# Patient Record
Sex: Female | Born: 1935 | Race: White | Hispanic: No | Smoking: Never smoker
Health system: Southern US, Community
[De-identification: ages and names within clinical notes are randomized; demographics above are authoritative.]

## PROBLEM LIST (undated history)

## (undated) DIAGNOSIS — M810 Age-related osteoporosis without current pathological fracture: Secondary | ICD-10-CM

## (undated) DIAGNOSIS — N83201 Unspecified ovarian cyst, right side: Secondary | ICD-10-CM

## (undated) DIAGNOSIS — I10 Essential (primary) hypertension: Secondary | ICD-10-CM

## (undated) DIAGNOSIS — Z8719 Personal history of other diseases of the digestive system: Secondary | ICD-10-CM

## (undated) DIAGNOSIS — E559 Vitamin D deficiency, unspecified: Secondary | ICD-10-CM

## (undated) DIAGNOSIS — M1990 Unspecified osteoarthritis, unspecified site: Secondary | ICD-10-CM

## (undated) DIAGNOSIS — K219 Gastro-esophageal reflux disease without esophagitis: Secondary | ICD-10-CM

## (undated) DIAGNOSIS — N200 Calculus of kidney: Secondary | ICD-10-CM

## (undated) DIAGNOSIS — R011 Cardiac murmur, unspecified: Secondary | ICD-10-CM

## (undated) DIAGNOSIS — D649 Anemia, unspecified: Secondary | ICD-10-CM

## (undated) DIAGNOSIS — I340 Nonrheumatic mitral (valve) insufficiency: Secondary | ICD-10-CM

## (undated) DIAGNOSIS — K5790 Diverticulosis of intestine, part unspecified, without perforation or abscess without bleeding: Secondary | ICD-10-CM

## (undated) DIAGNOSIS — E785 Hyperlipidemia, unspecified: Secondary | ICD-10-CM

## (undated) DIAGNOSIS — K5792 Diverticulitis of intestine, part unspecified, without perforation or abscess without bleeding: Secondary | ICD-10-CM

## (undated) DIAGNOSIS — R0602 Shortness of breath: Secondary | ICD-10-CM

## (undated) DIAGNOSIS — D132 Benign neoplasm of duodenum: Secondary | ICD-10-CM

## (undated) DIAGNOSIS — Z9289 Personal history of other medical treatment: Secondary | ICD-10-CM

## (undated) DIAGNOSIS — Z8711 Personal history of peptic ulcer disease: Secondary | ICD-10-CM

## (undated) HISTORY — DX: Vitamin D deficiency, unspecified: E55.9

## (undated) HISTORY — DX: Hyperlipidemia, unspecified: E78.5

## (undated) HISTORY — DX: Diverticulitis of intestine, part unspecified, without perforation or abscess without bleeding: K57.92

## (undated) HISTORY — DX: Essential (primary) hypertension: I10

## (undated) HISTORY — DX: Cardiac murmur, unspecified: R01.1

## (undated) HISTORY — DX: Personal history of other diseases of the digestive system: Z87.19

## (undated) HISTORY — DX: Gastro-esophageal reflux disease without esophagitis: K21.9

## (undated) HISTORY — DX: Personal history of peptic ulcer disease: Z87.11

## (undated) HISTORY — DX: Diverticulosis of intestine, part unspecified, without perforation or abscess without bleeding: K57.90

---

## 1979-12-22 HISTORY — PX: LEFT OOPHORECTOMY: SHX1961

## 1979-12-22 HISTORY — PX: ABDOMINAL HYSTERECTOMY: SHX81

## 2000-08-30 ENCOUNTER — Other Ambulatory Visit: Admission: RE | Admit: 2000-08-30 | Discharge: 2000-08-30 | Payer: Self-pay | Admitting: Obstetrics and Gynecology

## 2000-09-06 ENCOUNTER — Ambulatory Visit (HOSPITAL_COMMUNITY): Admission: RE | Admit: 2000-09-06 | Discharge: 2000-09-06 | Payer: Self-pay | Admitting: Obstetrics and Gynecology

## 2000-09-06 ENCOUNTER — Encounter: Payer: Self-pay | Admitting: Obstetrics and Gynecology

## 2005-10-06 ENCOUNTER — Ambulatory Visit: Payer: Self-pay | Admitting: Gastroenterology

## 2005-11-03 ENCOUNTER — Ambulatory Visit: Payer: Self-pay | Admitting: Gastroenterology

## 2005-11-09 ENCOUNTER — Ambulatory Visit: Payer: Self-pay | Admitting: Gastroenterology

## 2005-12-29 ENCOUNTER — Ambulatory Visit: Payer: Self-pay | Admitting: Gastroenterology

## 2006-01-11 ENCOUNTER — Ambulatory Visit: Payer: Self-pay | Admitting: Gastroenterology

## 2006-01-13 ENCOUNTER — Ambulatory Visit: Payer: Self-pay | Admitting: Cardiology

## 2009-03-19 ENCOUNTER — Telehealth: Payer: Self-pay | Admitting: Gastroenterology

## 2009-03-26 ENCOUNTER — Ambulatory Visit: Payer: Self-pay | Admitting: Gastroenterology

## 2009-03-26 DIAGNOSIS — R197 Diarrhea, unspecified: Secondary | ICD-10-CM

## 2009-03-26 DIAGNOSIS — Z87442 Personal history of urinary calculi: Secondary | ICD-10-CM | POA: Insufficient documentation

## 2009-03-26 DIAGNOSIS — K219 Gastro-esophageal reflux disease without esophagitis: Secondary | ICD-10-CM

## 2009-03-26 DIAGNOSIS — K5730 Diverticulosis of large intestine without perforation or abscess without bleeding: Secondary | ICD-10-CM | POA: Insufficient documentation

## 2009-03-26 LAB — CONVERTED CEMR LAB
ALT: 18 units/L (ref 0–35)
AST: 20 units/L (ref 0–37)
Albumin: 4.1 g/dL (ref 3.5–5.2)
Alkaline Phosphatase: 83 units/L (ref 39–117)
BUN: 8 mg/dL (ref 6–23)
Basophils Absolute: 0.1 10*3/uL (ref 0.0–0.1)
Basophils Relative: 0.9 % (ref 0.0–3.0)
Bilirubin, Direct: 0.1 mg/dL (ref 0.0–0.3)
CO2: 30 meq/L (ref 19–32)
Calcium: 8.9 mg/dL (ref 8.4–10.5)
Chloride: 109 meq/L (ref 96–112)
Creatinine, Ser: 0.5 mg/dL (ref 0.4–1.2)
Eosinophils Absolute: 0.2 10*3/uL (ref 0.0–0.7)
Eosinophils Relative: 2.8 % (ref 0.0–5.0)
Ferritin: 51.9 ng/mL (ref 10.0–291.0)
Folate: 20 ng/mL
GFR calc non Af Amer: 129.53 mL/min (ref >60)
Glucose, Bld: 99 mg/dL (ref 70–99)
HCT: 37.4 % (ref 36.0–46.0)
Hemoglobin: 12.9 g/dL (ref 12.0–15.0)
Iron: 85 ug/dL (ref 42–145)
Lymphocytes Relative: 24.5 % (ref 12.0–46.0)
Lymphs Abs: 1.4 10*3/uL (ref 0.7–4.0)
MCHC: 34.4 g/dL (ref 30.0–36.0)
MCV: 92.7 fL (ref 78.0–100.0)
Monocytes Absolute: 0.3 10*3/uL (ref 0.1–1.0)
Monocytes Relative: 5.3 % (ref 3.0–12.0)
Neutro Abs: 3.6 10*3/uL (ref 1.4–7.7)
Neutrophils Relative %: 66.5 % (ref 43.0–77.0)
Platelets: 303 10*3/uL (ref 150.0–400.0)
Potassium: 3.6 meq/L (ref 3.5–5.1)
RBC: 4.04 M/uL (ref 3.87–5.11)
RDW: 11.7 % (ref 11.5–14.6)
Saturation Ratios: 27.7 % (ref 20.0–50.0)
Sodium: 143 meq/L (ref 135–145)
TSH: 1.45 microintl units/mL (ref 0.35–5.50)
Total Bilirubin: 0.7 mg/dL (ref 0.3–1.2)
Total Protein: 7.1 g/dL (ref 6.0–8.3)
Transferrin: 219.4 mg/dL (ref 212.0–360.0)
Vitamin B-12: 402 pg/mL (ref 211–911)
WBC: 5.6 10*3/uL (ref 4.5–10.5)

## 2009-04-08 DIAGNOSIS — E559 Vitamin D deficiency, unspecified: Secondary | ICD-10-CM | POA: Insufficient documentation

## 2009-04-08 LAB — CONVERTED CEMR LAB
Tissue Transglutaminase Ab, IgA: 0.2 units (ref <7)
Vit D, 25-Hydroxy: 17 ng/mL — ABNORMAL LOW (ref 30–89)

## 2009-04-09 ENCOUNTER — Ambulatory Visit: Payer: Self-pay | Admitting: Gastroenterology

## 2009-04-10 ENCOUNTER — Encounter: Payer: Self-pay | Admitting: Gastroenterology

## 2009-06-25 ENCOUNTER — Telehealth: Payer: Self-pay | Admitting: Gastroenterology

## 2011-02-13 ENCOUNTER — Telehealth (INDEPENDENT_AMBULATORY_CARE_PROVIDER_SITE_OTHER): Payer: Self-pay | Admitting: *Deleted

## 2011-02-17 NOTE — Progress Notes (Signed)
  Phone Note Other Incoming   Request: Send information Summary of Call: Request for records received from [facility]. Records faxed to [phone]

## 2011-12-22 DIAGNOSIS — N83201 Unspecified ovarian cyst, right side: Secondary | ICD-10-CM

## 2011-12-22 HISTORY — DX: Unspecified ovarian cyst, right side: N83.201

## 2012-01-06 DIAGNOSIS — E785 Hyperlipidemia, unspecified: Secondary | ICD-10-CM | POA: Diagnosis not present

## 2012-01-06 DIAGNOSIS — I10 Essential (primary) hypertension: Secondary | ICD-10-CM | POA: Diagnosis not present

## 2012-04-01 DIAGNOSIS — Z0000 Encounter for general adult medical examination without abnormal findings: Secondary | ICD-10-CM | POA: Diagnosis not present

## 2012-04-01 DIAGNOSIS — I10 Essential (primary) hypertension: Secondary | ICD-10-CM | POA: Diagnosis not present

## 2012-04-01 DIAGNOSIS — E785 Hyperlipidemia, unspecified: Secondary | ICD-10-CM | POA: Diagnosis not present

## 2012-04-01 DIAGNOSIS — R350 Frequency of micturition: Secondary | ICD-10-CM | POA: Diagnosis not present

## 2012-05-26 DIAGNOSIS — D509 Iron deficiency anemia, unspecified: Secondary | ICD-10-CM | POA: Diagnosis not present

## 2012-05-26 DIAGNOSIS — Z01419 Encounter for gynecological examination (general) (routine) without abnormal findings: Secondary | ICD-10-CM | POA: Diagnosis not present

## 2012-05-26 DIAGNOSIS — Z0000 Encounter for general adult medical examination without abnormal findings: Secondary | ICD-10-CM | POA: Diagnosis not present

## 2012-06-10 DIAGNOSIS — Z1231 Encounter for screening mammogram for malignant neoplasm of breast: Secondary | ICD-10-CM | POA: Diagnosis not present

## 2012-06-14 DIAGNOSIS — R928 Other abnormal and inconclusive findings on diagnostic imaging of breast: Secondary | ICD-10-CM | POA: Diagnosis not present

## 2012-06-15 ENCOUNTER — Encounter: Payer: Self-pay | Admitting: *Deleted

## 2012-06-28 ENCOUNTER — Encounter: Payer: Self-pay | Admitting: Gastroenterology

## 2012-06-28 ENCOUNTER — Ambulatory Visit: Payer: Self-pay | Admitting: Gastroenterology

## 2012-07-21 ENCOUNTER — Encounter: Payer: Self-pay | Admitting: *Deleted

## 2012-07-22 DIAGNOSIS — I10 Essential (primary) hypertension: Secondary | ICD-10-CM | POA: Diagnosis not present

## 2012-07-22 DIAGNOSIS — E785 Hyperlipidemia, unspecified: Secondary | ICD-10-CM | POA: Diagnosis not present

## 2012-07-25 ENCOUNTER — Encounter: Payer: Self-pay | Admitting: Gastroenterology

## 2012-07-25 ENCOUNTER — Ambulatory Visit (INDEPENDENT_AMBULATORY_CARE_PROVIDER_SITE_OTHER): Payer: Medicare Other | Admitting: Gastroenterology

## 2012-07-25 VITALS — BP 140/74 | HR 72 | Ht 62.0 in | Wt 142.0 lb

## 2012-07-25 DIAGNOSIS — R195 Other fecal abnormalities: Secondary | ICD-10-CM

## 2012-07-25 DIAGNOSIS — K219 Gastro-esophageal reflux disease without esophagitis: Secondary | ICD-10-CM

## 2012-07-25 MED ORDER — INTEGRA F 125-1 MG PO CAPS: 1.0000 | ORAL_CAPSULE | Freq: Every day | ORAL | Status: DC

## 2012-07-25 MED ORDER — MOVIPREP 100 G PO SOLR: ORAL | Status: DC

## 2012-07-25 NOTE — Patient Instructions (Addendum)
You have been scheduled for an endoscopy and colonoscopy with propofol. Please follow the written instructions given to you at your visit today. Please pick up your prep at the pharmacy within the next 1-3 days. If you use inhalers (even only as needed), please bring them with you on the day of your procedure. We have sent the following medications to your pharmacy for you to pick up at your convenience: Integra (Iron) 1 capsule daily We will request labwork from Dr Ambrose Mantle CC: Dr Ambrose Mantle

## 2012-07-25 NOTE — Progress Notes (Signed)
History of Present Illness:  This is a very nice asymptomatic 76 year old Caucasian female referred through the courtesy of Dr. Tracey Harries for evaluation of guaiac positive stool on routine yearly exam. Apparently her hemoglobin was 9 consistent with iron deficiency. The patient denies melena, hematochezia, but has been on Prilosec for many years because of acid reflux. Currently she denies dyspepsia, burning substernal chest pain or dysphagia. Previous endoscopy and colonoscopy was negative in 2006 except for mild diverticulosis and a 5 cm hiatal hernia.. The patient is not on salicylates or NSAIDs. Family history is noncontributory.  I have reviewed this patient's present history, medical and surgical past history, allergies and medications.     ROS: The remainder of the 10 point ROS is negative     Physical Exam: Blood pressure 140/74, pulse 72 and regular, and weight 142 pounds with a BMI of 25.97. General well developed well nourished patient in no acute distress, appearing their stated age Eyes PERRLA, no icterus, fundoscopic exam per opthamologist Skin no lesions noted Neck supple, no adenopathy, no thyroid enlargement, no tenderness Chest clear to percussion and auscultation Heart no significant murmurs, gallops or rubs noted Abdomen no hepatosplenomegaly masses or tenderness, BS normal.  Extremities no acute joint lesions, edema, phlebitis or evidence of cellulitis. Neurologic patient oriented x 3, cranial nerves intact, no focal neurologic deficits noted. Psychological mental status normal and normal affect.  Assessment and plan: Current deficiency anemia with guaiac positive stool, rule out colonic polyposis and/or colon cancer versus occult upper GI lesion. I placed her on oral iron supplementation and we'll schedule outpatient endoscopy and colonoscopy with propofol sedation and nurse anesthesia.  No diagnosis found.

## 2012-07-29 ENCOUNTER — Encounter: Payer: Self-pay | Admitting: Gastroenterology

## 2012-07-29 ENCOUNTER — Ambulatory Visit (AMBULATORY_SURGERY_CENTER): Payer: Medicare Other | Admitting: Gastroenterology

## 2012-07-29 VITALS — BP 136/65 | HR 75 | Temp 96.6°F | Resp 18 | Ht 61.0 in | Wt 139.0 lb

## 2012-07-29 DIAGNOSIS — Z1211 Encounter for screening for malignant neoplasm of colon: Secondary | ICD-10-CM | POA: Diagnosis not present

## 2012-07-29 DIAGNOSIS — I10 Essential (primary) hypertension: Secondary | ICD-10-CM | POA: Diagnosis not present

## 2012-07-29 DIAGNOSIS — K219 Gastro-esophageal reflux disease without esophagitis: Secondary | ICD-10-CM

## 2012-07-29 DIAGNOSIS — R195 Other fecal abnormalities: Secondary | ICD-10-CM

## 2012-07-29 DIAGNOSIS — K5730 Diverticulosis of large intestine without perforation or abscess without bleeding: Secondary | ICD-10-CM | POA: Diagnosis not present

## 2012-07-29 DIAGNOSIS — D509 Iron deficiency anemia, unspecified: Secondary | ICD-10-CM

## 2012-07-29 DIAGNOSIS — K5732 Diverticulitis of large intestine without perforation or abscess without bleeding: Secondary | ICD-10-CM | POA: Diagnosis not present

## 2012-07-29 MED ORDER — SODIUM CHLORIDE 0.9 % IV SOLN: 500.0000 mL | INTRAVENOUS | Status: DC

## 2012-07-29 NOTE — Progress Notes (Signed)
Patient did not experience any of the following events: a burn prior to discharge; a fall within the facility; wrong site/side/patient/procedure/implant event; or a hospital transfer or hospital admission upon discharge from the facility. (G8907) Patient did not have preoperative order for IV antibiotic SSI prophylaxis. (G8918)  

## 2012-07-29 NOTE — Op Note (Signed)
[facility] [address] [place], [place]  [address]  COLONOSCOPY PROCEDURE REPORT  PATIENT:  Christine, Morse  MR#:  [id number] BIRTHDATE:  19-May-1936, 75 yrs. old  GENDER:  female ENDOSCOPIST:  Vania Rea. Jarold Motto, MD, [facility] REF. BY: PROCEDURE DATE:  07/29/2012 PROCEDURE:  Average-risk screening colonoscopy G0121 ASA CLASS:  Class II INDICATIONS:  Iron deficiency anemia MEDICATIONS:   propofol (Diprivan) 120 mg IV  DESCRIPTION OF PROCEDURE:   After the risks and benefits and of the procedure were explained, informed consent was obtained. Digital rectal exam was performed and revealed no abnormalities and external hemorrhoids.   The LB CF-[id number] endoscope was introduced through the anus and advanced to the cecum, which was identified by both the appendix and ileocecal valve.  The quality of the prep was excellent, using MoviPrep.  The instrument was then slowly withdrawn as the colon was fully examined.<<PROCEDUREIMAGES>>  FINDINGS:  There were mild diverticular changes in left colon. diverticulosis was found.  External Hemorrhoids were found.  No polyps or cancers were seen.  This was otherwise a normal examination of the colon.   Retroflexed views in the rectum revealed no abnormalities.    The scope was then withdrawn from the patient and the procedure completed.  COMPLICATIONS:  None ENDOSCOPIC IMPRESSION: 1) Diverticulosis,mild,left sided diverticulosis 2) External hemorrhoids 3) No polyps or cancers 4) Otherwise normal examination RECOMMENDATIONS: 1) Capsule endoscopy PATIENT COULD NOT TOLERATE ENDOSCOPY PER SEVERE UPPER AIRWAY SPASM.  REPEAT EXAM:  No  ______________________________ Vania Rea. Jarold Motto, MD, [facility]  CC:  n. eSIGNED:   Vania Rea. Royalty Domagala at 07/29/2012 03:20 PM  [place], [id number]

## 2012-07-29 NOTE — Patient Instructions (Addendum)
YOU HAD AN ENDOSCOPIC PROCEDURE TODAY AT THE [facility]: Refer to the procedure report that was given to you for any specific questions about what was found during the examination.  If the procedure report does not answer your questions, please call your gastroenterologist to clarify.  If you requested that your care partner not be given the details of your procedure findings, then the procedure report has been included in a sealed envelope for you to review at your convenience later.  YOU SHOULD EXPECT: Some feelings of bloating in the abdomen. Passage of more gas than usual.  Walking can help get rid of the air that was put into your GI tract during the procedure and reduce the bloating. If you had a lower endoscopy (such as a colonoscopy or flexible sigmoidoscopy) you may notice spotting of blood in your stool or on the toilet paper. If you underwent a bowel prep for your procedure, then you may not have a normal bowel movement for a few days.  DIET: Your first meal following the procedure should be a light meal and then it is ok to progress to your normal diet.  A half-sandwich or bowl of soup is an example of a good first meal.  Heavy or fried foods are harder to digest and may make you feel nauseous or bloated.  Likewise meals heavy in dairy and vegetables can cause extra gas to form and this can also increase the bloating.  Drink plenty of fluids but you should avoid alcoholic beverages for 24 hours.  ACTIVITY: Your care partner should take you home directly after the procedure.  You should plan to take it easy, moving slowly for the rest of the day.  You can resume normal activity the day after the procedure however you should NOT DRIVE or use heavy machinery for 24 hours (because of the sedation medicines used during the test).    SYMPTOMS TO REPORT IMMEDIATELY: A gastroenterologist can be reached at any hour.  During normal business hours, 8:30 AM to 5:00 PM Monday through Friday,  call [phone].  After hours and on weekends, please call the GI answering service at [phone] who will take a message and have the physician on call contact you.   Following lower endoscopy (colonoscopy or flexible sigmoidoscopy):  Excessive amounts of blood in the stool  Significant tenderness or worsening of abdominal pains  Swelling of the abdomen that is new, acute  Fever of 100F or higher   FOLLOW UP: Our staff will call the home number listed on your records the next business day following your procedure to check on you and address any questions or concerns that you may have at that time regarding the information given to you following your procedure. This is a courtesy call and so if there is no answer at the home number and we have not heard from you through the emergency physician on call, we will assume that you have returned to your regular daily activities without incident.  SIGNATURES/CONFIDENTIALITY: You and/or your care partner have signed paperwork which will be entered into your electronic medical record.  These signatures attest to the fact that that the information above on your After Visit Summary has been reviewed and is understood.  Full responsibility of the confidentiality of this discharge information lies with you and/or your care-partner.   Dr. Norval Gable nurse will call you next week to set up the endoscopy

## 2012-08-01 ENCOUNTER — Telehealth: Payer: Self-pay | Admitting: *Deleted

## 2012-08-01 NOTE — Telephone Encounter (Signed)
  Follow up Call-  Call back number 07/29/2012  Post procedure Call Back phone  # [phone]  Permission to leave phone message Yes     Patient questions:  Do you have a fever, pain , or abdominal swelling? no Pain Score  0 *  Have you tolerated food without any problems? yes  Have you been able to return to your normal activities? yes  Do you have any questions about your discharge instructions: Diet   no Medications  no Follow up visit  no  Do you have questions or concerns about your Care? no  Actions: * If pain score is 4 or above: No action needed, pain <4.

## 2012-08-02 ENCOUNTER — Telehealth: Payer: Self-pay | Admitting: *Deleted

## 2012-08-02 ENCOUNTER — Other Ambulatory Visit: Payer: Self-pay | Admitting: *Deleted

## 2012-08-02 DIAGNOSIS — D509 Iron deficiency anemia, unspecified: Secondary | ICD-10-CM

## 2012-08-02 NOTE — Telephone Encounter (Signed)
Spoke with pt to schedule her for Capsule Endo teaching. Explained it might be next week before we get more test supplies; pt stated understanding and will come in 08/05/12.

## 2012-08-05 ENCOUNTER — Telehealth: Payer: Self-pay

## 2012-08-05 NOTE — Telephone Encounter (Signed)
Pt is scheduled for capsule endo 08/10/12. Pt takes iron supplements, do you want her to stop this prior to the capsule endo? Please advise.

## 2012-08-05 NOTE — Telephone Encounter (Signed)
YES

## 2012-08-05 NOTE — Telephone Encounter (Signed)
Spoke with pt and she knows to stop her Iron for the capsule endoscopy.

## 2012-08-10 ENCOUNTER — Ambulatory Visit (INDEPENDENT_AMBULATORY_CARE_PROVIDER_SITE_OTHER): Payer: Medicare Other | Admitting: Internal Medicine

## 2012-08-10 DIAGNOSIS — K921 Melena: Secondary | ICD-10-CM

## 2012-08-10 DIAGNOSIS — D508 Other iron deficiency anemias: Secondary | ICD-10-CM | POA: Diagnosis not present

## 2012-08-10 NOTE — Progress Notes (Signed)
Pt in at 0800am this morning and she stated she had completed the prep, had BMs and has been NPO since midnight. She swallowed the pill w/o difficulty, lay on her side for 30 minutes with no distress per pt. She was given instructions for diet with times throughout today, restrictions our number for questions or problems, etc and she stated understanding. Pt back at 1610pm and capsule belt and module removed from pt. Pt reported no problems and was told we hoped to have results in a couple of weeks and she stated understanding. Capsule LOT  [id number]      25

## 2012-08-18 ENCOUNTER — Encounter: Payer: Self-pay | Admitting: Internal Medicine

## 2012-10-26 DIAGNOSIS — E039 Hypothyroidism, unspecified: Secondary | ICD-10-CM | POA: Diagnosis not present

## 2012-10-26 DIAGNOSIS — E559 Vitamin D deficiency, unspecified: Secondary | ICD-10-CM | POA: Diagnosis not present

## 2012-10-26 DIAGNOSIS — Z6826 Body mass index (BMI) 26.0-26.9, adult: Secondary | ICD-10-CM | POA: Diagnosis not present

## 2012-10-26 DIAGNOSIS — E785 Hyperlipidemia, unspecified: Secondary | ICD-10-CM | POA: Diagnosis not present

## 2012-10-26 DIAGNOSIS — H40009 Preglaucoma, unspecified, unspecified eye: Secondary | ICD-10-CM | POA: Diagnosis not present

## 2012-10-26 DIAGNOSIS — R5383 Other fatigue: Secondary | ICD-10-CM | POA: Diagnosis not present

## 2013-01-27 DIAGNOSIS — E785 Hyperlipidemia, unspecified: Secondary | ICD-10-CM | POA: Diagnosis not present

## 2013-01-27 DIAGNOSIS — I10 Essential (primary) hypertension: Secondary | ICD-10-CM | POA: Diagnosis not present

## 2013-01-27 DIAGNOSIS — M810 Age-related osteoporosis without current pathological fracture: Secondary | ICD-10-CM | POA: Diagnosis not present

## 2013-01-27 DIAGNOSIS — R0602 Shortness of breath: Secondary | ICD-10-CM | POA: Diagnosis not present

## 2013-02-22 DIAGNOSIS — I10 Essential (primary) hypertension: Secondary | ICD-10-CM | POA: Diagnosis not present

## 2013-02-22 DIAGNOSIS — E785 Hyperlipidemia, unspecified: Secondary | ICD-10-CM | POA: Diagnosis not present

## 2013-02-22 DIAGNOSIS — M810 Age-related osteoporosis without current pathological fracture: Secondary | ICD-10-CM | POA: Diagnosis not present

## 2013-05-01 DIAGNOSIS — R42 Dizziness and giddiness: Secondary | ICD-10-CM | POA: Diagnosis not present

## 2013-05-01 DIAGNOSIS — Z0000 Encounter for general adult medical examination without abnormal findings: Secondary | ICD-10-CM | POA: Diagnosis not present

## 2013-05-01 DIAGNOSIS — Z9181 History of falling: Secondary | ICD-10-CM | POA: Diagnosis not present

## 2013-06-16 DIAGNOSIS — Z1231 Encounter for screening mammogram for malignant neoplasm of breast: Secondary | ICD-10-CM | POA: Diagnosis not present

## 2013-08-02 DIAGNOSIS — Z6825 Body mass index (BMI) 25.0-25.9, adult: Secondary | ICD-10-CM | POA: Diagnosis not present

## 2013-08-02 DIAGNOSIS — E785 Hyperlipidemia, unspecified: Secondary | ICD-10-CM | POA: Diagnosis not present

## 2013-08-02 DIAGNOSIS — I10 Essential (primary) hypertension: Secondary | ICD-10-CM | POA: Diagnosis not present

## 2013-08-02 DIAGNOSIS — M810 Age-related osteoporosis without current pathological fracture: Secondary | ICD-10-CM | POA: Diagnosis not present

## 2013-08-10 DIAGNOSIS — N390 Urinary tract infection, site not specified: Secondary | ICD-10-CM | POA: Diagnosis not present

## 2013-08-10 DIAGNOSIS — M539 Dorsopathy, unspecified: Secondary | ICD-10-CM | POA: Diagnosis not present

## 2013-08-10 DIAGNOSIS — M25519 Pain in unspecified shoulder: Secondary | ICD-10-CM | POA: Diagnosis not present

## 2013-08-11 DIAGNOSIS — M25519 Pain in unspecified shoulder: Secondary | ICD-10-CM | POA: Diagnosis not present

## 2013-08-11 DIAGNOSIS — M542 Cervicalgia: Secondary | ICD-10-CM | POA: Diagnosis not present

## 2013-08-11 DIAGNOSIS — M4802 Spinal stenosis, cervical region: Secondary | ICD-10-CM | POA: Diagnosis not present

## 2013-08-11 DIAGNOSIS — M47812 Spondylosis without myelopathy or radiculopathy, cervical region: Secondary | ICD-10-CM | POA: Diagnosis not present

## 2013-08-17 DIAGNOSIS — R351 Nocturia: Secondary | ICD-10-CM | POA: Diagnosis not present

## 2013-08-17 DIAGNOSIS — M542 Cervicalgia: Secondary | ICD-10-CM | POA: Diagnosis not present

## 2013-08-17 DIAGNOSIS — N3090 Cystitis, unspecified without hematuria: Secondary | ICD-10-CM | POA: Diagnosis not present

## 2013-08-17 DIAGNOSIS — R350 Frequency of micturition: Secondary | ICD-10-CM | POA: Diagnosis not present

## 2013-08-17 DIAGNOSIS — N390 Urinary tract infection, site not specified: Secondary | ICD-10-CM | POA: Diagnosis not present

## 2013-08-17 DIAGNOSIS — M4120 Other idiopathic scoliosis, site unspecified: Secondary | ICD-10-CM | POA: Diagnosis not present

## 2013-08-23 ENCOUNTER — Ambulatory Visit (INDEPENDENT_AMBULATORY_CARE_PROVIDER_SITE_OTHER): Payer: Medicare Other | Admitting: Cardiology

## 2013-08-23 ENCOUNTER — Encounter: Payer: Self-pay | Admitting: Cardiology

## 2013-08-23 VITALS — BP 136/60 | HR 92 | Ht 61.5 in | Wt 134.6 lb

## 2013-08-23 DIAGNOSIS — R0789 Other chest pain: Secondary | ICD-10-CM | POA: Diagnosis not present

## 2013-08-23 DIAGNOSIS — R0602 Shortness of breath: Secondary | ICD-10-CM

## 2013-08-23 MED ORDER — ASPIRIN EC 81 MG PO TBEC: 81.0000 mg | DELAYED_RELEASE_TABLET | Freq: Every day | ORAL | Status: DC

## 2013-08-23 NOTE — Patient Instructions (Addendum)
The current medical regimen is effective;  continue present plan and medications.  Your physician has requested that you have an echocardiogram. Echocardiography is a painless test that uses sound waves to create images of your heart. It provides your doctor with information about the size and shape of your heart and how well your heart's chambers and valves are working. This procedure takes approximately one hour. There are no restrictions for this procedure.  Please have blood work today. (BNP)  Follow up in 6 weeks with Dr Antoine Poche.

## 2013-08-23 NOTE — Progress Notes (Signed)
HPI Christine Morse is a 77 year old new patient who presents for evaluation of dyspnea on exertion.  She has come to me today on self-referral as I take care of her husband as well.  She reports she has been having significant dyspnea on exertion having to stop after 15 steps, less than 25 yards of walking on a slight incline, and with many daily activities.  This is been going on for the past 6-8 months but seems significantly worsened over the past 2.  She has never had anything like this before.  She does not have any associated chest pain but does report a significant chest pressure over her upper chest bilaterally radiating to her bilateral anterior shoulders.  She will occasionally have diaphoresis with these episodes but denies any nausea or vomiting; denys any dizziness or orthostasis.  She reports the pain will occasionally radiate into her bilateral jaws and neck.  She has no pain radiating down her arms.  Rest is the only thing that seems to alleviate her symptoms.  She has not tried any medications.   She's never had any prior cardiac history. She has hypertension and  hyperlipidemia but no history of diabetes.  She is not on a statin but does take a daily aspirin.   No Known Allergies  Current Outpatient Prescriptions  Medication Sig Dispense Refill  . alendronate (FOSAMAX) 70 MG tablet Take 70 mg by mouth every 7 (seven) days. Take with a full glass of water on an empty stomach.      . Calcium Carbonate-Vitamin D (CALCIUM + D PO) Take 1 capsule by mouth daily.      Marland Kitchen etodolac (LODINE) 400 MG tablet Take 400 mg by mouth 2 (two) times daily.      . fenofibrate 54 MG tablet Take 54 mg by mouth daily.      Marland Kitchen lisinopril (PRINIVIL,ZESTRIL) 5 MG tablet Take 5 mg by mouth daily. Take 3 tablets daily      . meloxicam (MOBIC) 15 MG tablet Take 15 mg by mouth daily.      . Omeprazole (PRILOSEC PO) Take 1 tablet by mouth daily.      Marland Kitchen oxybutynin (DITROPAN-XL) 5 MG 24 hr tablet Take 5 mg by mouth  daily.       No current facility-administered medications for this visit.    Past Medical History  Diagnosis Date  . Diverticulosis   . History of nephrolithiasis   . Diverticulitis   . Hypertension   . Hyperlipidemia   . GERD (gastroesophageal reflux disease)     Past Surgical History  Procedure Laterality Date  . Abdominal hysterectomy    . Left oophorectomy      ROS: otherwise per history of present illness PHYSICAL EXAM GENERAL:  Elderly Caucasian female,nicely dressed and able to participate in exam fully HEENT:  Pupils equal round and reactive, fundi not visualized, oral mucosa unremarkable NECK:  No jugular venous distention, waveform within normal limits, carotid upstroke brisk and symmetric, no bruits but murmur that radiates as below, no thyromegaly LYMPHATICS:  No cervical, inguinal adenopathy LUNGS:  Clear to auscultation bilaterally CHEST:  Unremarkable HEART:  PMI not displaced or sustained, S1 and S2 within normal limits, no S3, no S4, no clicks, no rubs, 2-3/6 systolic ejection murmur heard best at the right upper sternal border.  It is not affected by Valsalva, or leaning forward.  ABD:  Flat, positive bowel sounds normal in frequency in pitch, no bruits, no rebound, no guarding, no midline pulsatile  mass, no hepatomegaly, no splenomegaly EXT:  2 plus pulses throughout, no edema, no cyanosis no clubbing, diffuse muscle wasting  BP 136/60  Pulse 92  Ht 5' 1.5" (1.562 m)  Wt 134 lb 9.6 oz (61.054 kg)  BMI 25.02 kg/m2  EKG: NSR @ 92, incomplete RBBB, no ischemia  ASSESSMENT AND PLAN  Dyspnea on Exertion with Murmur, likely Aortic Stenosis:  Risk stratification: TSH, proBNP, Lipid profile, A1c Needs ECHO.  If mild or moderate aortic stenosis will need further ischemic evaluation; if severe or critical symptoms likely due to AS.  Will make further treatment decision following results of echo Continue daily ASA 81mg   HTN -  Well controlled today.  Continue  lisinopril  Hyperlipidemia - Check lipid profile.  Will probably need to be on statin given likely elevated ASCVD risk.    History and all data above reviewed.  Patient examined.  I agree with the findings as above.  As above she presents with progressive dyspnea.  Some chest tightness with activity.  The patient exam reveals COR:RRR with 3/6 systolic murmur at the right upper sternal border, no diastolic murmurs.  ,  Lungs: Clear  ,  Abd: RRR, Ext Positive bowel sounds, no rebound no guarding  .  All available labs, radiology testing, previous records reviewed. Agree with documented assessment and plan. Start with a BNP and echo. Probable AS.  Pending this we will consider further ischemia work up.    Rollene Rotunda  7:23 AM  08/24/2013

## 2013-09-07 ENCOUNTER — Ambulatory Visit (HOSPITAL_COMMUNITY): Payer: Medicare Other | Attending: Cardiology | Admitting: Radiology

## 2013-09-07 DIAGNOSIS — R0602 Shortness of breath: Secondary | ICD-10-CM

## 2013-09-07 DIAGNOSIS — R0789 Other chest pain: Secondary | ICD-10-CM

## 2013-09-07 DIAGNOSIS — R0609 Other forms of dyspnea: Secondary | ICD-10-CM | POA: Insufficient documentation

## 2013-09-07 DIAGNOSIS — E785 Hyperlipidemia, unspecified: Secondary | ICD-10-CM | POA: Insufficient documentation

## 2013-09-07 DIAGNOSIS — I4510 Unspecified right bundle-branch block: Secondary | ICD-10-CM | POA: Diagnosis not present

## 2013-09-07 DIAGNOSIS — R0989 Other specified symptoms and signs involving the circulatory and respiratory systems: Secondary | ICD-10-CM | POA: Insufficient documentation

## 2013-09-07 DIAGNOSIS — I10 Essential (primary) hypertension: Secondary | ICD-10-CM | POA: Diagnosis not present

## 2013-09-07 NOTE — Progress Notes (Signed)
Echocardiogram performed.  

## 2013-09-26 ENCOUNTER — Other Ambulatory Visit: Payer: Self-pay | Admitting: *Deleted

## 2013-10-03 ENCOUNTER — Emergency Department (HOSPITAL_COMMUNITY): Payer: Medicare Other

## 2013-10-03 ENCOUNTER — Telehealth: Payer: Self-pay | Admitting: *Deleted

## 2013-10-03 ENCOUNTER — Ambulatory Visit (HOSPITAL_BASED_OUTPATIENT_CLINIC_OR_DEPARTMENT_OTHER): Payer: Medicare Other | Admitting: Radiology

## 2013-10-03 ENCOUNTER — Inpatient Hospital Stay (HOSPITAL_COMMUNITY)
Admission: EM | Admit: 2013-10-03 | Discharge: 2013-10-05 | DRG: 379 | Disposition: A | Discharge status: Home / Self Care | Payer: Medicare Other | Attending: Internal Medicine | Admitting: Internal Medicine

## 2013-10-03 ENCOUNTER — Ambulatory Visit (INDEPENDENT_AMBULATORY_CARE_PROVIDER_SITE_OTHER): Payer: Medicare Other | Admitting: Cardiovascular Disease

## 2013-10-03 ENCOUNTER — Encounter: Payer: Self-pay | Admitting: *Deleted

## 2013-10-03 ENCOUNTER — Encounter: Payer: Self-pay | Admitting: Cardiovascular Disease

## 2013-10-03 ENCOUNTER — Other Ambulatory Visit: Payer: Self-pay

## 2013-10-03 ENCOUNTER — Encounter (HOSPITAL_COMMUNITY): Payer: Self-pay | Admitting: Emergency Medicine

## 2013-10-03 VITALS — BP 132/59 | HR 74 | Ht 61.5 in | Wt 134.0 lb

## 2013-10-03 DIAGNOSIS — R9439 Abnormal result of other cardiovascular function study: Secondary | ICD-10-CM

## 2013-10-03 DIAGNOSIS — R197 Diarrhea, unspecified: Secondary | ICD-10-CM

## 2013-10-03 DIAGNOSIS — Z7982 Long term (current) use of aspirin: Secondary | ICD-10-CM | POA: Diagnosis not present

## 2013-10-03 DIAGNOSIS — E785 Hyperlipidemia, unspecified: Secondary | ICD-10-CM | POA: Diagnosis present

## 2013-10-03 DIAGNOSIS — R079 Chest pain, unspecified: Secondary | ICD-10-CM

## 2013-10-03 DIAGNOSIS — Z87891 Personal history of nicotine dependence: Secondary | ICD-10-CM | POA: Diagnosis not present

## 2013-10-03 DIAGNOSIS — K219 Gastro-esophageal reflux disease without esophagitis: Secondary | ICD-10-CM | POA: Diagnosis present

## 2013-10-03 DIAGNOSIS — R0789 Other chest pain: Secondary | ICD-10-CM | POA: Insufficient documentation

## 2013-10-03 DIAGNOSIS — K2960 Other gastritis without bleeding: Secondary | ICD-10-CM | POA: Diagnosis present

## 2013-10-03 DIAGNOSIS — Z79899 Other long term (current) drug therapy: Secondary | ICD-10-CM | POA: Diagnosis not present

## 2013-10-03 DIAGNOSIS — D649 Anemia, unspecified: Secondary | ICD-10-CM | POA: Diagnosis not present

## 2013-10-03 DIAGNOSIS — Z01810 Encounter for preprocedural cardiovascular examination: Secondary | ICD-10-CM | POA: Diagnosis not present

## 2013-10-03 DIAGNOSIS — K922 Gastrointestinal hemorrhage, unspecified: Secondary | ICD-10-CM | POA: Diagnosis present

## 2013-10-03 DIAGNOSIS — K259 Gastric ulcer, unspecified as acute or chronic, without hemorrhage or perforation: Secondary | ICD-10-CM | POA: Diagnosis not present

## 2013-10-03 DIAGNOSIS — R0609 Other forms of dyspnea: Secondary | ICD-10-CM | POA: Insufficient documentation

## 2013-10-03 DIAGNOSIS — D500 Iron deficiency anemia secondary to blood loss (chronic): Secondary | ICD-10-CM | POA: Diagnosis not present

## 2013-10-03 DIAGNOSIS — I10 Essential (primary) hypertension: Secondary | ICD-10-CM | POA: Diagnosis present

## 2013-10-03 DIAGNOSIS — K5730 Diverticulosis of large intestine without perforation or abscess without bleeding: Secondary | ICD-10-CM

## 2013-10-03 DIAGNOSIS — M810 Age-related osteoporosis without current pathological fracture: Secondary | ICD-10-CM | POA: Diagnosis present

## 2013-10-03 DIAGNOSIS — I2510 Atherosclerotic heart disease of native coronary artery without angina pectoris: Secondary | ICD-10-CM | POA: Diagnosis not present

## 2013-10-03 DIAGNOSIS — R5381 Other malaise: Secondary | ICD-10-CM | POA: Insufficient documentation

## 2013-10-03 DIAGNOSIS — R0989 Other specified symptoms and signs involving the circulatory and respiratory systems: Secondary | ICD-10-CM | POA: Insufficient documentation

## 2013-10-03 DIAGNOSIS — Z87442 Personal history of urinary calculi: Secondary | ICD-10-CM

## 2013-10-03 DIAGNOSIS — R0602 Shortness of breath: Secondary | ICD-10-CM

## 2013-10-03 DIAGNOSIS — E559 Vitamin D deficiency, unspecified: Secondary | ICD-10-CM

## 2013-10-03 DIAGNOSIS — R002 Palpitations: Secondary | ICD-10-CM | POA: Insufficient documentation

## 2013-10-03 HISTORY — DX: Anemia, unspecified: D64.9

## 2013-10-03 HISTORY — DX: Shortness of breath: R06.02

## 2013-10-03 HISTORY — DX: Age-related osteoporosis without current pathological fracture: M81.0

## 2013-10-03 HISTORY — DX: Personal history of other medical treatment: Z92.89

## 2013-10-03 HISTORY — DX: Calculus of kidney: N20.0

## 2013-10-03 HISTORY — DX: Unspecified ovarian cyst, right side: N83.201

## 2013-10-03 LAB — COMPREHENSIVE METABOLIC PANEL
ALT: 17 U/L (ref 0–35)
AST: 26 U/L (ref 0–37)
Alkaline Phosphatase: 53 U/L (ref 39–117)
CO2: 22 mEq/L (ref 19–32)
Calcium: 9.2 mg/dL (ref 8.4–10.5)
Chloride: 106 mEq/L (ref 96–112)
GFR calc Af Amer: >90 mL/min (ref >90)
GFR calc non Af Amer: 86 mL/min — ABNORMAL LOW (ref >90)
Glucose, Bld: 99 mg/dL (ref 70–99)
Potassium: 4.6 mEq/L (ref 3.5–5.1)
Sodium: 140 mEq/L (ref 135–145)
Total Protein: 7 g/dL (ref 6.0–8.3)

## 2013-10-03 LAB — CBC WITH DIFFERENTIAL/PLATELET
Basophils Absolute: 0.1 10*3/uL (ref 0.0–0.1)
Basophils Relative: 1 % (ref 0–1)
Eosinophils Absolute: 0.1 10*3/uL (ref 0.0–0.7)
Eosinophils Relative: 2 % (ref 0–5)
HCT: 18.8 % — ABNORMAL LOW (ref 36.0–46.0)
Hemoglobin: 5.1 g/dL — CL (ref 12.0–15.0)
Lymphocytes Relative: 17 % (ref 12–46)
Lymphs Abs: 0.9 10*3/uL (ref 0.7–4.0)
MCH: 17.6 pg — ABNORMAL LOW (ref 26.0–34.0)
Monocytes Relative: 8 % (ref 3–12)
Neutro Abs: 3.9 10*3/uL (ref 1.7–7.7)
Neutrophils Relative %: 72 % (ref 43–77)
RBC: 2.89 MIL/uL — ABNORMAL LOW (ref 3.87–5.11)
RDW: 18.9 % — ABNORMAL HIGH (ref 11.5–15.5)
WBC: 5.4 10*3/uL (ref 4.0–10.5)

## 2013-10-03 LAB — OCCULT BLOOD, POC DEVICE: Fecal Occult Bld: POSITIVE — AB

## 2013-10-03 LAB — APTT: aPTT: 21 seconds — ABNORMAL LOW (ref 24–37)

## 2013-10-03 LAB — BASIC METABOLIC PANEL
BUN: 10 mg/dL (ref 6–23)
CO2: 23 mEq/L (ref 19–32)
Chloride: 105 mEq/L (ref 96–112)
Creatinine, Ser: 0.5 mg/dL (ref 0.4–1.2)
Glucose, Bld: 104 mg/dL — ABNORMAL HIGH (ref 70–99)
Potassium: 3.8 mEq/L (ref 3.5–5.1)

## 2013-10-03 LAB — ABO/RH: ABO/RH(D): O POS

## 2013-10-03 LAB — CBC
HCT: 16.9 % — CL (ref 36.0–46.0)
Hemoglobin: 5 g/dL — CL (ref 12.0–15.0)
MCHC: 29.5 g/dL — ABNORMAL LOW (ref 30.0–36.0)
MCV: 60.3 fl — ABNORMAL LOW (ref 78.0–100.0)
RDW: 19.3 % — ABNORMAL HIGH (ref 11.5–14.6)

## 2013-10-03 MED ORDER — TECHNETIUM TC 99M SESTAMIBI GENERIC - CARDIOLITE
32.8000 | Freq: Once | INTRAVENOUS | Status: AC | PRN
  Administered 2013-10-03: 32.8 via INTRAVENOUS

## 2013-10-03 MED ORDER — METOPROLOL TARTRATE 25 MG PO TABS: 25.0000 mg | ORAL_TABLET | Freq: Two times a day (BID) | ORAL | Status: DC

## 2013-10-03 MED ORDER — TECHNETIUM TC 99M SESTAMIBI GENERIC - CARDIOLITE
11.0000 | Freq: Once | INTRAVENOUS | Status: AC | PRN
  Administered 2013-10-03: 11 via INTRAVENOUS

## 2013-10-03 MED ORDER — NITROGLYCERIN 0.4 MG SL SUBL: 0.4000 mg | SUBLINGUAL_TABLET | SUBLINGUAL | Status: DC | PRN

## 2013-10-03 NOTE — ED Notes (Signed)
Attempted to call report. Floor RN unable to accept report.  

## 2013-10-03 NOTE — Progress Notes (Signed)
[facility] SITE 3 NUCLEAR MED [address] [place], [place] [address] [phone]    Cardiology Nuclear Med Study  Christine Morse is a 77 y.o. female     MRN : [id number]     DOB: [birth year]  Procedure Date: 10/03/2013  Nuclear Med Background Indication for Stress Test:  Evaluation for Ischemia History:  2014 Echo: EF=65-70%, mild AS Cardiac Risk Factors: History of Smoking, Hypertension and Lipids  Symptoms:  Chest Pressure with and without Exertion (last episode of chest discomfort was this morning walking in, none now), Diaphoresis, DOE, Fatigue, Palpitations and Rapid HR   Nuclear Pre-Procedure Caffeine/Decaff Intake:  None NPO After: 10:00pm   Lungs:  Clear. O2 Sat: 99% on room air. IV 0.9% NS with Angio Cath:  22g  IV Site: L Antecubital  IV Started by:  Bonnita Levan, RN  Chest Size (in):  38 Cup Size: C  Height: 5' 1.5" (1.562 m)  Weight:  134 lb (60.782 kg)  BMI:  Body mass index is 24.91 kg/(m^2). Tech Comments:  EKG's and images taken to Dr. Elease Hashimoto (DOD), he saw patient and scheduled her for cath tomorrow, 10/04/13.    Nuclear Med Study 1 or 2 day study: 1 day  Stress Test Type:  Stress  Reading MD: Charlton Haws, MD  Order Authorizing Provider:  Rollene Rotunda, MD  Resting Radionuclide: Technetium 19m Sestamibi  Resting Radionuclide Dose: 11.0 mCi   Stress Radionuclide:  Technetium 74m Sestamibi  Stress Radionuclide Dose: 32.8 mCi           Stress Protocol Rest HR: 74 Stress HR: 136  Rest BP: 132/59 Stress BP: 161/47  Exercise Time (min): 3:00 METS: 4.6   Predicted Max HR: 146 bpm % Max HR: 93.15 bpm Rate Pressure Product: 46962   Dose of Adenosine (mg):  n/a Dose of Lexiscan: n/a mg  Dose of Atropine (mg): n/a Dose of Dobutamine: n/a mcg/kg/min (at max HR)  Stress Test Technologist: Smiley Houseman, CMA-N  Nuclear Technologist:  Doyne Keel, CNMT     Rest Procedure:  Myocardial perfusion imaging was performed at rest 45 minutes following  the intravenous administration of Technetium 23m Sestamibi.  Rest ECG: NSR - Normal EKG  Stress Procedure:  The patient exercised on the treadmill utilizing the Bruce Protocol for three minutes. The patient stopped due to fatigue and shortness of breath.  She denied any chest pain.  Technetium 56m Sestamibi was injected at peak exercise and myocardial perfusion imaging was performed after a brief delay.  Stress ECG: Significant ST abnormalities consistent with ischemia.  QPS Raw Data Images:  Normal; no motion artifact; normal heart/lung ratio. Stress Images:  There is decreased uptake in the anterior wall. Rest Images:  Normal homogeneous uptake in all areas of the myocardium. Subtraction (SDS):  These findings are consistent with ischemia. Transient Ischemic Dilatation (Normal <1.22):  n/a Lung/Heart Ratio (Normal <0.45):  0.39  Quantitative Gated Spect Images QGS EDV:  82 ml QGS ESV:  16 ml  Impression Exercise Capacity:  Poor exercise capacity. BP Response:  Normal blood pressure response. Clinical Symptoms:  No chest pain. ECG Impression:  Significant ST abnormalities consistent with ischemia. Comparison with Prior Nuclear Study: No previous nuclear study performed  Overall Impression:  Intermediate risk stress nuclear study Anteroseptal and anteroapical ischemia with positive ECG response to exerscis e.  LV Ejection Fraction: 80%.  LV Wall Motion:  NL LV Function; NL Wall Motion   Charlton Haws

## 2013-10-03 NOTE — ED Notes (Signed)
Pt states she was seen at her Cardiologist and had lab work and a stress test prior to a schedule cath tomorrow. Pt states that he was told by her MD office to come in the E.D. Because her hemoglobin was too low and they cancelled her cath tomorrow. Pt denies excessive bleeding. Pt states hemrrhoids but no more bleeding than normal. No falls, no injuries.

## 2013-10-03 NOTE — Telephone Encounter (Signed)
Dr Hochrein aware 

## 2013-10-03 NOTE — Patient Instructions (Addendum)
Your physician has requested that you have a cardiac catheterization. Cardiac catheterization is used to diagnose and/or treat various heart conditions. Doctors may recommend this procedure for a number of different reasons. The most common reason is to evaluate chest pain. Chest pain can be a symptom of coronary artery disease (CAD), and cardiac catheterization can show whether plaque is narrowing or blocking your heart's arteries. This procedure is also used to evaluate the valves, as well as measure the blood flow and oxygen levels in different parts of your heart. Please follow instruction sheet, as given.   Your physician has recommended you make the following change in your medication:  Start metoprolol 25 mg twice daily 12 hours apart nitroglycerine 0.4 mg under the tongue as needed for chest apin

## 2013-10-03 NOTE — ED Notes (Signed)
Dr. Gardner at bedside 

## 2013-10-03 NOTE — ED Notes (Signed)
MD at bedside. 

## 2013-10-03 NOTE — Telephone Encounter (Signed)
Pt informed heart cath will be cancelled. Per Dr Elease Hashimoto, SX may all be due to low hgb, Pt will go to [facility] ED for evaluation for anemia.  Pt was told not to take asa, and mobic, pt took today's dose.  Cath lab was called and LHC cancelled.

## 2013-10-03 NOTE — ED Provider Notes (Signed)
CSN: [id number]     Arrival date & time 10/03/13  1915 History   First MD Initiated Contact with Patient 10/03/13 2054     Chief Complaint  Patient presents with  . Abnormal Lab   (Consider location/radiation/quality/duration/timing/severity/associated sxs/prior Treatment) HPI Comments: 77 y/o female with history of diverticulosis presenting with anemia. She reports one month of dyspnea on exertion and generalized fatigue. She was seen by her Cardiologist who performed a stress test. She reports she was only able to walk for 3 minutes before having to stop due to fatigue. She denies chest pain. Her Hgb at that time was found to be 5. She denies recent bleeding including no melanotic stools.   The history is provided by the patient. No language interpreter was used.    Past Medical History  Diagnosis Date  . Diverticulosis   . History of nephrolithiasis   . Diverticulitis   . Hypertension   . Hyperlipidemia   . GERD (gastroesophageal reflux disease)    Past Surgical History  Procedure Laterality Date  . Abdominal hysterectomy    . Left oophorectomy     Family History  Problem Relation Age of Onset  . Colon cancer Neg Hx   . Ovarian cancer Sister    History  Substance Use Topics  . Smoking status: Never Smoker   . Smokeless tobacco: Never Used  . Alcohol Use: No   OB History   Grav Para Term Preterm Abortions TAB SAB Ect Mult Living                 Review of Systems  Constitutional: Positive for activity change and fatigue. Negative for fever.  Respiratory: Positive for chest tightness and shortness of breath. Negative for cough.   Cardiovascular: Negative for chest pain and palpitations.  Gastrointestinal: Negative for nausea, vomiting, abdominal pain, diarrhea, blood in stool, abdominal distention and anal bleeding.  Genitourinary: Negative for hematuria.  Skin: Positive for pallor.  All other systems reviewed and are negative.    Allergies  Review of patient's  allergies indicates no known allergies.  Home Medications   Current Outpatient Rx  Name  Route  Sig  Dispense  Refill  . alendronate (FOSAMAX) 70 MG tablet   Oral   Take 70 mg by mouth every 7 (seven) days. Take with a full glass of water on an empty stomach.         Marland Kitchen aspirin EC 81 MG tablet   Oral   Take 1 tablet (81 mg total) by mouth daily.         . Calcium Carbonate-Vitamin D (CALCIUM + D PO)   Oral   Take 1 capsule by mouth daily.         Marland Kitchen etodolac (LODINE) 400 MG tablet   Oral   Take 400 mg by mouth 2 (two) times daily.         . fenofibrate 54 MG tablet   Oral   Take 54 mg by mouth daily.         Marland Kitchen lisinopril (PRINIVIL,ZESTRIL) 5 MG tablet   Oral   Take 5 mg by mouth daily. Take 3 tablets daily         . meloxicam (MOBIC) 15 MG tablet   Oral   Take 15 mg by mouth daily.         . Omeprazole (PRILOSEC PO)   Oral   Take 1 tablet by mouth daily.         Marland Kitchen oxybutynin (  DITROPAN-XL) 5 MG 24 hr tablet   Oral   Take 5 mg by mouth daily.          BP 149/62  Pulse 91  Temp(Src) 98.2 F (36.8 C) (Oral)  Resp 20  Wt 133 lb 6.4 oz (60.51 kg)  BMI 24.8 kg/m2  SpO2 100% Physical Exam  Vitals reviewed. Constitutional: She is oriented to person, place, and time. She appears well-developed and well-nourished. No distress.  HENT:  Head: Normocephalic and atraumatic.  Mouth/Throat: Oropharynx is clear and moist.  Eyes: Conjunctivae are normal.  Cardiovascular: Normal rate, regular rhythm, normal heart sounds and intact distal pulses.   Pulmonary/Chest: Effort normal and breath sounds normal.  Abdominal: Soft. Bowel sounds are normal. She exhibits no distension. There is no tenderness.  Genitourinary: Rectal exam shows external hemorrhoid. Rectal exam shows no tenderness. Guaiac positive stool.  Stool negative for gross blood.  Neurological: She is alert and oriented to person, place, and time.  Skin: Skin is warm. There is pallor.    ED  Course  Procedures (including critical care time) Labs Review Labs Reviewed  CBC WITH DIFFERENTIAL - Abnormal; Notable for the following:    RBC 2.89 (*)    Hemoglobin 5.1 (*)    HCT 18.8 (*)    MCV 65.1 (*)    MCH 17.6 (*)    MCHC 27.1 (*)    RDW 18.9 (*)    Platelets 448 (*)    All other components within normal limits  COMPREHENSIVE METABOLIC PANEL - Abnormal; Notable for the following:    GFR calc non Af Amer 86 (*)    All other components within normal limits  APTT - Abnormal; Notable for the following:    aPTT 21 (*)    All other components within normal limits  OCCULT BLOOD, POC DEVICE - Abnormal; Notable for the following:    Fecal Occult Bld POSITIVE (*)    All other components within normal limits  PROTIME-INR  TYPE AND SCREEN  PREPARE RBC (CROSSMATCH)   Imaging Review Dg Chest 2 View  10/03/2013   CLINICAL DATA:  Shortness of breath  EXAM: CHEST  2 VIEW  COMPARISON:  None.  FINDINGS: Mild cardiomegaly. Upper mediastinal contours unremarkable. Density overlapping the right atrium has no correlate in the lateral projection to suggest pulmonary nodule. Aortic atherosclerosis. No edema, infiltrate, effusion, or pneumothorax. Partly imaged scoliotic curvature of the lumbar spine, with advanced lumbar degenerative disc change.  IMPRESSION: Cardiomegaly.  No pulmonary edema.   Electronically Signed   By: Tiburcio Pea M.D.   On: 10/03/2013 22:27    EKG Interpretation     Ventricular Rate:    PR Interval:    QRS Duration:   QT Interval:    QTC Calculation:   R Axis:     Text Interpretation:             Date: 10/04/2013  Rate: 91  Rhythm: normal sinus rhythm  QRS Axis: normal  Intervals: normal  ST/T Wave abnormalities: normal  Conduction Disutrbances:none  Narrative Interpretation: sinus rhythm, normal axis/intervals, no ischemic changes  Old EKG Reviewed: unchanged    MDM   1. Anemia due to chronic blood loss   2. Esophageal reflux     77 y/o  female with symptomatic anemia. Hgb 5 at PCP. Reports one month of dyspnea on exertion and chest tightness (charts document 6 mo of symptoms). No recent bleeding or melena. No anticoagulant use. AFVSS. Exam benign other than palor. Stool  heme positive but grossly negative.  Consent for blood transfusion obtained and 2 u pRBC given. Will admit to Medicine.   Labs and imaging reviewed in my medical decision making if ordered. Patient discussed with my attending, Dr. Micheline Maze.     Abagail Kitchens, MD 10/04/13 2001

## 2013-10-03 NOTE — Telephone Encounter (Signed)
Received call from lab. Hgb 5.0, Hct 16.9. Will make Dr. Melburn Popper and Michael Litter aware

## 2013-10-03 NOTE — Assessment & Plan Note (Addendum)
Ms. Dorval presents with an abnormal stress test. She had reproducible chest discomfort as well as severe shortness of breath with exertion. She has a mild to moderate anterior defect as well as an lateral defect that extends from the base to the mid lateral wall.  The defect appears to be reversible but I cannot rule a shifting breast artifact.  She informed that she cannot stay to be admitted to the hospital right away. She takes care of her 75-year-old granddaughter.  She would like to be back home to [place] and make arrangements to return tomorrow.   Her symptoms are only exertional.  She was sent home with instructions to on day for cardiac catheterization.  Several hours after she left the lab data returned. She was found to have marked anemia with a hemoglobin of 5.0. This almost certainly would be the major cause of dyspnea with exertion and may also contribute to her ST segment depression during the treadmill.  At this time we'll cancel the cardiac catheterization. She needs a complete and full workup for her anemia. She's quite symptomatic with her anemia-severe shortness of breath with exertion with chest tightness. She probably need transfusion of several units of packed red blood cells. She'll need to be admitted to the internal medicine team and may need a GI evaluation. I recommended that we discontinue her aspirin. At this point, we do not have any firm evidence that she has unstable angina or coronary artery disease. She will need to have further evaluations including possibly a colonoscopy and endoscopy. She is clear for these procedures and should be at low risk.    We'll start her on metoprolol 25 mg twice a day. Will also call in a prescription for nitroglycerin. We'll schedule her for cardiac cath tomorrow.  We have discussed the risks, benefits, and options of cardiac cath.  She understands and agrees to proceed.

## 2013-10-03 NOTE — Progress Notes (Signed)
Christine Morse Date of Birth  [birth year]       [facility]    [place] [address]  [address] [place], [place]  [address]   [place], [place]  [address] [phone]     [phone]   Fax  [phone]    Fax [phone]  Problem List: 1. Chest pain - abnormal stress test 2. HTN 3, Hyperlipidemia   History of Present Illness:  Christine Morse is a 77 year old new patient who presents for evaluation of dyspnea on exertion.  She was seen by Dr. Antoine Poche recently for exertional angina.   She reports she has been having significant dyspnea on exertion having to stop after 15 steps, less than 25 yards of walking on a slight incline, and with many daily activities.  This is been going on for the past 6-8 months but seems significantly worsened over the past 2.  She has never had anything like this before.  She does not have any associated chest pain but does report a significant chest pressure over her upper chest bilaterally radiating to her bilateral anterior shoulders.  She will occasionally have diaphoresis with these episodes but denies any nausea or vomiting; denys any dizziness or orthostasis.  She reports the pain will occasionally radiate into her bilateral jaws and neck.  She has no pain radiating down her arms.  Rest is the only thing that seems to alleviate her symptoms.  She has not tried any medications.   She's never had any prior cardiac history. She has hypertension and  hyperlipidemia but no history of diabetes.  She is not on a statin but does take a daily aspirin.  Oct. 14, 2014:  [name] is seen today as a work in visit after having an abnormal stress test.  She has been fairly healthy and has been active up until 6 months ago when her symptoms started.   She has exertional chest pressure that lasts for several minutes and then resolves with rest.   Today she walked for total 3 minutes on a standard Bruce protocol total test.  She had 1 mm of ST segment depression and developed her typical symptoms of severe shortness breath with chest tightness. The symptoms resolved during the recovery phase.  The Myoview images revealed a mild - moderate defect in the mid anterior wall as well as the lateral basal segments.  She was placed on the schedule for further evaluation.  I could not exclude breast artifact but the defect appeared to be reversible.  In talking with her, she takes care of her 30-year-old granddaughter. She absolutely needs to go back home to [place], [place].   We set  her up for a cardiac catheterization for tomorrow.    Current Outpatient Prescriptions on File Prior to Visit  Medication Sig Dispense Refill  . alendronate (FOSAMAX) 70 MG tablet Take 70 mg by mouth every 7 (seven) days. Take with a full glass of water on an empty stomach.      Marland Kitchen aspirin EC 81 MG tablet Take 1 tablet (81 mg total) by mouth daily.      . Calcium Carbonate-Vitamin D (CALCIUM + D PO) Take 1 capsule by mouth daily.      Marland Kitchen etodolac (LODINE) 400 MG tablet Take 400 mg by mouth 2 (two) times daily.      . fenofibrate 54 MG tablet Take 54 mg by mouth daily.      Marland Kitchen lisinopril (  PRINIVIL,ZESTRIL) 5 MG tablet Take 5 mg by mouth daily. Take 3 tablets daily      . meloxicam (MOBIC) 15 MG tablet Take 15 mg by mouth daily.      . Omeprazole (PRILOSEC PO) Take 1 tablet by mouth daily.      Marland Kitchen oxybutynin (DITROPAN-XL) 5 MG 24 hr tablet Take 5 mg by mouth daily.       No current facility-administered medications on file prior to visit.    No Known Allergies  Past Medical History  Diagnosis Date  . Diverticulosis   . History of nephrolithiasis   . Diverticulitis   . Hypertension   . Hyperlipidemia   . GERD (gastroesophageal reflux disease)     Past Surgical History  Procedure Laterality Date  . Abdominal hysterectomy    . Left oophorectomy      History  Smoking status  . Never Smoker   Smokeless tobacco  . Never Used     History  Alcohol Use No    Family History  Problem Relation Age of Onset  . Colon cancer Neg Hx   . Ovarian cancer Sister     Reviw of Systems:  Reviewed in the HPI.  All other systems are negative.  Physical Exam: Blood pressure 132/59, pulse 74, height 5' 1.5" (1.562 m), weight 134 lb (60.782 kg). General: Well developed, well nourished, in no acute distress.  Head: Normocephalic, atraumatic, sclera non-icteric, mucus membranes are moist,   Neck: Supple. Carotids are 2 + without bruits. No JVD   Lungs: Clear   Heart: RR, normal S1, S2  Abdomen: Soft, non-tender, non-distended with normal bowel sounds.  Msk:  Strength and tone are normal   Extremities: No clubbing or cyanosis. No edema.  Distal pedal pulses are 2+ and equal    Neuro: CN II - XII intact.  Alert and oriented X 3.   Psych:  Normal   ECG: NSR, no ST or T wave abnormalities.   Assessment / Plan:

## 2013-10-04 ENCOUNTER — Ambulatory Visit (HOSPITAL_COMMUNITY): Admission: RE | Admit: 2013-10-04 | Payer: Medicare Other | Source: Ambulatory Visit | Admitting: Cardiovascular Disease

## 2013-10-04 ENCOUNTER — Encounter (HOSPITAL_COMMUNITY)
Admission: EM | Disposition: A | Discharge status: Home / Self Care | Payer: Self-pay | Source: Home / Self Care | Attending: Internal Medicine

## 2013-10-04 ENCOUNTER — Encounter (HOSPITAL_COMMUNITY): Payer: Self-pay | Admitting: General Practice

## 2013-10-04 ENCOUNTER — Encounter (HOSPITAL_COMMUNITY): Admission: RE | Payer: Self-pay | Source: Ambulatory Visit

## 2013-10-04 DIAGNOSIS — D500 Iron deficiency anemia secondary to blood loss (chronic): Secondary | ICD-10-CM | POA: Diagnosis not present

## 2013-10-04 DIAGNOSIS — K219 Gastro-esophageal reflux disease without esophagitis: Secondary | ICD-10-CM | POA: Diagnosis not present

## 2013-10-04 DIAGNOSIS — K2960 Other gastritis without bleeding: Secondary | ICD-10-CM | POA: Diagnosis present

## 2013-10-04 HISTORY — PX: MALONEY DILATION: SHX5535

## 2013-10-04 HISTORY — PX: ESOPHAGOGASTRODUODENOSCOPY: SHX5428

## 2013-10-04 LAB — CBC
MCV: 70.7 fL — ABNORMAL LOW (ref 78.0–100.0)
Platelets: 358 10*3/uL (ref 150–400)
RBC: 3.65 MIL/uL — ABNORMAL LOW (ref 3.87–5.11)
WBC: 4.8 10*3/uL (ref 4.0–10.5)

## 2013-10-04 LAB — BASIC METABOLIC PANEL
CO2: 24 mEq/L (ref 19–32)
Chloride: 105 mEq/L (ref 96–112)
Creatinine, Ser: 0.57 mg/dL (ref 0.50–1.10)
GFR calc Af Amer: >90 mL/min (ref >90)
Potassium: 3.9 mEq/L (ref 3.5–5.1)
Sodium: 137 mEq/L (ref 135–145)

## 2013-10-04 SURGERY — LEFT HEART CATHETERIZATION WITH CORONARY ANGIOGRAM
Anesthesia: LOCAL

## 2013-10-04 SURGERY — EGD (ESOPHAGOGASTRODUODENOSCOPY)
Anesthesia: Moderate Sedation

## 2013-10-04 MED ORDER — MIDAZOLAM HCL 10 MG/2ML IJ SOLN
INTRAMUSCULAR | Status: DC | PRN
  Administered 2013-10-04: 2 mg via INTRAVENOUS
  Administered 2013-10-04: 1 mg via INTRAVENOUS

## 2013-10-04 MED ORDER — SODIUM CHLORIDE 0.9 % IV SOLN
1000.0000 mg | Freq: Once | INTRAVENOUS | Status: AC
  Administered 2013-10-04: 1000 mg via INTRAVENOUS
  Filled 2013-10-04 (×2): qty 20

## 2013-10-04 MED ORDER — LISINOPRIL 5 MG PO TABS
15.0000 mg | ORAL_TABLET | Freq: Every day | ORAL | Status: DC
  Administered 2013-10-04: 15 mg via ORAL
  Filled 2013-10-04: qty 3

## 2013-10-04 MED ORDER — SODIUM CHLORIDE 0.9 % IV SOLN
INTRAVENOUS | Status: DC
  Administered 2013-10-04: 500 mL via INTRAVENOUS

## 2013-10-04 MED ORDER — DIPHENHYDRAMINE HCL 50 MG/ML IJ SOLN
INTRAMUSCULAR | Status: AC
  Filled 2013-10-04: qty 1

## 2013-10-04 MED ORDER — CLOPIDOGREL BISULFATE 75 MG PO TABS
75.0000 mg | ORAL_TABLET | Freq: Every day | ORAL | Status: DC
  Administered 2013-10-05: 75 mg via ORAL
  Filled 2013-10-04: qty 1

## 2013-10-04 MED ORDER — OXYBUTYNIN CHLORIDE ER 5 MG PO TB24
5.0000 mg | ORAL_TABLET | Freq: Every day | ORAL | Status: DC
  Filled 2013-10-04: qty 1

## 2013-10-04 MED ORDER — FENTANYL CITRATE 0.05 MG/ML IJ SOLN
INTRAMUSCULAR | Status: AC
  Filled 2013-10-04: qty 2

## 2013-10-04 MED ORDER — BUTAMBEN-TETRACAINE-BENZOCAINE 2-2-14 % EX AERO
INHALATION_SPRAY | CUTANEOUS | Status: DC | PRN
  Administered 2013-10-04: 2 via TOPICAL

## 2013-10-04 MED ORDER — FENTANYL CITRATE 0.05 MG/ML IJ SOLN
INTRAMUSCULAR | Status: DC | PRN
  Administered 2013-10-04: 25 ug via INTRAVENOUS

## 2013-10-04 MED ORDER — IRON DEXTRAN 50 MG/ML IJ SOLN
25.0000 mg | Freq: Once | INTRAMUSCULAR | Status: AC
  Administered 2013-10-04: 25 mg via INTRAVENOUS
  Filled 2013-10-04: qty 0.5

## 2013-10-04 MED ORDER — MIDAZOLAM HCL 5 MG/ML IJ SOLN
INTRAMUSCULAR | Status: AC
  Filled 2013-10-04: qty 2

## 2013-10-04 MED ORDER — SODIUM CHLORIDE 0.9 % IJ SOLN
3.0000 mL | Freq: Two times a day (BID) | INTRAMUSCULAR | Status: DC
  Administered 2013-10-04: 3 mL via INTRAVENOUS
  Administered 2013-10-04: 6 mL via INTRAVENOUS

## 2013-10-04 MED ORDER — LISINOPRIL 5 MG PO TABS: 15.0000 mg | ORAL_TABLET | Freq: Every day | ORAL | Status: DC

## 2013-10-04 MED ORDER — PANTOPRAZOLE SODIUM 40 MG PO TBEC
40.0000 mg | DELAYED_RELEASE_TABLET | Freq: Every day | ORAL | Status: DC
  Administered 2013-10-04 – 2013-10-05 (×2): 40 mg via ORAL
  Filled 2013-10-04: qty 1

## 2013-10-04 MED ORDER — LISINOPRIL 5 MG PO TABS
15.0000 mg | ORAL_TABLET | Freq: Every day | ORAL | Status: DC
  Filled 2013-10-04: qty 1

## 2013-10-04 MED ORDER — FENOFIBRATE 54 MG PO TABS
54.0000 mg | ORAL_TABLET | Freq: Every day | ORAL | Status: DC
  Administered 2013-10-04: 54 mg via ORAL
  Filled 2013-10-04 (×2): qty 1

## 2013-10-04 MED ORDER — PANTOPRAZOLE SODIUM 40 MG PO TBEC
80.0000 mg | DELAYED_RELEASE_TABLET | Freq: Every day | ORAL | Status: DC
  Filled 2013-10-04: qty 2

## 2013-10-04 NOTE — H&P (Signed)
[facility] History and Physical  Christine Morse ZOX:[id number] DOB: 11/14/1936 DOA: 10/03/2013  Referring physician: ED PCP: No PCP Per Patient  Chief Complaint: Fatigue, SOB  HPI: Christine Morse is a 77 y.o. female who presents to the ED after a failed cardiac stress test earlier today for ongoing complaints of chronic fatigue and SOB.  However after she failed the stress test, lab work revealed that her HGB was 5.1.  She was sent to the ED for further evaluation of her low HGB.  Patient has been having SOB and DOE for the past 1 month.  No melanotic stools or BRBPR.  In the ED she was found to be guiac positive.  Review of Systems: 12 systems reviewed and otherwise negative.  Past Medical History  Diagnosis Date  . Diverticulosis   . Diverticulitis   . Hypertension   . GERD (gastroesophageal reflux disease)   . Nephrolithiasis     "passed them on my own; went away after I quit drinking tea" (10/04/2013)  . Hyperlipidemia   . Cyst of ovary, right 2013    "/US" (10/04/2013)  . Shortness of breath     "related to low blood level" (10/04/2013)  . Anemia   . History of blood transfusion     "just today; my HgB is 5.1" (10/04/2013)   Past Surgical History  Procedure Laterality Date  . Left oophorectomy Left 1981  . Abdominal hysterectomy  1981   Social History:  reports that she has quit smoking. Her smoking use included Cigarettes. She smoked 0.00 packs per day for .5 years. She has never used smokeless tobacco. She reports that she does not drink alcohol or use illicit drugs.   No Known Allergies  Family History  Problem Relation Age of Onset  . Colon cancer Neg Hx   . Ovarian cancer Sister      Prior to Admission medications   Medication Sig Start Date End Date Taking? Authorizing Provider  alendronate (FOSAMAX) 70 MG tablet Take 70 mg by mouth every 7 (seven) days. Take with a full glass of water on an empty stomach.   Yes Historical Provider, MD  aspirin EC  81 MG tablet Take 1 tablet (81 mg total) by mouth daily. 08/23/13  Yes Andrena Mews, DO  Calcium Carbonate-Vitamin D (CALCIUM + D PO) Take 1 capsule by mouth daily.   Yes Historical Provider, MD  etodolac (LODINE) 400 MG tablet Take 400 mg by mouth 2 (two) times daily.   Yes Historical Provider, MD  fenofibrate 54 MG tablet Take 54 mg by mouth daily.   Yes Historical Provider, MD  lisinopril (PRINIVIL,ZESTRIL) 5 MG tablet Take 5 mg by mouth daily. Take 3 tablets daily   Yes Historical Provider, MD  meloxicam (MOBIC) 15 MG tablet Take 15 mg by mouth daily.   Yes Historical Provider, MD  Omeprazole (PRILOSEC PO) Take 1 tablet by mouth daily.   Yes Historical Provider, MD  oxybutynin (DITROPAN-XL) 5 MG 24 hr tablet Take 5 mg by mouth daily.   Yes Historical Provider, MD   Physical Exam: Filed Vitals:   10/04/13 0105  BP: 111/77  Pulse: 97  Temp: 98.2 F (36.8 C)  Resp: 18    General:  NAD, resting comfortably in bed Eyes: PEERLA EOMI ENT: mucous membranes moist Neck: supple w/o JVD Cardiovascular: RRR w/o MRG Respiratory: CTA B Abdomen: soft, nt, nd, bs+ Skin: no rash nor lesion Musculoskeletal: MAE, full ROM all 4 extremities Psychiatric: normal tone and  affect Neurologic: AAOx3, grossly non-focal  Labs on Admission:  Basic Metabolic Panel:  Recent Labs Lab 10/03/13 1356 10/03/13 1951  NA 137 140  K 3.8 4.6  CL 105 106  CO2 23 22  GLUCOSE 104* 99  BUN 10 14  CREATININE 0.5 0.64  CALCIUM 9.2 9.2   Liver Function Tests:  Recent Labs Lab 10/03/13 1951  AST 26  ALT 17  ALKPHOS 53  BILITOT 0.3  PROT 7.0  ALBUMIN 3.9   No results found for this basename: LIPASE, AMYLASE,  in the last 168 hours No results found for this basename: AMMONIA,  in the last 168 hours CBC:  Recent Labs Lab 10/03/13 1356 10/03/13 1951  WBC 4.1* 5.4  NEUTROABS  --  3.9  HGB 5.0 cL* 5.1*  HCT 16.9 Repeated and verified X2.* 18.8*  MCV 60.3 Repeated and verified X2.* 65.1*  PLT  461.0* 448*   Cardiac Enzymes: No results found for this basename: CKTOTAL, CKMB, CKMBINDEX, TROPONINI,  in the last 168 hours  BNP (last 3 results)  Recent Labs  08/23/13 1603  PROBNP 83.0   CBG: No results found for this basename: GLUCAP,  in the last 168 hours  Radiological Exams on Admission: Dg Chest 2 View  10/03/2013   CLINICAL DATA:  Shortness of breath  EXAM: CHEST  2 VIEW  COMPARISON:  None.  FINDINGS: Mild cardiomegaly. Upper mediastinal contours unremarkable. Density overlapping the right atrium has no correlate in the lateral projection to suggest pulmonary nodule. Aortic atherosclerosis. No edema, infiltrate, effusion, or pneumothorax. Partly imaged scoliotic curvature of the lumbar spine, with advanced lumbar degenerative disc change.  IMPRESSION: Cardiomegaly.  No pulmonary edema.   Electronically Signed   By: Tiburcio Pea M.D.   On: 10/03/2013 22:27    EKG: Independently reviewed.  Assessment/Plan Principal Problem:   Anemia due to chronic blood loss   1. Chronic GI blood loss anemia - needs GI consultation and likely endoscopy for chronic blood loss from GI source.  Transfusing PRBC, repeat hemoglobin in AM.  Cardiology has canceled her previously planned cardiac cath due to the low hemoglobin and feels that this is more likely the cause of her symptoms than CAD at this time.    Code Status: Full (must indicate code status--if unknown or must be presumed, indicate so) Family Communication: No family in room (indicate person spoken with, if applicable, with phone number if by telephone) Disposition Plan: Admit to inpatient (indicate anticipated LOS)  Time spent: 70 min  GARDNER, JARED M. [facility] Pager [phone]  If 7PM-7AM, please contact night-coverage [email or web address] Password [email or web address] 10/04/2013, 1:35 AM

## 2013-10-04 NOTE — Op Note (Signed)
[facility] [address] [place], [address]   ENDOSCOPY PROCEDURE REPORT  PATIENT: Christine, Morse  MR#: [id number] BIRTHDATE: Apr 29, 1936 , 76  yrs. old GENDER: Female ENDOSCOPIST: Hart Carwin, MD REFERRED BY:  Daryel Gerald, N.P. , Dr Lamont Snowball PROCEDURE DATE:  10/04/2013 PROCEDURE:  EGD w/ biopsy ASA CLASS:     Class III INDICATIONS:  Iron deficiency anemia.   Hgb 5.0, hemepositive stool, EGD 1914,7829- 5 cm hiatal hernia. MEDICATIONS: These medications were titrated to patient response per physician's verbal order, Fentanyl 25 mcg IV, and Versed 3 mg IV TOPICAL ANESTHETIC: Cetacaine Spray  DESCRIPTION OF PROCEDURE: After the risks benefits and alternatives of the procedure were thoroughly explained, informed consent was obtained.  The Pentax Gastroscope [id number] endoscope was introduced through the mouth and advanced to the second portion of the duodenum. Without limitations.  The instrument was slowly withdrawn as the mucosa was fully examined.      Esophagus: proximal, mid and distal esophageal mucosa appeared normal. There was a  normal squamocolumnar junction and there was a mild nonobstructing esophageal stricture at the level of 35 cm from the incisors it allowed the endoscope to traverse through. The lumen was eccentric ,there were no active erosions or bleeding Stomach: There was a large nonreducible hiatal hernia extending from 35-39 cm from the incisors. Size of the hernia was 4 cm. There were superficial Cameron erosions, without sign of bleeding, gastric antrum and pyloric outlet was normal. Retroflexion of the endoscope revealed normal fundus and cardia Duodenum: Duodenal bulb and descending duodenum was unremarkable. Multiple biopsies were taken from second portion duodenum to rule out villous atr[          The scope was then withdrawn from the patient and the procedure completed.  COMPLICATIONS: There were no  complications. ENDOSCOPIC IMPRESSION: large 5 cm nonreducible hiatal hernia superficial Cameron erosions most likely causing chronic low-grade GI blood loss No active bleeding noted on today's exam Mild nonobstructing esophageal stricture not dilated Status post small bowel biopsies to rule out villous atrophy RECOMMENDATIONS: 1.  Await pathology results 2.  Anti-reflux regimen to be follow 3.  Continue PPI 4.resume Plavix Careful monitoring in the future f or GI blood loss which will include serial H&H. stool Hemoccults and possible iron infusions ( since patient is intolerant to oral iron),she will receive iron infusion today before discharge, H/H may be monitored by her PCP in [place]-  REPEAT EXAM: no  eSigned:  Hart Carwin, MD 10/04/2013 4:04 PM   CC:  PATIENT NAME:  Christine, Morse MR#: [id number]

## 2013-10-04 NOTE — Progress Notes (Signed)
Patient admitted after midnight.  Chart reviewed.  Patient examined.  EGD showed cameron lesions.  Getting IV iron currently.  Home in am if stable.  Appreciate GI  Crista Curb, MD

## 2013-10-04 NOTE — Consult Note (Signed)
[facility] Consult: 8:30 AM 10/04/2013   Referring Provider: Lewie Loron Primary Care Physician:  MD is a woman in [place], pt does not recall MD name.  Primary Gastroenterologist:  Dr. Sheryn Bison   Reason for Consultation:  Microcytic anemia.   HPI: Christine Morse is a 77 y.o. female. Hx hypertension, HLD.  Resident of [place], [place].  Had Colonoscopy and capsule endoscopy in 07/2012 as work up for IDA, FOB +.  Findings of diverticulosis, non-bleeding hemorrhoids, non-specific erythema/edema in duodenal folds.  EGD aborted due to severe bronchospasm.  Dr Jarold Motto queried Sheria Lang erosions as source of anemia siting the large HH seen on 2006 EGD. Pt RXd po Iron but stopped it > one year ago as it caused diarrhea and "griping" of GI tract.   On 08/24/13, seen by cardiology for 8 months DOE, exertional chest pressure. ECHO 09/07/2013: 65 - 70% LVEF, grade 1 diastolic dysfx, no valve pathology.  Nuclear stress test 10/03/13: Poor exercise tolerance, no angina, + ischemic ST changes. Dr Eden Emms planned Cardiac cath.  Pt deferred hospital admission until 10/15 due to grandchild care obligations. In interim CBC retuned with Hgb of 5.0.  Pt contacted and now admitted. Stool tests + for FOB.   S/p 2 units PRBCs.  Per Dr Fabio Bering note: "At this point, we do not have any firm evidence that she has unstable angina or coronary artery disease. She will need to have further evaluations including possibly a colonoscopy and endoscopy. She is clear for these procedures and should be at low risk." He cancelled the cardiac cath.   Pt not taking any NSAIDs, just 81 mg ASA. Takes her Omeprazole fathfully and also takes weekly Fosamax.  No black or bloody stools.  No dysphagia but does have dry cough that is worse with eating.  No sores in mouth.  1 month or so of decreased appetite.  10 # weight loss over 8 to 12 months.      Past Medical History  Diagnosis  Date  . Diverticulosis   . Diverticulitis   . Hypertension   . GERD (gastroesophageal reflux disease)   . Nephrolithiasis     "passed them on my own; went away after I quit drinking tea" (10/04/2013)  . Hyperlipidemia   . Cyst of ovary, right 2013    "/US" (10/04/2013)  . Shortness of breath     "related to low blood level" (10/04/2013)  . Anemia   . History of blood transfusion     "just today; my HgB is 5.1" (10/04/2013)    Past Surgical History  Procedure Laterality Date  . Left oophorectomy Left 1981  . Abdominal hysterectomy  1981    Prior to Admission medications   Medication Sig Start Date End Date Taking? Authorizing Provider  alendronate (FOSAMAX) 70 MG tablet Take 70 mg by mouth every 7 (seven) days. Take with a full glass of water on an empty stomach.   Yes Historical Provider, MD  aspirin EC 81 MG tablet Take 1 tablet (81 mg total) by mouth daily. 08/23/13  Yes Andrena Mews, DO  Calcium Carbonate-Vitamin D (CALCIUM + D PO) Take 1 capsule by mouth daily.   Yes Historical Provider, MD         fenofibrate 54 MG tablet Take 54 mg by mouth daily.   Yes Historical Provider, MD  lisinopril (PRINIVIL,ZESTRIL) 5 MG tablet Take 5 mg by mouth daily. Take 3 tablets daily   Yes Historical Provider, MD  Omeprazole (PRILOSEC PO) Take 1 tablet by mouth daily.   Yes Historical Provider, MD    Scheduled Meds: . fenofibrate  54 mg Oral Daily  . lisinopril  5 mg Oral Daily  . oxybutynin  5 mg Oral Daily  . pantoprazole  80 mg Oral Daily  . sodium chloride  3 mL Intravenous Q12H   Infusions:   PRN Meds:    Allergies as of 10/03/2013  . (No Known Allergies)    Family History  Problem Relation Age of Onset  . Colon cancer Neg Hx   . Ovarian cancer Sister     History   Social History  . Marital Status: Married    Spouse Name: N/A    Number of Children: 2  . Years of Education: N/A   Occupational History  . Retired    Social History Main Topics  .  Smoking status: Former Smoker -- .5 years    Types: Cigarettes  . Smokeless tobacco: Never Used  . Alcohol Use: No  . Drug Use: No  . Sexual Activity: No    Social History Narrative  . Raising her 67 y/o great grandaughter whose mother has a drug problem.  Also raised her now independent grandson    REVIEW OF SYSTEMS: Constitutional:  fatigue ENT:  No nose bleeds, no congestion Eyes:  No acute change in vision, no cataracts.  Thinks she needs Rx adjustment to her bifocals as vision is a bit blurry Pulm:  + DOE.  + dry cough especially with eating CV:  Per HPI.  Last 10 days: swellling in feet/ankles, this resolved overnight.  GU:  No dysuria, no hematuria GYN:  Mamogram done in 04/2013 GI:  No dysphagia.  No n/V Heme:  Not taking iron.    Transfusions:  None before yesterday Neuro:  No headache, no presyncope, no gait disorder Derm:  No rash, no sores, no itching Endocrine:  No heat or cold intolerance.  No hx DM Immunization:  No flu shot yet this year Travel:  None outside [place]   PHYSICAL EXAM: Vital signs in last 24 hours: Temp:  [98 F (36.7 C)-99.1 F (37.3 C)] 98.3 F (36.8 C) (10/15 0419) Pulse Rate:  [74-135] 77 (10/15 0419) Resp:  [16-22] 20 (10/15 0419) BP: (111-172)/(55-90) 143/69 mmHg (10/15 0419) SpO2:  [94 %-100 %] 97 % (10/15 0419) Weight:  [60.51 kg (133 lb 6.4 oz)-62.415 kg (137 lb 9.6 oz)] 62.415 kg (137 lb 9.6 oz) (10/15 0030)  General: pleasant WF who appears her stated age.  Is pale but appears well Head:  No asymmetry or swelling  Eyes:  No icterus, no pallor Ears:  Not HOH  Nose:  No discharge, no sneezing Mouth:  No oral lesions.  Fair dentition.  Moist, clear MM Neck:  No JVD, No mass Lungs:  Clear Bil Heart: RRR Abdomen:  Soft, NT, ND.  No mass or HSM,  No scars, no hernias.  + active BS.   Rectal: FOB + brown stool   Musc/Skeltl: no joint swelling or contracture. Extremities:  No CCE.  3+ pedal pulses bil  Neurologic:  Oriented x 3.  No  tremor, no gross deficits Skin:  No telangectasias, no sores, no hematomas or purpura Tattoos:  none Nodes:  No cervical or inguinal adenopathy   Psych:  Pleasant, cooperative, relaxed.   Intake/Output from previous day: 10/14 0701 - 10/15 0700 In: 637.5 [Blood:637.5] Out: -  Intake/Output this shift:    LAB RESULTS:  Recent Labs  10/03/13 1356 10/03/13 1951 10/04/13 0446  WBC 4.1* 5.4 4.8  HGB 5.0 cL* 5.1* 7.9*  HCT 16.9 Repeated and verified X2.* 18.8* 25.8*  PLT 461.0* 448* 358  MCV    70 BMET Lab Results  Component Value Date   NA 137 10/04/2013   NA 140 10/03/2013   NA 137 10/03/2013   K 3.9 10/04/2013   K 4.6 10/03/2013   K 3.8 10/03/2013   CL 105 10/04/2013   CL 106 10/03/2013   CL 105 10/03/2013   CO2 24 10/04/2013   CO2 22 10/03/2013   CO2 23 10/03/2013   GLUCOSE 85 10/04/2013   GLUCOSE 99 10/03/2013   GLUCOSE 104* 10/03/2013   BUN 13 10/04/2013   BUN 14 10/03/2013   BUN 10 10/03/2013   CREATININE 0.57 10/04/2013   CREATININE 0.64 10/03/2013   CREATININE 0.5 10/03/2013   CALCIUM 8.4 10/04/2013   CALCIUM 9.2 10/03/2013   CALCIUM 9.2 10/03/2013   LFT  Recent Labs  10/03/13 1951  PROT 7.0  ALBUMIN 3.9  AST 26  ALT 17  ALKPHOS 53  BILITOT 0.3   PT/INR Lab Results  Component Value Date   INR 0.97 10/03/2013   INR 1.0 10/03/2013    RADIOLOGY STUDIES: Dg Chest 2 View 10/03/2013   .  FINDINGS: Mild cardiomegaly. Upper mediastinal contours unremarkable. Density overlapping the right atrium has no correlate in the lateral projection to suggest pulmonary nodule. Aortic atherosclerosis. No edema, infiltrate, effusion, or pneumothorax. Partly imaged scoliotic curvature of the lumbar spine, with advanced lumbar degenerative disc change.  IMPRESSION: Cardiomegaly.  No pulmonary edema.   Electronically Signed   By: Tiburcio Pea M.D.   On: 10/03/2013 22:27    ENDOSCOPIC STUDIES: 08/17/2012  Capsule endoscopy Mild erythema and inflamation in  edematous folds of proximal duodenum.  Area of GE jx and known large HH (seen on EGD in 2006) ware not well seen but ? If Cameron erosions cause for FOB+ anemia.  08/10/2012  Colonoscopy for iron deficiency anemia, anemia FOB + stools Mild left sided diverticulosis, ext hemorrhoids.   08/17/2012 EGD (with propofol sedation) Aborted due to severe upper airway spasm.   EGD 2006 for dysphagia Prolapsing, 5 cm long HH.  No stricture but did empirically dilate esophagus.    IMPRESSION: *  Acute anemia, microcytic.  FOB +. This is a chronic issue.  Full work up ( excluding EGD) in 07/2012.  Not compliant with po Iron.  This is the first time she's needed transfusion. Rule out chronic blood loss from [facility] erosions, sequelae to known large HH. Hx of same in 07/2012:  Unremarkable colonoscopy and CE, unable to do EGD due to upper airway spasm.  *  + nuclear cardiac stress test. Unremarkable echo. SOB and chest pressure secondary to profound anemia. No cardiac enzymes ordered.  Her sxs have resolved post transfusion.   Not sure if Cardiology will be pursuing Cath or not, for now it is cancelled.  *  Osteoporosis.  Uses weekly Fosamax, this may not be safe in pt with large HH.     PLAN: *  check TSH to r/o thyroid disease *  EGD today.  Likely will not need colonoscopy, it was done in 07/2012. *  Stop Ditropan, she does not use this at home. *  Parenteral iron infusion. I ordered.    LOS: 1 day   Jennye Moccasin  10/04/2013, 8:30 AM Pager: [phone] Attending MD note:   I have reviewed the above note,  examined the patient and agree with plan of treatment.  [facility] Pager # [phone]  Please see EGD note- large hiatal hernia  With Sheria Lang erosions, likely source of blood loss

## 2013-10-04 NOTE — Progress Notes (Signed)
Nutrition Brief Note  Patient identified on the Malnutrition Screening Tool (MST) Report for recent 10-15 lb weight loss. Per review of usual weights below, patient has not lost a significant amount of weight over the past year.  Wt Readings from Last 15 Encounters:  10/04/13 137 lb 9.6 oz (62.415 kg)  10/03/13 134 lb (60.782 kg)  10/03/13 134 lb (60.782 kg)  08/23/13 134 lb 9.6 oz (61.054 kg)  07/29/12 139 lb (63.05 kg)  07/25/12 142 lb (64.411 kg)  04/09/09 139 lb 8 oz (63.277 kg)  03/26/09 140 lb (63.504 kg)    Body mass index is 26.01 kg/(m^2). Patient meets criteria for overweight based on current BMI.   Current diet order is NPO for a procedure. Labs and medications reviewed.   No nutrition interventions warranted at this time. If nutrition issues arise, please consult RD.   Joaquin Courts, RD, LDN, CNSC Pager [phone] After Hours Pager [phone]

## 2013-10-05 ENCOUNTER — Encounter (HOSPITAL_COMMUNITY): Payer: Self-pay | Admitting: Internal Medicine

## 2013-10-05 ENCOUNTER — Telehealth: Payer: Self-pay | Admitting: Cardiology

## 2013-10-05 DIAGNOSIS — R197 Diarrhea, unspecified: Secondary | ICD-10-CM

## 2013-10-05 DIAGNOSIS — D649 Anemia, unspecified: Secondary | ICD-10-CM | POA: Diagnosis not present

## 2013-10-05 DIAGNOSIS — K2960 Other gastritis without bleeding: Secondary | ICD-10-CM

## 2013-10-05 DIAGNOSIS — K259 Gastric ulcer, unspecified as acute or chronic, without hemorrhage or perforation: Secondary | ICD-10-CM | POA: Diagnosis not present

## 2013-10-05 DIAGNOSIS — K219 Gastro-esophageal reflux disease without esophagitis: Secondary | ICD-10-CM | POA: Diagnosis not present

## 2013-10-05 DIAGNOSIS — D500 Iron deficiency anemia secondary to blood loss (chronic): Secondary | ICD-10-CM | POA: Diagnosis not present

## 2013-10-05 LAB — TYPE AND SCREEN
ABO/RH(D): O POS
Unit division: 0

## 2013-10-05 LAB — CBC
HCT: 26.8 % — ABNORMAL LOW (ref 36.0–46.0)
Hemoglobin: 8.2 g/dL — ABNORMAL LOW (ref 12.0–15.0)
MCH: 21.8 pg — ABNORMAL LOW (ref 26.0–34.0)
MCHC: 30.6 g/dL (ref 30.0–36.0)
MCV: 71.1 fL — ABNORMAL LOW (ref 78.0–100.0)

## 2013-10-05 MED ORDER — FERROUS SULFATE 325 (65 FE) MG PO TABS: 325.0000 mg | ORAL_TABLET | Freq: Every day | ORAL | Status: DC

## 2013-10-05 MED ORDER — FERROUS FUMARATE 325 (106 FE) MG PO TABS
1.0000 | ORAL_TABLET | Freq: Every day | ORAL | Status: DC
Start: 2013-10-05 — End: 2013-10-05
  Filled 2013-10-05: qty 1

## 2013-10-05 NOTE — ED Provider Notes (Addendum)
Medical screening examination/treatment/procedure(s) were conducted as a shared visit with resident-physician practitioner(s) and myself.  I personally evaluated the patient during the encounter.  Pt is a 77 y.o. female with pmhx as above presenting with anemia from outpt stress.  Pt found to have about 5 which correlated with outside labs. She is pale, but in NAD on PE.  Denies current CP, SOB.   Heme + stool, not grossly blood.  Will transfuse, admit to medical service. I agree w/ resident's EKG interpretation.   Shanna Cisco, MD 10/05/13 4782  Shanna Cisco, MD 10/11/13 1043

## 2013-10-05 NOTE — Telephone Encounter (Signed)
I would like to see her on Friday.

## 2013-10-05 NOTE — Telephone Encounter (Signed)
Patient states she "failed the stress test on Tuesday so they did lab work". Hgb was 5.0 and she was instructed to go to [facility] ED where she received blood and iron infusions.  She states that she has questions now regarding the treatment plan from this past week prior to getting the blood. She states that she feels better now and thinks her concerns over her heart could have been related to the low Hgb.  She would like the following questions answered by Dr. Antoine Poche: 1) Do you still need to see her on Friday afternoon, October 18th, or should she cancel the appointment? 2) Does she need to re-do the Stress Test? 3) Dr. Elease Hashimoto had given her prescriptions for Metoprolol and SL NTG. Should she still get/take them?  And if so, is the metoprolol, in addition to her normal medication Lodine, or in place of it? 4) Are there any "next steps"?  Advised patient that she should keep her appointment unless she hears back from Dr. Antoine Poche or his nurse advising otherwise. Routed to Dr. Antoine Poche and Avie Arenas, RN.

## 2013-10-05 NOTE — Discharge Summary (Signed)
Physician Discharge Summary  Christine Morse:[id number] DOB: [birth year] DOA: 10/03/2013  PCP: No PCP Per Patient  Admit date: 10/03/2013 Discharge date: 10/05/2013  Time spent: greater than 30 minutes  Recommendations for Outpatient Follow-up:  1. Monitor hemoglobin 2. 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 female who presents to the ED after a failed cardiac stress test earlier today for ongoing complaints of chronic fatigue and SOB. However after she failed the stress test, lab work revealed that her HGB was 5.1. She was sent to the ED for further evaluation of her low HGB.  Patient has been having SOB and DOE for the past 1 month. No melanotic stools or BRBPR. In the ED she was found to be guiac positive.  Hospital Course:  Admitted to hospitalists. Transfused 2 units PRBC and given IV iron.  GI consulted and performed EGD which showed cameron erosions.  PPI and iron therapy recommended.  No chest pain while inpatient.  Interested in new PCP.  Procedures: EGD showed large 5 cm nonreducible hiatal hernia superficial Cameron erosions  most likely causing chronic low-grade GI blood loss  No active bleeding noted on today's exam  Mild nonobstructing esophageal stricture not dilated  Status post small bowel biopsies to rule out villous atrophy  Consultations:  [facility]  Discharge Exam: Filed Vitals:   10/05/13 0435  BP: 141/64  Pulse: 69  Temp: 98.4 F (36.9 C)  Resp: 18    General: comfortable.  Alert, oriented Cardiovascular: RRR without MGR Respiratory: CTA without WRR Ext: no CCE  Discharge Instructions  Discharge Orders    Future Appointments Provider Department Dept Phone   10/06/2013 4:00 PM Rollene Rotunda, MD [facility] [place] Office [phone]   Future Orders Complete By Expires   Diet - low sodium heart healthy  As directed    Increase activity slowly  As directed        Medication List         alendronate 70 MG tablet  Commonly known as:  FOSAMAX  Take 70 mg by mouth every 7 (seven) days. Take with a full glass of water on an empty stomach.     aspirin EC 81 MG tablet  Take 1 tablet (81 mg total) by mouth daily.     CALCIUM + D PO  Take 1 capsule by mouth daily.     fenofibrate 54 MG tablet  Take 54 mg by mouth daily.     ferrous sulfate 325 (65 FE) MG tablet  Take 1 tablet (325 mg total) by mouth daily with breakfast.     lisinopril 30 MG tablet  Commonly known as:  PRINIVIL,ZESTRIL  Take 15 mg by mouth daily.     PRILOSEC PO  Take 1 tablet by mouth daily.       No Known Allergies     Follow-up Information   Follow up with [facility], MD In 1 week. (or primary care to check hemoglobin)    Specialty:  Emergency Medicine   Contact information:   [address] [place] [address] [phone]  Call Elyn Aquas., MD. (to reschedule procedure if needed)    Specialty:  Cardiology   Contact information:   [address] [place] [address] [phone]        The results of significant diagnostics from this hospitalization (including imaging, microbiology, ancillary and laboratory) are listed below for reference.    Significant Diagnostic Studies: Dg Chest 2 View  10/03/2013   CLINICAL DATA:  Shortness of breath  EXAM: CHEST  2 VIEW  COMPARISON:  None.  FINDINGS: Mild cardiomegaly. Upper mediastinal contours unremarkable. Density overlapping the right atrium has no correlate in the lateral projection to suggest pulmonary nodule. Aortic atherosclerosis. No edema, infiltrate, effusion, or pneumothorax. Partly imaged  scoliotic curvature of the lumbar spine, with advanced lumbar degenerative disc change.  IMPRESSION: Cardiomegaly.  No pulmonary edema.   Electronically Signed   By: Tiburcio Pea M.D.   On: 10/03/2013 22:27    Microbiology: No results found for this or any previous visit (from the past 240 hour(s)).   Labs: Basic Metabolic Panel:  Recent Labs Lab 10/03/13 1356 10/03/13 1951 10/04/13 0446  NA 137 140 137  K 3.8 4.6 3.9  CL 105 106 105  CO2 23 22 24   GLUCOSE 104* 99 85  BUN 10 14 13   CREATININE 0.5 0.64 0.57  CALCIUM 9.2 9.2 8.4   Liver Function Tests:  Recent Labs Lab 10/03/13 1951  AST 26  ALT 17  ALKPHOS 53  BILITOT 0.3  PROT 7.0  ALBUMIN 3.9   No results found for this basename: LIPASE, AMYLASE,  in the last 168 hours No results found for this basename: AMMONIA,  in the last 168 hours CBC:  Recent Labs Lab 10/03/13 1356 10/03/13 1951 10/04/13 0446 10/05/13 0420  WBC 4.1* 5.4 4.8 5.7  NEUTROABS  --  3.9  --   --   HGB 5.0 cL* 5.1* 7.9* 8.2*  HCT 16.9 Repeated and verified X2.* 18.8* 25.8* 26.8*  MCV 60.3 Repeated and verified X2.* 65.1* 70.7* 71.1*  PLT 461.0* 448* 358 361   Cardiac Enzymes: No results found for this basename: CKTOTAL, CKMB, CKMBINDEX, TROPONINI,  in the last 168 hours BNP: BNP (last 3 results)  Recent Labs  08/23/13 1603  PROBNP 83.0   CBG: No results found for this basename: GLUCAP,  in the last 168 hours  EKG Normal sinus rhythm right atrial enlargement right ventricular hypertrophy no changes from prior  Signed:  Ashling Roane L  [facility] 10/05/2013, 8:46 AM

## 2013-10-05 NOTE — Telephone Encounter (Signed)
New problem:  Pt states she failed her stress test on Tuesday and had to go to the hospital. Pt states her hemoglobin was low, 5, and she had to get blood. Pt would like to speak with a nurse about her experience and what to do next. Please advise

## 2013-10-05 NOTE — Progress Notes (Signed)
     [facility] Daily Rounding Note 10/05/2013, 8:37 AM  SUBJECTIVE:       Feels better.  Dressed and ready for discharge.   OBJECTIVE:         Vital signs in last 24 hours:    Temp:  [97.9 F (36.6 C)-98.4 F (36.9 C)] 98.4 F (36.9 C) (10/16 0435) Pulse Rate:  [67-69] 69 (10/16 0435) Resp:  [16-74] 18 (10/16 0435) BP: (121-151)/(45-75) 141/64 mmHg (10/16 0435) SpO2:  [96 %-100 %] 98 % (10/16 0435) Last BM Date: 10/05/13 General: looks better.   Heart: RRR Chest: clear bil.  No resp distress Abdomen: soft, NT, ND.  Active BS  Extremities: no CCE Neuro/Psych:  Pleasant, no confusion, relaxed.   Intake/Output from previous day: 10/15 0701 - 10/16 0700 In: 860 [P.O.:360; I.V.:500] Out: -   Intake/Output this shift:    Lab Results:  Recent Labs  10/03/13 1951 10/04/13 0446 10/05/13 0420  WBC 5.4 4.8 5.7  HGB 5.1* 7.9* 8.2*  HCT 18.8* 25.8* 26.8*  PLT 448* 358 361   BMET  Recent Labs  10/03/13 1356 10/03/13 1951 10/04/13 0446  NA 137 140 137  K 3.8 4.6 3.9  CL 105 106 105  CO2 23 22 24   GLUCOSE 104* 99 85  BUN 10 14 13   CREATININE 0.5 0.64 0.57  CALCIUM 9.2 9.2 8.4   LFT  Recent Labs  10/03/13 1951  PROT 7.0  ALBUMIN 3.9  AST 26  ALT 17  ALKPHOS 53  BILITOT 0.3   PT/INR  Recent Labs  10/03/13 1356 10/03/13 1951  LABPROT 10.6 12.7  INR 1.0 0.97     ASSESMENT: * Acute anemia, microcytic. FOB +. This is a chronic issue. Full work up ( excluding EGD) in 07/2012.  Not compliant with po Iron.  S/p 2 units PRBCs. S/p parenteral iron infusion. Hgb improved EGD 10/15 confirmed known large 5 cm HH, superficial Cameron erosions (likely cause of chronic blood loss and associated IDA) * + nuclear cardiac stress test. Unremarkable echo. SOB and chest pressure secondary to profound anemia. No cardiac enzymes ordered. Her sxs have resolved post transfusion. Not sure if Cardiology will be pursuing Cath or not, for now it is cancelled.  *  Osteoporosis. Uses weekly Fosamax, this may not be safe in pt with large HH.     PLAN: *  Serial H & H.  Can be arranged with Maryelizabeth Rowan MD here in [place], who will be pt's new PMD *  Retry po Iron (take at night to minimize experience of GI s/e). *  Pt was not taking Plavix PTA and it was not Rx'd at admission, inadvertently ordered post EGD:  Will stop this.  *  GI follow up PRN   LOS: 2 days   Jennye Moccasin  10/05/2013, 8:37 AM Attending MD note:   I have reviewed the above note,  Start Iron po, recheck H/H  By Dr dewey.  [facility] Pager # [phone]

## 2013-10-06 ENCOUNTER — Ambulatory Visit (INDEPENDENT_AMBULATORY_CARE_PROVIDER_SITE_OTHER): Payer: Medicare Other | Admitting: Cardiology

## 2013-10-06 ENCOUNTER — Ambulatory Visit: Payer: Medicare Other | Admitting: Cardiology

## 2013-10-06 ENCOUNTER — Encounter: Payer: Self-pay | Admitting: Cardiology

## 2013-10-06 VITALS — BP 140/82 | HR 83 | Ht 61.0 in | Wt 131.0 lb

## 2013-10-06 DIAGNOSIS — R0789 Other chest pain: Secondary | ICD-10-CM | POA: Diagnosis not present

## 2013-10-06 NOTE — Patient Instructions (Signed)
The current medical regimen is effective;  continue present plan and medications.  Follow up in 4 months with Dr Hochrein.  You will receive a letter in the mail 2 months before you are due.  Please call us when you receive this letter to schedule your follow up appointment.  

## 2013-10-06 NOTE — Progress Notes (Signed)
HPI  the patient presents for followup of an abnormal stress test. She presented for evaluation of chest discomfort and dyspnea as described previously. She did have a new diagnosis of a murmur and was found to have some mild aortic stenosis on echo. Followup stress testing suggested anteroseptal and apical mild ischemia with a well preserved ejection fraction.   However, when she was set up to have a heart cath  She was found to be profoundly anemic. 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 She has been treated with transfusion iron and PPIs. Since going home she's walked down her yard which would have caused chest pain and shortness of breath and she did not have this sensation. She has had no new chest pressure, neck or arm discomfort. She's had no resting shortness of breath, PND or edema.   No Known Allergies  Current Outpatient Prescriptions  Medication Sig Dispense Refill  . alendronate (FOSAMAX) 70 MG tablet Take 70 mg by mouth every 7 (seven) days. Take with a full glass of water on an empty stomach.      Marland Kitchen aspirin EC 81 MG tablet Take 1 tablet (81 mg total) by mouth daily.      . Calcium Carbonate-Vitamin D (CALCIUM + D PO) Take 1 capsule by mouth daily.      . fenofibrate 54 MG tablet Take 54 mg by mouth daily.      . ferrous sulfate 325 (65 FE) MG tablet Take 1 tablet (325 mg total) by mouth daily with breakfast.  30 tablet  3  . lisinopril (PRINIVIL,ZESTRIL) 30 MG tablet Take 15 mg by mouth daily.      Marland Kitchen NITROSTAT 0.4 MG SL tablet       . Omeprazole (PRILOSEC PO) Take 1 tablet by mouth daily.       No current facility-administered medications for this visit.    Past Medical History  Diagnosis Date  . Diverticulosis   . Diverticulitis   . Hypertension   . GERD (gastroesophageal reflux disease)   . Nephrolithiasis     "passed them on my own; went away after I quit drinking tea" (10/04/2013)  . Hyperlipidemia   . Cyst of ovary, right 2013    "/US" (10/04/2013)    . Shortness of breath     "related to low blood level" (10/04/2013)  . Anemia   . History of blood transfusion     "just today; my HgB is 5.1" (10/04/2013)  . Osteoporosis     Past Surgical History  Procedure Laterality Date  . Left oophorectomy Left 1981  . Abdominal hysterectomy  1981  . Esophagogastroduodenoscopy N/A 10/04/2013    Procedure: ESOPHAGOGASTRODUODENOSCOPY (EGD);  Surgeon: Hart Carwin, MD;  Location: [facility] ENDOSCOPY;  Service: Endoscopy;  Laterality: N/A;  Elease Hashimoto dilation N/A 10/04/2013    Procedure: Elease Hashimoto DILATION;  Surgeon: Hart Carwin, MD;  Location: [facility] ENDOSCOPY;  Service: Endoscopy;  Laterality: N/A;    ROS:  As stated in the HPI and negative for all other systems.  PHYSICAL EXAM BP 140/82  Pulse 83  Ht 5\' 1"  (1.549 m)  Wt 131 lb (59.421 kg)  BMI 24.76 kg/m2 GENERAL:  Well appearing NECK:  No jugular venous distention, waveform within normal limits, carotid upstroke brisk and symmetric, no bruits, no thyromegaly LUNGS:  Clear to auscultation bilaterally BACK:  No CVA tenderness CHEST:  Unremarkable HEART:  PMI not displaced or sustained,S1 and S2 within normal limits, no S3, no S4, no  clicks, no rubs, 2/6 apical  Systolic murmur radiating out the aortic outflow tract, no diastolic murmurs ABD:  Flat, positive bowel sounds normal in frequency in pitch, no bruits, no rebound, no guarding, no midline pulsatile mass, no hepatomegaly, no splenomegaly EXT:  2 plus pulses throughout, no edema, no cyanosis no clubbing   ASSESSMENT AND PLAN  ABNORMAL STRESS TEST:   This is a low risk scan. Given the alternative explanation for her complaints cardiac catheterization is not indicated. However, she will let me know if she has continued symptoms in the future as her blood count normalizes.  AS:   This is mild. We will follow this clinically.

## 2013-10-06 NOTE — Telephone Encounter (Signed)
Pt has an appt at 4 pm today

## 2013-10-06 NOTE — Telephone Encounter (Signed)
Notified patient that Dr. Antoine Poche reviewed yesterday's message and he confirmed that he does want to see her today at 4:00pm. Patient stated that she will keep the appointment today.

## 2013-10-12 DIAGNOSIS — I10 Essential (primary) hypertension: Secondary | ICD-10-CM | POA: Diagnosis not present

## 2013-10-12 DIAGNOSIS — E785 Hyperlipidemia, unspecified: Secondary | ICD-10-CM | POA: Diagnosis not present

## 2013-10-12 DIAGNOSIS — D500 Iron deficiency anemia secondary to blood loss (chronic): Secondary | ICD-10-CM | POA: Diagnosis not present

## 2013-10-12 DIAGNOSIS — I080 Rheumatic disorders of both mitral and aortic valves: Secondary | ICD-10-CM | POA: Diagnosis not present

## 2013-10-13 DIAGNOSIS — R404 Transient alteration of awareness: Secondary | ICD-10-CM | POA: Diagnosis not present

## 2013-10-13 DIAGNOSIS — I080 Rheumatic disorders of both mitral and aortic valves: Secondary | ICD-10-CM | POA: Diagnosis not present

## 2013-10-13 DIAGNOSIS — I10 Essential (primary) hypertension: Secondary | ICD-10-CM | POA: Diagnosis not present

## 2013-10-19 DIAGNOSIS — I10 Essential (primary) hypertension: Secondary | ICD-10-CM | POA: Diagnosis not present

## 2013-10-19 DIAGNOSIS — D500 Iron deficiency anemia secondary to blood loss (chronic): Secondary | ICD-10-CM | POA: Diagnosis not present

## 2013-10-26 DIAGNOSIS — K256 Chronic or unspecified gastric ulcer with both hemorrhage and perforation: Secondary | ICD-10-CM | POA: Diagnosis not present

## 2013-10-26 DIAGNOSIS — I10 Essential (primary) hypertension: Secondary | ICD-10-CM | POA: Diagnosis not present

## 2013-10-26 DIAGNOSIS — I080 Rheumatic disorders of both mitral and aortic valves: Secondary | ICD-10-CM | POA: Diagnosis not present

## 2013-10-26 DIAGNOSIS — D500 Iron deficiency anemia secondary to blood loss (chronic): Secondary | ICD-10-CM | POA: Diagnosis not present

## 2013-11-22 DIAGNOSIS — I10 Essential (primary) hypertension: Secondary | ICD-10-CM | POA: Diagnosis not present

## 2013-11-22 DIAGNOSIS — D500 Iron deficiency anemia secondary to blood loss (chronic): Secondary | ICD-10-CM | POA: Diagnosis not present

## 2013-11-22 DIAGNOSIS — N390 Urinary tract infection, site not specified: Secondary | ICD-10-CM | POA: Diagnosis not present

## 2013-11-22 DIAGNOSIS — E782 Mixed hyperlipidemia: Secondary | ICD-10-CM | POA: Diagnosis not present

## 2013-12-27 DIAGNOSIS — H2510 Age-related nuclear cataract, unspecified eye: Secondary | ICD-10-CM | POA: Diagnosis not present

## 2014-02-20 ENCOUNTER — Encounter: Payer: Self-pay | Admitting: *Deleted

## 2014-02-21 DIAGNOSIS — K256 Chronic or unspecified gastric ulcer with both hemorrhage and perforation: Secondary | ICD-10-CM | POA: Diagnosis not present

## 2014-02-21 DIAGNOSIS — E782 Mixed hyperlipidemia: Secondary | ICD-10-CM | POA: Diagnosis not present

## 2014-02-21 DIAGNOSIS — I10 Essential (primary) hypertension: Secondary | ICD-10-CM | POA: Diagnosis not present

## 2014-02-21 DIAGNOSIS — D500 Iron deficiency anemia secondary to blood loss (chronic): Secondary | ICD-10-CM | POA: Diagnosis not present

## 2014-02-23 ENCOUNTER — Encounter: Payer: Self-pay | Admitting: Cardiology

## 2014-02-23 ENCOUNTER — Ambulatory Visit (INDEPENDENT_AMBULATORY_CARE_PROVIDER_SITE_OTHER): Payer: Medicare Other | Admitting: Cardiology

## 2014-02-23 VITALS — BP 145/68 | HR 65 | Ht 61.0 in | Wt 135.0 lb

## 2014-02-23 DIAGNOSIS — R0789 Other chest pain: Secondary | ICD-10-CM | POA: Diagnosis not present

## 2014-02-23 NOTE — Patient Instructions (Signed)
The current medical regimen is effective;  continue present plan and medications.  Follow up as needed 

## 2014-02-23 NOTE — Progress Notes (Signed)
HPI  the patient presents for followup of an abnormal stress test. She presented for evaluation of chest discomfort and dyspnea as described previously. She did have a new diagnosis of a murmur and was found to have some mild aortic stenosis on echo. Followup stress testing suggested anteroseptal and apical mild ischemia with a well preserved ejection fraction.   However, when she was set up to have a heart cath  She was found to be profoundly anemic. 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 She has been treated with transfusion iron and PPIs. Since that time she has had no further symptoms. She's able to be active. She denies any chest pressure, neck or arm discomfort. She has no palpitations, presyncope or syncope.  No Known Allergies  Current Outpatient Prescriptions  Medication Sig Dispense Refill  . aspirin EC 81 MG tablet Take 1 tablet (81 mg total) by mouth daily.      . Calcium Carbonate-Vitamin D (CALCIUM + D PO) Take 1 capsule by mouth daily.      . fenofibrate 54 MG tablet Take 54 mg by mouth daily.      . ferrous sulfate 325 (65 FE) MG tablet Take 1 tablet (325 mg total) by mouth daily with breakfast.  30 tablet  3  . lisinopril (PRINIVIL,ZESTRIL) 30 MG tablet Take 5 mg by mouth daily.       Marland Kitchen NITROSTAT 0.4 MG SL tablet        No current facility-administered medications for this visit.    Past Medical History  Diagnosis Date  . Diverticulosis   . Diverticulitis   . Hypertension   . GERD (gastroesophageal reflux disease)   . Nephrolithiasis     "passed them on my own; went away after I quit drinking tea" (10/04/2013)  . Hyperlipidemia   . Cyst of ovary, right 2013    "/US" (10/04/2013)  . Shortness of breath     "related to low blood level" (10/04/2013)  . Anemia   . History of blood transfusion     "just today; my HgB is 5.1" (10/04/2013)  . Osteoporosis     Past Surgical History  Procedure Laterality Date  . Left oophorectomy Left 1981  . Abdominal  hysterectomy  1981  . Esophagogastroduodenoscopy N/A 10/04/2013    Procedure: ESOPHAGOGASTRODUODENOSCOPY (EGD);  Surgeon: Hart Carwin, MD;  Location: [facility] ENDOSCOPY;  Service: Endoscopy;  Laterality: N/A;  Elease Hashimoto dilation N/A 10/04/2013    Procedure: Elease Hashimoto DILATION;  Surgeon: Hart Carwin, MD;  Location: [facility] ENDOSCOPY;  Service: Endoscopy;  Laterality: N/A;    ROS:  As stated in the HPI and negative for all other systems.  PHYSICAL EXAM BP 145/68  Pulse 65  Ht 5\' 1"  (1.549 m)  Wt 135 lb (61.236 kg)  BMI 25.52 kg/m2 GENERAL:  Well appearing NECK:  No jugular venous distention, waveform within normal limits, carotid upstroke brisk and symmetric, no bruits, no thyromegaly LUNGS:  Clear to auscultation bilaterally BACK:   CHEST:  Unremarkable HEART:  PMI not displaced or sustained,S1 and S2 within normal limits, no S3, no S4, no clicks, no rubs, 2/6 apical  Systolic murmur radiating out the aortic outflow tract, no diastolic murmurs ABD:  Flat, positive bowel sounds normal in frequency in pitch, no bruits, no rebound, no guarding, no midline pulsatile mass, no hepatomegaly, no splenomegaly EXT:  2 plus pulses throughout, no edema, no cyanosis no clubbing  EKG:  And normal sinus rhythm, rate of 65, incomplete  right bundle branch block, no acute ST-T wave changes.  02/23/2014  ASSESSMENT AND PLAN  ABNORMAL STRESS TEST:   This is a low risk scan. She has no symptoms.  No change in therapy is indicated.   AS:   This is mild. We will follow this clinically.

## 2014-04-20 DIAGNOSIS — H25049 Posterior subcapsular polar age-related cataract, unspecified eye: Secondary | ICD-10-CM | POA: Diagnosis not present

## 2014-04-20 DIAGNOSIS — H25019 Cortical age-related cataract, unspecified eye: Secondary | ICD-10-CM | POA: Diagnosis not present

## 2014-04-20 DIAGNOSIS — H02839 Dermatochalasis of unspecified eye, unspecified eyelid: Secondary | ICD-10-CM | POA: Diagnosis not present

## 2014-04-20 DIAGNOSIS — H2510 Age-related nuclear cataract, unspecified eye: Secondary | ICD-10-CM | POA: Diagnosis not present

## 2014-04-20 DIAGNOSIS — H18419 Arcus senilis, unspecified eye: Secondary | ICD-10-CM | POA: Diagnosis not present

## 2014-05-28 DIAGNOSIS — K256 Chronic or unspecified gastric ulcer with both hemorrhage and perforation: Secondary | ICD-10-CM | POA: Diagnosis not present

## 2014-05-28 DIAGNOSIS — E559 Vitamin D deficiency, unspecified: Secondary | ICD-10-CM | POA: Diagnosis not present

## 2014-05-28 DIAGNOSIS — D500 Iron deficiency anemia secondary to blood loss (chronic): Secondary | ICD-10-CM | POA: Diagnosis not present

## 2014-05-28 DIAGNOSIS — I10 Essential (primary) hypertension: Secondary | ICD-10-CM | POA: Diagnosis not present

## 2014-06-11 DIAGNOSIS — H269 Unspecified cataract: Secondary | ICD-10-CM | POA: Diagnosis not present

## 2014-06-11 DIAGNOSIS — H2510 Age-related nuclear cataract, unspecified eye: Secondary | ICD-10-CM | POA: Diagnosis not present

## 2014-06-12 DIAGNOSIS — H2510 Age-related nuclear cataract, unspecified eye: Secondary | ICD-10-CM | POA: Diagnosis not present

## 2014-06-19 DIAGNOSIS — Z1231 Encounter for screening mammogram for malignant neoplasm of breast: Secondary | ICD-10-CM | POA: Diagnosis not present

## 2014-06-28 DIAGNOSIS — R922 Inconclusive mammogram: Secondary | ICD-10-CM | POA: Diagnosis not present

## 2014-06-28 DIAGNOSIS — R928 Other abnormal and inconclusive findings on diagnostic imaging of breast: Secondary | ICD-10-CM | POA: Diagnosis not present

## 2014-06-29 DIAGNOSIS — H269 Unspecified cataract: Secondary | ICD-10-CM | POA: Diagnosis not present

## 2014-06-29 DIAGNOSIS — H2510 Age-related nuclear cataract, unspecified eye: Secondary | ICD-10-CM | POA: Diagnosis not present

## 2014-06-29 DIAGNOSIS — H2589 Other age-related cataract: Secondary | ICD-10-CM | POA: Diagnosis not present

## 2014-07-11 DIAGNOSIS — N816 Rectocele: Secondary | ICD-10-CM | POA: Diagnosis not present

## 2014-07-11 DIAGNOSIS — Z0000 Encounter for general adult medical examination without abnormal findings: Secondary | ICD-10-CM | POA: Diagnosis not present

## 2014-07-11 DIAGNOSIS — Z01419 Encounter for gynecological examination (general) (routine) without abnormal findings: Secondary | ICD-10-CM | POA: Diagnosis not present

## 2014-08-28 DIAGNOSIS — I10 Essential (primary) hypertension: Secondary | ICD-10-CM | POA: Diagnosis not present

## 2014-08-28 DIAGNOSIS — E782 Mixed hyperlipidemia: Secondary | ICD-10-CM | POA: Diagnosis not present

## 2014-08-28 DIAGNOSIS — D500 Iron deficiency anemia secondary to blood loss (chronic): Secondary | ICD-10-CM | POA: Diagnosis not present

## 2014-08-28 DIAGNOSIS — K256 Chronic or unspecified gastric ulcer with both hemorrhage and perforation: Secondary | ICD-10-CM | POA: Diagnosis not present

## 2014-11-28 DIAGNOSIS — R011 Cardiac murmur, unspecified: Secondary | ICD-10-CM | POA: Diagnosis not present

## 2014-11-28 DIAGNOSIS — I10 Essential (primary) hypertension: Secondary | ICD-10-CM | POA: Diagnosis not present

## 2014-11-28 DIAGNOSIS — D500 Iron deficiency anemia secondary to blood loss (chronic): Secondary | ICD-10-CM | POA: Diagnosis not present

## 2014-11-28 DIAGNOSIS — E782 Mixed hyperlipidemia: Secondary | ICD-10-CM | POA: Diagnosis not present

## 2014-11-28 DIAGNOSIS — D649 Anemia, unspecified: Secondary | ICD-10-CM | POA: Diagnosis not present

## 2014-11-28 DIAGNOSIS — E559 Vitamin D deficiency, unspecified: Secondary | ICD-10-CM | POA: Diagnosis not present

## 2015-05-08 DIAGNOSIS — E559 Vitamin D deficiency, unspecified: Secondary | ICD-10-CM | POA: Diagnosis not present

## 2015-05-08 DIAGNOSIS — Z23 Encounter for immunization: Secondary | ICD-10-CM | POA: Diagnosis not present

## 2015-05-08 DIAGNOSIS — I10 Essential (primary) hypertension: Secondary | ICD-10-CM | POA: Diagnosis not present

## 2015-05-08 DIAGNOSIS — D649 Anemia, unspecified: Secondary | ICD-10-CM | POA: Diagnosis not present

## 2015-05-08 DIAGNOSIS — Z79899 Other long term (current) drug therapy: Secondary | ICD-10-CM | POA: Diagnosis not present

## 2015-05-08 DIAGNOSIS — D500 Iron deficiency anemia secondary to blood loss (chronic): Secondary | ICD-10-CM | POA: Diagnosis not present

## 2015-05-08 DIAGNOSIS — E782 Mixed hyperlipidemia: Secondary | ICD-10-CM | POA: Diagnosis not present

## 2015-07-05 DIAGNOSIS — Z1231 Encounter for screening mammogram for malignant neoplasm of breast: Secondary | ICD-10-CM | POA: Diagnosis not present

## 2015-11-05 DIAGNOSIS — E782 Mixed hyperlipidemia: Secondary | ICD-10-CM | POA: Diagnosis not present

## 2015-11-05 DIAGNOSIS — E559 Vitamin D deficiency, unspecified: Secondary | ICD-10-CM | POA: Diagnosis not present

## 2015-11-06 DIAGNOSIS — I10 Essential (primary) hypertension: Secondary | ICD-10-CM | POA: Diagnosis not present

## 2015-11-06 DIAGNOSIS — D500 Iron deficiency anemia secondary to blood loss (chronic): Secondary | ICD-10-CM | POA: Diagnosis not present

## 2015-11-06 DIAGNOSIS — Z0000 Encounter for general adult medical examination without abnormal findings: Secondary | ICD-10-CM | POA: Diagnosis not present

## 2015-11-06 DIAGNOSIS — E559 Vitamin D deficiency, unspecified: Secondary | ICD-10-CM | POA: Diagnosis not present

## 2015-11-06 DIAGNOSIS — E782 Mixed hyperlipidemia: Secondary | ICD-10-CM | POA: Diagnosis not present

## 2016-06-03 DIAGNOSIS — H2513 Age-related nuclear cataract, bilateral: Secondary | ICD-10-CM | POA: Diagnosis not present

## 2016-06-08 DIAGNOSIS — R5383 Other fatigue: Secondary | ICD-10-CM | POA: Diagnosis not present

## 2016-06-08 DIAGNOSIS — D500 Iron deficiency anemia secondary to blood loss (chronic): Secondary | ICD-10-CM | POA: Diagnosis not present

## 2016-06-08 DIAGNOSIS — E782 Mixed hyperlipidemia: Secondary | ICD-10-CM | POA: Diagnosis not present

## 2016-06-08 DIAGNOSIS — E559 Vitamin D deficiency, unspecified: Secondary | ICD-10-CM | POA: Diagnosis not present

## 2016-06-10 DIAGNOSIS — E782 Mixed hyperlipidemia: Secondary | ICD-10-CM | POA: Diagnosis not present

## 2016-06-10 DIAGNOSIS — I10 Essential (primary) hypertension: Secondary | ICD-10-CM | POA: Diagnosis not present

## 2016-06-10 DIAGNOSIS — D500 Iron deficiency anemia secondary to blood loss (chronic): Secondary | ICD-10-CM | POA: Diagnosis not present

## 2016-06-10 DIAGNOSIS — Z23 Encounter for immunization: Secondary | ICD-10-CM | POA: Diagnosis not present

## 2016-06-10 DIAGNOSIS — M19049 Primary osteoarthritis, unspecified hand: Secondary | ICD-10-CM | POA: Diagnosis not present

## 2016-06-29 DIAGNOSIS — Z0000 Encounter for general adult medical examination without abnormal findings: Secondary | ICD-10-CM | POA: Diagnosis not present

## 2016-07-10 DIAGNOSIS — Z1231 Encounter for screening mammogram for malignant neoplasm of breast: Secondary | ICD-10-CM | POA: Diagnosis not present

## 2016-07-31 DIAGNOSIS — H1859 Other hereditary corneal dystrophies: Secondary | ICD-10-CM | POA: Diagnosis not present

## 2016-07-31 DIAGNOSIS — H2513 Age-related nuclear cataract, bilateral: Secondary | ICD-10-CM | POA: Diagnosis not present

## 2016-07-31 DIAGNOSIS — H26492 Other secondary cataract, left eye: Secondary | ICD-10-CM | POA: Diagnosis not present

## 2016-07-31 DIAGNOSIS — H18411 Arcus senilis, right eye: Secondary | ICD-10-CM | POA: Diagnosis not present

## 2016-07-31 DIAGNOSIS — I10 Essential (primary) hypertension: Secondary | ICD-10-CM | POA: Diagnosis not present

## 2016-07-31 DIAGNOSIS — H18412 Arcus senilis, left eye: Secondary | ICD-10-CM | POA: Diagnosis not present

## 2016-08-25 ENCOUNTER — Other Ambulatory Visit: Payer: Self-pay

## 2016-08-28 DIAGNOSIS — H26491 Other secondary cataract, right eye: Secondary | ICD-10-CM | POA: Diagnosis not present

## 2016-08-28 DIAGNOSIS — H2513 Age-related nuclear cataract, bilateral: Secondary | ICD-10-CM | POA: Diagnosis not present

## 2017-03-08 DIAGNOSIS — E782 Mixed hyperlipidemia: Secondary | ICD-10-CM | POA: Diagnosis not present

## 2017-03-08 DIAGNOSIS — M810 Age-related osteoporosis without current pathological fracture: Secondary | ICD-10-CM | POA: Diagnosis not present

## 2017-03-08 DIAGNOSIS — D500 Iron deficiency anemia secondary to blood loss (chronic): Secondary | ICD-10-CM | POA: Diagnosis not present

## 2017-03-08 DIAGNOSIS — E559 Vitamin D deficiency, unspecified: Secondary | ICD-10-CM | POA: Diagnosis not present

## 2017-03-08 DIAGNOSIS — Z79899 Other long term (current) drug therapy: Secondary | ICD-10-CM | POA: Diagnosis not present

## 2017-03-09 DIAGNOSIS — D500 Iron deficiency anemia secondary to blood loss (chronic): Secondary | ICD-10-CM | POA: Diagnosis not present

## 2017-03-09 DIAGNOSIS — I10 Essential (primary) hypertension: Secondary | ICD-10-CM | POA: Diagnosis not present

## 2017-03-09 DIAGNOSIS — E782 Mixed hyperlipidemia: Secondary | ICD-10-CM | POA: Diagnosis not present

## 2017-03-09 DIAGNOSIS — Z6827 Body mass index (BMI) 27.0-27.9, adult: Secondary | ICD-10-CM | POA: Diagnosis not present

## 2017-06-30 DIAGNOSIS — Z0000 Encounter for general adult medical examination without abnormal findings: Secondary | ICD-10-CM | POA: Diagnosis not present

## 2017-06-30 DIAGNOSIS — I10 Essential (primary) hypertension: Secondary | ICD-10-CM | POA: Diagnosis not present

## 2017-06-30 DIAGNOSIS — Z6827 Body mass index (BMI) 27.0-27.9, adult: Secondary | ICD-10-CM | POA: Diagnosis not present

## 2017-07-16 DIAGNOSIS — Z1231 Encounter for screening mammogram for malignant neoplasm of breast: Secondary | ICD-10-CM | POA: Diagnosis not present

## 2017-09-10 DIAGNOSIS — N3000 Acute cystitis without hematuria: Secondary | ICD-10-CM | POA: Diagnosis not present

## 2017-09-10 DIAGNOSIS — N390 Urinary tract infection, site not specified: Secondary | ICD-10-CM | POA: Diagnosis not present

## 2017-09-10 DIAGNOSIS — Z6827 Body mass index (BMI) 27.0-27.9, adult: Secondary | ICD-10-CM | POA: Diagnosis not present

## 2017-09-10 DIAGNOSIS — Z23 Encounter for immunization: Secondary | ICD-10-CM | POA: Diagnosis not present

## 2017-09-10 DIAGNOSIS — K529 Noninfective gastroenteritis and colitis, unspecified: Secondary | ICD-10-CM | POA: Diagnosis not present

## 2017-09-18 ENCOUNTER — Inpatient Hospital Stay (HOSPITAL_COMMUNITY)
Admission: EM | Admit: 2017-09-18 | Discharge: 2017-09-20 | DRG: 690 | Disposition: A | Discharge status: Home / Self Care | Payer: Medicare Other | Attending: Internal Medicine | Admitting: Internal Medicine

## 2017-09-18 ENCOUNTER — Encounter (HOSPITAL_COMMUNITY): Payer: Self-pay | Admitting: Emergency Medicine

## 2017-09-18 DIAGNOSIS — K5730 Diverticulosis of large intestine without perforation or abscess without bleeding: Secondary | ICD-10-CM | POA: Diagnosis not present

## 2017-09-18 DIAGNOSIS — N12 Tubulo-interstitial nephritis, not specified as acute or chronic: Secondary | ICD-10-CM | POA: Diagnosis not present

## 2017-09-18 DIAGNOSIS — R0989 Other specified symptoms and signs involving the circulatory and respiratory systems: Secondary | ICD-10-CM | POA: Diagnosis present

## 2017-09-18 DIAGNOSIS — I4510 Unspecified right bundle-branch block: Secondary | ICD-10-CM | POA: Diagnosis present

## 2017-09-18 DIAGNOSIS — J811 Chronic pulmonary edema: Secondary | ICD-10-CM | POA: Diagnosis not present

## 2017-09-18 DIAGNOSIS — I10 Essential (primary) hypertension: Secondary | ICD-10-CM | POA: Diagnosis not present

## 2017-09-18 DIAGNOSIS — N3001 Acute cystitis with hematuria: Secondary | ICD-10-CM | POA: Diagnosis not present

## 2017-09-18 DIAGNOSIS — J90 Pleural effusion, not elsewhere classified: Secondary | ICD-10-CM | POA: Diagnosis not present

## 2017-09-18 DIAGNOSIS — Z7982 Long term (current) use of aspirin: Secondary | ICD-10-CM

## 2017-09-18 DIAGNOSIS — Z87891 Personal history of nicotine dependence: Secondary | ICD-10-CM

## 2017-09-18 DIAGNOSIS — K449 Diaphragmatic hernia without obstruction or gangrene: Secondary | ICD-10-CM | POA: Diagnosis not present

## 2017-09-18 DIAGNOSIS — J810 Acute pulmonary edema: Secondary | ICD-10-CM

## 2017-09-18 DIAGNOSIS — E785 Hyperlipidemia, unspecified: Secondary | ICD-10-CM | POA: Diagnosis present

## 2017-09-18 DIAGNOSIS — M810 Age-related osteoporosis without current pathological fracture: Secondary | ICD-10-CM | POA: Diagnosis present

## 2017-09-18 DIAGNOSIS — K219 Gastro-esophageal reflux disease without esophagitis: Secondary | ICD-10-CM | POA: Diagnosis present

## 2017-09-18 DIAGNOSIS — Z87442 Personal history of urinary calculi: Secondary | ICD-10-CM

## 2017-09-18 NOTE — ED Notes (Signed)
Pt called for triage with no response. 

## 2017-09-18 NOTE — ED Triage Notes (Signed)
Patient is complaining of lower right and left pain. Patient states she went to [facility] and was diagnosed with a urinary infection. Patient states that she is on an antibiotic but is having severe pain still.

## 2017-09-19 ENCOUNTER — Emergency Department (HOSPITAL_COMMUNITY): Payer: Medicare Other

## 2017-09-19 ENCOUNTER — Encounter (HOSPITAL_COMMUNITY): Payer: Self-pay | Admitting: Internal Medicine

## 2017-09-19 ENCOUNTER — Inpatient Hospital Stay (HOSPITAL_COMMUNITY): Payer: Medicare Other

## 2017-09-19 DIAGNOSIS — K219 Gastro-esophageal reflux disease without esophagitis: Secondary | ICD-10-CM | POA: Diagnosis present

## 2017-09-19 DIAGNOSIS — M810 Age-related osteoporosis without current pathological fracture: Secondary | ICD-10-CM | POA: Diagnosis present

## 2017-09-19 DIAGNOSIS — N12 Tubulo-interstitial nephritis, not specified as acute or chronic: Secondary | ICD-10-CM | POA: Diagnosis not present

## 2017-09-19 DIAGNOSIS — Z87442 Personal history of urinary calculi: Secondary | ICD-10-CM | POA: Diagnosis not present

## 2017-09-19 DIAGNOSIS — K449 Diaphragmatic hernia without obstruction or gangrene: Secondary | ICD-10-CM | POA: Diagnosis present

## 2017-09-19 DIAGNOSIS — E785 Hyperlipidemia, unspecified: Secondary | ICD-10-CM | POA: Diagnosis present

## 2017-09-19 DIAGNOSIS — R0609 Other forms of dyspnea: Secondary | ICD-10-CM | POA: Diagnosis not present

## 2017-09-19 DIAGNOSIS — I4510 Unspecified right bundle-branch block: Secondary | ICD-10-CM | POA: Diagnosis present

## 2017-09-19 DIAGNOSIS — Z7982 Long term (current) use of aspirin: Secondary | ICD-10-CM | POA: Diagnosis not present

## 2017-09-19 DIAGNOSIS — K5730 Diverticulosis of large intestine without perforation or abscess without bleeding: Secondary | ICD-10-CM | POA: Diagnosis present

## 2017-09-19 DIAGNOSIS — R0989 Other specified symptoms and signs involving the circulatory and respiratory systems: Secondary | ICD-10-CM | POA: Diagnosis present

## 2017-09-19 DIAGNOSIS — I10 Essential (primary) hypertension: Secondary | ICD-10-CM | POA: Diagnosis present

## 2017-09-19 DIAGNOSIS — J90 Pleural effusion, not elsewhere classified: Secondary | ICD-10-CM | POA: Diagnosis not present

## 2017-09-19 DIAGNOSIS — Z87891 Personal history of nicotine dependence: Secondary | ICD-10-CM | POA: Diagnosis not present

## 2017-09-19 DIAGNOSIS — J811 Chronic pulmonary edema: Secondary | ICD-10-CM | POA: Diagnosis present

## 2017-09-19 LAB — URINALYSIS, ROUTINE W REFLEX MICROSCOPIC
Bilirubin Urine: NEGATIVE
Glucose, UA: NEGATIVE mg/dL
Ketones, ur: NEGATIVE mg/dL
Nitrite: NEGATIVE
Protein, ur: NEGATIVE mg/dL
Specific Gravity, Urine: 1.016 (ref 1.005–1.030)
pH: 6 (ref 5.0–8.0)

## 2017-09-19 LAB — COMPREHENSIVE METABOLIC PANEL
ALT: 12 U/L — AB (ref 14–54)
AST: 21 U/L (ref 15–41)
Albumin: 3.8 g/dL (ref 3.5–5.0)
Alkaline Phosphatase: 65 U/L (ref 38–126)
Anion gap: 8 (ref 5–15)
BUN: 10 mg/dL (ref 6–20)
CALCIUM: 8.9 mg/dL (ref 8.9–10.3)
CO2: 24 mmol/L (ref 22–32)
CREATININE: 0.56 mg/dL (ref 0.44–1.00)
Chloride: 106 mmol/L (ref 101–111)
GFR calc non Af Amer: >60 mL/min (ref >60)
GLUCOSE: 100 mg/dL — AB (ref 65–99)
Potassium: 3.7 mmol/L (ref 3.5–5.1)
SODIUM: 138 mmol/L (ref 135–145)
Total Bilirubin: 0.8 mg/dL (ref 0.3–1.2)
Total Protein: 7.2 g/dL (ref 6.5–8.1)

## 2017-09-19 LAB — CBC
HCT: 37.3 % (ref 36.0–46.0)
Hemoglobin: 12.2 g/dL (ref 12.0–15.0)
MCH: 30.6 pg (ref 26.0–34.0)
MCHC: 32.7 g/dL (ref 30.0–36.0)
MCV: 93.5 fL (ref 78.0–100.0)
PLATELETS: 390 10*3/uL (ref 150–400)
RBC: 3.99 MIL/uL (ref 3.87–5.11)
RDW: 12.4 % (ref 11.5–15.5)
WBC: 9.5 10*3/uL (ref 4.0–10.5)

## 2017-09-19 LAB — BRAIN NATRIURETIC PEPTIDE: B NATRIURETIC PEPTIDE 5: 48.1 pg/mL (ref 0.0–100.0)

## 2017-09-19 LAB — ECHOCARDIOGRAM COMPLETE
Height: 62 in
WEIGHTICAEL: 2240 [oz_av]

## 2017-09-19 LAB — LIPASE, BLOOD: Lipase: 22 U/L (ref 11–51)

## 2017-09-19 MED ORDER — DEXTROSE 5 % IV SOLN
1.0000 g | Freq: Once | INTRAVENOUS | Status: AC
  Administered 2017-09-19: 1 g via INTRAVENOUS
  Filled 2017-09-19: qty 10

## 2017-09-19 MED ORDER — ASPIRIN EC 81 MG PO TBEC
81.0000 mg | DELAYED_RELEASE_TABLET | Freq: Every day | ORAL | Status: DC
  Administered 2017-09-19 – 2017-09-20 (×2): 81 mg via ORAL
  Filled 2017-09-19 (×2): qty 1

## 2017-09-19 MED ORDER — FUROSEMIDE 10 MG/ML IJ SOLN
40.0000 mg | Freq: Once | INTRAMUSCULAR | Status: AC
  Administered 2017-09-19: 40 mg via INTRAVENOUS
  Filled 2017-09-19: qty 4

## 2017-09-19 MED ORDER — POTASSIUM CHLORIDE CRYS ER 20 MEQ PO TBCR
30.0000 meq | EXTENDED_RELEASE_TABLET | Freq: Once | ORAL | Status: AC
  Administered 2017-09-19: 06:00:00 30 meq via ORAL
  Filled 2017-09-19: qty 1

## 2017-09-19 MED ORDER — KETOROLAC TROMETHAMINE 30 MG/ML IJ SOLN
15.0000 mg | Freq: Once | INTRAMUSCULAR | Status: AC
  Administered 2017-09-19: 15 mg via INTRAVENOUS
  Filled 2017-09-19: qty 1

## 2017-09-19 MED ORDER — ONDANSETRON HCL 4 MG/2ML IJ SOLN: 4.0000 mg | Freq: Four times a day (QID) | INTRAMUSCULAR | Status: DC | PRN

## 2017-09-19 MED ORDER — ONDANSETRON HCL 4 MG PO TABS: 4.0000 mg | ORAL_TABLET | Freq: Four times a day (QID) | ORAL | Status: DC | PRN

## 2017-09-19 MED ORDER — PANTOPRAZOLE SODIUM 40 MG PO TBEC
40.0000 mg | DELAYED_RELEASE_TABLET | Freq: Every day | ORAL | Status: DC
  Administered 2017-09-19 – 2017-09-20 (×2): 40 mg via ORAL
  Filled 2017-09-19 (×2): qty 1

## 2017-09-19 MED ORDER — OXYCODONE HCL 5 MG PO TABS: 5.0000 mg | ORAL_TABLET | Freq: Four times a day (QID) | ORAL | Status: DC | PRN

## 2017-09-19 MED ORDER — FENOFIBRATE 54 MG PO TABS
54.0000 mg | ORAL_TABLET | Freq: Every day | ORAL | Status: DC
  Administered 2017-09-19 – 2017-09-20 (×2): 54 mg via ORAL
  Filled 2017-09-19 (×2): qty 1

## 2017-09-19 MED ORDER — FERROUS SULFATE 325 (65 FE) MG PO TABS
325.0000 mg | ORAL_TABLET | Freq: Every day | ORAL | Status: DC
  Administered 2017-09-19 – 2017-09-20 (×2): 325 mg via ORAL
  Filled 2017-09-19 (×2): qty 1

## 2017-09-19 MED ORDER — DEXTROSE 5 % IV SOLN
1.0000 g | INTRAVENOUS | Status: DC
  Administered 2017-09-20: 1 g via INTRAVENOUS
  Filled 2017-09-19: qty 10

## 2017-09-19 MED ORDER — ACETAMINOPHEN 325 MG PO TABS: 650.0000 mg | ORAL_TABLET | Freq: Four times a day (QID) | ORAL | Status: DC | PRN

## 2017-09-19 MED ORDER — LOSARTAN POTASSIUM 25 MG PO TABS
25.0000 mg | ORAL_TABLET | Freq: Every day | ORAL | Status: DC
  Administered 2017-09-19 – 2017-09-20 (×2): 25 mg via ORAL
  Filled 2017-09-19 (×2): qty 1

## 2017-09-19 MED ORDER — ENOXAPARIN SODIUM 40 MG/0.4ML ~~LOC~~ SOLN
40.0000 mg | SUBCUTANEOUS | Status: DC
  Administered 2017-09-19: 40 mg via SUBCUTANEOUS
  Filled 2017-09-19 (×2): qty 0.4

## 2017-09-19 NOTE — ED Provider Notes (Signed)
[facility]-EMERGENCY DEPT Provider Note   CSN: [id number] Arrival date & time: 09/18/17  2006     History   Chief Complaint Chief Complaint  Patient presents with  . Abdominal Pain    HPI Christine Morse is a 81 y.o. female.  The history is provided by the patient.  Abdominal Pain   This is a new problem. The current episode started yesterday. The problem occurs constantly. The problem has not changed since onset.The pain is associated with an unknown (Had UTI a week ago and completed a course of medication) factor. The pain is located in the suprapubic region. The quality of the pain is burning and cramping. Pertinent negatives include fever, flatus and vomiting. Nothing aggravates the symptoms. Nothing relieves the symptoms.    Past Medical History:  Diagnosis Date  . Anemia   . Cyst of ovary, right 2013   "/US" (10/04/2013)  . Diverticulitis   . Diverticulosis   . GERD (gastroesophageal reflux disease)   . History of blood transfusion    "just today; my HgB is 5.1" (10/04/2013)  . Hyperlipidemia   . Hypertension   . Nephrolithiasis    "passed them on my own; went away after I quit drinking tea" (10/04/2013)  . Osteoporosis   . Shortness of breath    "related to low blood level" (10/04/2013)    Patient Active Problem List   Diagnosis Date Noted  . Cameron ulcer 10/05/2013  . Anemia due to chronic blood loss 10/04/2013  . Other specified gastritis without mention of hemorrhage 10/04/2013  . Chest tightness 10/03/2013  . VITAMIN D DEFICIENCY 04/08/2009  . GERD 03/26/2009  . DIVERTICULOSIS, COLON 03/26/2009  . DIARRHEA 03/26/2009  . NEPHROLITHIASIS, HX OF 03/26/2009    Past Surgical History:  Procedure Laterality Date  . ABDOMINAL HYSTERECTOMY  1981  . ESOPHAGOGASTRODUODENOSCOPY N/A 10/04/2013   Procedure: ESOPHAGOGASTRODUODENOSCOPY (EGD);  Surgeon: Hart Carwin, MD;  Location: [facility] ENDOSCOPY;  Service: Endoscopy;  Laterality: N/A;  . LEFT OOPHORECTOMY Left 1981  .  MALONEY DILATION N/A 10/04/2013   Procedure: Elease Hashimoto DILATION;  Surgeon: Hart Carwin, MD;  Location: [facility] ENDOSCOPY;  Service: Endoscopy;  Laterality: N/A;    OB History    No data available       Home Medications    Prior to Admission medications   Medication Sig Start Date End Date Taking? Authorizing Provider  aspirin EC 81 MG tablet Take 1 tablet (81 mg total) by mouth daily. 08/23/13   Andrena Mews, DO  Calcium Carbonate-Vitamin D (CALCIUM + D PO) Take 1 capsule by mouth daily.    [provider]  fenofibrate 54 MG tablet Take 54 mg by mouth daily.    [provider]  ferrous sulfate 325 (65 FE) MG tablet Take 1 tablet (325 mg total) by mouth daily with breakfast. 10/05/13   Christiane Ha, MD  lisinopril (PRINIVIL,ZESTRIL) 30 MG tablet Take 5 mg by mouth daily.     [provider]  NITROSTAT 0.4 MG SL tablet  10/03/13   [provider]    Family History Family History  Problem Relation Age of Onset  . Colon cancer Neg Hx   . Ovarian cancer Sister     Social History Social History  Substance Use Topics  . Smoking status: Former Smoker    Years: 0.50    Types: Cigarettes  . Smokeless tobacco: Never Used  . Alcohol use No     Allergies   Patient has no  known allergies.   Review of Systems Review of Systems  Constitutional: Negative for fever.  Cardiovascular: Negative for chest pain.  Gastrointestinal: Positive for abdominal pain. Negative for flatus and vomiting.  Genitourinary: Negative for flank pain.  All other systems reviewed and are negative.    Physical Exam Updated Vital Signs BP 137/69 (BP Location: Right Arm)   Pulse 88   Temp 98.9 F (37.2 C) (Oral)   Resp 18   Ht  (1.575 m)   Wt 63.5 kg (140 lb)   SpO2 100%   BMI 25.61 kg/m   Physical Exam  Constitutional: She appears well-developed and well-nourished.  HENT:  Head: Normocephalic and atraumatic.  Mouth/Throat: No oropharyngeal  exudate.  Eyes: Pupils are equal, round, and reactive to light. Conjunctivae are normal.  Neck: Normal range of motion. Neck supple. No JVD present.  Cardiovascular: Normal rate, regular rhythm, normal heart sounds and intact distal pulses.   Pulmonary/Chest: She has no rales.  Abdominal: Soft. Bowel sounds are normal. She exhibits no mass. There is no tenderness. There is no rebound and no guarding.  Musculoskeletal: Normal range of motion. She exhibits no edema.  Neurological: She is alert.  Skin: Skin is warm and dry. Capillary refill takes less than 2 seconds. She is not diaphoretic.  Psychiatric: She has a normal mood and affect.     ED Treatments / Results  Labs (all labs ordered are listed, but only abnormal results are displayed) Labs Reviewed  COMPREHENSIVE METABOLIC PANEL - Abnormal; Notable for the following:       Result Value   Glucose, Bld 100 (*)    ALT 12 (*)    All other components within normal limits  URINALYSIS, ROUTINE W REFLEX MICROSCOPIC - Abnormal; Notable for the following:    APPearance HAZY (*)    Hgb urine dipstick MODERATE (*)    Leukocytes, UA LARGE (*)    Bacteria, UA RARE (*)    Squamous Epithelial / LPF 6-30 (*)    Crystals PRESENT (*)    All other components within normal limits  LIPASE, BLOOD  CBC  BRAIN NATRIURETIC PEPTIDE  BRAIN NATRIURETIC PEPTIDE    EKG  EKG Interpretation None       Radiology Dg Chest 2 View  Result Date: 09/19/2017 CLINICAL DATA:  Abdominal pain. EXAM: CHEST  2 VIEW COMPARISON:  Radiograph 10/03/2013. Lung bases from abdominal CT earlier this day. FINDINGS: Unchanged heart size and mediastinal contours with aortic tortuosity and atherosclerosis. Mild to moderate pulmonary edema. Small pleural effusions, better demonstrated on abdominal CT. No confluent airspace disease or pneumothorax. Degenerative change in the spine. Stable sclerotic foci in the proximal humeri, enchondroma versus bone infarcts. IMPRESSION:  Moderate pulmonary edema.  Small pleural effusions. Electronically Signed   By: Rubye Oaks M.D.   On: 09/19/2017 01:57   Ct Renal Stone Study  Result Date: 09/19/2017 CLINICAL DATA:  81 y/o F; lower abdominal pain and urinary tract infection. EXAM: CT ABDOMEN AND PELVIS WITHOUT CONTRAST TECHNIQUE: Multidetector CT imaging of the abdomen and pelvis was performed following the standard protocol without IV contrast. COMPARISON:  01/13/2006 CT abdomen and pelvis. FINDINGS: Lower chest: Smooth interlobular septal thickening compatible with mild interstitial edema. Moderate hiatal hernia. Hepatobiliary: No focal liver abnormality is seen. No gallstones, gallbladder wall thickening, or biliary dilatation. Pancreas: Unremarkable. No pancreatic ductal dilatation or surrounding inflammatory changes. Spleen: Normal in size without focal abnormality. Adrenals/Urinary Tract: Adrenal glands are unremarkable. Kidneys are normal, without renal calculi, focal  lesion, or hydronephrosis. Bladder is unremarkable. Stomach/Bowel: Stomach is within normal limits. Extensive sigmoid diverticulosis with small volume of edema in the left hemipelvis. No bowel obstruction. Vascular/Lymphatic: Aortic atherosclerosis. No enlarged abdominal or pelvic lymph nodes. Reproductive: Status post hysterectomy. No adnexal masses. Other: No hernia. Musculoskeletal: Mild S-shaped curvature of the lumbar spine. Advanced discogenic degenerative changes with multilevel mild to moderate bony foraminal and canal stenosis. IMPRESSION: 1. Extensive sigmoid diverticulosis and adjacent small volume of edema in the left hemipelvis which may represent mild diverticulitis. 2. Interstitial pulmonary edema. 3. Moderate hiatal hernia. 4. Aortic atherosclerosis. 5. Moderate S-shaped curvature of lumbar spine and advanced discogenic degenerative changes. Electronically Signed   By: Mitzi Hansen M.D.   On: 09/19/2017 00:59    Procedures Procedures  (including critical care time)  Medications Ordered in ED Medications  cefTRIAXone (ROCEPHIN) 1 g in dextrose 5 % 50 mL IVPB (1 g Intravenous New Bag/Given 09/19/17 0250)  furosemide (LASIX) injection 40 mg (40 mg Intravenous Given 09/19/17 0249)  ketorolac (TORADOL) 30 MG/ML injection 15 mg (15 mg Intravenous Given 09/19/17 0244)      Final Clinical Impressions(s) / ED Diagnoses   Final diagnoses:  Acute cystitis with hematuria  and pulmonary edema: admit to medicine   New Prescriptions New Prescriptions   No medications on file     Tanner Yeley, MD 09/20/17 0127

## 2017-09-19 NOTE — Progress Notes (Signed)
  Echocardiogram 2D Echocardiogram has been performed.  Christine Morse M 09/19/2017, 11:12 AM

## 2017-09-19 NOTE — H&P (Signed)
History and Physical    Christine Morse WUJ:[id number] DOB: 1936-03-29 DOA: 09/18/2017  PCP: [name], MD   Patient coming from: Home.  I have personally briefly reviewed patient's old medical records in [facility]  Chief Complaint: Lower abdominal pain.  HPI: Christine Morse is a 81 y.o. female with medical history significant of history of anemia, right ovarian cyst, diverticulosis, history of diverticulitis, GERD, hyperlipidemia, hypertension, nephrolithiasis, osteoporosis, dyspnea who is coming to the emergency department with complaints of suprapubic and lower abdomen pain. She mentions that she went to [place] and was diagnosed there with an UTI and has been taking oral antibiotics for the past week or so. However, she continues having pain, mild nausea, dysuria and frequency. She has also had fever episodes since yesterday. She denies headache, sore throat, dyspnea, chest pain, palpitations, dizziness, diaphoresis, PND, orthopnea or recent pitting edema of the lower extremities. She denies emesis, constipation, melena or hematochezia. She gets frequent loose stools due to history of diverticulosis.  ED Course: Initial vital signs minutes he department temperature 98.59F, pulse 93, respirations 16, blood pressure 157 73 mmHg and O2 sat 99% on room air. She received a gram of Rocephin IV piggyback, Toradol 50 mg IVP 1 and furosemide 40 mg IVP 1.  Her workup showed an urinalysis with moderate hemoglobinuria, significant pyuria and rare bacteria. Her WBC is 9.5, hemoglobin 12.2 g/dL and platelets 956. Her CMP showed a glucose of 100 mg/dL and an AST of 12, all other values were within normal range. Her BNP was 48.1 pg/mL. EKG showed sinus rhythm with an incomplete right bundle branch block, which is similar to previous tracing.  Imaging: Her chest radiograph showed moderate pulmonary edema. CT renal stone protocol showed extensive diverticulosis with small volume of edema,  which raises the questions for diverticulitis. Moderate hiatal hernia. Aortic atherosclerosis. Please see images in full radiology report for further detail.  Review of Systems: As per HPI otherwise 10 point review of systems negative.    Past Medical History:  Diagnosis Date  . Anemia   . Cyst of ovary, right 2013   "/US" (10/04/2013)  . Diverticulitis   . Diverticulosis   . GERD (gastroesophageal reflux disease)   . History of blood transfusion    "just today; my HgB is 5.1" (10/04/2013)  . Hyperlipidemia   . Hypertension   . Nephrolithiasis    "passed them on my own; went away after I quit drinking tea" (10/04/2013)  . Osteoporosis   . Shortness of breath    "related to low blood level" (10/04/2013)    Past Surgical History:  Procedure Laterality Date  . ABDOMINAL HYSTERECTOMY  1981  . ESOPHAGOGASTRODUODENOSCOPY N/A 10/04/2013   Procedure: ESOPHAGOGASTRODUODENOSCOPY (EGD);  Surgeon: Hart Carwin, MD;  Location: [facility] ENDOSCOPY;  Service: Endoscopy;  Laterality: N/A;  . LEFT OOPHORECTOMY Left 1981  . MALONEY DILATION N/A 10/04/2013   Procedure: Elease Hashimoto DILATION;  Surgeon: Hart Carwin, MD;  Location: [facility] ENDOSCOPY;  Service: Endoscopy;  Laterality: N/A;     reports that she has quit smoking. Her smoking use included Cigarettes. She quit after 0.50 years of use. She has never used smokeless tobacco. She reports that she does not drink alcohol or use drugs.  No Known Allergies  Family History  Problem Relation Age of Onset  . Colon cancer Neg Hx   . Ovarian cancer Sister     Prior to Admission medications   Medication Sig Start Date End Date Taking? Authorizing  Provider  acetaminophen (TYLENOL) 500 MG tablet Take 1,000 mg by mouth every 6 (six) hours as needed for mild pain, moderate pain or headache.   Yes [provider]  aspirin EC 81 MG tablet Take 1 tablet (81 mg total) by mouth daily. 08/23/13  Yes Andrena Mews, DO  Calcium Carbonate-Vitamin D (CALCIUM +  D PO) Take 2 tablets by mouth daily.    Yes [provider]  fenofibrate 54 MG tablet Take 54 mg by mouth daily.   Yes [provider]  ferrous sulfate 325 (65 FE) MG tablet Take 1 tablet (325 mg total) by mouth daily with breakfast. 10/05/13  Yes Christiane Ha, MD  losartan (COZAAR) 25 MG tablet Take 25 mg by mouth daily. 08/11/17  Yes [provider]  omeprazole (PRILOSEC) 40 MG capsule Take 40 mg by mouth daily after breakfast. 08/11/17  Yes [provider]    Physical Exam: Vitals:   09/18/17 2027 09/18/17 2306 09/19/17 0154  BP: (!) 157/73 126/80 137/69  Pulse: 93 78 88  Resp: Temp: 98.9 F (37.2 C) 98.9 F (37.2 C)   TempSrc: Oral Oral   SpO2: 99% 98% 100%  Weight: 63.5 kg (140 lb)    Height:  (1.575 m)      Constitutional: NAD, calm, comfortable Eyes: PERRL, lids and conjunctivae normal ENMT: Mucous membranes are moist. Posterior pharynx clear of any exudate or lesions. Neck: normal, supple, no masses, no thyromegaly Respiratory: clear to auscultation bilaterally, no wheezing, no crackles. Normal respiratory effort. No accessory muscle use.  Cardiovascular: Regular rate and rhythm, no murmurs / rubs / gallops. No extremity edema. 2+ pedal pulses. No carotid bruits.  Abdomen: Soft, positive suprapubic, LLQ and left flank tenderness, no guarding/masses palpated. No hepatosplenomegaly. Bowel sounds positive.  Musculoskeletal: no clubbing / cyanosis.  Good ROM, no contractures. Normal muscle tone.  Skin: no rashes, lesions, ulcers on limited skin exam. Neurologic: CN 2-12 grossly intact. Sensation intact, DTR normal. Strength 5/5 in all 4.  Psychiatric: Normal judgment and insight. Alert and oriented x 4. Normal mood.     Labs on Admission: I have personally reviewed following labs and imaging studies  CBC:  Recent Labs Lab 09/18/17 2326  WBC 9.5  HGB 12.2  HCT 37.3  MCV 93.5  PLT 390   Basic Metabolic  Panel:  Recent Labs Lab 09/18/17 2326  NA 138  K 3.7  CL 106  CO2 24  GLUCOSE 100*  BUN 10  CREATININE 0.56  CALCIUM 8.9   GFR: Estimated Creatinine Clearance: 50.8 mL/min (by C-G formula based on SCr of 0.56 mg/dL). Liver Function Tests:  Recent Labs Lab 09/18/17 2326  AST 21  ALT 12*  ALKPHOS 65  BILITOT 0.8  PROT 7.2  ALBUMIN 3.8    Recent Labs Lab 09/18/17 2326  LIPASE 22   No results for input(s): AMMONIA in the last 168 hours. Coagulation Profile: No results for input(s): INR, PROTIME in the last 168 hours. Cardiac Enzymes: No results for input(s): CKTOTAL, CKMB, CKMBINDEX, TROPONINI in the last 168 hours. BNP (last 3 results) No results for input(s): PROBNP in the last 8760 hours. HbA1C: No results for input(s): HGBA1C in the last 72 hours. CBG: No results for input(s): GLUCAP in the last 168 hours. Lipid Profile: No results for input(s): CHOL, HDL, LDLCALC, TRIG, CHOLHDL, LDLDIRECT in the last 72 hours. Thyroid Function Tests: No results for input(s): TSH, T4TOTAL, FREET4, T3FREE, THYROIDAB in the last  72 hours. Anemia Panel: No results for input(s): VITAMINB12, FOLATE, FERRITIN, TIBC, IRON, RETICCTPCT in the last 72 hours. Urine analysis:    Component Value Date/Time   COLORURINE YELLOW 09/18/2017 2306   APPEARANCEUR HAZY (A) 09/18/2017 2306   LABSPEC 1.016 09/18/2017 2306   PHURINE 6.0 09/18/2017 2306   GLUCOSEU NEGATIVE 09/18/2017 2306   HGBUR MODERATE (A) 09/18/2017 2306   BILIRUBINUR NEGATIVE 09/18/2017 2306   KETONESUR NEGATIVE 09/18/2017 2306   PROTEINUR NEGATIVE 09/18/2017 2306   NITRITE NEGATIVE 09/18/2017 2306   LEUKOCYTESUR LARGE (A) 09/18/2017 2306    Radiological Exams on Admission: Dg Chest 2 View  Result Date: 09/19/2017 CLINICAL DATA:  Abdominal pain. EXAM: CHEST  2 VIEW COMPARISON:  Radiograph 10/03/2013. Lung bases from abdominal CT earlier this day. FINDINGS: Unchanged heart size and mediastinal contours with aortic  tortuosity and atherosclerosis. Mild to moderate pulmonary edema. Small pleural effusions, better demonstrated on abdominal CT. No confluent airspace disease or pneumothorax. Degenerative change in the spine. Stable sclerotic foci in the proximal humeri, enchondroma versus bone infarcts. IMPRESSION: Moderate pulmonary edema.  Small pleural effusions. Electronically Signed   By: Rubye Oaks M.D.   On: 09/19/2017 01:57   Ct Renal Stone Study  Result Date: 09/19/2017 CLINICAL DATA:  81 y/o F; lower abdominal pain and urinary tract infection. EXAM: CT ABDOMEN AND PELVIS WITHOUT CONTRAST TECHNIQUE: Multidetector CT imaging of the abdomen and pelvis was performed following the standard protocol without IV contrast. COMPARISON:  01/13/2006 CT abdomen and pelvis. FINDINGS: Lower chest: Smooth interlobular septal thickening compatible with mild interstitial edema. Moderate hiatal hernia. Hepatobiliary: No focal liver abnormality is seen. No gallstones, gallbladder wall thickening, or biliary dilatation. Pancreas: Unremarkable. No pancreatic ductal dilatation or surrounding inflammatory changes. Spleen: Normal in size without focal abnormality. Adrenals/Urinary Tract: Adrenal glands are unremarkable. Kidneys are normal, without renal calculi, focal lesion, or hydronephrosis. Bladder is unremarkable. Stomach/Bowel: Stomach is within normal limits. Extensive sigmoid diverticulosis with small volume of edema in the left hemipelvis. No bowel obstruction. Vascular/Lymphatic: Aortic atherosclerosis. No enlarged abdominal or pelvic lymph nodes. Reproductive: Status post hysterectomy. No adnexal masses. Other: No hernia. Musculoskeletal: Mild S-shaped curvature of the lumbar spine. Advanced discogenic degenerative changes with multilevel mild to moderate bony foraminal and canal stenosis. IMPRESSION: 1. Extensive sigmoid diverticulosis and adjacent small volume of edema in the left hemipelvis which may represent mild  diverticulitis. 2. Interstitial pulmonary edema. 3. Moderate hiatal hernia. 4. Aortic atherosclerosis. 5. Moderate S-shaped curvature of lumbar spine and advanced discogenic degenerative changes. Electronically Signed   By: Mitzi Hansen M.D.   On: 09/19/2017 00:59    EKG: Independently reviewed. Vent. rate 81 BPM PR interval * ms QRS duration 116 ms QT/QTc 386/448 ms P-R-T axes 53 38 34 Sinus rhythm Incomplete right bundle branch block No significant changes when compared to previous tracing from March 2015  Assessment/Plan Principal Problem:   Pyelonephritis Admit to telemetry/inpatient Continue ceftriaxone 1 g IVPB every 24 hours. Consider starting IV fluids if echocardiogram results allow. Continue analgesics as needed. Follow-up urine culture and sensitivity.  Active Problems:     NEPHROLITHIASIS, HX OF No uroliths seen on imaging.    GERD Protonix 40 mg by mouth daily.    Essential hypertension Continue losartan 25 mg by mouth daily. Monitor blood pressure, renal function and electrolytes.    Hyperlipidemia Continue fenofibrate 54 mg by mouth daily    Pulmonary vascular congestion She does not look volume overloaded. BNP is normal. Will check echocardiogram later today.  DVT prophylaxis: Lovenox SQ. Code Status: Full code. Family Communication: Her daughter Meriam Sprague was present in the ED. Disposition Plan: Admit for IV antibiotic therapy and further workup. Consults called:  Admission status: Inpatient/telemetry.   Bobette Mo MD [facility] Pager [phone].  If 7PM-7AM, please contact night-coverage [email or web address] Password [email or web address]  09/19/2017, 3:28 AM

## 2017-09-19 NOTE — Progress Notes (Signed)
  PROGRESS NOTE  Patient admitted earlier this morning. See H&P. Christine Morse is a 81 y.o. female with medical history significant of history of anemia, right ovarian cyst, diverticulosis, history of diverticulitis, GERD, hyperlipidemia, hypertension, nephrolithiasis, osteoporosis, dyspnea who is coming to the emergency department with complaints of suprapubic and right lower abdomen pain. She mentions that she went to [place] and was diagnosed there with an UTI and has been taking oral antibiotics for the past week. However, she continues having pain, mild nausea, dysuria and frequency. She has also had fever episodes since yesterday. She gets frequent loose stools due to history of diverticulosis but denies worsening diarrhea. CT renal stone protocol showed extensive diverticulosis with small volume of edema in the left hemipelvis which raises the questions for diverticulitis. Moderate hiatal hernia.   Her abdominal pain is in RLQ, inconsistent with CT finding. Doubt diverticulitis Continue rocephin, UA pending  Echo completed with good EF. Does not appear volume overloaded on exam.    Noralee Stain, DO [facility] [email or web address] Password [email or web address] 09/19/2017, 2:23 PM

## 2017-09-19 NOTE — ED Notes (Signed)
Call report to Dessa Ledee at [phone] at [phone]

## 2017-09-20 LAB — BASIC METABOLIC PANEL
ANION GAP: 5 (ref 5–15)
BUN: 12 mg/dL (ref 6–20)
CHLORIDE: 108 mmol/L (ref 101–111)
CO2: 25 mmol/L (ref 22–32)
Calcium: 8.7 mg/dL — ABNORMAL LOW (ref 8.9–10.3)
Creatinine, Ser: 0.54 mg/dL (ref 0.44–1.00)
GFR calc Af Amer: >60 mL/min (ref >60)
GFR calc non Af Amer: >60 mL/min (ref >60)
Glucose, Bld: 98 mg/dL (ref 65–99)
POTASSIUM: 4.1 mmol/L (ref 3.5–5.1)
SODIUM: 138 mmol/L (ref 135–145)

## 2017-09-20 LAB — CBC WITH DIFFERENTIAL/PLATELET
Basophils Absolute: 0 10*3/uL (ref 0.0–0.1)
Basophils Relative: 1 %
EOS ABS: 1 10*3/uL — AB (ref 0.0–0.7)
Eosinophils Relative: 19 %
HCT: 32.6 % — ABNORMAL LOW (ref 36.0–46.0)
HEMOGLOBIN: 10.7 g/dL — AB (ref 12.0–15.0)
LYMPHS ABS: 1.5 10*3/uL (ref 0.7–4.0)
LYMPHS PCT: 27 %
MCH: 30.5 pg (ref 26.0–34.0)
MCHC: 32.8 g/dL (ref 30.0–36.0)
MCV: 92.9 fL (ref 78.0–100.0)
Monocytes Absolute: 0.7 10*3/uL (ref 0.1–1.0)
Monocytes Relative: 13 %
NEUTROS PCT: 40 %
Neutro Abs: 2.2 10*3/uL (ref 1.7–7.7)
Platelets: 343 10*3/uL (ref 150–400)
RBC: 3.51 MIL/uL — AB (ref 3.87–5.11)
RDW: 12.5 % (ref 11.5–15.5)
WBC: 5.4 10*3/uL (ref 4.0–10.5)

## 2017-09-20 MED ORDER — CEFPODOXIME PROXETIL 100 MG PO TABS: 100.0000 mg | ORAL_TABLET | Freq: Two times a day (BID) | ORAL | 0 refills | Status: AC

## 2017-09-20 NOTE — Progress Notes (Signed)
D/C instructions reviewed w/ pt. Pt verbalizes understanding and all questions answered. Pt in possession of d/c packet and all personal belongings. Awaiting daughter who is en route.

## 2017-09-20 NOTE — Progress Notes (Signed)
PT Cancellation Note  Patient Details Name: KEZIA BENEVIDES MRN: [id number] DOB: 1936-10-19   Cancelled Treatment:    Reason Eval/Treat Not Completed: PT screened, no needs identified, will sign off   Rebeca Alert, MPT Pager: [phone]

## 2017-09-20 NOTE — Progress Notes (Signed)
Pt states she has no needs at present time.

## 2017-09-20 NOTE — Discharge Summary (Signed)
Physician Discharge Summary  Christine Morse EAV:[id number] DOB: 02/08/1936 DOA: 09/18/2017  PCP: Christine Moccasin, MD  Admit date: 09/18/2017 Discharge date: 09/20/2017  Admitted From: Home Disposition:  Home  Recommendations for Outpatient Follow-up:  1. Follow up with PCP in 1 week 2. Please follow up on the following pending results: Final blood culture and urine culture result  Discharge Condition: Stable CODE STATUS: Full  Diet recommendation: Heart healthy  Brief/Interim Summary: Christine Morse a 81 y.o.femalewith medical history significant of history of anemia, right ovarian cyst, diverticulosis, history of diverticulitis, GERD, hyperlipidemia, hypertension, nephrolithiasis, osteoporosis, dyspnea who is coming to the emergency department with complaints ofsuprapubic and right lower abdomen pain. She mentions that she went to [place] and was diagnosed there with an UTI and has been taking oral antibiotics for the past week. However, she continues having pain, mild nausea, dysuria and frequency. She has also had fever episodes since yesterday. She gets frequent loose stools due to history of diverticulosis but denies worsening diarrhea. CT renal stone protocol showed extensive diverticulosis with small volume of edema in the left hemipelvis which raises the questions for diverticulitis. Moderate hiatal hernia.  Patient was admitted for suspected pyelonephritis. Her abdominal pain was in the right lower quadrant which was inconsistent with CT finding of left hemipelvis edema. Echocardiogram was also completed as chest x-ray revealed pulmonary edema. Echocardiogram revealed EF 65%. She remained euvolemic on examination without any complaints of shortness of breath. Patient continued to improve symptomatically. On day of discharge, she was afebrile and feeling well, without leukocytosis. Urine culture was not completed yet, but microbiology lab stated no growth was found yet. Since  patient has been improving on Rocephin, her antibiotic was the escalated to [facility]. She will need follow-up with her primary care physician for final blood culture and urine culture results.  Discharge Diagnoses:  Principal Problem:   Pyelonephritis Active Problems:   GERD   NEPHROLITHIASIS, HX OF   Essential hypertension   Hyperlipidemia   Pulmonary vascular congestion   Discharge Instructions   Allergies as of 09/20/2017   No Known Allergies     Medication List    TAKE these medications   acetaminophen 500 MG tablet Commonly known as:  TYLENOL Take 1,000 mg by mouth every 6 (six) hours as needed for mild pain, moderate pain or headache.   aspirin EC 81 MG tablet Take 1 tablet (81 mg total) by mouth daily.   CALCIUM + D PO Take 2 tablets by mouth daily.   cefpodoxime 100 MG tablet Commonly known as:  VANTIN Take 1 tablet (100 mg total) by mouth 2 (two) times daily.   fenofibrate 54 MG tablet Take 54 mg by mouth daily.   ferrous sulfate 325 (65 FE) MG tablet Take 1 tablet (325 mg total) by mouth daily with breakfast.   losartan 25 MG tablet Commonly known as:  COZAAR Take 25 mg by mouth daily.   omeprazole 40 MG capsule Commonly known as:  PRILOSEC Take 40 mg by mouth daily after breakfast.       No Known Allergies  Consultations:  None   Procedures/Studies: Dg Chest 2 View  Result Date: 09/19/2017 CLINICAL DATA:  Abdominal pain. EXAM: CHEST  2 VIEW COMPARISON:  Radiograph 10/03/2013. Lung bases from abdominal CT earlier this day. FINDINGS: Unchanged heart size and mediastinal contours with aortic tortuosity and atherosclerosis. Mild to moderate pulmonary edema. Small pleural effusions, better demonstrated on abdominal CT. No confluent airspace disease or pneumothorax. Degenerative change  in the spine. Stable sclerotic foci in the proximal humeri, enchondroma versus bone infarcts. IMPRESSION: Moderate pulmonary edema.  Small pleural effusions.  Electronically Signed   By: Rubye Oaks M.D.   On: 09/19/2017 01:57   Ct Renal Stone Study  Result Date: 09/19/2017 CLINICAL DATA:  81 y/o F; lower abdominal pain and urinary tract infection. EXAM: CT ABDOMEN AND PELVIS WITHOUT CONTRAST TECHNIQUE: Multidetector CT imaging of the abdomen and pelvis was performed following the standard protocol without IV contrast. COMPARISON:  01/13/2006 CT abdomen and pelvis. FINDINGS: Lower chest: Smooth interlobular septal thickening compatible with mild interstitial edema. Moderate hiatal hernia. Hepatobiliary: No focal liver abnormality is seen. No gallstones, gallbladder wall thickening, or biliary dilatation. Pancreas: Unremarkable. No pancreatic ductal dilatation or surrounding inflammatory changes. Spleen: Normal in size without focal abnormality. Adrenals/Urinary Tract: Adrenal glands are unremarkable. Kidneys are normal, without renal calculi, focal lesion, or hydronephrosis. Bladder is unremarkable. Stomach/Bowel: Stomach is within normal limits. Extensive sigmoid diverticulosis with small volume of edema in the left hemipelvis. No bowel obstruction. Vascular/Lymphatic: Aortic atherosclerosis. No enlarged abdominal or pelvic lymph nodes. Reproductive: Status post hysterectomy. No adnexal masses. Other: No hernia. Musculoskeletal: Mild S-shaped curvature of the lumbar spine. Advanced discogenic degenerative changes with multilevel mild to moderate bony foraminal and canal stenosis. IMPRESSION: 1. Extensive sigmoid diverticulosis and adjacent small volume of edema in the left hemipelvis which may represent mild diverticulitis. 2. Interstitial pulmonary edema. 3. Moderate hiatal hernia. 4. Aortic atherosclerosis. 5. Moderate S-shaped curvature of lumbar spine and advanced discogenic degenerative changes. Electronically Signed   By: Mitzi Hansen M.D.   On: 09/19/2017 00:59     Echo Study Conclusions  - Left ventricle: The cavity size was normal.  Systolic function was   vigorous. The estimated ejection fraction was in the range of 65%   to 70%. Wall motion was normal; there were no regional wall   motion abnormalities. - Aortic valve: Mildly calcified annulus. Trileaflet; normal   thickness leaflets. - Mitral valve: There was mild to moderate regurgitation. - Pulmonary arteries: Systolic pressure was mildly increased. PA  peak pressure: 37 mm Hg (S).    Discharge Exam: Vitals:   09/20/17 0449 09/20/17 0957  BP: (!) 124/57 (!) 153/64  Pulse: 76 79  Resp: 18 18  Temp: 98.2 F (36.8 C) 97.9 F (36.6 C)  SpO2: 96% 98%   Vitals:   09/19/17 1541 09/19/17 2144 09/20/17 0449 09/20/17 0957  BP: (!) 135/55 (!) 126/55 (!) 124/57 (!) 153/64  Pulse: 81 73 76 79  Resp: Temp: 98.1 F (36.7 C) 98.7 F (37.1 C) 98.2 F (36.8 C) 97.9 F (36.6 C)  TempSrc: Oral Oral Oral Oral  SpO2: 98% 99% 96% 98%  Weight: 61.5 kg (135 lb 9.6 oz)     Height:  (1.575 m)       General: Pt is alert, awake, not in acute distress Cardiovascular: RRR, S1/S2 +, no rubs, no gallops Respiratory: CTA bilaterally, no wheezing, no rhonchi Abdominal: Soft, NT, ND, bowel sounds + Extremities: no edema, no cyanosis    The results of significant diagnostics from this hospitalization (including imaging, microbiology, ancillary and laboratory) are listed below for reference.     Microbiology: No results found for this or any previous visit (from the past 240 hour(s)).   Labs: BNP (last 3 results)  Recent Labs  09/18/17 2326  BNP 48.1   Basic Metabolic Panel:  Recent Labs Lab 09/18/17 2326 09/20/17 0433  NA 138 138  K 3.7 4.1  CL 106 108  CO2 24 25  GLUCOSE 100* 98  BUN 10 12  CREATININE 0.56 0.54  CALCIUM 8.9 8.7*   Liver Function Tests:  Recent Labs Lab 09/18/17 2326  AST 21  ALT 12*  ALKPHOS 65  BILITOT 0.8  PROT 7.2  ALBUMIN 3.8    Recent Labs Lab 09/18/17 2326  LIPASE 22   No results for input(s):  AMMONIA in the last 168 hours. CBC:  Recent Labs Lab 09/18/17 2326 09/20/17 0433  WBC 9.5 5.4  NEUTROABS  --  2.2  HGB 12.2 10.7*  HCT 37.3 32.6*  MCV 93.5 92.9  PLT 390 343   Cardiac Enzymes: No results for input(s): CKTOTAL, CKMB, CKMBINDEX, TROPONINI in the last 168 hours. BNP: Invalid input(s): POCBNP CBG: No results for input(s): GLUCAP in the last 168 hours. D-Dimer No results for input(s): DDIMER in the last 72 hours. Hgb A1c No results for input(s): HGBA1C in the last 72 hours. Lipid Profile No results for input(s): CHOL, HDL, LDLCALC, TRIG, CHOLHDL, LDLDIRECT in the last 72 hours. Thyroid function studies No results for input(s): TSH, T4TOTAL, T3FREE, THYROIDAB in the last 72 hours.  Invalid input(s): FREET3 Anemia work up No results for input(s): VITAMINB12, FOLATE, FERRITIN, TIBC, IRON, RETICCTPCT in the last 72 hours. Urinalysis    Component Value Date/Time   COLORURINE YELLOW 09/18/2017 2306   APPEARANCEUR HAZY (A) 09/18/2017 2306   LABSPEC 1.016 09/18/2017 2306   PHURINE 6.0 09/18/2017 2306   GLUCOSEU NEGATIVE 09/18/2017 2306   HGBUR MODERATE (A) 09/18/2017 2306   BILIRUBINUR NEGATIVE 09/18/2017 2306   KETONESUR NEGATIVE 09/18/2017 2306   PROTEINUR NEGATIVE 09/18/2017 2306   NITRITE NEGATIVE 09/18/2017 2306   LEUKOCYTESUR LARGE (A) 09/18/2017 2306   Sepsis Labs Invalid input(s): PROCALCITONIN,  WBC,  LACTICIDVEN Microbiology No results found for this or any previous visit (from the past 240 hour(s)).   Time coordinating discharge: 40 minutes  SIGNED:  Noralee Stain, DO [facility] Pager [phone]  If 7PM-7AM, please contact night-coverage [email or web address] Password [email or web address] 09/20/2017, 11:45 AM

## 2017-09-21 LAB — URINE CULTURE

## 2017-09-24 DIAGNOSIS — N12 Tubulo-interstitial nephritis, not specified as acute or chronic: Secondary | ICD-10-CM | POA: Diagnosis not present

## 2017-09-24 DIAGNOSIS — Z6827 Body mass index (BMI) 27.0-27.9, adult: Secondary | ICD-10-CM | POA: Diagnosis not present

## 2017-09-24 DIAGNOSIS — N390 Urinary tract infection, site not specified: Secondary | ICD-10-CM | POA: Diagnosis not present

## 2017-09-24 LAB — CULTURE, BLOOD (ROUTINE X 2)
Culture: NO GROWTH
Culture: NO GROWTH
SPECIAL REQUESTS: ADEQUATE
SPECIAL REQUESTS: ADEQUATE

## 2017-09-27 DIAGNOSIS — N12 Tubulo-interstitial nephritis, not specified as acute or chronic: Secondary | ICD-10-CM | POA: Diagnosis not present

## 2017-09-27 DIAGNOSIS — Z6827 Body mass index (BMI) 27.0-27.9, adult: Secondary | ICD-10-CM | POA: Diagnosis not present

## 2017-10-13 ENCOUNTER — Ambulatory Visit: Payer: Medicare Other | Admitting: Gastroenterology

## 2017-10-14 ENCOUNTER — Ambulatory Visit: Payer: Medicare Other | Admitting: Gastroenterology

## 2017-11-18 DIAGNOSIS — H2513 Age-related nuclear cataract, bilateral: Secondary | ICD-10-CM | POA: Diagnosis not present

## 2017-12-27 DIAGNOSIS — E559 Vitamin D deficiency, unspecified: Secondary | ICD-10-CM | POA: Diagnosis not present

## 2017-12-27 DIAGNOSIS — E782 Mixed hyperlipidemia: Secondary | ICD-10-CM | POA: Diagnosis not present

## 2017-12-27 DIAGNOSIS — Z79899 Other long term (current) drug therapy: Secondary | ICD-10-CM | POA: Diagnosis not present

## 2017-12-27 DIAGNOSIS — D500 Iron deficiency anemia secondary to blood loss (chronic): Secondary | ICD-10-CM | POA: Diagnosis not present

## 2017-12-27 DIAGNOSIS — I10 Essential (primary) hypertension: Secondary | ICD-10-CM | POA: Diagnosis not present

## 2017-12-29 DIAGNOSIS — Z6827 Body mass index (BMI) 27.0-27.9, adult: Secondary | ICD-10-CM | POA: Diagnosis not present

## 2017-12-29 DIAGNOSIS — E559 Vitamin D deficiency, unspecified: Secondary | ICD-10-CM | POA: Diagnosis not present

## 2017-12-29 DIAGNOSIS — D500 Iron deficiency anemia secondary to blood loss (chronic): Secondary | ICD-10-CM | POA: Diagnosis not present

## 2017-12-29 DIAGNOSIS — E782 Mixed hyperlipidemia: Secondary | ICD-10-CM | POA: Diagnosis not present

## 2017-12-29 DIAGNOSIS — I10 Essential (primary) hypertension: Secondary | ICD-10-CM | POA: Diagnosis not present

## 2018-03-28 DIAGNOSIS — D500 Iron deficiency anemia secondary to blood loss (chronic): Secondary | ICD-10-CM | POA: Diagnosis not present

## 2018-03-30 DIAGNOSIS — I10 Essential (primary) hypertension: Secondary | ICD-10-CM | POA: Diagnosis not present

## 2018-03-30 DIAGNOSIS — Z6826 Body mass index (BMI) 26.0-26.9, adult: Secondary | ICD-10-CM | POA: Diagnosis not present

## 2018-03-30 DIAGNOSIS — K219 Gastro-esophageal reflux disease without esophagitis: Secondary | ICD-10-CM | POA: Diagnosis not present

## 2018-03-30 DIAGNOSIS — D500 Iron deficiency anemia secondary to blood loss (chronic): Secondary | ICD-10-CM | POA: Diagnosis not present

## 2018-07-04 ENCOUNTER — Other Ambulatory Visit: Payer: Self-pay | Admitting: Family Medicine

## 2018-07-04 DIAGNOSIS — K219 Gastro-esophageal reflux disease without esophagitis: Secondary | ICD-10-CM | POA: Diagnosis not present

## 2018-07-04 DIAGNOSIS — I10 Essential (primary) hypertension: Secondary | ICD-10-CM | POA: Diagnosis not present

## 2018-07-04 DIAGNOSIS — M810 Age-related osteoporosis without current pathological fracture: Secondary | ICD-10-CM

## 2018-07-04 DIAGNOSIS — Z0000 Encounter for general adult medical examination without abnormal findings: Secondary | ICD-10-CM | POA: Diagnosis not present

## 2018-07-04 DIAGNOSIS — D649 Anemia, unspecified: Secondary | ICD-10-CM | POA: Diagnosis not present

## 2018-07-04 DIAGNOSIS — E559 Vitamin D deficiency, unspecified: Secondary | ICD-10-CM | POA: Diagnosis not present

## 2018-07-04 DIAGNOSIS — D500 Iron deficiency anemia secondary to blood loss (chronic): Secondary | ICD-10-CM | POA: Diagnosis not present

## 2018-07-04 DIAGNOSIS — Z6825 Body mass index (BMI) 25.0-25.9, adult: Secondary | ICD-10-CM | POA: Diagnosis not present

## 2018-07-04 DIAGNOSIS — M8580 Other specified disorders of bone density and structure, unspecified site: Secondary | ICD-10-CM

## 2018-08-15 ENCOUNTER — Ambulatory Visit
Admission: RE | Admit: 2018-08-15 | Discharge: 2018-08-15 | Disposition: A | Discharge status: Home / Self Care | Payer: Medicare Other | Source: Ambulatory Visit | Attending: Family Medicine | Admitting: Family Medicine

## 2018-08-15 DIAGNOSIS — M810 Age-related osteoporosis without current pathological fracture: Secondary | ICD-10-CM

## 2018-08-15 DIAGNOSIS — M8589 Other specified disorders of bone density and structure, multiple sites: Secondary | ICD-10-CM | POA: Diagnosis not present

## 2018-08-15 DIAGNOSIS — Z780 Asymptomatic menopausal state: Secondary | ICD-10-CM | POA: Diagnosis not present

## 2018-08-15 DIAGNOSIS — M8580 Other specified disorders of bone density and structure, unspecified site: Secondary | ICD-10-CM

## 2018-10-05 DIAGNOSIS — D500 Iron deficiency anemia secondary to blood loss (chronic): Secondary | ICD-10-CM | POA: Diagnosis not present

## 2018-10-11 DIAGNOSIS — D500 Iron deficiency anemia secondary to blood loss (chronic): Secondary | ICD-10-CM | POA: Diagnosis not present

## 2018-10-11 DIAGNOSIS — J309 Allergic rhinitis, unspecified: Secondary | ICD-10-CM | POA: Diagnosis not present

## 2018-10-11 DIAGNOSIS — I10 Essential (primary) hypertension: Secondary | ICD-10-CM | POA: Diagnosis not present

## 2018-10-11 DIAGNOSIS — H6122 Impacted cerumen, left ear: Secondary | ICD-10-CM | POA: Diagnosis not present

## 2018-10-20 DIAGNOSIS — J309 Allergic rhinitis, unspecified: Secondary | ICD-10-CM | POA: Diagnosis not present

## 2018-10-20 DIAGNOSIS — H6122 Impacted cerumen, left ear: Secondary | ICD-10-CM | POA: Diagnosis not present

## 2018-10-20 DIAGNOSIS — Z23 Encounter for immunization: Secondary | ICD-10-CM | POA: Diagnosis not present

## 2018-10-20 DIAGNOSIS — Z6826 Body mass index (BMI) 26.0-26.9, adult: Secondary | ICD-10-CM | POA: Diagnosis not present

## 2018-12-05 DIAGNOSIS — Z23 Encounter for immunization: Secondary | ICD-10-CM | POA: Diagnosis not present

## 2018-12-05 DIAGNOSIS — Z6827 Body mass index (BMI) 27.0-27.9, adult: Secondary | ICD-10-CM | POA: Diagnosis not present

## 2018-12-05 DIAGNOSIS — L03012 Cellulitis of left finger: Secondary | ICD-10-CM | POA: Diagnosis not present

## 2018-12-09 DIAGNOSIS — L03012 Cellulitis of left finger: Secondary | ICD-10-CM | POA: Diagnosis not present

## 2018-12-09 DIAGNOSIS — Z6827 Body mass index (BMI) 27.0-27.9, adult: Secondary | ICD-10-CM | POA: Diagnosis not present

## 2018-12-28 DIAGNOSIS — L03012 Cellulitis of left finger: Secondary | ICD-10-CM | POA: Diagnosis not present

## 2018-12-28 DIAGNOSIS — D500 Iron deficiency anemia secondary to blood loss (chronic): Secondary | ICD-10-CM | POA: Diagnosis not present

## 2018-12-28 DIAGNOSIS — Z79899 Other long term (current) drug therapy: Secondary | ICD-10-CM | POA: Diagnosis not present

## 2018-12-28 DIAGNOSIS — E559 Vitamin D deficiency, unspecified: Secondary | ICD-10-CM | POA: Diagnosis not present

## 2018-12-28 DIAGNOSIS — Z6827 Body mass index (BMI) 27.0-27.9, adult: Secondary | ICD-10-CM | POA: Diagnosis not present

## 2018-12-28 DIAGNOSIS — E782 Mixed hyperlipidemia: Secondary | ICD-10-CM | POA: Diagnosis not present

## 2018-12-28 DIAGNOSIS — I10 Essential (primary) hypertension: Secondary | ICD-10-CM | POA: Diagnosis not present

## 2018-12-30 ENCOUNTER — Other Ambulatory Visit: Payer: Self-pay | Admitting: Family Medicine

## 2018-12-30 ENCOUNTER — Ambulatory Visit
Admission: RE | Admit: 2018-12-30 | Discharge: 2018-12-30 | Disposition: A | Discharge status: Home / Self Care | Payer: Medicare Other | Source: Ambulatory Visit | Attending: Family Medicine | Admitting: Family Medicine

## 2018-12-30 DIAGNOSIS — R52 Pain, unspecified: Secondary | ICD-10-CM

## 2018-12-30 DIAGNOSIS — L03012 Cellulitis of left finger: Secondary | ICD-10-CM | POA: Diagnosis not present

## 2018-12-30 DIAGNOSIS — M7989 Other specified soft tissue disorders: Secondary | ICD-10-CM | POA: Diagnosis not present

## 2018-12-30 DIAGNOSIS — Z6827 Body mass index (BMI) 27.0-27.9, adult: Secondary | ICD-10-CM | POA: Diagnosis not present

## 2019-01-02 DIAGNOSIS — E782 Mixed hyperlipidemia: Secondary | ICD-10-CM | POA: Diagnosis not present

## 2019-01-02 DIAGNOSIS — I10 Essential (primary) hypertension: Secondary | ICD-10-CM | POA: Diagnosis not present

## 2019-01-02 DIAGNOSIS — Z6827 Body mass index (BMI) 27.0-27.9, adult: Secondary | ICD-10-CM | POA: Diagnosis not present

## 2019-01-02 DIAGNOSIS — L03012 Cellulitis of left finger: Secondary | ICD-10-CM | POA: Diagnosis not present

## 2019-01-02 DIAGNOSIS — E559 Vitamin D deficiency, unspecified: Secondary | ICD-10-CM | POA: Diagnosis not present

## 2019-01-04 DIAGNOSIS — L03012 Cellulitis of left finger: Secondary | ICD-10-CM | POA: Diagnosis not present

## 2019-01-09 DIAGNOSIS — Z6827 Body mass index (BMI) 27.0-27.9, adult: Secondary | ICD-10-CM | POA: Diagnosis not present

## 2019-01-09 DIAGNOSIS — L03012 Cellulitis of left finger: Secondary | ICD-10-CM | POA: Diagnosis not present

## 2019-01-13 DIAGNOSIS — Z6827 Body mass index (BMI) 27.0-27.9, adult: Secondary | ICD-10-CM | POA: Diagnosis not present

## 2019-01-13 DIAGNOSIS — L03012 Cellulitis of left finger: Secondary | ICD-10-CM | POA: Diagnosis not present

## 2019-01-18 DIAGNOSIS — M19049 Primary osteoarthritis, unspecified hand: Secondary | ICD-10-CM | POA: Diagnosis not present

## 2019-01-18 DIAGNOSIS — Z6827 Body mass index (BMI) 27.0-27.9, adult: Secondary | ICD-10-CM | POA: Diagnosis not present

## 2019-01-18 DIAGNOSIS — L03012 Cellulitis of left finger: Secondary | ICD-10-CM | POA: Diagnosis not present

## 2019-06-23 IMAGING — CR DG FINGER MIDDLE 2+V*L*
3 series · 3 of 3 positions shown · non-contrast
Comparison: None.

CLINICAL DATA: Lanced a boil recently in the distal third digit
with soft tissue swelling, initial encounter

EXAM:
LEFT MIDDLE FINGER 2+V

[x finger pa left]
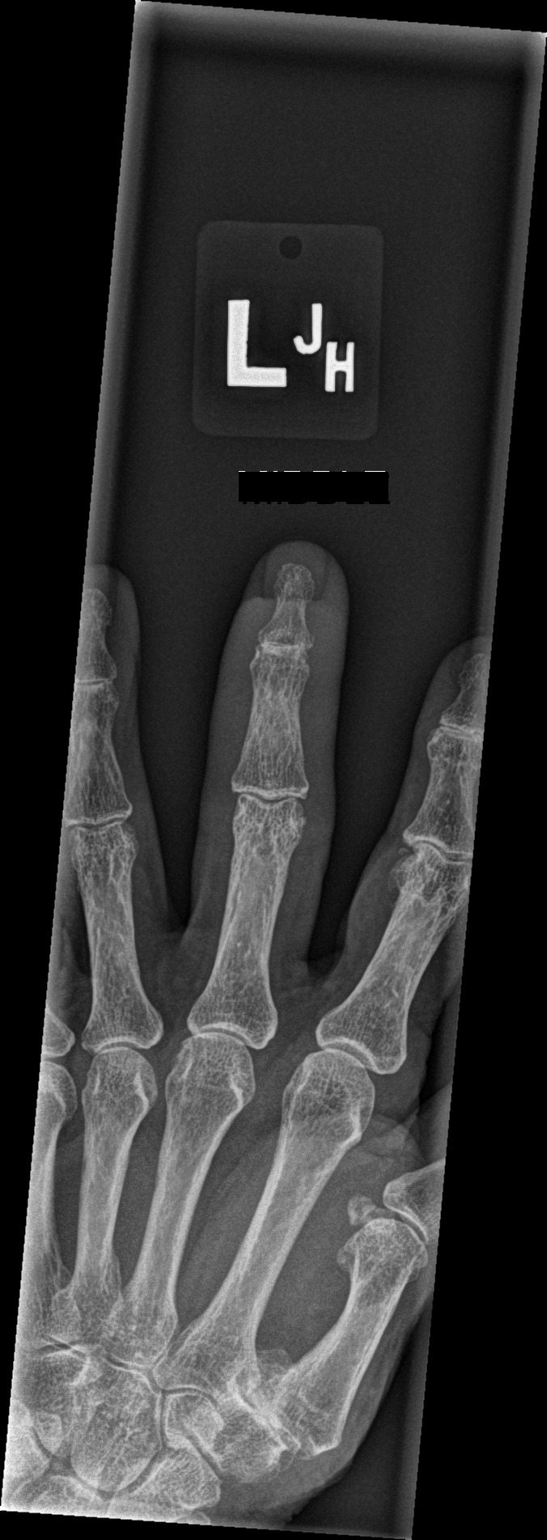

[x finger obl left]
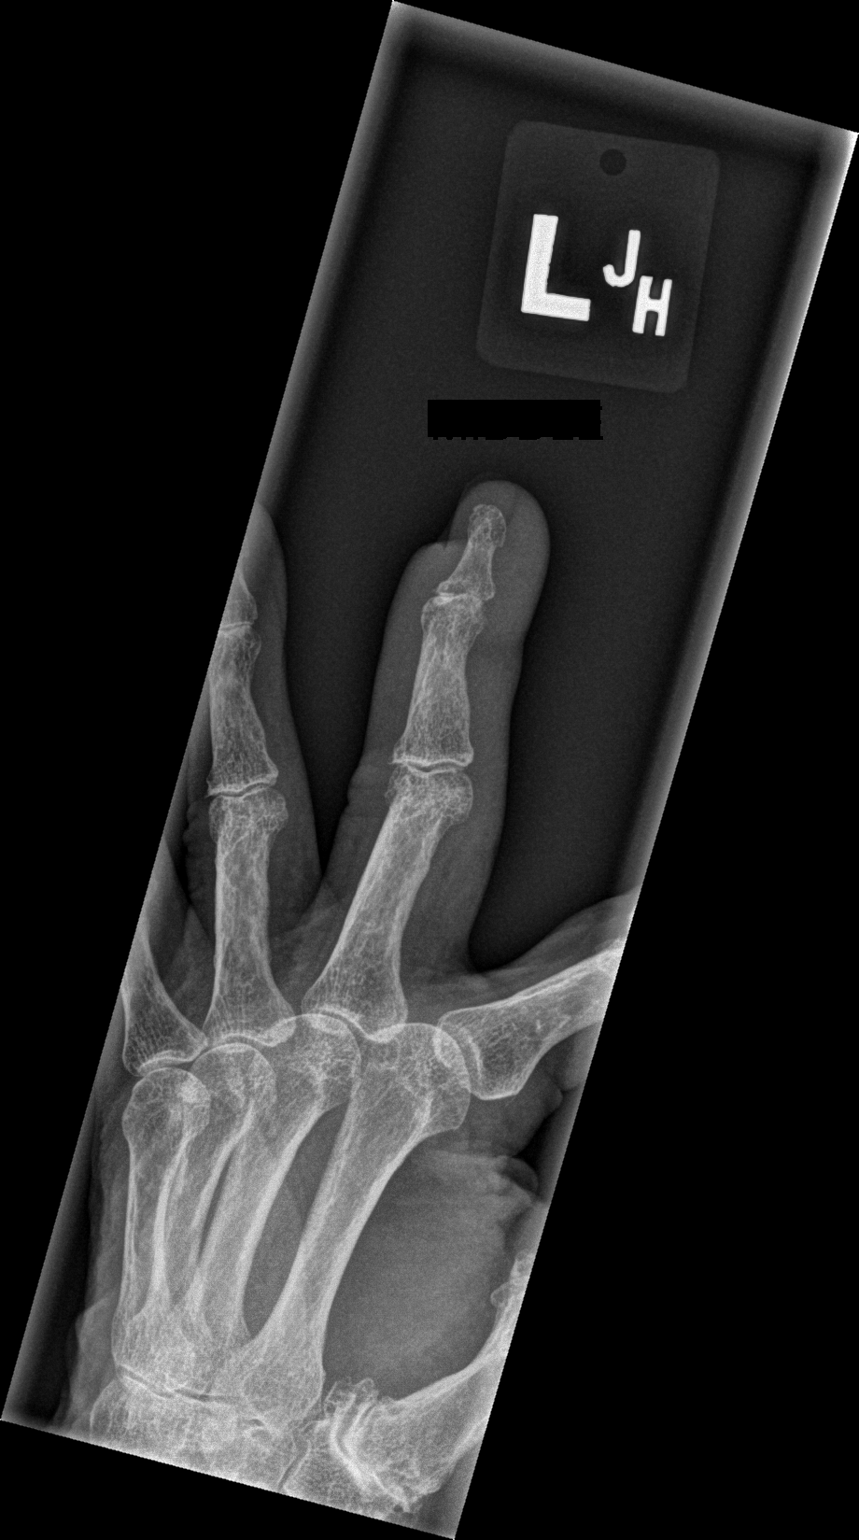

[x finger lat left]
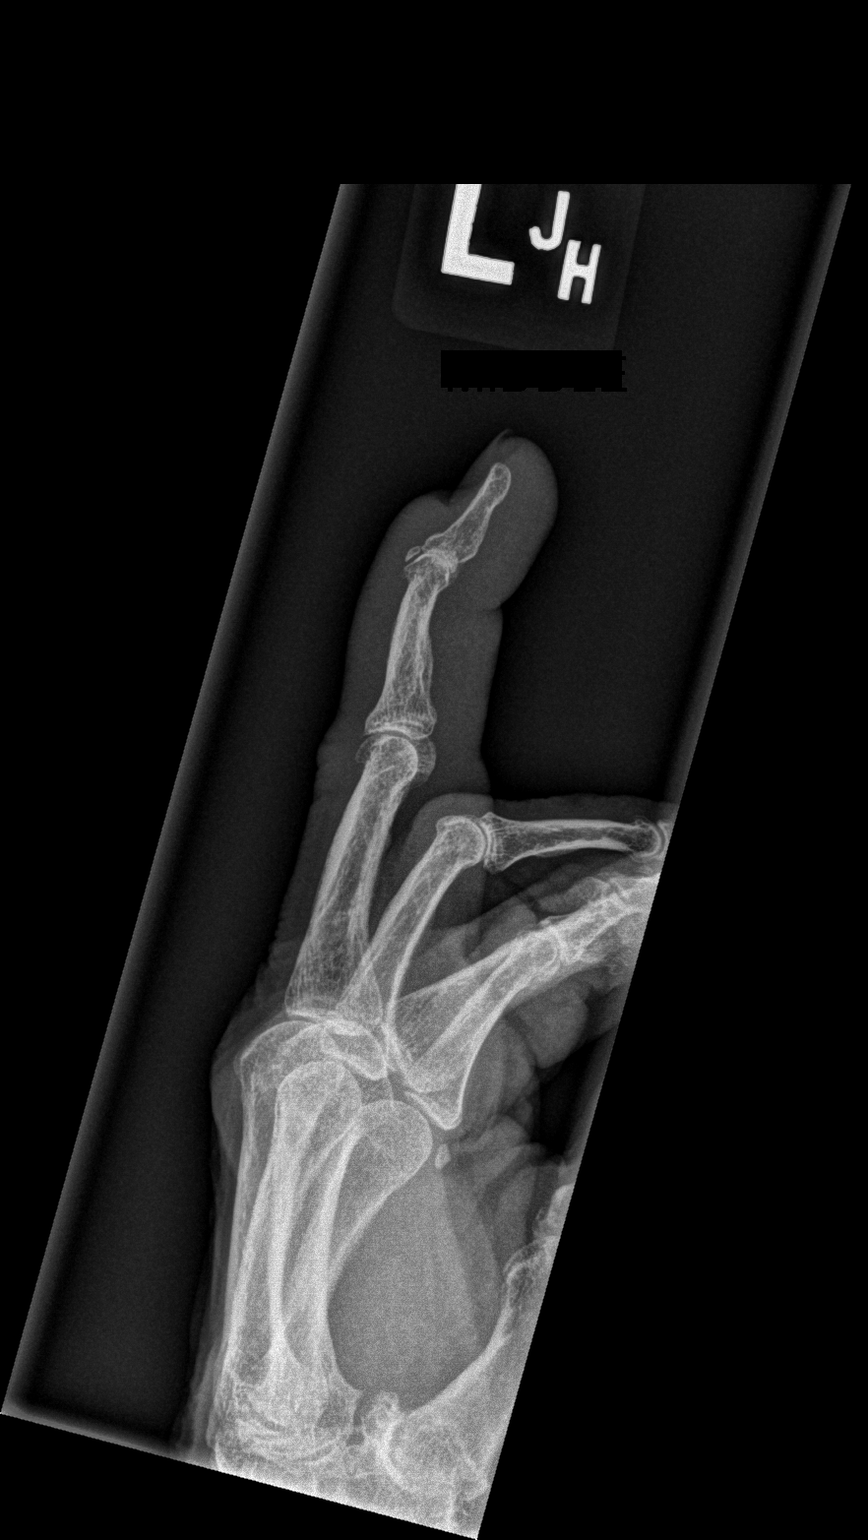

[3 of 3 positions shown; findings below may reference images not displayed]

FINDINGS: Soft tissue swelling in the third digit is noted distally along the
dorsal aspect. Degenerative changes of the interphalangeal joints
are seen. No bony erosion to suggest osteomyelitis is seen. No
fracture is noted.
IMPRESSION: Soft tissue swelling without acute bony abnormality.

## 2019-07-24 ENCOUNTER — Other Ambulatory Visit: Payer: Self-pay

## 2019-09-19 DIAGNOSIS — Z23 Encounter for immunization: Secondary | ICD-10-CM | POA: Diagnosis not present

## 2019-11-10 ENCOUNTER — Other Ambulatory Visit: Payer: Self-pay

## 2020-02-19 DIAGNOSIS — Z0000 Encounter for general adult medical examination without abnormal findings: Secondary | ICD-10-CM | POA: Diagnosis not present

## 2020-02-19 DIAGNOSIS — D500 Iron deficiency anemia secondary to blood loss (chronic): Secondary | ICD-10-CM | POA: Diagnosis not present

## 2020-02-19 DIAGNOSIS — I10 Essential (primary) hypertension: Secondary | ICD-10-CM | POA: Diagnosis not present

## 2020-02-19 DIAGNOSIS — E559 Vitamin D deficiency, unspecified: Secondary | ICD-10-CM | POA: Diagnosis not present

## 2020-02-19 DIAGNOSIS — Z6827 Body mass index (BMI) 27.0-27.9, adult: Secondary | ICD-10-CM | POA: Diagnosis not present

## 2020-02-19 DIAGNOSIS — E782 Mixed hyperlipidemia: Secondary | ICD-10-CM | POA: Diagnosis not present

## 2020-05-02 DIAGNOSIS — M79669 Pain in unspecified lower leg: Secondary | ICD-10-CM | POA: Diagnosis not present

## 2020-05-02 DIAGNOSIS — R079 Chest pain, unspecified: Secondary | ICD-10-CM | POA: Diagnosis not present

## 2020-05-02 DIAGNOSIS — R2243 Localized swelling, mass and lump, lower limb, bilateral: Secondary | ICD-10-CM | POA: Diagnosis not present

## 2020-05-03 DIAGNOSIS — R600 Localized edema: Secondary | ICD-10-CM | POA: Diagnosis not present

## 2020-05-03 DIAGNOSIS — I4510 Unspecified right bundle-branch block: Secondary | ICD-10-CM | POA: Diagnosis not present

## 2020-05-03 DIAGNOSIS — I10 Essential (primary) hypertension: Secondary | ICD-10-CM | POA: Diagnosis not present

## 2020-05-22 DIAGNOSIS — R1084 Generalized abdominal pain: Secondary | ICD-10-CM | POA: Diagnosis not present

## 2020-05-22 DIAGNOSIS — N390 Urinary tract infection, site not specified: Secondary | ICD-10-CM | POA: Diagnosis not present

## 2020-05-22 DIAGNOSIS — R509 Fever, unspecified: Secondary | ICD-10-CM | POA: Diagnosis not present

## 2020-05-22 DIAGNOSIS — R197 Diarrhea, unspecified: Secondary | ICD-10-CM | POA: Diagnosis not present

## 2020-05-22 DIAGNOSIS — R519 Headache, unspecified: Secondary | ICD-10-CM | POA: Diagnosis not present

## 2020-06-05 DIAGNOSIS — N12 Tubulo-interstitial nephritis, not specified as acute or chronic: Secondary | ICD-10-CM | POA: Diagnosis not present

## 2020-06-05 DIAGNOSIS — E782 Mixed hyperlipidemia: Secondary | ICD-10-CM | POA: Diagnosis not present

## 2020-06-05 DIAGNOSIS — I10 Essential (primary) hypertension: Secondary | ICD-10-CM | POA: Diagnosis not present

## 2020-06-05 DIAGNOSIS — N3000 Acute cystitis without hematuria: Secondary | ICD-10-CM | POA: Diagnosis not present

## 2020-06-05 DIAGNOSIS — N10 Acute tubulo-interstitial nephritis: Secondary | ICD-10-CM | POA: Diagnosis not present

## 2020-07-09 DIAGNOSIS — R5383 Other fatigue: Secondary | ICD-10-CM | POA: Diagnosis not present

## 2020-07-09 DIAGNOSIS — I10 Essential (primary) hypertension: Secondary | ICD-10-CM | POA: Diagnosis not present

## 2020-07-09 DIAGNOSIS — E782 Mixed hyperlipidemia: Secondary | ICD-10-CM | POA: Diagnosis not present

## 2020-07-09 DIAGNOSIS — E559 Vitamin D deficiency, unspecified: Secondary | ICD-10-CM | POA: Diagnosis not present

## 2020-07-10 DIAGNOSIS — I10 Essential (primary) hypertension: Secondary | ICD-10-CM | POA: Diagnosis not present

## 2020-07-10 DIAGNOSIS — E782 Mixed hyperlipidemia: Secondary | ICD-10-CM | POA: Diagnosis not present

## 2020-07-10 DIAGNOSIS — E1169 Type 2 diabetes mellitus with other specified complication: Secondary | ICD-10-CM | POA: Diagnosis not present

## 2020-07-10 DIAGNOSIS — E559 Vitamin D deficiency, unspecified: Secondary | ICD-10-CM | POA: Diagnosis not present

## 2020-09-09 DIAGNOSIS — E782 Mixed hyperlipidemia: Secondary | ICD-10-CM | POA: Diagnosis not present

## 2020-09-09 DIAGNOSIS — I10 Essential (primary) hypertension: Secondary | ICD-10-CM | POA: Diagnosis not present

## 2020-09-09 DIAGNOSIS — E559 Vitamin D deficiency, unspecified: Secondary | ICD-10-CM | POA: Diagnosis not present

## 2020-09-09 DIAGNOSIS — Z7189 Other specified counseling: Secondary | ICD-10-CM | POA: Diagnosis not present

## 2020-09-09 DIAGNOSIS — Z3161 Procreative counseling and advice using natural family planning: Secondary | ICD-10-CM | POA: Diagnosis not present

## 2020-09-24 DIAGNOSIS — Z23 Encounter for immunization: Secondary | ICD-10-CM | POA: Diagnosis not present

## 2020-12-03 DIAGNOSIS — N12 Tubulo-interstitial nephritis, not specified as acute or chronic: Secondary | ICD-10-CM | POA: Diagnosis not present

## 2020-12-03 DIAGNOSIS — N3000 Acute cystitis without hematuria: Secondary | ICD-10-CM | POA: Diagnosis not present

## 2020-12-03 DIAGNOSIS — N390 Urinary tract infection, site not specified: Secondary | ICD-10-CM | POA: Diagnosis not present

## 2020-12-05 DIAGNOSIS — N12 Tubulo-interstitial nephritis, not specified as acute or chronic: Secondary | ICD-10-CM | POA: Diagnosis not present

## 2020-12-05 DIAGNOSIS — N3000 Acute cystitis without hematuria: Secondary | ICD-10-CM | POA: Diagnosis not present

## 2020-12-10 DIAGNOSIS — N12 Tubulo-interstitial nephritis, not specified as acute or chronic: Secondary | ICD-10-CM | POA: Diagnosis not present

## 2021-01-09 DIAGNOSIS — E782 Mixed hyperlipidemia: Secondary | ICD-10-CM | POA: Diagnosis not present

## 2021-01-09 DIAGNOSIS — E559 Vitamin D deficiency, unspecified: Secondary | ICD-10-CM | POA: Diagnosis not present

## 2021-01-13 DIAGNOSIS — E782 Mixed hyperlipidemia: Secondary | ICD-10-CM | POA: Diagnosis not present

## 2021-01-13 DIAGNOSIS — M810 Age-related osteoporosis without current pathological fracture: Secondary | ICD-10-CM | POA: Diagnosis not present

## 2021-01-13 DIAGNOSIS — I10 Essential (primary) hypertension: Secondary | ICD-10-CM | POA: Diagnosis not present

## 2021-01-13 DIAGNOSIS — E559 Vitamin D deficiency, unspecified: Secondary | ICD-10-CM | POA: Diagnosis not present

## 2021-01-14 ENCOUNTER — Other Ambulatory Visit: Payer: Self-pay | Admitting: Family Medicine

## 2021-01-14 DIAGNOSIS — M8580 Other specified disorders of bone density and structure, unspecified site: Secondary | ICD-10-CM

## 2021-02-24 DIAGNOSIS — Z1331 Encounter for screening for depression: Secondary | ICD-10-CM | POA: Diagnosis not present

## 2021-02-24 DIAGNOSIS — Z0000 Encounter for general adult medical examination without abnormal findings: Secondary | ICD-10-CM | POA: Diagnosis not present

## 2021-02-24 DIAGNOSIS — Z1339 Encounter for screening examination for other mental health and behavioral disorders: Secondary | ICD-10-CM | POA: Diagnosis not present

## 2021-04-07 DIAGNOSIS — I10 Essential (primary) hypertension: Secondary | ICD-10-CM | POA: Diagnosis not present

## 2021-04-07 DIAGNOSIS — D500 Iron deficiency anemia secondary to blood loss (chronic): Secondary | ICD-10-CM | POA: Diagnosis not present

## 2021-04-07 DIAGNOSIS — E782 Mixed hyperlipidemia: Secondary | ICD-10-CM | POA: Diagnosis not present

## 2021-04-07 DIAGNOSIS — E559 Vitamin D deficiency, unspecified: Secondary | ICD-10-CM | POA: Diagnosis not present

## 2021-04-07 DIAGNOSIS — M810 Age-related osteoporosis without current pathological fracture: Secondary | ICD-10-CM | POA: Diagnosis not present

## 2021-04-09 DIAGNOSIS — E782 Mixed hyperlipidemia: Secondary | ICD-10-CM | POA: Diagnosis not present

## 2021-04-09 DIAGNOSIS — I10 Essential (primary) hypertension: Secondary | ICD-10-CM | POA: Diagnosis not present

## 2021-04-09 DIAGNOSIS — E559 Vitamin D deficiency, unspecified: Secondary | ICD-10-CM | POA: Diagnosis not present

## 2021-04-09 DIAGNOSIS — M810 Age-related osteoporosis without current pathological fracture: Secondary | ICD-10-CM | POA: Diagnosis not present

## 2021-05-20 ENCOUNTER — Ambulatory Visit
Admission: RE | Admit: 2021-05-20 | Discharge: 2021-05-20 | Disposition: A | Discharge status: Home / Self Care | Payer: Medicare Other | Source: Ambulatory Visit | Attending: Family Medicine | Admitting: Family Medicine

## 2021-05-20 ENCOUNTER — Other Ambulatory Visit: Payer: Self-pay

## 2021-05-20 DIAGNOSIS — Z780 Asymptomatic menopausal state: Secondary | ICD-10-CM | POA: Diagnosis not present

## 2021-05-20 DIAGNOSIS — M8580 Other specified disorders of bone density and structure, unspecified site: Secondary | ICD-10-CM

## 2021-05-20 DIAGNOSIS — M8589 Other specified disorders of bone density and structure, multiple sites: Secondary | ICD-10-CM | POA: Diagnosis not present

## 2021-05-23 DIAGNOSIS — M8580 Other specified disorders of bone density and structure, unspecified site: Secondary | ICD-10-CM | POA: Diagnosis not present

## 2021-08-04 DIAGNOSIS — U071 COVID-19: Secondary | ICD-10-CM | POA: Diagnosis not present

## 2021-08-07 DIAGNOSIS — U071 COVID-19: Secondary | ICD-10-CM | POA: Diagnosis not present

## 2021-10-07 DIAGNOSIS — E559 Vitamin D deficiency, unspecified: Secondary | ICD-10-CM | POA: Diagnosis not present

## 2021-10-07 DIAGNOSIS — Z7185 Encounter for immunization safety counseling: Secondary | ICD-10-CM | POA: Diagnosis not present

## 2021-10-07 DIAGNOSIS — R011 Cardiac murmur, unspecified: Secondary | ICD-10-CM | POA: Diagnosis not present

## 2021-10-07 DIAGNOSIS — I10 Essential (primary) hypertension: Secondary | ICD-10-CM | POA: Diagnosis not present

## 2021-10-07 DIAGNOSIS — Z23 Encounter for immunization: Secondary | ICD-10-CM | POA: Diagnosis not present

## 2022-04-07 DIAGNOSIS — E782 Mixed hyperlipidemia: Secondary | ICD-10-CM | POA: Diagnosis not present

## 2022-04-07 DIAGNOSIS — I10 Essential (primary) hypertension: Secondary | ICD-10-CM | POA: Diagnosis not present

## 2022-04-07 DIAGNOSIS — D649 Anemia, unspecified: Secondary | ICD-10-CM | POA: Diagnosis not present

## 2022-04-14 DIAGNOSIS — R011 Cardiac murmur, unspecified: Secondary | ICD-10-CM | POA: Diagnosis not present

## 2022-04-14 DIAGNOSIS — I10 Essential (primary) hypertension: Secondary | ICD-10-CM | POA: Diagnosis not present

## 2022-04-14 DIAGNOSIS — D649 Anemia, unspecified: Secondary | ICD-10-CM | POA: Diagnosis not present

## 2022-04-14 DIAGNOSIS — E782 Mixed hyperlipidemia: Secondary | ICD-10-CM | POA: Diagnosis not present

## 2022-06-18 ENCOUNTER — Ambulatory Visit: Payer: Medicare Other | Admitting: Cardiology

## 2022-07-30 DIAGNOSIS — I350 Nonrheumatic aortic (valve) stenosis: Secondary | ICD-10-CM | POA: Insufficient documentation

## 2022-07-30 DIAGNOSIS — R9439 Abnormal result of other cardiovascular function study: Secondary | ICD-10-CM | POA: Insufficient documentation

## 2022-07-30 NOTE — Progress Notes (Signed)
Cardiology Office Note   Date:  07/31/2022   ID:  Christine Morse, DOB 1936-01-19, MRN [id number]  PCP:  [name], MD  Cardiologist:   Rollene Rotunda, MD Referring:  [name], MD  Chief Complaint  Patient presents with   Heart Murmur      History of Present Illness: Christine Morse is a 86 y.o. female who presents for evaluation of a murmur.   I saw her in 2015.  She had mild AS on echo.  Stress testing suggested anteroseptal and apical mild ischemia with a well preserved ejection fraction.  This was low risk and no other testing was indicated.    I take care of her husband who she just put into memory unit.  He has severe dementia.  She raises her granddaughter who is a Holiday representative in high school.  She does all of her activities.  This includes pushing a lawnmower.  She denies any cardiovascular symptoms.  The patient denies any new symptoms such as chest discomfort, neck or arm discomfort. There has been no new shortness of breath, PND or orthopnea. There have been no reported palpitations, presyncope or syncope.     Past Medical History:  Diagnosis Date   Anemia    Aortic Stenosis    Cyst of ovary, right 2013   "/US" (10/04/2013)   Diverticulitis    Diverticulosis    GERD (gastroesophageal reflux disease)    History of blood transfusion    "just today; my HgB is 5.1" (10/04/2013)   History of gastric ulcer    Hyperlipidemia    Hypertension    Nephrolithiasis    "passed them on my own; went away after I quit drinking tea" (10/04/2013)   Osteoporosis    Vitamin D deficiency     Past Surgical History:  Procedure Laterality Date   ABDOMINAL HYSTERECTOMY  1981   ESOPHAGOGASTRODUODENOSCOPY N/A 10/04/2013   Procedure: ESOPHAGOGASTRODUODENOSCOPY (EGD);  Surgeon: Hart Carwin, MD;  Location: [facility] ENDOSCOPY;  Service: Endoscopy;  Laterality: N/A;   LEFT OOPHORECTOMY Left 1981   MALONEY DILATION N/A 10/04/2013   Procedure: Elease Hashimoto DILATION;  Surgeon: Hart Carwin, MD;  Location: [facility] ENDOSCOPY;  Service: Endoscopy;  Laterality: N/A;     Current Outpatient Medications  Medication Sig Dispense Refill   acetaminophen (TYLENOL) 500 MG tablet Take 1,000 mg by mouth every 6 (six) hours as needed for mild pain, moderate pain or headache.     aspirin EC 81 MG tablet Take 1 tablet (81 mg total) by mouth daily.     Calcium Carbonate-Vitamin D (CALCIUM + D PO) Take 2 tablets by mouth daily.      ferrous sulfate 325 (65 FE) MG tablet Take 1 tablet (325 mg total) by mouth daily with breakfast. 30 tablet 3   hydrochlorothiazide (HYDRODIURIL) 12.5 MG tablet Take 12.5 mg by mouth every morning.     losartan (COZAAR) 100 MG tablet Take 100 mg by mouth daily.     rosuvastatin (CRESTOR) 5 MG tablet Take 5 mg by mouth daily.     No current facility-administered medications for this visit.    Allergies:   Patient has no known allergies.    Social History:  The patient  reports that she has quit smoking. Her smoking use included cigarettes. She has never used smokeless tobacco. She reports that she does not drink alcohol and does not use drugs.   Family History:  The patient's family history includes Dementia in her  father; Heart failure (age of onset: 33) in her mother; Ovarian cancer in her sister.    ROS:  Please see the history of present illness.   Otherwise, review of systems are positive for none.   All other systems are reviewed and negative.    PHYSICAL EXAM: VS:  BP 110/64   Ht 5\' 3"  (1.6 m)   Wt 122 lb 12.8 oz (55.7 kg)   SpO2 96%   BMI 21.75 kg/m  , BMI Body mass index is 21.75 kg/m. GENERAL:  Well appearing HEENT:  Pupils equal round and reactive, fundi not visualized, oral mucosa unremarkable NECK:  No jugular venous distention, waveform within normal limits, carotid upstroke brisk and symmetric, no bruits, no thyromegaly LYMPHATICS:  No cervical, inguinal adenopathy LUNGS:  Clear to auscultation bilaterally BACK:  No CVA  tenderness CHEST:  Unremarkable HEART:  PMI not displaced or sustained,S1 and S2 within normal limits, no S3, no S4, no clicks, no rubs, 2 out of 6 apical systolic murmur early peaking and radiating at the aortic outflow tract, no diastolic murmurs ABD:  Flat, positive bowel sounds normal in frequency in pitch, no bruits, no rebound, no guarding, no midline pulsatile mass, no hepatomegaly, no splenomegaly EXT:  2 plus pulses throughout, no edema, no cyanosis no clubbing SKIN:  No rashes no nodules NEURO:  Cranial nerves II through XII grossly intact, motor grossly intact throughout PSYCH:  Cognitively intact, oriented to person place and time    EKG:  EKG is ordered today. The ekg ordered today demonstrates sinus rhythm, rate 68, axis within normal limits, intervals within normal limits, no acute ST-T wave changes.   Recent Labs: No results found for requested labs within last 365 days.    Lipid Panel No results found for: "CHOL", "TRIG", "HDL", "CHOLHDL", "VLDL", "LDLCALC", "LDLDIRECT"    Wt Readings from Last 3 Encounters:  07/31/22 122 lb 12.8 oz (55.7 kg)  09/19/17 135 lb 9.6 oz (61.5 kg)  02/23/14 135 lb (61.2 kg)      Other studies Reviewed: Additional studies/ records that were reviewed today include: Primary Care records. Review of the above records demonstrates:  Please see elsewhere in the note.     ASSESSMENT AND PLAN:  AS: I suspect the aortic stenosis is still mild by exam but will check an echocardiogram.  She has no symptoms.  I do not think he will be need for any other therapy.  HTN: Blood pressure is controlled.  No change in therapy.  Mom   Current medicines are reviewed at length with the patient today.  The patient does not have concerns regarding medicines.  The following changes have been made:  no change  Labs/ tests ordered today include:   Orders Placed This Encounter  Procedures   EKG 12-Lead   ECHOCARDIOGRAM COMPLETE     Disposition:    FU with me based on the results of the echo.     Signed, 04/25/14, MD  07/31/2022 11:04 AM    [facility]

## 2022-07-31 ENCOUNTER — Ambulatory Visit (INDEPENDENT_AMBULATORY_CARE_PROVIDER_SITE_OTHER): Payer: Medicare Other | Admitting: Cardiology

## 2022-07-31 ENCOUNTER — Encounter: Payer: Self-pay | Admitting: Cardiology

## 2022-07-31 VITALS — BP 110/64 | Ht 63.0 in | Wt 122.8 lb

## 2022-07-31 DIAGNOSIS — I350 Nonrheumatic aortic (valve) stenosis: Secondary | ICD-10-CM | POA: Diagnosis not present

## 2022-07-31 DIAGNOSIS — R9439 Abnormal result of other cardiovascular function study: Secondary | ICD-10-CM | POA: Diagnosis not present

## 2022-07-31 NOTE — Patient Instructions (Signed)
Medication Instructions:  Your Physician recommend you continue on your current medication as directed.    *If you need a refill on your cardiac medications before your next appointment, please call your pharmacy*   Lab Work: None ordered today   Testing/Procedures: Your physician has requested that you have an echocardiogram. Echocardiography is a painless test that uses sound waves to create images of your heart. It provides your doctor with information about the size and shape of your heart and how well your heart's chambers and valves are working. This procedure takes approximately one hour. There are no restrictions for this procedure. [address]    Follow-Up: At [facility], you and your health needs are our priority.  As part of our continuing mission to provide you with exceptional heart care, we have created designated Provider Care Teams.  These Care Teams include your primary Cardiologist (physician) and Advanced Practice Providers (APPs -  Physician Assistants and Nurse Practitioners) who all work together to provide you with the care you need, when you need it.  We recommend signing up for the patient portal called "MyChart".  Sign up information is provided on this After Visit Summary.  MyChart is used to connect with patients for Virtual Visits (Telemedicine).  Patients are able to view lab/test results, encounter notes, upcoming appointments, etc.  Non-urgent messages can be sent to your provider as well.   To learn more about what you can do with MyChart, go to [email or web address].    Your next appointment:   As needed  The format for your next appointment:   In Person  Provider:   Rollene Rotunda, MD {

## 2022-08-07 ENCOUNTER — Other Ambulatory Visit: Payer: Self-pay

## 2022-08-11 ENCOUNTER — Other Ambulatory Visit: Payer: Medicare Other

## 2022-08-12 ENCOUNTER — Other Ambulatory Visit: Payer: Medicare Other

## 2022-08-13 ENCOUNTER — Ambulatory Visit (INDEPENDENT_AMBULATORY_CARE_PROVIDER_SITE_OTHER): Payer: Medicare Other

## 2022-08-13 DIAGNOSIS — I350 Nonrheumatic aortic (valve) stenosis: Secondary | ICD-10-CM | POA: Diagnosis not present

## 2022-08-13 LAB — ECHOCARDIOGRAM COMPLETE
AV Mean grad: 21 mmHg
AV Peak grad: 38.8 mmHg
Ao pk vel: 3.12 m/s
Area-P 1/2: 2.59 cm2
MV M vel: 7.96 m/s
MV Peak grad: 253.4 mmHg
Radius: 0.7 cm
S' Lateral: 2.5 cm

## 2022-09-08 ENCOUNTER — Encounter: Payer: Self-pay | Admitting: Gastroenterology

## 2022-09-22 DIAGNOSIS — E559 Vitamin D deficiency, unspecified: Secondary | ICD-10-CM | POA: Diagnosis not present

## 2022-09-22 DIAGNOSIS — D649 Anemia, unspecified: Secondary | ICD-10-CM | POA: Diagnosis not present

## 2022-09-22 DIAGNOSIS — E782 Mixed hyperlipidemia: Secondary | ICD-10-CM | POA: Diagnosis not present

## 2022-09-22 DIAGNOSIS — I10 Essential (primary) hypertension: Secondary | ICD-10-CM | POA: Diagnosis not present

## 2022-09-29 DIAGNOSIS — D649 Anemia, unspecified: Secondary | ICD-10-CM | POA: Diagnosis not present

## 2022-09-29 DIAGNOSIS — E559 Vitamin D deficiency, unspecified: Secondary | ICD-10-CM | POA: Diagnosis not present

## 2022-09-29 DIAGNOSIS — Z7185 Encounter for immunization safety counseling: Secondary | ICD-10-CM | POA: Diagnosis not present

## 2022-09-29 DIAGNOSIS — J309 Allergic rhinitis, unspecified: Secondary | ICD-10-CM | POA: Diagnosis not present

## 2022-09-29 DIAGNOSIS — E782 Mixed hyperlipidemia: Secondary | ICD-10-CM | POA: Diagnosis not present

## 2022-09-29 DIAGNOSIS — I10 Essential (primary) hypertension: Secondary | ICD-10-CM | POA: Diagnosis not present

## 2022-09-30 ENCOUNTER — Other Ambulatory Visit: Payer: Self-pay | Admitting: Family Medicine

## 2022-09-30 DIAGNOSIS — M810 Age-related osteoporosis without current pathological fracture: Secondary | ICD-10-CM

## 2022-10-01 DIAGNOSIS — Z23 Encounter for immunization: Secondary | ICD-10-CM | POA: Diagnosis not present

## 2022-10-08 DIAGNOSIS — Z1339 Encounter for screening examination for other mental health and behavioral disorders: Secondary | ICD-10-CM | POA: Diagnosis not present

## 2022-10-08 DIAGNOSIS — Z0000 Encounter for general adult medical examination without abnormal findings: Secondary | ICD-10-CM | POA: Diagnosis not present

## 2022-10-08 DIAGNOSIS — Z1331 Encounter for screening for depression: Secondary | ICD-10-CM | POA: Diagnosis not present

## 2022-11-17 DIAGNOSIS — H2513 Age-related nuclear cataract, bilateral: Secondary | ICD-10-CM | POA: Diagnosis not present

## 2023-03-23 DIAGNOSIS — I10 Essential (primary) hypertension: Secondary | ICD-10-CM | POA: Diagnosis not present

## 2023-03-23 DIAGNOSIS — E559 Vitamin D deficiency, unspecified: Secondary | ICD-10-CM | POA: Diagnosis not present

## 2023-03-23 DIAGNOSIS — E039 Hypothyroidism, unspecified: Secondary | ICD-10-CM | POA: Diagnosis not present

## 2023-03-30 DIAGNOSIS — I350 Nonrheumatic aortic (valve) stenosis: Secondary | ICD-10-CM | POA: Diagnosis not present

## 2023-03-30 DIAGNOSIS — J309 Allergic rhinitis, unspecified: Secondary | ICD-10-CM | POA: Diagnosis not present

## 2023-03-30 DIAGNOSIS — Z23 Encounter for immunization: Secondary | ICD-10-CM | POA: Diagnosis not present

## 2023-03-30 DIAGNOSIS — E559 Vitamin D deficiency, unspecified: Secondary | ICD-10-CM | POA: Diagnosis not present

## 2023-03-30 DIAGNOSIS — R011 Cardiac murmur, unspecified: Secondary | ICD-10-CM | POA: Diagnosis not present

## 2023-03-30 DIAGNOSIS — I10 Essential (primary) hypertension: Secondary | ICD-10-CM | POA: Diagnosis not present

## 2023-05-25 ENCOUNTER — Ambulatory Visit
Admission: RE | Admit: 2023-05-25 | Discharge: 2023-05-25 | Disposition: A | Discharge status: Home / Self Care | Payer: Medicare Other | Source: Ambulatory Visit | Attending: Family Medicine | Admitting: Family Medicine

## 2023-05-25 DIAGNOSIS — M810 Age-related osteoporosis without current pathological fracture: Secondary | ICD-10-CM

## 2023-05-25 DIAGNOSIS — R29890 Loss of height: Secondary | ICD-10-CM | POA: Diagnosis not present

## 2023-05-25 DIAGNOSIS — N951 Menopausal and female climacteric states: Secondary | ICD-10-CM | POA: Diagnosis not present

## 2023-05-25 DIAGNOSIS — Z90721 Acquired absence of ovaries, unilateral: Secondary | ICD-10-CM | POA: Diagnosis not present

## 2023-05-25 DIAGNOSIS — Z90710 Acquired absence of both cervix and uterus: Secondary | ICD-10-CM | POA: Diagnosis not present

## 2023-05-25 DIAGNOSIS — E559 Vitamin D deficiency, unspecified: Secondary | ICD-10-CM | POA: Diagnosis not present

## 2023-05-25 DIAGNOSIS — E349 Endocrine disorder, unspecified: Secondary | ICD-10-CM | POA: Diagnosis not present

## 2023-06-08 DIAGNOSIS — E559 Vitamin D deficiency, unspecified: Secondary | ICD-10-CM | POA: Diagnosis not present

## 2023-06-08 DIAGNOSIS — M8580 Other specified disorders of bone density and structure, unspecified site: Secondary | ICD-10-CM | POA: Diagnosis not present

## 2023-06-08 DIAGNOSIS — I10 Essential (primary) hypertension: Secondary | ICD-10-CM | POA: Diagnosis not present

## 2023-09-23 DIAGNOSIS — R5383 Other fatigue: Secondary | ICD-10-CM | POA: Diagnosis not present

## 2023-09-23 DIAGNOSIS — D649 Anemia, unspecified: Secondary | ICD-10-CM | POA: Diagnosis not present

## 2023-09-23 DIAGNOSIS — E559 Vitamin D deficiency, unspecified: Secondary | ICD-10-CM | POA: Diagnosis not present

## 2023-09-23 DIAGNOSIS — I10 Essential (primary) hypertension: Secondary | ICD-10-CM | POA: Diagnosis not present

## 2023-09-23 DIAGNOSIS — E785 Hyperlipidemia, unspecified: Secondary | ICD-10-CM | POA: Diagnosis not present

## 2023-09-30 DIAGNOSIS — I350 Nonrheumatic aortic (valve) stenosis: Secondary | ICD-10-CM | POA: Diagnosis not present

## 2023-09-30 DIAGNOSIS — Z23 Encounter for immunization: Secondary | ICD-10-CM | POA: Diagnosis not present

## 2023-09-30 DIAGNOSIS — E559 Vitamin D deficiency, unspecified: Secondary | ICD-10-CM | POA: Diagnosis not present

## 2023-09-30 DIAGNOSIS — E782 Mixed hyperlipidemia: Secondary | ICD-10-CM | POA: Diagnosis not present

## 2023-09-30 DIAGNOSIS — I10 Essential (primary) hypertension: Secondary | ICD-10-CM | POA: Diagnosis not present

## 2023-09-30 DIAGNOSIS — R011 Cardiac murmur, unspecified: Secondary | ICD-10-CM | POA: Diagnosis not present

## 2023-09-30 DIAGNOSIS — Z789 Other specified health status: Secondary | ICD-10-CM | POA: Diagnosis not present

## 2023-09-30 DIAGNOSIS — Z7185 Encounter for immunization safety counseling: Secondary | ICD-10-CM | POA: Diagnosis not present

## 2023-10-23 ENCOUNTER — Emergency Department (HOSPITAL_COMMUNITY): Payer: Medicare Other

## 2023-10-23 ENCOUNTER — Other Ambulatory Visit: Payer: Self-pay

## 2023-10-23 ENCOUNTER — Encounter (HOSPITAL_COMMUNITY): Payer: Self-pay

## 2023-10-23 ENCOUNTER — Inpatient Hospital Stay (HOSPITAL_COMMUNITY)
Admission: EM | Admit: 2023-10-23 | Discharge: 2023-10-27 | DRG: 374 | Disposition: A | Discharge status: Home / Self Care | Payer: Medicare Other | Attending: Family Medicine | Admitting: Family Medicine

## 2023-10-23 DIAGNOSIS — Z01810 Encounter for preprocedural cardiovascular examination: Secondary | ICD-10-CM | POA: Diagnosis not present

## 2023-10-23 DIAGNOSIS — Z90721 Acquired absence of ovaries, unilateral: Secondary | ICD-10-CM

## 2023-10-23 DIAGNOSIS — K3189 Other diseases of stomach and duodenum: Secondary | ICD-10-CM | POA: Diagnosis not present

## 2023-10-23 DIAGNOSIS — C170 Malignant neoplasm of duodenum: Principal | ICD-10-CM | POA: Diagnosis present

## 2023-10-23 DIAGNOSIS — K4090 Unilateral inguinal hernia, without obstruction or gangrene, not specified as recurrent: Secondary | ICD-10-CM | POA: Diagnosis present

## 2023-10-23 DIAGNOSIS — I959 Hypotension, unspecified: Secondary | ICD-10-CM | POA: Diagnosis present

## 2023-10-23 DIAGNOSIS — E785 Hyperlipidemia, unspecified: Secondary | ICD-10-CM | POA: Diagnosis present

## 2023-10-23 DIAGNOSIS — C772 Secondary and unspecified malignant neoplasm of intra-abdominal lymph nodes: Secondary | ICD-10-CM | POA: Diagnosis not present

## 2023-10-23 DIAGNOSIS — D6489 Other specified anemias: Secondary | ICD-10-CM | POA: Diagnosis present

## 2023-10-23 DIAGNOSIS — K449 Diaphragmatic hernia without obstruction or gangrene: Secondary | ICD-10-CM | POA: Diagnosis not present

## 2023-10-23 DIAGNOSIS — Z90710 Acquired absence of both cervix and uterus: Secondary | ICD-10-CM

## 2023-10-23 DIAGNOSIS — R000 Tachycardia, unspecified: Secondary | ICD-10-CM | POA: Diagnosis not present

## 2023-10-23 DIAGNOSIS — E871 Hypo-osmolality and hyponatremia: Secondary | ICD-10-CM | POA: Diagnosis present

## 2023-10-23 DIAGNOSIS — D62 Acute posthemorrhagic anemia: Secondary | ICD-10-CM | POA: Diagnosis not present

## 2023-10-23 DIAGNOSIS — I110 Hypertensive heart disease with heart failure: Secondary | ICD-10-CM | POA: Diagnosis present

## 2023-10-23 DIAGNOSIS — R590 Localized enlarged lymph nodes: Secondary | ICD-10-CM | POA: Diagnosis not present

## 2023-10-23 DIAGNOSIS — E559 Vitamin D deficiency, unspecified: Secondary | ICD-10-CM | POA: Diagnosis present

## 2023-10-23 DIAGNOSIS — I352 Nonrheumatic aortic (valve) stenosis with insufficiency: Secondary | ICD-10-CM | POA: Diagnosis present

## 2023-10-23 DIAGNOSIS — I5032 Chronic diastolic (congestive) heart failure: Secondary | ICD-10-CM | POA: Diagnosis present

## 2023-10-23 DIAGNOSIS — I21A1 Myocardial infarction type 2: Secondary | ICD-10-CM | POA: Diagnosis present

## 2023-10-23 DIAGNOSIS — M7989 Other specified soft tissue disorders: Secondary | ICD-10-CM | POA: Diagnosis present

## 2023-10-23 DIAGNOSIS — I421 Obstructive hypertrophic cardiomyopathy: Secondary | ICD-10-CM | POA: Diagnosis present

## 2023-10-23 DIAGNOSIS — D500 Iron deficiency anemia secondary to blood loss (chronic): Secondary | ICD-10-CM | POA: Diagnosis present

## 2023-10-23 DIAGNOSIS — Z7982 Long term (current) use of aspirin: Secondary | ICD-10-CM

## 2023-10-23 DIAGNOSIS — Z87891 Personal history of nicotine dependence: Secondary | ICD-10-CM

## 2023-10-23 DIAGNOSIS — K921 Melena: Secondary | ICD-10-CM | POA: Diagnosis present

## 2023-10-23 DIAGNOSIS — Z8041 Family history of malignant neoplasm of ovary: Secondary | ICD-10-CM

## 2023-10-23 DIAGNOSIS — D132 Benign neoplasm of duodenum: Secondary | ICD-10-CM | POA: Diagnosis not present

## 2023-10-23 DIAGNOSIS — K269 Duodenal ulcer, unspecified as acute or chronic, without hemorrhage or perforation: Secondary | ICD-10-CM | POA: Diagnosis not present

## 2023-10-23 DIAGNOSIS — M810 Age-related osteoporosis without current pathological fracture: Secondary | ICD-10-CM | POA: Diagnosis present

## 2023-10-23 DIAGNOSIS — Z8711 Personal history of peptic ulcer disease: Secondary | ICD-10-CM

## 2023-10-23 DIAGNOSIS — K922 Gastrointestinal hemorrhage, unspecified: Secondary | ICD-10-CM | POA: Diagnosis not present

## 2023-10-23 DIAGNOSIS — D649 Anemia, unspecified: Secondary | ICD-10-CM | POA: Diagnosis not present

## 2023-10-23 DIAGNOSIS — I700 Atherosclerosis of aorta: Secondary | ICD-10-CM | POA: Diagnosis not present

## 2023-10-23 DIAGNOSIS — I2510 Atherosclerotic heart disease of native coronary artery without angina pectoris: Secondary | ICD-10-CM | POA: Diagnosis present

## 2023-10-23 DIAGNOSIS — I359 Nonrheumatic aortic valve disorder, unspecified: Secondary | ICD-10-CM | POA: Diagnosis not present

## 2023-10-23 DIAGNOSIS — E876 Hypokalemia: Secondary | ICD-10-CM | POA: Diagnosis present

## 2023-10-23 DIAGNOSIS — E041 Nontoxic single thyroid nodule: Secondary | ICD-10-CM | POA: Diagnosis not present

## 2023-10-23 DIAGNOSIS — R918 Other nonspecific abnormal finding of lung field: Secondary | ICD-10-CM | POA: Diagnosis not present

## 2023-10-23 DIAGNOSIS — R195 Other fecal abnormalities: Secondary | ICD-10-CM

## 2023-10-23 DIAGNOSIS — D509 Iron deficiency anemia, unspecified: Secondary | ICD-10-CM | POA: Diagnosis not present

## 2023-10-23 DIAGNOSIS — Z8249 Family history of ischemic heart disease and other diseases of the circulatory system: Secondary | ICD-10-CM | POA: Diagnosis not present

## 2023-10-23 DIAGNOSIS — K219 Gastro-esophageal reflux disease without esophagitis: Secondary | ICD-10-CM | POA: Diagnosis present

## 2023-10-23 DIAGNOSIS — R188 Other ascites: Secondary | ICD-10-CM | POA: Diagnosis not present

## 2023-10-23 DIAGNOSIS — Z885 Allergy status to narcotic agent status: Secondary | ICD-10-CM

## 2023-10-23 DIAGNOSIS — I2489 Other forms of acute ischemic heart disease: Secondary | ICD-10-CM | POA: Diagnosis present

## 2023-10-23 DIAGNOSIS — R0602 Shortness of breath: Secondary | ICD-10-CM | POA: Diagnosis not present

## 2023-10-23 DIAGNOSIS — K5730 Diverticulosis of large intestine without perforation or abscess without bleeding: Secondary | ICD-10-CM | POA: Diagnosis not present

## 2023-10-23 DIAGNOSIS — K319 Disease of stomach and duodenum, unspecified: Secondary | ICD-10-CM | POA: Diagnosis not present

## 2023-10-23 DIAGNOSIS — I10 Essential (primary) hypertension: Secondary | ICD-10-CM | POA: Diagnosis not present

## 2023-10-23 DIAGNOSIS — Z79899 Other long term (current) drug therapy: Secondary | ICD-10-CM

## 2023-10-23 LAB — CBC WITH DIFFERENTIAL/PLATELET
Abs Immature Granulocytes: 0.06 10*3/uL (ref 0.00–0.07)
Basophils Absolute: 0.1 10*3/uL (ref 0.0–0.1)
Basophils Relative: 1 %
Eosinophils Absolute: 0.1 10*3/uL (ref 0.0–0.5)
Eosinophils Relative: 1 %
HCT: 28.2 % — ABNORMAL LOW (ref 36.0–46.0)
Hemoglobin: 9.3 g/dL — ABNORMAL LOW (ref 12.0–15.0)
Immature Granulocytes: 1 %
Lymphocytes Relative: 9 %
Lymphs Abs: 0.9 10*3/uL (ref 0.7–4.0)
MCH: 32.5 pg (ref 26.0–34.0)
MCHC: 33 g/dL (ref 30.0–36.0)
MCV: 98.6 fL (ref 80.0–100.0)
Monocytes Absolute: 0.5 10*3/uL (ref 0.1–1.0)
Monocytes Relative: 4 %
Neutro Abs: 9.1 10*3/uL — ABNORMAL HIGH (ref 1.7–7.7)
Neutrophils Relative %: 84 %
Platelets: 409 10*3/uL — ABNORMAL HIGH (ref 150–400)
RBC: 2.86 MIL/uL — ABNORMAL LOW (ref 3.87–5.11)
RDW: 13.7 % (ref 11.5–15.5)
WBC: 10.7 10*3/uL — ABNORMAL HIGH (ref 4.0–10.5)
nRBC: 0 % (ref 0.0–0.2)

## 2023-10-23 LAB — COMPREHENSIVE METABOLIC PANEL
ALT: 13 U/L (ref 0–44)
AST: 20 U/L (ref 15–41)
Albumin: 3.8 g/dL (ref 3.5–5.0)
Alkaline Phosphatase: 57 U/L (ref 38–126)
Anion gap: 8 (ref 5–15)
BUN: 23 mg/dL (ref 8–23)
CO2: 25 mmol/L (ref 22–32)
Calcium: 9 mg/dL (ref 8.9–10.3)
Chloride: 99 mmol/L (ref 98–111)
Creatinine, Ser: 0.46 mg/dL (ref 0.44–1.00)
GFR, Estimated: >60 mL/min (ref >60)
Glucose, Bld: 108 mg/dL — ABNORMAL HIGH (ref 70–99)
Potassium: 3.5 mmol/L (ref 3.5–5.1)
Sodium: 132 mmol/L — ABNORMAL LOW (ref 135–145)
Total Bilirubin: 0.8 mg/dL (ref 0.3–1.2)
Total Protein: 6.3 g/dL — ABNORMAL LOW (ref 6.5–8.1)

## 2023-10-23 LAB — BRAIN NATRIURETIC PEPTIDE: B Natriuretic Peptide: 177.4 pg/mL — ABNORMAL HIGH (ref 0.0–100.0)

## 2023-10-23 LAB — TROPONIN I (HIGH SENSITIVITY): Troponin I (High Sensitivity): 17 ng/L (ref <18)

## 2023-10-23 MED ORDER — ALBUTEROL SULFATE HFA 108 (90 BASE) MCG/ACT IN AERS: 2.0000 | INHALATION_SPRAY | RESPIRATORY_TRACT | Status: DC | PRN

## 2023-10-23 MED ORDER — SODIUM CHLORIDE 0.9 % IV BOLUS
1000.0000 mL | Freq: Once | INTRAVENOUS | Status: AC
  Administered 2023-10-23: 1000 mL via INTRAVENOUS

## 2023-10-23 NOTE — ED Provider Notes (Signed)
[place] EMERGENCY DEPARTMENT AT [facility] Provider Note   CSN: [id number] Arrival date & time: 10/23/23  2003     History {Add pertinent medical, surgical, social history, OB history to HPI:1} Chief Complaint  Patient presents with   Shortness of Breath    Christine Morse is a 87 y.o. female.  Patient with past medical history significant for anemia due to chronic blood loss, diverticulosis, GERD, chest tightness, hypertension, pulmonary vascular congestion presents to the emergency department complaining of 2 days of shortness of breath with exertion with associated chest tightness and feelings of indigestion.  She states that if she walks 50 feet she becomes short of breath and the chest tightness begins.  It resolves with rest.  The indigestion feeling is brought on by any food intake.  She states that chewing gum resolves her indigestion feelings.  She denies abdominal pain, nausea, vomiting, chest pain or pressure at rest, shortness of breath at rest.   Shortness of Breath      Home Medications Prior to Admission medications   Medication Sig Start Date End Date Taking? Authorizing Provider  acetaminophen (TYLENOL) 500 MG tablet Take 1,000 mg by mouth every 6 (six) hours as needed for mild pain, moderate pain or headache.    [provider]  aspirin EC 81 MG tablet Take 1 tablet (81 mg total) by mouth daily. 08/23/13   Andrena Mews, DO  Calcium Carbonate-Vitamin D (CALCIUM + D PO) Take 2 tablets by mouth daily.     [provider]  ferrous sulfate 325 (65 FE) MG tablet Take 1 tablet (325 mg total) by mouth daily with breakfast. 10/05/13   Christiane Ha, MD  hydrochlorothiazide (HYDRODIURIL) 12.5 MG tablet Take 12.5 mg by mouth every morning. 05/10/22   [provider]  losartan (COZAAR) 100 MG tablet Take 100 mg by mouth daily. 06/22/22   [provider]  rosuvastatin (CRESTOR) 5 MG tablet Take 5 mg by mouth daily. 07/10/22    [provider]      Allergies    Codeine    Review of Systems   Review of Systems  Respiratory:  Positive for shortness of breath.     Physical Exam Updated Vital Signs BP (!) 96/55   Pulse 85   Temp 98.7 F (37.1 C) (Oral)   Resp (!) 22   SpO2 96%  Physical Exam Vitals and nursing note reviewed.  Constitutional:      General: She is not in acute distress.    Appearance: She is well-developed.  HENT:     Head: Normocephalic and atraumatic.  Eyes:     Conjunctiva/sclera: Conjunctivae normal.  Cardiovascular:     Rate and Rhythm: Normal rate and regular rhythm.  Pulmonary:     Effort: Pulmonary effort is normal. No respiratory distress.     Breath sounds: Normal breath sounds. No decreased breath sounds, wheezing, rhonchi or rales.  Chest:     Chest wall: No tenderness.  Abdominal:     Palpations: Abdomen is soft.     Tenderness: There is no abdominal tenderness.  Musculoskeletal:        General: No swelling.     Cervical back: Neck supple.     Right lower leg: No edema.     Left lower leg: No edema.  Skin:    General: Skin is warm and dry.     Capillary Refill: Capillary refill takes less than 2 seconds.  Neurological:  Mental Status: She is alert.  Psychiatric:        Mood and Affect: Mood normal.     ED Results / Procedures / Treatments   Labs (all labs ordered are listed, but only abnormal results are displayed) Labs Reviewed  CBC WITH DIFFERENTIAL/PLATELET - Abnormal; Notable for the following components:      Result Value   WBC 10.7 (*)    RBC 2.86 (*)    Hemoglobin 9.3 (*)    HCT 28.2 (*)    Platelets 409 (*)    Neutro Abs 9.1 (*)    All other components within normal limits  COMPREHENSIVE METABOLIC PANEL - Abnormal; Notable for the following components:   Sodium 132 (*)    Glucose, Bld 108 (*)    Total Protein 6.3 (*)    All other components within normal limits  BRAIN NATRIURETIC PEPTIDE  TROPONIN I (HIGH SENSITIVITY)     EKG EKG Interpretation Date/Time:  Saturday October 23 2023 20:31:45 EDT Ventricular Rate:  97 PR Interval:  159 QRS Duration:  105 QT Interval:  386 QTC Calculation: 491 R Axis:   19  Text Interpretation: Sinus rhythm Probable left atrial enlargement RSR' in V1 or V2, right VCD or RVH Borderline prolonged QT interval Confirmed by Bethann Berkshire [phone]) on 10/23/2023 9:07:50 PM  Radiology No results found.  Procedures Procedures  {Document cardiac monitor, telemetry assessment procedure when appropriate:1}  Medications Ordered in ED Medications  albuterol (VENTOLIN HFA) 108 (90 Base) MCG/ACT inhaler 2 puff (has no administration in time range)  sodium chloride 0.9 % bolus 1,000 mL (has no administration in time range)    ED Course/ Medical Decision Making/ A&P   {   Click here for ABCD2, HEART and other calculatorsREFRESH Note before signing :1}                              Medical Decision Making Risk Prescription drug management.   This patient presents to the ED for concern of shortness of breath with chest pressure, this involves an extensive number of treatment options, and is a complaint that carries with it a high risk of complications and morbidity.  The differential diagnosis includes ACS, PE, pneumonia, GERD, heart failure, dysrhythmia, others   Co morbidities that complicate the patient evaluation  History of chest tightness, GERD, hypertension   Additional history obtained:  Additional history obtained from family at bedside  Lab Tests:  I Ordered, and personally interpreted labs.  The pertinent results include: Initial troponin 17, hemoglobin 9.3, BNP 177.4   Imaging Studies ordered:  I ordered imaging studies including chest x-ray I independently visualized and interpreted imaging which showed *** I agree with the radiologist interpretation   Cardiac Monitoring: / EKG:  The patient was maintained on a cardiac monitor.  I personally  viewed and interpreted the cardiac monitored which showed an underlying rhythm of: Sinus rhythm   Consultations Obtained:  I requested consultation with the ***,  and discussed lab and imaging findings as well as pertinent plan - they recommend: ***   Problem List / ED Course / Critical interventions / Medication management   I ordered medication including fluid bolus for hypotension Reevaluation of the patient after these medicines showed that the patient {resolved/improved/worsened:23923::"improved"} I have reviewed the patients home medicines and have made adjustments as needed   Social Determinants of Health:  Social Determinants of Health with Concerns   Tobacco Use: Medium  Risk (10/23/2023)   Patient History    Smoking Tobacco Use: Former    Smokeless Tobacco Use: Never    Passive Exposure: Not on Actuary Strain: Not on file  Food Insecurity: Not on file  Transportation Needs: Not on file  Physical Activity: Not on file  Stress: Not on file  Social Connections: Not on file  Intimate Partner Violence: Not on file  Depression (PHQ2-9): Not on file  Alcohol Screen: Not on file  Housing: Not on file  Utilities: Not on file  Health Literacy: Not on file      Test / Admission - Considered:  ***   {Document critical care time when appropriate:1} {Document review of labs and clinical decision tools ie heart score, Chads2Vasc2 etc:1}  {Document your independent review of radiology images, and any outside records:1} {Document your discussion with family members, caretakers, and with consultants:1} {Document social determinants of health affecting pt's care:1} {Document your decision making why or why not admission, treatments were needed:1} Final Clinical Impression(s) / ED Diagnoses Final diagnoses:  None    Rx / DC Orders ED Discharge Orders     None

## 2023-10-23 NOTE — ED Triage Notes (Signed)
Pt to ED by POV from home with c/o indigestion and SOB which began 3 days ago. Arrives A+O, VSS, NADN, no known covid exposure.

## 2023-10-24 ENCOUNTER — Inpatient Hospital Stay (HOSPITAL_COMMUNITY): Payer: Medicare Other

## 2023-10-24 ENCOUNTER — Emergency Department (HOSPITAL_COMMUNITY): Payer: Medicare Other

## 2023-10-24 DIAGNOSIS — D509 Iron deficiency anemia, unspecified: Secondary | ICD-10-CM | POA: Diagnosis not present

## 2023-10-24 DIAGNOSIS — K4090 Unilateral inguinal hernia, without obstruction or gangrene, not specified as recurrent: Secondary | ICD-10-CM | POA: Diagnosis present

## 2023-10-24 DIAGNOSIS — D62 Acute posthemorrhagic anemia: Secondary | ICD-10-CM | POA: Diagnosis present

## 2023-10-24 DIAGNOSIS — Z01810 Encounter for preprocedural cardiovascular examination: Secondary | ICD-10-CM | POA: Diagnosis not present

## 2023-10-24 DIAGNOSIS — I10 Essential (primary) hypertension: Secondary | ICD-10-CM | POA: Diagnosis not present

## 2023-10-24 DIAGNOSIS — K921 Melena: Secondary | ICD-10-CM | POA: Diagnosis present

## 2023-10-24 DIAGNOSIS — E876 Hypokalemia: Secondary | ICD-10-CM | POA: Diagnosis present

## 2023-10-24 DIAGNOSIS — D649 Anemia, unspecified: Secondary | ICD-10-CM | POA: Diagnosis present

## 2023-10-24 DIAGNOSIS — R188 Other ascites: Secondary | ICD-10-CM | POA: Diagnosis not present

## 2023-10-24 DIAGNOSIS — K319 Disease of stomach and duodenum, unspecified: Secondary | ICD-10-CM | POA: Diagnosis not present

## 2023-10-24 DIAGNOSIS — C772 Secondary and unspecified malignant neoplasm of intra-abdominal lymph nodes: Secondary | ICD-10-CM | POA: Diagnosis not present

## 2023-10-24 DIAGNOSIS — I359 Nonrheumatic aortic valve disorder, unspecified: Secondary | ICD-10-CM | POA: Diagnosis not present

## 2023-10-24 DIAGNOSIS — Z87891 Personal history of nicotine dependence: Secondary | ICD-10-CM | POA: Diagnosis not present

## 2023-10-24 DIAGNOSIS — K219 Gastro-esophageal reflux disease without esophagitis: Secondary | ICD-10-CM | POA: Diagnosis present

## 2023-10-24 DIAGNOSIS — I21A1 Myocardial infarction type 2: Secondary | ICD-10-CM | POA: Diagnosis present

## 2023-10-24 DIAGNOSIS — C170 Malignant neoplasm of duodenum: Secondary | ICD-10-CM | POA: Diagnosis present

## 2023-10-24 DIAGNOSIS — E871 Hypo-osmolality and hyponatremia: Secondary | ICD-10-CM | POA: Diagnosis present

## 2023-10-24 DIAGNOSIS — K922 Gastrointestinal hemorrhage, unspecified: Secondary | ICD-10-CM | POA: Diagnosis not present

## 2023-10-24 DIAGNOSIS — I5032 Chronic diastolic (congestive) heart failure: Secondary | ICD-10-CM | POA: Diagnosis present

## 2023-10-24 DIAGNOSIS — R0602 Shortness of breath: Secondary | ICD-10-CM | POA: Diagnosis present

## 2023-10-24 DIAGNOSIS — I700 Atherosclerosis of aorta: Secondary | ICD-10-CM | POA: Diagnosis present

## 2023-10-24 DIAGNOSIS — K449 Diaphragmatic hernia without obstruction or gangrene: Secondary | ICD-10-CM | POA: Diagnosis present

## 2023-10-24 DIAGNOSIS — E041 Nontoxic single thyroid nodule: Secondary | ICD-10-CM | POA: Diagnosis not present

## 2023-10-24 DIAGNOSIS — K269 Duodenal ulcer, unspecified as acute or chronic, without hemorrhage or perforation: Secondary | ICD-10-CM | POA: Diagnosis not present

## 2023-10-24 DIAGNOSIS — R590 Localized enlarged lymph nodes: Secondary | ICD-10-CM | POA: Diagnosis present

## 2023-10-24 DIAGNOSIS — I352 Nonrheumatic aortic (valve) stenosis with insufficiency: Secondary | ICD-10-CM | POA: Diagnosis present

## 2023-10-24 DIAGNOSIS — E559 Vitamin D deficiency, unspecified: Secondary | ICD-10-CM | POA: Diagnosis present

## 2023-10-24 DIAGNOSIS — D132 Benign neoplasm of duodenum: Secondary | ICD-10-CM | POA: Diagnosis not present

## 2023-10-24 DIAGNOSIS — D6489 Other specified anemias: Secondary | ICD-10-CM | POA: Diagnosis present

## 2023-10-24 DIAGNOSIS — I110 Hypertensive heart disease with heart failure: Secondary | ICD-10-CM | POA: Diagnosis present

## 2023-10-24 DIAGNOSIS — E785 Hyperlipidemia, unspecified: Secondary | ICD-10-CM | POA: Diagnosis present

## 2023-10-24 DIAGNOSIS — I421 Obstructive hypertrophic cardiomyopathy: Secondary | ICD-10-CM | POA: Diagnosis present

## 2023-10-24 DIAGNOSIS — M810 Age-related osteoporosis without current pathological fracture: Secondary | ICD-10-CM | POA: Diagnosis present

## 2023-10-24 DIAGNOSIS — I2510 Atherosclerotic heart disease of native coronary artery without angina pectoris: Secondary | ICD-10-CM | POA: Diagnosis present

## 2023-10-24 DIAGNOSIS — Z8249 Family history of ischemic heart disease and other diseases of the circulatory system: Secondary | ICD-10-CM | POA: Diagnosis not present

## 2023-10-24 DIAGNOSIS — K5730 Diverticulosis of large intestine without perforation or abscess without bleeding: Secondary | ICD-10-CM | POA: Diagnosis not present

## 2023-10-24 DIAGNOSIS — R195 Other fecal abnormalities: Secondary | ICD-10-CM | POA: Diagnosis not present

## 2023-10-24 DIAGNOSIS — K3189 Other diseases of stomach and duodenum: Secondary | ICD-10-CM | POA: Diagnosis not present

## 2023-10-24 DIAGNOSIS — I959 Hypotension, unspecified: Secondary | ICD-10-CM | POA: Diagnosis present

## 2023-10-24 DIAGNOSIS — Z885 Allergy status to narcotic agent status: Secondary | ICD-10-CM | POA: Diagnosis not present

## 2023-10-24 DIAGNOSIS — M7989 Other specified soft tissue disorders: Secondary | ICD-10-CM | POA: Diagnosis present

## 2023-10-24 HISTORY — DX: Obstructive hypertrophic cardiomyopathy: I42.1

## 2023-10-24 LAB — ECHOCARDIOGRAM COMPLETE
AV Mean grad: 20 mm[Hg]
AV Peak grad: 33.8 mm[Hg]
Ao pk vel: 2.91 m/s
Area-P 1/2: 3.45 cm2
Height: 57.008 in
MV M vel: 6.56 m/s
MV Peak grad: 172.1 mm[Hg]
Radius: 0.8 cm
S' Lateral: 2.7 cm
Weight: 1908.3 [oz_av]

## 2023-10-24 LAB — CBC WITH DIFFERENTIAL/PLATELET
Abs Immature Granulocytes: 0.03 10*3/uL (ref 0.00–0.07)
Basophils Absolute: 0.1 10*3/uL (ref 0.0–0.1)
Basophils Relative: 1 %
Eosinophils Absolute: 0.3 10*3/uL (ref 0.0–0.5)
Eosinophils Relative: 4 %
HCT: 25.9 % — ABNORMAL LOW (ref 36.0–46.0)
Hemoglobin: 8.7 g/dL — ABNORMAL LOW (ref 12.0–15.0)
Immature Granulocytes: 0 %
Lymphocytes Relative: 15 %
Lymphs Abs: 1.1 10*3/uL (ref 0.7–4.0)
MCH: 32.5 pg (ref 26.0–34.0)
MCHC: 33.6 g/dL (ref 30.0–36.0)
MCV: 96.6 fL (ref 80.0–100.0)
Monocytes Absolute: 0.5 10*3/uL (ref 0.1–1.0)
Monocytes Relative: 6 %
Neutro Abs: 5.6 10*3/uL (ref 1.7–7.7)
Neutrophils Relative %: 74 %
Platelets: 254 10*3/uL (ref 150–400)
RBC: 2.68 MIL/uL — ABNORMAL LOW (ref 3.87–5.11)
RDW: 14.7 % (ref 11.5–15.5)
WBC: 7.7 10*3/uL (ref 4.0–10.5)
nRBC: 0 % (ref 0.0–0.2)

## 2023-10-24 LAB — BASIC METABOLIC PANEL
Anion gap: 3 — ABNORMAL LOW (ref 5–15)
BUN: 19 mg/dL (ref 8–23)
CO2: 25 mmol/L (ref 22–32)
Calcium: 7.6 mg/dL — ABNORMAL LOW (ref 8.9–10.3)
Chloride: 108 mmol/L (ref 98–111)
Creatinine, Ser: 0.41 mg/dL — ABNORMAL LOW (ref 0.44–1.00)
GFR, Estimated: >60 mL/min (ref >60)
Glucose, Bld: 98 mg/dL (ref 70–99)
Potassium: 3.3 mmol/L — ABNORMAL LOW (ref 3.5–5.1)
Sodium: 136 mmol/L (ref 135–145)

## 2023-10-24 LAB — CBC
HCT: 22.7 % — ABNORMAL LOW (ref 36.0–46.0)
Hemoglobin: 7.5 g/dL — ABNORMAL LOW (ref 12.0–15.0)
MCH: 32.5 pg (ref 26.0–34.0)
MCHC: 33 g/dL (ref 30.0–36.0)
MCV: 98.3 fL (ref 80.0–100.0)
Platelets: 266 10*3/uL (ref 150–400)
RBC: 2.31 MIL/uL — ABNORMAL LOW (ref 3.87–5.11)
RDW: 13.8 % (ref 11.5–15.5)
WBC: 5.8 10*3/uL (ref 4.0–10.5)
nRBC: 0 % (ref 0.0–0.2)

## 2023-10-24 LAB — HEMOGLOBIN AND HEMATOCRIT, BLOOD
HCT: 26.1 % — ABNORMAL LOW (ref 36.0–46.0)
Hemoglobin: 8.4 g/dL — ABNORMAL LOW (ref 12.0–15.0)

## 2023-10-24 LAB — PHOSPHORUS: Phosphorus: 3.8 mg/dL (ref 2.5–4.6)

## 2023-10-24 LAB — TROPONIN I (HIGH SENSITIVITY)
Troponin I (High Sensitivity): 21 ng/L — ABNORMAL HIGH (ref <18)
Troponin I (High Sensitivity): 36 ng/L — ABNORMAL HIGH (ref <18)
Troponin I (High Sensitivity): 41 ng/L — ABNORMAL HIGH (ref <18)

## 2023-10-24 LAB — MAGNESIUM: Magnesium: 1.9 mg/dL (ref 1.7–2.4)

## 2023-10-24 LAB — MRSA NEXT GEN BY PCR, NASAL: MRSA by PCR Next Gen: NOT DETECTED

## 2023-10-24 LAB — IRON AND TIBC
Iron: 38 ug/dL (ref 28–170)
Saturation Ratios: 16 % (ref 10.4–31.8)
TIBC: 241 ug/dL — ABNORMAL LOW (ref 250–450)
UIBC: 203 ug/dL

## 2023-10-24 LAB — FERRITIN: Ferritin: 15 ng/mL (ref 11–307)

## 2023-10-24 LAB — POC OCCULT BLOOD, ED: Fecal Occult Bld: POSITIVE — AB

## 2023-10-24 LAB — PREPARE RBC (CROSSMATCH)

## 2023-10-24 MED ORDER — PANTOPRAZOLE SODIUM 40 MG IV SOLR
40.0000 mg | Freq: Two times a day (BID) | INTRAVENOUS | Status: DC
  Administered 2023-10-24 – 2023-10-27 (×7): 40 mg via INTRAVENOUS
  Filled 2023-10-24 (×7): qty 10

## 2023-10-24 MED ORDER — ACETAMINOPHEN 325 MG PO TABS: 650.0000 mg | ORAL_TABLET | Freq: Four times a day (QID) | ORAL | Status: DC | PRN

## 2023-10-24 MED ORDER — MELATONIN 5 MG PO TABS: 5.0000 mg | ORAL_TABLET | Freq: Every evening | ORAL | Status: DC | PRN

## 2023-10-24 MED ORDER — ONDANSETRON HCL 4 MG/2ML IJ SOLN
4.0000 mg | Freq: Once | INTRAMUSCULAR | Status: AC
  Administered 2023-10-24: 4 mg via INTRAVENOUS
  Filled 2023-10-24: qty 2

## 2023-10-24 MED ORDER — IOHEXOL 350 MG/ML SOLN
80.0000 mL | Freq: Once | INTRAVENOUS | Status: AC | PRN
  Administered 2023-10-24: 80 mL via INTRAVENOUS

## 2023-10-24 MED ORDER — SODIUM CHLORIDE 0.9 % IV SOLN: INTRAVENOUS | Status: AC | PRN

## 2023-10-24 MED ORDER — SODIUM CHLORIDE 0.9% IV SOLUTION: Freq: Once | INTRAVENOUS | Status: AC

## 2023-10-24 MED ORDER — POTASSIUM CHLORIDE 10 MEQ/100ML IV SOLN
10.0000 meq | INTRAVENOUS | Status: AC
  Administered 2023-10-24 (×4): 10 meq via INTRAVENOUS
  Filled 2023-10-24 (×4): qty 100

## 2023-10-24 MED ORDER — CHLORHEXIDINE GLUCONATE CLOTH 2 % EX PADS
6.0000 | MEDICATED_PAD | Freq: Every day | CUTANEOUS | Status: DC
  Administered 2023-10-24 – 2023-10-27 (×4): 6 via TOPICAL

## 2023-10-24 MED ORDER — SODIUM CHLORIDE 0.9 % IV BOLUS
1000.0000 mL | INTRAVENOUS | Status: AC
  Administered 2023-10-24: 1000 mL via INTRAVENOUS

## 2023-10-24 MED ORDER — MIDODRINE HCL 5 MG PO TABS: 10.0000 mg | ORAL_TABLET | Freq: Three times a day (TID) | ORAL | Status: DC | PRN

## 2023-10-24 MED ORDER — ORAL CARE MOUTH RINSE: 15.0000 mL | OROMUCOSAL | Status: DC | PRN

## 2023-10-24 MED ORDER — POLYETHYLENE GLYCOL 3350 17 G PO PACK: 17.0000 g | PACK | Freq: Every day | ORAL | Status: DC | PRN

## 2023-10-24 MED ORDER — PROCHLORPERAZINE EDISYLATE 10 MG/2ML IJ SOLN
5.0000 mg | Freq: Four times a day (QID) | INTRAMUSCULAR | Status: DC | PRN
  Administered 2023-10-25: 5 mg via INTRAVENOUS
  Filled 2023-10-24: qty 2

## 2023-10-24 NOTE — Consult Note (Signed)
Referring Provider: [facility] Primary Care Physician:  [name], MD Primary Gastroenterologist: [facility]/I am covering Dr. Loreta Ave and [place] for this weekend.  Reason for Consultation: Iron deficiency anemia, heme positive stool  HPI: Christine Morse is a 87 y.o. female with past medical history of aortic stenosis, CHF, hypertension, hyperlipidemia, history of hiatal hernia with Cameron's erosion in the past presented to the hospital with shortness of breath.  Upon initial evaluation, she was found to have anemia with hemoglobin of 9.3, which is dropped to 7.5 this morning.  Normal MCV.  Normal LFTs.  Occult blood positive.  GI is consulted for further evaluation.  Patient seen and examined at bedside.  She is complaining of intermittent black stool for more than 2 months now.  She denies seeing bright red blood per rectum.  Complaining of reflux symptoms.  Complaining of intermittent vomiting.  Denies trouble swallowing or pain while swallowing.  Denies abdominal pain.  Denies NSAID use.   Patient with history of anemia in the past with GI workup in 2013 including EGD, colonoscopy and capsule endoscopy showing 5 cm large hiatal hernia with Cameron's erosions which thought to be the cause for patient's anemia.  Past Medical History:  Diagnosis Date   Anemia    Aortic Stenosis    Cyst of ovary, right 2013   "/US" (10/04/2013)   Diverticulitis    Diverticulosis    GERD (gastroesophageal reflux disease)    History of blood transfusion    "just today; my HgB is 5.1" (10/04/2013)   History of gastric ulcer    Hyperlipidemia    Hypertension    Nephrolithiasis    "passed them on my own; went away after I quit drinking tea" (10/04/2013)   Osteoporosis    Vitamin D deficiency     Past Surgical History:  Procedure Laterality Date   ABDOMINAL HYSTERECTOMY  1981   ESOPHAGOGASTRODUODENOSCOPY N/A 10/04/2013   Procedure: ESOPHAGOGASTRODUODENOSCOPY (EGD);  Surgeon: Hart Carwin, MD;  Location:  [facility] ENDOSCOPY;  Service: Endoscopy;  Laterality: N/A;   LEFT OOPHORECTOMY Left 1981   MALONEY DILATION N/A 10/04/2013   Procedure: Elease Hashimoto DILATION;  Surgeon: Hart Carwin, MD;  Location: [facility] ENDOSCOPY;  Service: Endoscopy;  Laterality: N/A;    Prior to Admission medications   Medication Sig Start Date End Date Taking? Authorizing Provider  acetaminophen (TYLENOL) 500 MG tablet Take 1,000 mg by mouth every 6 (six) hours as needed for mild pain, moderate pain or headache.   Yes [provider]  hydrochlorothiazide (HYDRODIURIL) 12.5 MG tablet Take 12.5 mg by mouth every morning. 05/10/22  Yes [provider]  aspirin EC 81 MG tablet Take 1 tablet (81 mg total) by mouth daily. 08/23/13   Andrena Mews, DO  Calcium Carbonate-Vitamin D (CALCIUM + D PO) Take 2 tablets by mouth daily.     [provider]  ferrous sulfate 325 (65 FE) MG tablet Take 1 tablet (325 mg total) by mouth daily with breakfast. 10/05/13   Christiane Ha, MD  losartan (COZAAR) 100 MG tablet Take 100 mg by mouth daily. 06/22/22   [provider]  rosuvastatin (CRESTOR) 5 MG tablet Take 5 mg by mouth daily. 07/10/22   [provider]    Scheduled Meds:  Chlorhexidine Gluconate Cloth  6 each Topical Daily   pantoprazole (PROTONIX) IV  40 mg Intravenous BID   Continuous Infusions:  sodium chloride 20 mL/hr at 10/24/23 0924   potassium chloride 10 mEq (10/24/23 0932)  PRN Meds:.sodium chloride, acetaminophen, albuterol, melatonin, midodrine, mouth rinse, polyethylene glycol, prochlorperazine  Allergies as of 10/23/2023 - Review Complete 10/23/2023  Allergen Reaction Noted   Codeine  10/23/2023    Family History  Problem Relation Age of Onset   Heart failure Mother 67   Dementia Father    Ovarian cancer Sister    Colon cancer Neg Hx     Social History   Socioeconomic History   Marital status: Married    Spouse name: Not on file   Number of children: 2   Years of  education: Not on file   Highest education level: Not on file  Occupational History   Occupation: Retired  Tobacco Use   Smoking status: Former    Types: Cigarettes   Smokeless tobacco: Never  Substance and Sexual Activity   Alcohol use: No   Drug use: No   Sexual activity: Never  Other Topics Concern   Not on file  Social History Narrative   Lives at home with granddaughter she raised.     Social Determinants of Health   Financial Resource Strain: Not on file  Food Insecurity: No Food Insecurity (10/24/2023)   Hunger Vital Sign    Worried About Running Out of Food in the Last Year: Never true    Ran Out of Food in the Last Year: Never true  Transportation Needs: No Transportation Needs (10/24/2023)   PRAPARE - Administrator, Civil Service (Medical): No    Lack of Transportation (Non-Medical): No  Physical Activity: Not on file  Stress: Not on file  Social Connections: Not on file  Intimate Partner Violence: Not At Risk (10/24/2023)   Humiliation, Afraid, Rape, and Kick questionnaire    Fear of Current or Ex-Partner: No    Emotionally Abused: No    Physically Abused: No    Sexually Abused: No    Review of Systems: All negative except as stated above in HPI.  Physical Exam: Vital signs: Vitals:   10/24/23 0922 10/24/23 0924  BP: (!) 115/52 (!) 115/52  Pulse: 76 77  Resp: 20 18  Temp: 97.6 F (36.4 C)   SpO2: 99% 100%     General:   Elderly patient, not in acute distress HEENT -NS, atraumatic, extraocular movement intact Lungs: No visible respiratory distress Heart:  Regular rate and rhythm; no murmurs, clicks, rubs,  or gallops. Abdomen: Soft, nontender, nondistended, bowel sound present, no peritoneal signs Mood and affect normal Alert and oriented x 3 Rectal:  Deferred  GI:  Lab Results: Recent Labs    10/23/23 2138 10/24/23 0325  WBC 10.7* 5.8  HGB 9.3* 7.5*  HCT 28.2* 22.7*  PLT 409* 266   BMET Recent Labs    10/23/23 2138  10/24/23 0325  NA 132* 136  K 3.5 3.3*  CL 99 108  CO2 25 25  GLUCOSE 108* 98  BUN 23 19  CREATININE 0.46 0.41*  CALCIUM 9.0 7.6*   LFT Recent Labs    10/23/23 2138  PROT 6.3*  ALBUMIN 3.8  AST 20  ALT 13  ALKPHOS 57  BILITOT 0.8   PT/INR No results for input(s): "LABPROT", "INR" in the last 72 hours.   Studies/Results: CT Angio Chest Pulmonary Embolism (PE) W or WO Contrast  Result Date: 10/24/2023 CLINICAL DATA:  Pulmonary embolism (PE) suspected, high prob EXAM: CT ANGIOGRAPHY CHEST WITH CONTRAST TECHNIQUE: Multidetector CT imaging of the chest was performed using the standard protocol during bolus administration of intravenous contrast. Multiplanar  CT image reconstructions and MIPs were obtained to evaluate the vascular anatomy. RADIATION DOSE REDUCTION: This exam was performed according to the departmental dose-optimization program which includes automated exposure control, adjustment of the mA and/or kV according to patient size and/or use of iterative reconstruction technique. CONTRAST:  80mL OMNIPAQUE IOHEXOL 350 MG/ML SOLN COMPARISON:  None Available. FINDINGS: Cardiovascular: No filling defects in the pulmonary arteries to suggest pulmonary emboli. Heart is normal size. Aorta is normal caliber. Coronary artery and aortic atherosclerosis. Mediastinum/Nodes: Left axillary enlarged lymph node measuring 12 mm in short axis diameter on image 47. No mediastinal or hilar adenopathy. Trachea and esophagus are unremarkable. Moderate-sized hiatal hernia. Left thyroid nodule measures 12 mm. Not clinically significant; no follow-up imaging recommended (ref: J Am Coll Radiol. 2015 Feb;12(2): 143-50). Lungs/Pleura: Lungs are clear. No focal airspace opacities or suspicious nodules. No effusions. Upper Abdomen: No acute findings Musculoskeletal: Chest wall soft tissues are unremarkable. No acute bony abnormality. Review of the MIP images confirms the above findings. IMPRESSION: No evidence of  pulmonary embolus. No acute cardiopulmonary disease. Moderate sized hiatal hernia. Coronary artery disease. Aortic Atherosclerosis (ICD10-I70.0). Electronically Signed   By: Charlett Nose M.D.   On: 10/24/2023 00:26   DG Chest 2 View  Result Date: 10/23/2023 CLINICAL DATA:  Shortness of breath EXAM: CHEST - 2 VIEW COMPARISON:  09/19/2017 FINDINGS: Mild central left upper lobe opacity, raising the possibility of early pneumonia. Right lung is clear. No pleural effusion or pneumothorax. The heart is normal in size.  Thoracic aortic atherosclerosis. Visualized osseous structures are within normal limits. IMPRESSION: Mild central left upper lobe opacity, raising the possibility of early pneumonia. Electronically Signed   By: Charline Bills M.D.   On: 10/23/2023 23:27    Impression/Plan: -Melena with acute on chronic anemia.  Heme positive stool.  History of hiatal hernia with Cameron's erosions in the past. -Shortness of breath.  Likely from anemia but could be multifactorial.  Currently getting echocardiogram.  Recommendations ------------------------- -Plan for EGD tomorrow with [facility]  -Continue IV twice daily PPI for now. -Okay to have soft diet today.  Keep n.p.o. past midnight.  Repeat labs in the morning. -Please call [facility] on-call personal for any after-hours needs.  Risks (bleeding, infection, bowel perforation that could require surgery, sedation-related changes in cardiopulmonary systems), benefits (identification and possible treatment of source of symptoms, exclusion of certain causes of symptoms), and alternatives (watchful waiting, radiographic imaging studies, empiric medical treatment)  were explained to patient/family in detail and patient wishes to proceed.     LOS: 0 days   Kathi Der  MD, FACP 10/24/2023, 9:37 AM  Contact #  [phone]

## 2023-10-24 NOTE — Plan of Care (Signed)
  Problem: Clinical Measurements: Goal: Ability to maintain clinical measurements within normal limits will improve Outcome: Progressing Goal: Respiratory complications will improve Outcome: Progressing   Problem: Activity: Goal: Risk for activity intolerance will decrease Outcome: Progressing   Problem: Nutrition: Goal: Adequate nutrition will be maintained Outcome: Progressing   Problem: Coping: Goal: Level of anxiety will decrease Outcome: Progressing   Problem: Elimination: Goal: Will not experience complications related to bowel motility Outcome: Progressing Goal: Will not experience complications related to urinary retention Outcome: Progressing

## 2023-10-24 NOTE — ED Notes (Signed)
Blood consent was signed by patient.

## 2023-10-24 NOTE — Progress Notes (Addendum)
This is a very pleasant 87 year old lady with past medical history of GI bleed in the setting of large 5 cm nonreducible hiatal hernia with superficial Cameron erosions came to the ED with exertional shortness of breath and admitted after midnight.  Was found to have anemia.  Has history of melena for last couple of months.  Hemoglobin 7.5, 1 unit of PRBC transfusion was ordered by admitting hospitalist which is currently going on.  Patient seen and examined in stepdown unit.  Her family member was at the bedside, patient has no complaint as long as she is in the bed.  No abdominal pain or nausea.  She is n.p.o. waiting for GI to be seen and clarification of the plans.  Otherwise hemodynamically stable.  Troponins only slightly elevated and in setting of no ACS symptoms such as chest pain, as well as no ST-T wave changes on the EKG, this is likely demand ischemia.  Potassium slightly low, will replenish that.

## 2023-10-24 NOTE — ED Notes (Signed)
ED TO INPATIENT HANDOFF REPORT  ED Nurse Name and Phone #: Jacqulyn Liner EMTP   S Name/Age/Gender Christine Morse 87 y.o. female Room/Bed: WA25/WA25  Code Status   Code Status: Full Code  Home/SNF/Other Home Patient oriented to: self, place, time, and situation Is this baseline? Yes   Triage Complete: Triage complete  Chief Complaint Symptomatic anemia [D64.9]  Triage Note Pt to ED by POV from home with c/o indigestion and SOB which began 3 days ago. Arrives A+O, VSS, NADN, no known covid exposure.   Allergies Allergies  Allergen Reactions   Codeine     Level of Care/Admitting Diagnosis ED Disposition     ED Disposition  Admit   Condition  --   Comment  Hospital Area: [facility] [[id number]]  Level of Care: Stepdown [14]  Admit to SDU based on following criteria: Severe physiological/psychological symptoms:  Any diagnosis requiring assessment & intervention at least every 4 hours on an ongoing basis to obtain desired patient outcomes including stability and rehabilitation  May admit patient to [facility] or [facility] if equivalent level of care is available:: Yes  Covid Evaluation: Asymptomatic - no recent exposure (last 10 days) testing not required  Diagnosis: Symptomatic anemia [1610960]  Admitting Physician: Darlin Drop [[id number]]  Attending Physician: Darlin Drop [[id number]]  Certification:: I certify this patient will need inpatient services for at least 2 midnights          B Medical/Surgery History Past Medical History:  Diagnosis Date   Anemia    Aortic Stenosis    Cyst of ovary, right 2013   "/US" (10/04/2013)   Diverticulitis    Diverticulosis    GERD (gastroesophageal reflux disease)    History of blood transfusion    "just today; my HgB is 5.1" (10/04/2013)   History of gastric ulcer    Hyperlipidemia    Hypertension    Nephrolithiasis    "passed them on my own; went away after I quit drinking tea" (10/04/2013)    Osteoporosis    Vitamin D deficiency    Past Surgical History:  Procedure Laterality Date   ABDOMINAL HYSTERECTOMY  1981   ESOPHAGOGASTRODUODENOSCOPY N/A 10/04/2013   Procedure: ESOPHAGOGASTRODUODENOSCOPY (EGD);  Surgeon: Hart Carwin, MD;  Location: [facility] ENDOSCOPY;  Service: Endoscopy;  Laterality: N/A;   LEFT OOPHORECTOMY Left 1981   MALONEY DILATION N/A 10/04/2013   Procedure: Elease Hashimoto DILATION;  Surgeon: Hart Carwin, MD;  Location: [facility] ENDOSCOPY;  Service: Endoscopy;  Laterality: N/A;     A IV Location/Drains/Wounds Patient Lines/Drains/Airways Status     Active Line/Drains/Airways     Name Placement date Placement time Site Days   Peripheral IV 10/23/23 20 G 1" Right Antecubital 10/23/23  2231  Antecubital  1            Intake/Output Last 24 hours No intake or output data in the 24 hours ending 10/24/23 0453  Labs/Imaging Results for orders placed or performed during the hospital encounter of 10/23/23 (from the past 48 hour(s))  Brain natriuretic peptide     Status: Abnormal   Collection Time: 10/23/23  9:37 PM  Result Value Ref Range   B Natriuretic Peptide 177.4 (H) 0.0 - 100.0 pg/mL    Comment: Performed at [facility], [address] [place], [place] [address]  CBC with Differential     Status: Abnormal   Collection Time: 10/23/23  9:38 PM  Result Value Ref Range   WBC 10.7 (H)  4.0 - 10.5 K/uL   RBC 2.86 (L) 3.87 - 5.11 MIL/uL   Hemoglobin 9.3 (L) 12.0 - 15.0 g/dL   HCT 30.8 (L) 65.7 - 84.6 %   MCV 98.6 80.0 - 100.0 fL   MCH 32.5 26.0 - 34.0 pg   MCHC 33.0 30.0 - 36.0 g/dL   RDW 96.2 95.2 - 84.1 %   Platelets 409 (H) 150 - 400 K/uL   nRBC 0.0 0.0 - 0.2 %   Neutrophils Relative % 84 %   Neutro Abs 9.1 (H) 1.7 - 7.7 K/uL   Lymphocytes Relative 9 %   Lymphs Abs 0.9 0.7 - 4.0 K/uL   Monocytes Relative 4 %   Monocytes Absolute 0.5 0.1 - 1.0 K/uL   Eosinophils Relative 1 %   Eosinophils Absolute 0.1 0.0 - 0.5 K/uL   Basophils Relative  1 %   Basophils Absolute 0.1 0.0 - 0.1 K/uL   Immature Granulocytes 1 %   Abs Immature Granulocytes 0.06 0.00 - 0.07 K/uL    Comment: Performed at [facility], [address] [place], [place] [address]  Comprehensive metabolic panel     Status: Abnormal   Collection Time: 10/23/23  9:38 PM  Result Value Ref Range   Sodium 132 (L) 135 - 145 mmol/L   Potassium 3.5 3.5 - 5.1 mmol/L   Chloride 99 98 - 111 mmol/L   CO2 25 22 - 32 mmol/L   Glucose, Bld 108 (H) 70 - 99 mg/dL    Comment: Glucose reference range applies only to samples taken after fasting for at least 8 hours.   BUN 23 8 - 23 mg/dL   Creatinine, Ser 1.02 0.44 - 1.00 mg/dL   Calcium 9.0 8.9 - 72.5 mg/dL   Total Protein 6.3 (L) 6.5 - 8.1 g/dL   Albumin 3.8 3.5 - 5.0 g/dL   AST 20 15 - 41 U/L   ALT 13 0 - 44 U/L   Alkaline Phosphatase 57 38 - 126 U/L   Total Bilirubin 0.8 0.3 - 1.2 mg/dL   GFR, Estimated >36 >64 mL/min    Comment: (NOTE) Calculated using the CKD-EPI Creatinine Equation (2021)    Anion gap 8 5 - 15    Comment: Performed at [facility], [address] [place], [place] [address]  Troponin I (High Sensitivity)     Status: None   Collection Time: 10/23/23  9:38 PM  Result Value Ref Range   Troponin I (High Sensitivity) 17 <18 ng/L    Comment: (NOTE) Elevated high sensitivity troponin I (hsTnI) values and significant  changes across serial measurements may suggest ACS but many other  chronic and acute conditions are known to elevate hsTnI results.  Refer to the "Links" section for chest pain algorithms and additional  guidance. Performed at [facility], [address], [place], [place] [address]   Troponin I (High Sensitivity)     Status: Abnormal   Collection Time: 10/23/23 11:45 PM  Result Value Ref Range   Troponin I (High Sensitivity) 21 (H) <18 ng/L    Comment: (NOTE) Elevated high sensitivity troponin I (hsTnI) values and significant   changes across serial measurements may suggest ACS but many other  chronic and acute conditions are known to elevate hsTnI results.  Refer to the "Links" section for chest pain algorithms and additional  guidance. Performed at [facility], [address] [place], [place] [address]   POC occult blood, ED Provider will  collect     Status: Abnormal   Collection Time: 10/24/23  1:53 AM  Result Value Ref Range   Fecal Occult Bld POSITIVE (A) NEGATIVE  CBC     Status: Abnormal   Collection Time: 10/24/23  3:25 AM  Result Value Ref Range   WBC 5.8 4.0 - 10.5 K/uL   RBC 2.31 (L) 3.87 - 5.11 MIL/uL   Hemoglobin 7.5 (L) 12.0 - 15.0 g/dL   HCT 84.6 (L) 96.2 - 95.2 %   MCV 98.3 80.0 - 100.0 fL   MCH 32.5 26.0 - 34.0 pg   MCHC 33.0 30.0 - 36.0 g/dL   RDW 84.1 32.4 - 40.1 %   Platelets 266 150 - 400 K/uL   nRBC 0.0 0.0 - 0.2 %    Comment: Performed at [facility], [address], [place], [place] [address]   CT Angio Chest Pulmonary Embolism (PE) W or WO Contrast  Result Date: 10/24/2023 CLINICAL DATA:  Pulmonary embolism (PE) suspected, high prob EXAM: CT ANGIOGRAPHY CHEST WITH CONTRAST TECHNIQUE: Multidetector CT imaging of the chest was performed using the standard protocol during bolus administration of intravenous contrast. Multiplanar CT image reconstructions and MIPs were obtained to evaluate the vascular anatomy. RADIATION DOSE REDUCTION: This exam was performed according to the departmental dose-optimization program which includes automated exposure control, adjustment of the mA and/or kV according to patient size and/or use of iterative reconstruction technique. CONTRAST:  80mL OMNIPAQUE IOHEXOL 350 MG/ML SOLN COMPARISON:  None Available. FINDINGS: Cardiovascular: No filling defects in the pulmonary arteries to suggest pulmonary emboli. Heart is normal size. Aorta is normal caliber. Coronary artery and aortic atherosclerosis. Mediastinum/Nodes: Left  axillary enlarged lymph node measuring 12 mm in short axis diameter on image 47. No mediastinal or hilar adenopathy. Trachea and esophagus are unremarkable. Moderate-sized hiatal hernia. Left thyroid nodule measures 12 mm. Not clinically significant; no follow-up imaging recommended (ref: J Am Coll Radiol. 2015 Feb;12(2): 143-50). Lungs/Pleura: Lungs are clear. No focal airspace opacities or suspicious nodules. No effusions. Upper Abdomen: No acute findings Musculoskeletal: Chest wall soft tissues are unremarkable. No acute bony abnormality. Review of the MIP images confirms the above findings. IMPRESSION: No evidence of pulmonary embolus. No acute cardiopulmonary disease. Moderate sized hiatal hernia. Coronary artery disease. Aortic Atherosclerosis (ICD10-I70.0). Electronically Signed   By: Charlett Nose M.D.   On: 10/24/2023 00:26   DG Chest 2 View  Result Date: 10/23/2023 CLINICAL DATA:  Shortness of breath EXAM: CHEST - 2 VIEW COMPARISON:  09/19/2017 FINDINGS: Mild central left upper lobe opacity, raising the possibility of early pneumonia. Right lung is clear. No pleural effusion or pneumothorax. The heart is normal in size.  Thoracic aortic atherosclerosis. Visualized osseous structures are within normal limits. IMPRESSION: Mild central left upper lobe opacity, raising the possibility of early pneumonia. Electronically Signed   By: Charline Bills M.D.   On: 10/23/2023 23:27    Pending Labs Unresulted Labs (From admission, onward)     Start     Ordered   10/24/23 0500  Basic metabolic panel  Tomorrow morning,   R        10/24/23 0233   10/24/23 0500  Magnesium  Tomorrow morning,   R        10/24/23 0233   10/24/23 0500  Phosphorus  Tomorrow morning,   R        10/24/23 0233   10/24/23 0450  Iron and TIBC  Add-on,   AD  10/24/23 0449   10/24/23 0450  Ferritin  Add-on,   AD        10/24/23 0449   10/24/23 0406  Type and screen [facility]  (Blood Administration  Adult)  ONCE - STAT,   STAT       Comments: [facility]    10/24/23 0405   10/24/23 0406  Prepare RBC (crossmatch)  (Blood Administration Adult)  ONCE - STAT,   STAT       Question Answer Comment  # of Units 1 unit   Transfusion Indications Hemoglobin 8 gm/dL or less and orthopedic or cardiac surgery or pre-existing cardiac condition   Number of Units to Keep Ahead NO units ahead   Instructions: Transfuse   If emergent release call blood bank [facility] [phone]      10/24/23 0405   10/24/23 0234  Hemoglobin and hematocrit, blood  Now then every 6 hours,   R (with TIMED occurrences)      10/24/23 0233            Vitals/Pain Today's Vitals   10/24/23 0230 10/24/23 0315 10/24/23 0415 10/24/23 0432  BP: (!) 104/54 (!) 102/56 (!) 82/51 104/60  Pulse: 88 84 73 86  Resp: 20 18 16  (!) 22  Temp: 98.1 F (36.7 C)     TempSrc: Oral     SpO2: 99% 99% 100% 100%  PainSc:        Isolation Precautions No active isolations  Medications Medications  albuterol (VENTOLIN HFA) 108 (90 Base) MCG/ACT inhaler 2 puff (has no administration in time range)  acetaminophen (TYLENOL) tablet 650 mg (has no administration in time range)  prochlorperazine (COMPAZINE) injection 5 mg (has no administration in time range)  melatonin tablet 5 mg (has no administration in time range)  polyethylene glycol (MIRALAX / GLYCOLAX) packet 17 g (has no administration in time range)  pantoprazole (PROTONIX) injection 40 mg (40 mg Intravenous Given 10/24/23 0446)  0.9 %  sodium chloride infusion (Manually program via Guardrails IV Fluids) (has no administration in time range)  midodrine (PROAMATINE) tablet 10 mg (has no administration in time range)  sodium chloride 0.9 % bolus 1,000 mL (0 mLs Intravenous Stopped 10/24/23 0017)  iohexol (OMNIPAQUE) 350 MG/ML injection 80 mL (80 mLs Intravenous Contrast Given 10/24/23 0011)  ondansetron (ZOFRAN) injection 4 mg (4 mg Intravenous Given 10/24/23  0053)  sodium chloride 0.9 % bolus 1,000 mL (1,000 mLs Intravenous New Bag/Given 10/24/23 0429)    Mobility walks     Focused Assessments Patient originally came in for SOB. Workup was reassuring. However patient expressed black stools and did the fecal occult test. Showed positive. Patient has been intermittently hypotensive. Bolus of NS going at this time and BP has corrected as of now. Patient will receive transfusion and consult with GI. Patient is A&Ox4 and has been using the bed pan. Family at bedside with her. Patient has no other complaints at this time.    R Recommendations: See Admitting Provider Note  Report given to:   Additional Notes:

## 2023-10-24 NOTE — H&P (Addendum)
History and Physical  EMIRETH Morse YQI:[id number] DOB: 12-01-1936 DOA: 10/23/2023  Referring physician: Barrie Dunker, PA-EDP  PCP: [name], MD  Outpatient Specialists: GI, cardiology. Patient coming from: Home.  Chief Complaint: Shortness of breath.  HPI: Christine Morse is a 87 y.o. female with medical history significant for prior upper GI bleed in the setting of large 5 cm nonreducible hiatal hernia with superficial Cameron erosions seen on EGD done in 2014, valvular heart disease, nonrheumatic aortic valve stenosis, chronic HFpEF, hypertension, hyperlipidemia, iron deficiency anemia, who presents to the ED from home due to worsening dyspnea with minimal exertion x 3 days.  Associated with chest tightness intermittently.  No palpitations.  No recent use of NSAIDs.  No abdominal pain.  Denies any overt bleeding.  In the ED, noted to have soft BPs with tachycardia upon standing.  Lab studies notable for acute blood loss anemia with hemoglobin of 9.3 and positive FOBT.  The patient received IV fluid bolus NS 1 L x 1.  TRH, hospitalist service, was asked to admit.  Serial H&H ordered.  Repeat hemoglobin 7.5 from 9.3.  1 unit PRBCs ordered.  Started IV PPI twice daily.  [facility], Dr. Lorenso Quarry consulted for possible EGD.  Another bolus of 1 L IV fluid NS given for soft BPs.  ED Course: Temperature 98.1.  BP 95/57, pulse 78, respiratory rate 13, O2 saturation 97% on room air.  Lab studies notable for WBC 10.7, hemoglobin 9.3, platelet count 4 9.  Troponin 17, 21.  Serum sodium 132.  Review of Systems: Review of systems as noted in the HPI. All other systems reviewed and are negative.   Past Medical History:  Diagnosis Date   Anemia    Aortic Stenosis    Cyst of ovary, right 2013   "/US" (10/04/2013)   Diverticulitis    Diverticulosis    GERD (gastroesophageal reflux disease)    History of blood transfusion    "just today; my HgB is 5.1" (10/04/2013)   History of gastric  ulcer    Hyperlipidemia    Hypertension    Nephrolithiasis    "passed them on my own; went away after I quit drinking tea" (10/04/2013)   Osteoporosis    Vitamin D deficiency    Past Surgical History:  Procedure Laterality Date   ABDOMINAL HYSTERECTOMY  1981   ESOPHAGOGASTRODUODENOSCOPY N/A 10/04/2013   Procedure: ESOPHAGOGASTRODUODENOSCOPY (EGD);  Surgeon: Hart Carwin, MD;  Location: [facility] ENDOSCOPY;  Service: Endoscopy;  Laterality: N/A;   LEFT OOPHORECTOMY Left 1981   MALONEY DILATION N/A 10/04/2013   Procedure: Elease Hashimoto DILATION;  Surgeon: Hart Carwin, MD;  Location: [facility] ENDOSCOPY;  Service: Endoscopy;  Laterality: N/A;    Social History:  reports that she has quit smoking. Her smoking use included cigarettes. She has never used smokeless tobacco. She reports that she does not drink alcohol and does not use drugs.   Allergies  Allergen Reactions   Codeine     Family History  Problem Relation Age of Onset   Heart failure Mother 77   Dementia Father    Ovarian cancer Sister    Colon cancer Neg Hx       Prior to Admission medications   Medication Sig Start Date End Date Taking? Authorizing Provider  acetaminophen (TYLENOL) 500 MG tablet Take 1,000 mg by mouth every 6 (six) hours as needed for mild pain, moderate pain or headache.   Yes [provider]  hydrochlorothiazide (HYDRODIURIL) 12.5 MG tablet Take  12.5 mg by mouth every morning. 05/10/22  Yes [provider]  aspirin EC 81 MG tablet Take 1 tablet (81 mg total) by mouth daily. 08/23/13   Andrena Mews, DO  Calcium Carbonate-Vitamin D (CALCIUM + D PO) Take 2 tablets by mouth daily.     [provider]  ferrous sulfate 325 (65 FE) MG tablet Take 1 tablet (325 mg total) by mouth daily with breakfast. 10/05/13   Christiane Ha, MD  losartan (COZAAR) 100 MG tablet Take 100 mg by mouth daily. 06/22/22   [provider]  rosuvastatin (CRESTOR) 5 MG tablet Take 5 mg by mouth daily.  07/10/22   [provider]    Physical Exam: BP (!) 102/56   Pulse 84   Temp 98.1 F (36.7 C) (Oral)   Resp 18   SpO2 99%   General: 87 y.o. year-old female well developed well nourished in no acute distress.  Alert and oriented x3. Cardiovascular: Regular rate and rhythm with no rubs or gallops.  No thyromegaly or JVD noted.  No lower extremity edema. 2/4 pulses in all 4 extremities. Respiratory: Clear to auscultation with no wheezes or rales. Good inspiratory effort. Abdomen: Soft nontender nondistended with normal bowel sounds x4 quadrants. Muskuloskeletal: No cyanosis, clubbing or edema noted bilaterally Neuro: CN II-XII intact, strength, sensation, reflexes Skin: No ulcerative lesions noted or rashes Psychiatry: Judgement and insight appear normal. Mood is appropriate for condition and setting          Labs on Admission:  Basic Metabolic Panel: Recent Labs  Lab 10/23/23 2138  NA 132*  K 3.5  CL 99  CO2 25  GLUCOSE 108*  BUN 23  CREATININE 0.46  CALCIUM 9.0   Liver Function Tests: Recent Labs  Lab 10/23/23 2138  AST 20  ALT 13  ALKPHOS 57  BILITOT 0.8  PROT 6.3*  ALBUMIN 3.8   No results for input(s): "LIPASE", "AMYLASE" in the last 168 hours. No results for input(s): "AMMONIA" in the last 168 hours. CBC: Recent Labs  Lab 10/23/23 2138 10/24/23 0325  WBC 10.7* 5.8  NEUTROABS 9.1*  --   HGB 9.3* 7.5*  HCT 28.2* 22.7*  MCV 98.6 98.3  PLT 409* 266   Cardiac Enzymes: No results for input(s): "CKTOTAL", "CKMB", "CKMBINDEX", "TROPONINI" in the last 168 hours.  BNP (last 3 results) Recent Labs    10/23/23 2137  BNP 177.4*    ProBNP (last 3 results) No results for input(s): "PROBNP" in the last 8760 hours.  CBG: No results for input(s): "GLUCAP" in the last 168 hours.  Radiological Exams on Admission: CT Angio Chest Pulmonary Embolism (PE) W or WO Contrast  Result Date: 10/24/2023 CLINICAL DATA:  Pulmonary embolism (PE) suspected,  high prob EXAM: CT ANGIOGRAPHY CHEST WITH CONTRAST TECHNIQUE: Multidetector CT imaging of the chest was performed using the standard protocol during bolus administration of intravenous contrast. Multiplanar CT image reconstructions and MIPs were obtained to evaluate the vascular anatomy. RADIATION DOSE REDUCTION: This exam was performed according to the departmental dose-optimization program which includes automated exposure control, adjustment of the mA and/or kV according to patient size and/or use of iterative reconstruction technique. CONTRAST:  80mL OMNIPAQUE IOHEXOL 350 MG/ML SOLN COMPARISON:  None Available. FINDINGS: Cardiovascular: No filling defects in the pulmonary arteries to suggest pulmonary emboli. Heart is normal size. Aorta is normal caliber. Coronary artery and aortic atherosclerosis. Mediastinum/Nodes: Left axillary enlarged lymph node measuring 12 mm in short axis diameter on image 47. No  mediastinal or hilar adenopathy. Trachea and esophagus are unremarkable. Moderate-sized hiatal hernia. Left thyroid nodule measures 12 mm. Not clinically significant; no follow-up imaging recommended (ref: J Am Coll Radiol. 2015 Feb;12(2): 143-50). Lungs/Pleura: Lungs are clear. No focal airspace opacities or suspicious nodules. No effusions. Upper Abdomen: No acute findings Musculoskeletal: Chest wall soft tissues are unremarkable. No acute bony abnormality. Review of the MIP images confirms the above findings. IMPRESSION: No evidence of pulmonary embolus. No acute cardiopulmonary disease. Moderate sized hiatal hernia. Coronary artery disease. Aortic Atherosclerosis (ICD10-I70.0). Electronically Signed   By: Charlett Nose M.D.   On: 10/24/2023 00:26   DG Chest 2 View  Result Date: 10/23/2023 CLINICAL DATA:  Shortness of breath EXAM: CHEST - 2 VIEW COMPARISON:  09/19/2017 FINDINGS: Mild central left upper lobe opacity, raising the possibility of early pneumonia. Right lung is clear. No pleural effusion or  pneumothorax. The heart is normal in size.  Thoracic aortic atherosclerosis. Visualized osseous structures are within normal limits. IMPRESSION: Mild central left upper lobe opacity, raising the possibility of early pneumonia. Electronically Signed   By: Charline Bills M.D.   On: 10/23/2023 23:27    EKG: I independently viewed the EKG done and my findings are as followed: Sinus rhythm rate of 97.  Nonspecific ST-T changes.  QTc 491.  Assessment/Plan Present on Admission:  Symptomatic anemia  Principal Problem:   Symptomatic anemia  Symptomatic anemia with concern for upper GI bleed Presented with hemoglobin of 9.3 from baseline of 12 in 2018 Serial H&H in place Repeat hemoglobin 7.5 from 9.3 Continue IV PPI 40 mg twice daily 1 unit PRBCs ordered to be transfused for hemoglobin of 7.5 Maintain MAP greater than 65 [facility], Dr. Lorenso Quarry consulted N.p.o. until seen by GI  History of large 5 cm nonreducible hiatal hernia with superficial Cameron erosions seen on EGD done in 2014 IV PPI twice daily GI consulted for possible EGD  Elevated troponin, suspect demand ischemia in the setting of acute blood loss anemia Intermittent chest tightness Initial high-sensitivity troponin 17, repeat 21, continue to trend until peaks No evidence of acute ischemia on twelve-lead EKG. Follow 2D echo  Nonrheumatic mild to moderate aortic stenosis, 2D echo 08/13/2022 Obtain 2D echo to reassess valvular disease Follows with cardiology outpatient  HFpEF Last 2D echo done on 08/13/2022 showed LVEF 60 to 65% with grade 1 diastolic dysfunction. Euvolemic on exam Closely monitor volume status while on IV fluid hydration Start strict I's and O's and daily weight  Hypertension BPs are currently soft Hold off home oral antihypertensives Maintain MAP greater than 65 Midodrine as needed with parameters. Closely monitor vital signs.  Hyperlipidemia Resume home Crestor when okay with GI  Iron  deficiency anemia Resume home iron supplement when okay with GI Obtain iron studies.   Critical care time: 65 minutes.   DVT prophylaxis: SCDs  Code Status: Full code  Family Communication: Daughter at bedside.  Disposition Plan: Admitted to stepdown unit  Consults called: [facility]  Admission status: Inpatient status   Status is: Inpatient The patient requires at least 2 midnights for further evaluation and treatment of present condition.   Darlin Drop MD [facility] Pager [phone]  If 7PM-7AM, please contact night-coverage [email or web address] Password [email or web address]  10/24/2023, 4:03 AM

## 2023-10-24 NOTE — ED Notes (Signed)
Ambulated patient with portable O2 reader. Patient walked great and oxygen stayed between 96-100. Patient however said she felt little winded on the way back despite the o2 reading. Patient stayed tachy in the 110-115 during walk.

## 2023-10-24 NOTE — ED Notes (Addendum)
Admitting provider C.Margo Aye was contacted about patients blood pressure. She informed me orders placed for type and screen and to receive blood. She placed orders for fluid bolus.

## 2023-10-25 ENCOUNTER — Encounter (HOSPITAL_COMMUNITY)
Admission: EM | Disposition: A | Discharge status: Home / Self Care | Payer: Self-pay | Source: Home / Self Care | Attending: Family Medicine

## 2023-10-25 ENCOUNTER — Inpatient Hospital Stay (HOSPITAL_COMMUNITY): Payer: Medicare Other | Admitting: Anesthesiology

## 2023-10-25 ENCOUNTER — Encounter (HOSPITAL_COMMUNITY): Payer: Self-pay | Admitting: Internal Medicine

## 2023-10-25 DIAGNOSIS — D649 Anemia, unspecified: Secondary | ICD-10-CM | POA: Diagnosis not present

## 2023-10-25 DIAGNOSIS — D509 Iron deficiency anemia, unspecified: Secondary | ICD-10-CM | POA: Diagnosis not present

## 2023-10-25 DIAGNOSIS — K3189 Other diseases of stomach and duodenum: Secondary | ICD-10-CM

## 2023-10-25 DIAGNOSIS — C170 Malignant neoplasm of duodenum: Secondary | ICD-10-CM | POA: Diagnosis not present

## 2023-10-25 HISTORY — PX: BIOPSY: SHX5522

## 2023-10-25 HISTORY — PX: ESOPHAGOGASTRODUODENOSCOPY (EGD) WITH PROPOFOL: SHX5813

## 2023-10-25 LAB — BPAM RBC
Blood Product Expiration Date: 202411282359
ISSUE DATE / TIME: 202411030622
Unit Type and Rh: 5100

## 2023-10-25 LAB — CBC WITH DIFFERENTIAL/PLATELET
Abs Immature Granulocytes: 0.03 10*3/uL (ref 0.00–0.07)
Basophils Absolute: 0.1 10*3/uL (ref 0.0–0.1)
Basophils Relative: 1 %
Eosinophils Absolute: 0.4 10*3/uL (ref 0.0–0.5)
Eosinophils Relative: 7 %
HCT: 27.4 % — ABNORMAL LOW (ref 36.0–46.0)
Hemoglobin: 8.9 g/dL — ABNORMAL LOW (ref 12.0–15.0)
Immature Granulocytes: 1 %
Lymphocytes Relative: 13 %
Lymphs Abs: 0.8 10*3/uL (ref 0.7–4.0)
MCH: 32.2 pg (ref 26.0–34.0)
MCHC: 32.5 g/dL (ref 30.0–36.0)
MCV: 99.3 fL (ref 80.0–100.0)
Monocytes Absolute: 0.4 10*3/uL (ref 0.1–1.0)
Monocytes Relative: 6 %
Neutro Abs: 4.4 10*3/uL (ref 1.7–7.7)
Neutrophils Relative %: 72 %
Platelets: 280 10*3/uL (ref 150–400)
RBC: 2.76 MIL/uL — ABNORMAL LOW (ref 3.87–5.11)
RDW: 15 % (ref 11.5–15.5)
WBC: 6.1 10*3/uL (ref 4.0–10.5)
nRBC: 0 % (ref 0.0–0.2)

## 2023-10-25 LAB — TYPE AND SCREEN
ABO/RH(D): O POS
Antibody Screen: NEGATIVE
Unit division: 0

## 2023-10-25 LAB — BASIC METABOLIC PANEL
Anion gap: 8 (ref 5–15)
BUN: 13 mg/dL (ref 8–23)
CO2: 23 mmol/L (ref 22–32)
Calcium: 8.2 mg/dL — ABNORMAL LOW (ref 8.9–10.3)
Chloride: 105 mmol/L (ref 98–111)
Creatinine, Ser: 0.53 mg/dL (ref 0.44–1.00)
GFR, Estimated: >60 mL/min (ref >60)
Glucose, Bld: 85 mg/dL (ref 70–99)
Potassium: 3.9 mmol/L (ref 3.5–5.1)
Sodium: 136 mmol/L (ref 135–145)

## 2023-10-25 LAB — GLUCOSE, CAPILLARY
Glucose-Capillary: 63 mg/dL — ABNORMAL LOW (ref 70–99)
Glucose-Capillary: 96 mg/dL (ref 70–99)
Glucose-Capillary: 100 mg/dL — ABNORMAL HIGH (ref 70–99)

## 2023-10-25 SURGERY — ESOPHAGOGASTRODUODENOSCOPY (EGD) WITH PROPOFOL
Anesthesia: Monitor Anesthesia Care

## 2023-10-25 MED ORDER — PROPOFOL 1000 MG/100ML IV EMUL
INTRAVENOUS | Status: AC
  Filled 2023-10-25: qty 100

## 2023-10-25 MED ORDER — LIDOCAINE HCL (PF) 2 % IJ SOLN
INTRAMUSCULAR | Status: DC | PRN
  Administered 2023-10-25: 40 mg via INTRADERMAL

## 2023-10-25 MED ORDER — PHENYLEPHRINE HCL (PRESSORS) 10 MG/ML IV SOLN
INTRAVENOUS | Status: DC | PRN
  Administered 2023-10-25: 160 ug via INTRAVENOUS

## 2023-10-25 MED ORDER — PROPOFOL 10 MG/ML IV BOLUS
INTRAVENOUS | Status: DC | PRN
  Administered 2023-10-25: 60 mg via INTRAVENOUS

## 2023-10-25 MED ORDER — PROPOFOL 500 MG/50ML IV EMUL
INTRAVENOUS | Status: DC | PRN
  Administered 2023-10-25: 100 ug/kg/min via INTRAVENOUS

## 2023-10-25 SURGICAL SUPPLY — 15 items

## 2023-10-25 NOTE — Anesthesia Preprocedure Evaluation (Addendum)
Anesthesia Evaluation  Patient identified by MRN, date of birth, ID band Patient awake    Reviewed: Allergy & Precautions, NPO status , Patient's Chart, lab work & pertinent test results  Airway Mallampati: II  TM Distance: >3 FB Neck ROM: Full    Dental no notable dental hx. (+) Partial Lower, Partial Upper   Pulmonary former smoker   Pulmonary exam normal breath sounds clear to auscultation       Cardiovascular hypertension, Normal cardiovascular exam+ Valvular Problems/Murmurs AS  Rhythm:Regular Rate:Normal     Neuro/Psych negative neurological ROS  negative psych ROS   GI/Hepatic ,GERD  ,,  Endo/Other    Renal/GU      Musculoskeletal negative musculoskeletal ROS (+)    Abdominal   Peds  Hematology  (+) Blood dyscrasia, anemia Fe deficiency anemia   Lab Results      Component                Value               Date                      WBC                      6.1                 10/25/2023                HGB                      8.9 (L)             10/25/2023                HCT                      27.4 (L)            10/25/2023                MCV                      99.3                10/25/2023                PLT                      280                 10/25/2023              Anesthesia Other Findings   Reproductive/Obstetrics                             Anesthesia Physical Anesthesia Plan  ASA: 3  Anesthesia Plan: MAC   Post-op Pain Management:    Induction:   PONV Risk Score and Plan: Propofol infusion and Treatment may vary due to age or medical condition  Airway Management Planned: Natural Airway and Nasal Cannula  Additional Equipment: None  Intra-op Plan:   Post-operative Plan:   Informed Consent: I have reviewed the patients History and Physical, chart, labs and discussed the procedure including the risks, benefits and alternatives for the proposed  anesthesia with the patient or authorized representative who has indicated his/her understanding  and acceptance.     Dental advisory given  Plan Discussed with: CRNA  Anesthesia Plan Comments: (GI Bleed)        Anesthesia Quick Evaluation

## 2023-10-25 NOTE — Transfer of Care (Signed)
Immediate Anesthesia Transfer of Care Note  Patient: NOVI CALIA  Procedure(s) Performed: ESOPHAGOGASTRODUODENOSCOPY (EGD) WITH PROPOFOL BIOPSY  Patient Location: Endoscopy Unit  Anesthesia Type:MAC  Level of Consciousness: drowsy and responds to stimulation  Airway & Oxygen Therapy: Patient Spontanous Breathing and Patient connected to face mask oxygen  Post-op Assessment: Report given to RN and Post -op Vital signs reviewed and stable  Post vital signs: Reviewed and stable  Last Vitals:  Vitals Value Taken Time  BP    Temp    Pulse 70 10/25/23 1438  Resp 19 10/25/23 1438  SpO2 100 % 10/25/23 1438  Vitals shown include unfiled device data.  Last Pain:  Vitals:   10/25/23 1328  TempSrc: Temporal  PainSc: 0-No pain      Patients Stated Pain Goal: 0 (10/25/23 1200)  Complications: No notable events documented.

## 2023-10-25 NOTE — Plan of Care (Signed)
  Problem: Clinical Measurements: Goal: Ability to maintain clinical measurements within normal limits will improve Outcome: Progressing Goal: Will remain free from infection Outcome: Progressing Goal: Diagnostic test results will improve Outcome: Progressing Goal: Respiratory complications will improve Outcome: Progressing Goal: Cardiovascular complication will be avoided Outcome: Progressing   Problem: Coping: Goal: Level of anxiety will decrease Outcome: Progressing   Problem: Elimination: Goal: Will not experience complications related to urinary retention Outcome: Progressing   Problem: Skin Integrity: Goal: Risk for impaired skin integrity will decrease Outcome: Progressing

## 2023-10-25 NOTE — Anesthesia Postprocedure Evaluation (Signed)
Anesthesia Post Note  Patient: Christine Morse  Procedure(s) Performed: ESOPHAGOGASTRODUODENOSCOPY (EGD) WITH PROPOFOL BIOPSY     Patient location during evaluation: Endoscopy Anesthesia Type: MAC Level of consciousness: awake and alert Pain management: pain level controlled Vital Signs Assessment: post-procedure vital signs reviewed and stable Respiratory status: spontaneous breathing, nonlabored ventilation, respiratory function stable and patient connected to nasal cannula oxygen Cardiovascular status: blood pressure returned to baseline and stable Postop Assessment: no apparent nausea or vomiting Anesthetic complications: no   No notable events documented.  Last Vitals:  Vitals:   10/25/23 1450 10/25/23 1500  BP: (!) 97/52 (!) 119/50  Pulse: 73 71  Resp: (!) 22 19  Temp:    SpO2: 97% 98%    Last Pain:  Vitals:   10/25/23 1500  TempSrc:   PainSc: 0-No pain                 Trevor Iha

## 2023-10-25 NOTE — Op Note (Signed)
[facility] Patient Name: Christine Morse Procedure Date: 10/25/2023 MRN: [id number] Attending MD: Wilhemina Bonito. Marina Goodell , MD, [id number] Date of Birth: 1936/04/27 CSN: [id number] Age: 87 Admit Type: Inpatient Procedure:                Upper GI endoscopy with biopsies Indications:              Iron deficiency anemia, Heme positive stool Providers:                Wilhemina Bonito. Marina Goodell, MD, Margaree Mackintosh, RN, Harrington Challenger, Technician Referring MD:             [facility] Medicines:                Monitored Anesthesia Care Complications:            No immediate complications. Estimated Blood Loss:     Estimated blood loss: none. Procedure:                Pre-Anesthesia Assessment:                           - Prior to the procedure, a History and Physical                            was performed, and patient medications and                            allergies were reviewed. The patient's tolerance of                            previous anesthesia was also reviewed. The risks                            and benefits of the procedure and the sedation                            options and risks were discussed with the patient.                            All questions were answered, and informed consent                            was obtained. Prior Anticoagulants: The patient has                            taken no anticoagulant or antiplatelet agents. ASA                            Grade Assessment: III - A patient with severe                            systemic disease. After reviewing the risks and  benefits, the patient was deemed in satisfactory                            condition to undergo the procedure.                           After obtaining informed consent, the endoscope was                            passed under direct vision. Throughout the                            procedure, the patient's blood pressure,  pulse, and                            oxygen saturations were monitored continuously. The                            [id number]-[id number] ([id number]) Olympus endoscope was introduced                            through the mouth, and advanced to the third part                            of duodenum. The upper GI endoscopy was                            accomplished without difficulty. The patient                            tolerated the procedure well. Scope In: Scope Out: Findings:      The esophagus was normal.      The stomach revealed a large hiatal hernia but was otherwise normal.      The examined duodenum revealed a large concentric mass in the second       portion. Large areas were soft and consistent with villous adenoma.       Other areas were firm superficial ulceration concerning for cancer. This       was nonobstructing. Normal duodenum beyond the lesion. Biopsies were       taken with a cold forceps for histology.      The cardia and gastric fundus were normal on retroflexion. Impression:               1. 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 Likely malignant. This is the cause                            for GI blood loss.                           2. Large hiatal hernia without erosions  3. Otherwise unremarkable EGD. Moderate Sedation:      none Recommendation:           - Patient has a contact number available for                            emergencies. The signs and symptoms of potential                            delayed complications were discussed with the                            patient. Return to normal activities tomorrow.                            Written discharge instructions were provided to the                            patient.                           - Resume previous diet.                           - Continue present medications.                           - Contrast-enhanced CT scan  of the abdomen and                            pelvis "duodenal mass"                           - Await pathology results. Consult oncology if                            biopsies return showing malignancy                           Discussed with patient. She was provided a copy of                            this report. We will follow. Procedure Code(s):        --- Professional ---                           [id number], Esophagogastroduodenoscopy, flexible,                            transoral; with biopsy, single or multiple Diagnosis Code(s):        --- Professional ---                           D50.9, Iron deficiency anemia, unspecified                           R19.5, Other fecal abnormalities CPT copyright 2022 American  Medical Association. All rights reserved. The codes documented in this report are preliminary and upon coder review may  be revised to meet current compliance requirements. Wilhemina Bonito. Marina Goodell, MD 10/25/2023 2:39:05 PM This report has been signed electronically. Number of Addenda: 0

## 2023-10-25 NOTE — Progress Notes (Addendum)
[facility] Progress Note  CC:  Iron deficiency anemia, heme positive stool   Subjective: She remains n.p.o. for EGD this afternoon.  She denies having any chest pain or shortness of breath.   No abdominal pain. Last bowel movement was yesterday midday, no bloody or black stools as reported by her RN.  She had mild confusion last night which resolved this morning.  She sat up in the chair for 45 minutes earlier this morning.  No family at the bedside.   Objective:  Vital signs in last 24 hours: Temp:  [97.9 F (36.6 C)-98.8 F (37.1 C)] 98.3 F (36.8 C) (11/04 0800) Pulse Rate:  [70-86] 86 (11/04 1000) Resp:  [14-25] 17 (11/04 1000) BP: (99-129)/(41-75) 129/53 (11/04 0800) SpO2:  [96 %-100 %] 99 % (11/04 1000) Weight:  [54.1 kg] 54.1 kg (11/04 0349) Last BM Date : 10/24/23 General: 87 year old female in no acute distress. Heart: Regular rate and rhythm, 3/6 systolic murmur (patient stated she has had a heart murmur since childhood). Pulm: Breath sounds clear throughout. Abdomen: Soft, nondistended.  Nontender.  Positive bowel sounds all 4 quadrants.  No bruit.  No palpable mass. Extremities: No lower extremity edema. Neurologic:  Alert and  oriented x 4.  Speech is clear.  Moves all extremities equally. Psych:  Alert and cooperative. Normal mood and affect.  Intake/Output from previous day: 11/03 0701 - 11/04 0700 In: 843.2 [I.V.:121.4; Blood:344; IV Piggyback:377.8] Out: -  Intake/Output this shift: No intake/output data recorded.  Lab Results: Recent Labs    10/24/23 0325 10/24/23 1051 10/24/23 1657 10/25/23 0525  WBC 5.8  --  7.7 6.1  HGB 7.5* 8.4* 8.7* 8.9*  HCT 22.7* 26.1* 25.9* 27.4*  PLT 266  --  254 280   BMET Recent Labs    10/23/23 2138 10/24/23 0325  NA 132* 136  K 3.5 3.3*  CL 99 108  CO2 25 25  GLUCOSE 108* 98  BUN 23 19  CREATININE 0.46 0.41*  CALCIUM 9.0 7.6*   LFT Recent Labs    10/23/23 2138  PROT 6.3*  ALBUMIN 3.8   AST 20  ALT 13  ALKPHOS 57  BILITOT 0.8   PT/INR No results for input(s): "LABPROT", "INR" in the last 72 hours. Hepatitis Panel No results for input(s): "HEPBSAG", "HCVAB", "HEPAIGM", "HEPBIGM" in the last 72 hours.  ECHOCARDIOGRAM COMPLETE  Result Date: 10/24/2023    ECHOCARDIOGRAM REPORT   Patient Name:   RAMIYA DELAHUNTY Date of Exam: 10/24/2023 Medical Rec #:  [id number]      Height:       57.0 in Accession #:    [id number]     Weight:       119.3 lb Date of Birth:  Apr 19, 1936     BSA:          1.444 m Patient Age:    86 years       BP:           108/46 mmHg Patient Gender: F              HR:           73 bpm. Exam Location:  Inpatient Procedure: 2D Echo, Color Doppler and Cardiac Doppler Indications:    Aortic Valve Disorder I35.9  History:        Patient has prior history of Echocardiogram examinations, most                 recent 08/13/2022. Aortic  Valve Disease; Risk                 Factors:Hypertension and Dyslipidemia.  Sonographer:    Harriette Bouillon RDCS Referring Phys: [id number] CAROLE N HALL IMPRESSIONS  1. Proximal septal thickening with SAM and narrow LVOT; turbulence noted with peak velocity 3.3 m/s (obstructive physiology). Previous study from 08/13/22 reviewed and appears similar (peak LVOT gradient 5.3 m/s; obstruction appears to be at LVOT level and not aortic stenosis as reported).  2. Left ventricular ejection fraction, by estimation, is 60 to 65%. The left ventricle has normal function. The left ventricle has no regional wall motion abnormalities. There is mild concentric left ventricular hypertrophy. Left ventricular diastolic parameters are consistent with Grade I diastolic dysfunction (impaired relaxation). Elevated left atrial pressure.  3. Right ventricular systolic function is normal. The right ventricular size is normal. There is mildly elevated pulmonary artery systolic pressure.  4. Left atrial size was severely dilated.  5. The mitral valve is normal in structure. Moderate  mitral valve regurgitation. No evidence of mitral stenosis.  6. The aortic valve is calcified. Aortic valve regurgitation is trivial. No aortic stenosis is present.  7. The inferior vena cava is normal in size with greater than 50% respiratory variability, suggesting right atrial pressure of 3 mmHg. FINDINGS  Left Ventricle: Left ventricular ejection fraction, by estimation, is 60 to 65%. The left ventricle has normal function. The left ventricle has no regional wall motion abnormalities. The left ventricular internal cavity size was normal in size. There is  mild concentric left ventricular hypertrophy. Left ventricular diastolic parameters are consistent with Grade I diastolic dysfunction (impaired relaxation). Elevated left atrial pressure. Right Ventricle: The right ventricular size is normal. Right ventricular systolic function is normal. There is mildly elevated pulmonary artery systolic pressure. The tricuspid regurgitant velocity is 3.05 m/s, and with an assumed right atrial pressure of 3 mmHg, the estimated right ventricular systolic pressure is 40.2 mmHg. Left Atrium: Left atrial size was severely dilated. Right Atrium: Right atrial size was normal in size. Pericardium: There is no evidence of pericardial effusion. Mitral Valve: The mitral valve is normal in structure. Mild mitral annular calcification. Moderate mitral valve regurgitation. No evidence of mitral valve stenosis. Tricuspid Valve: The tricuspid valve is normal in structure. Tricuspid valve regurgitation is mild . No evidence of tricuspid stenosis. Aortic Valve: The aortic valve is calcified. Aortic valve regurgitation is trivial. No aortic stenosis is present. Aortic valve mean gradient measures 20.0 mmHg. Aortic valve peak gradient measures 33.8 mmHg. Pulmonic Valve: The pulmonic valve was not well visualized. Pulmonic valve regurgitation is not visualized. No evidence of pulmonic stenosis. Aorta: The aortic root is normal in size and  structure. Venous: The inferior vena cava is normal in size with greater than 50% respiratory variability, suggesting right atrial pressure of 3 mmHg. IAS/Shunts: No atrial level shunt detected by color flow Doppler. Additional Comments: Proximal septal thickening with SAM and narrow LVOT; turbulence noted with peak velocity 3.3 m/s (obstructive physiology). Previous study from 08/13/22 reviewed and appears similar (peak LVOT gradient 5.3 m/s; obstruction appears to be at LVOT level and not aortic stenosis as reported).  LEFT VENTRICLE PLAX 2D LVIDd:         3.70 cm   Diastology LVIDs:         2.70 cm   LV e' medial:    5.11 cm/s LV PW:         1.30 cm   LV E/e' medial:  28.8  LV IVS:        1.20 cm   LV e' lateral:   4.57 cm/s LVOT diam:     1.60 cm   LV E/e' lateral: 32.2 LVOT Area:     2.01 cm  RIGHT VENTRICLE             IVC RV S prime:     13.10 cm/s  IVC diam: 1.60 cm TAPSE (M-mode): 2.2 cm LEFT ATRIUM              Index        RIGHT ATRIUM           Index LA diam:        3.80 cm  2.63 cm/m   RA Area:     14.30 cm LA Vol (A2C):   102.0 ml 70.64 ml/m  RA Volume:   35.50 ml  24.59 ml/m LA Vol (A4C):   69.2 ml  47.93 ml/m LA Biplane Vol: 83.9 ml  58.11 ml/m  AORTIC VALVE AV Vmax:      290.50 cm/s AV Vmean:     211.500 cm/s AV VTI:       0.744 m AV Peak Grad: 33.8 mmHg AV Mean Grad: 20.0 mmHg  AORTA Ao Root diam: 3.30 cm MITRAL VALVE                 TRICUSPID VALVE MV Area (PHT): 3.45 cm      TR Peak grad:   37.2 mmHg MV Decel Time: 220 msec      TR Vmax:        305.00 cm/s MR Peak grad:   172.1 mmHg MR Mean grad:   113.0 mmHg   SHUNTS MR Vmax:        656.00 cm/s  Systemic Diam: 1.60 cm MR Vmean:       512.0 cm/s MR PISA:        4.02 cm MR PISA Radius: 0.80 cm MV E velocity: 147.00 cm/s MV A velocity: 147.00 cm/s MV E/A ratio:  1.00 Olga Millers MD Electronically signed by Olga Millers MD Signature Date/Time: 10/24/2023/11:34:46 AM    Final    CT Angio Chest Pulmonary Embolism (PE) W or WO  Contrast  Result Date: 10/24/2023 CLINICAL DATA:  Pulmonary embolism (PE) suspected, high prob EXAM: CT ANGIOGRAPHY CHEST WITH CONTRAST TECHNIQUE: Multidetector CT imaging of the chest was performed using the standard protocol during bolus administration of intravenous contrast. Multiplanar CT image reconstructions and MIPs were obtained to evaluate the vascular anatomy. RADIATION DOSE REDUCTION: This exam was performed according to the departmental dose-optimization program which includes automated exposure control, adjustment of the mA and/or kV according to patient size and/or use of iterative reconstruction technique. CONTRAST:  80mL OMNIPAQUE IOHEXOL 350 MG/ML SOLN COMPARISON:  None Available. FINDINGS: Cardiovascular: No filling defects in the pulmonary arteries to suggest pulmonary emboli. Heart is normal size. Aorta is normal caliber. Coronary artery and aortic atherosclerosis. Mediastinum/Nodes: Left axillary enlarged lymph node measuring 12 mm in short axis diameter on image 47. No mediastinal or hilar adenopathy. Trachea and esophagus are unremarkable. Moderate-sized hiatal hernia. Left thyroid nodule measures 12 mm. Not clinically significant; no follow-up imaging recommended (ref: J Am Coll Radiol. 2015 Feb;12(2): 143-50). Lungs/Pleura: Lungs are clear. No focal airspace opacities or suspicious nodules. No effusions. Upper Abdomen: No acute findings Musculoskeletal: Chest wall soft tissues are unremarkable. No acute bony abnormality. Review of the MIP images confirms the above findings. IMPRESSION: No evidence of pulmonary embolus.  No acute cardiopulmonary disease. Moderate sized hiatal hernia. Coronary artery disease. Aortic Atherosclerosis (ICD10-I70.0). Electronically Signed   By: Charlett Nose M.D.   On: 10/24/2023 00:26   DG Chest 2 View  Result Date: 10/23/2023 CLINICAL DATA:  Shortness of breath EXAM: CHEST - 2 VIEW COMPARISON:  09/19/2017 FINDINGS: Mild central left upper lobe opacity,  raising the possibility of early pneumonia. Right lung is clear. No pleural effusion or pneumothorax. The heart is normal in size.  Thoracic aortic atherosclerosis. Visualized osseous structures are within normal limits. IMPRESSION: Mild central left upper lobe opacity, raising the possibility of early pneumonia. Electronically Signed   By: Charline Bills M.D.   On: 10/23/2023 23:27    Brief Narrative:  CATALINA SALASAR is a 87 y.o. 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  Upon initial evaluation, she was found to have anemia with hemoglobin of 9.3 which dropped to 7.5. Occult blood positive.    Assessment / Plan:  87 year old female admitted to the hospital with anemia and melena x 2 months.  Positive FOBT. Admission Hg 9.3 ( Hg 10.7 one month ago) -> 7.5 on 11/3 -> transfused one unit of PRBCs -> 8.4 -> 8.7-> today Hg 8.9. Iron 38. Ferritin 15. History of anemia in the past with GI workup in 2013 including EGD, colonoscopy and capsule endoscopy showing 5 cm large hiatal hernia with Cameron's erosions which thought to be the cause for patient's anemia.  Hemodynamically stable. -NPO  -Proceed with EGD today as scheduled with Dr. Marina Goodell,  benefits and risks discussed including risk with sedation, risk of bleeding, perforation and infection  -Continue PPI IV twice daily  Hypokalemia. K+ 3.3 on 11/3, received KCl IV. -Stat BMP  Hyponatremia. Na+ 132.   SOB. Elevated BNP 1774. Troponin  17 -> 21 -> 41.  X-ray showed mild central left upper lobe opacity suggestive of possible early pneumonia. CTA was negative for PE with evidence of coronary artery and aortic atherosclerosis and a moderate size hiatal hernia. ECHO showed LV EF 60 - 65%. Normal RV function. Moderated MR. Trivial AR. No AS. On room air.      Principal Problem:   Symptomatic anemia     LOS: 1  day   Arnaldo Natal  10/25/2023, 11:37AM  GI ATTENDING  Interval history data reviewed.  Patient seen and examined.  Agree with interval progress note as outlined above.  Patient is now for upper endoscopy to evaluate acute on chronic anemia and Hemoccult positive stool.  She is high risk given her age and comorbidities.The nature of the procedure, as well as the risks, benefits, and alternatives were carefully and thoroughly reviewed with the patient. Ample time for discussion and questions allowed. The patient understood, was satisfied, and agreed to proceed.  Wilhemina Bonito. Eda Keys., M.D. [facility] Division of Gastroenterology

## 2023-10-26 ENCOUNTER — Inpatient Hospital Stay (HOSPITAL_COMMUNITY): Payer: Medicare Other

## 2023-10-26 DIAGNOSIS — D509 Iron deficiency anemia, unspecified: Secondary | ICD-10-CM | POA: Diagnosis not present

## 2023-10-26 DIAGNOSIS — R195 Other fecal abnormalities: Secondary | ICD-10-CM

## 2023-10-26 DIAGNOSIS — C170 Malignant neoplasm of duodenum: Secondary | ICD-10-CM

## 2023-10-26 DIAGNOSIS — D649 Anemia, unspecified: Secondary | ICD-10-CM | POA: Diagnosis not present

## 2023-10-26 LAB — CBC
HCT: 27.1 % — ABNORMAL LOW (ref 36.0–46.0)
Hemoglobin: 9 g/dL — ABNORMAL LOW (ref 12.0–15.0)
MCH: 32.8 pg (ref 26.0–34.0)
MCHC: 33.2 g/dL (ref 30.0–36.0)
MCV: 98.9 fL (ref 80.0–100.0)
Platelets: 290 10*3/uL (ref 150–400)
RBC: 2.74 MIL/uL — ABNORMAL LOW (ref 3.87–5.11)
RDW: 14.8 % (ref 11.5–15.5)
WBC: 5.1 10*3/uL (ref 4.0–10.5)
nRBC: 0 % (ref 0.0–0.2)

## 2023-10-26 LAB — GLUCOSE, CAPILLARY
Glucose-Capillary: 84 mg/dL (ref 70–99)
Glucose-Capillary: 78 mg/dL (ref 70–99)
Glucose-Capillary: 81 mg/dL (ref 70–99)
Glucose-Capillary: 118 mg/dL — ABNORMAL HIGH (ref 70–99)

## 2023-10-26 LAB — BASIC METABOLIC PANEL
Anion gap: 8 (ref 5–15)
BUN: 12 mg/dL (ref 8–23)
CO2: 23 mmol/L (ref 22–32)
Calcium: 8.2 mg/dL — ABNORMAL LOW (ref 8.9–10.3)
Chloride: 107 mmol/L (ref 98–111)
Creatinine, Ser: 0.47 mg/dL (ref 0.44–1.00)
GFR, Estimated: >60 mL/min (ref >60)
Glucose, Bld: 81 mg/dL (ref 70–99)
Potassium: 3.7 mmol/L (ref 3.5–5.1)
Sodium: 138 mmol/L (ref 135–145)

## 2023-10-26 LAB — SURGICAL PATHOLOGY

## 2023-10-26 MED ORDER — IOHEXOL 9 MG/ML PO SOLN
500.0000 mL | ORAL | Status: AC
  Administered 2023-10-26 (×2): 500 mL via ORAL

## 2023-10-26 MED ORDER — IOHEXOL 300 MG/ML  SOLN
100.0000 mL | Freq: Once | INTRAMUSCULAR | Status: AC | PRN
  Administered 2023-10-26: 100 mL via INTRAVENOUS

## 2023-10-26 MED ORDER — FERROUS SULFATE 325 (65 FE) MG PO TABS
325.0000 mg | ORAL_TABLET | Freq: Every day | ORAL | Status: DC
  Administered 2023-10-26 – 2023-10-27 (×2): 325 mg via ORAL
  Filled 2023-10-26 (×2): qty 1

## 2023-10-26 MED ORDER — IOHEXOL 9 MG/ML PO SOLN
ORAL | Status: AC
  Filled 2023-10-26: qty 1000

## 2023-10-26 MED ORDER — ROSUVASTATIN CALCIUM 5 MG PO TABS
5.0000 mg | ORAL_TABLET | Freq: Every day | ORAL | Status: DC
  Administered 2023-10-26 – 2023-10-27 (×2): 5 mg via ORAL
  Filled 2023-10-26 (×2): qty 1

## 2023-10-26 NOTE — Progress Notes (Signed)
PROGRESS NOTE    Christine Morse  OZD:[id number] DOB: 13-Sep-1936 DOA: 10/23/2023 PCP: [name], MD  Late entry note. Brief Narrative:  HPI: Christine Morse is a 87 y.o. female with medical history significant for prior upper GI bleed in the setting of large 5 cm nonreducible hiatal hernia with superficial Cameron erosions seen on EGD done in 2014, valvular heart disease, nonrheumatic aortic valve stenosis, chronic HFpEF, hypertension, hyperlipidemia, iron deficiency anemia, who presents to the ED from home due to worsening dyspnea with minimal exertion x 3 days.  Associated with chest tightness intermittently.  No palpitations.  No recent use of NSAIDs.  No abdominal pain.  Denies any overt bleeding.   In the ED, noted to have soft BPs with tachycardia upon standing.  Lab studies notable for acute blood loss anemia with hemoglobin of 9.3 and positive FOBT.  The patient received IV fluid bolus NS 1 L x 1.  TRH, hospitalist service, was asked to admit.   Serial H&H ordered.  Repeat hemoglobin 7.5 from 9.3.  1 unit PRBCs ordered.  Started IV PPI twice daily.  [facility], Dr. Lorenso Quarry consulted for possible EGD.  Another bolus of 1 L IV fluid NS given for soft BPs.   ED Course: Temperature 98.1.  BP 95/57, pulse 78, respiratory rate 13, O2 saturation 97% on room air.  Lab studies notable for WBC 10.7, hemoglobin 9.3, platelet count 4 9.  Troponin 17, 21.  Serum sodium 132.  Assessment & Plan:   Principal Problem:   Symptomatic anemia Active Problems:   Duodenal mass  History of large 5 cm nonreducible hiatal hernia with superficial Cameron erosions seen on EGD done in 2014 admitted with symptomatic anemia secondary to upper GI bleed due to duodenal mass: Presented with hemoglobin of 9.3 from baseline of 12 in 2018 Repeat hemoglobin 7.5 from 9.3, given 1 unit of PRBC transfusion, started on IV PPI twice daily, posttransfusion hemoglobin improved.  GI on board, status post EGD 10/25/2023,  was found to have large duodenal mass and second portion, biopsies taken, results pending.  CT abdomen chest and pelvis ordered.  Will need oncology consult if this turns out to be cancer.  Continue PPI.   Elevated troponin, suspect demand ischemia in the setting of acute blood loss anemia/ Intermittent chest tightness Troponin 17>21> 36> 41.  Patient has not complained of any chest pain.  No acute ST-T wave changes in EKG and no wall motion abnormality on echo, likely demand ischemia.   Nonrheumatic mild to moderate aortic stenosis, 2D echo 08/13/2022 Echo shows calcified aortic valve with regurgitation which is trivial, no aortic stenosis is present.   HFpEF Appears euvolemic.   Hypertension Blood pressure soft at times and very labile.  Continue to hold antihypertensives.   Hyperlipidemia Resume Crestor.   Iron deficiency anemia Resume home iron    DVT prophylaxis: SCDs Start: 10/24/23 0232   Code Status: Full Code  Family Communication:  None present at bedside.  Plan of care discussed with patient in length and he/she verbalized understanding and agreed with it.  Status is: Inpatient Remains inpatient appropriate because: Awaiting biopsy results.   Estimated body mass index is 26 kg/m as calculated from the following:   Height as of this encounter: 4\' 9"  (1.448 m).   Weight as of this encounter: 54.5 kg.    Nutritional Assessment: Body mass index is 26 kg/m.Marland Kitchen Seen by dietician.  I agree with the assessment and plan as outlined below: Nutrition  Status:        . Skin Assessment: I have examined the patient's skin and I agree with the wound assessment as performed by the wound care RN as outlined below:    Consultants:  GI  Procedures:  As above  Antimicrobials:  Anti-infectives (From admission, onward)    None         Subjective: Patient seen and examined.  She has no complaints.  Objective: Vitals:   10/26/23 0600 10/26/23 0700 10/26/23 0748  10/26/23 0800  BP: (!) 97/33 (!) 126/48  (!) 143/55  Pulse: 61 (!) 57  71  Resp: 15 (!) 4  (!) 21  Temp:   97.9 F (36.6 C)   TempSrc:   Oral   SpO2: 97% 97%  100%  Weight:      Height:        Intake/Output Summary (Last 24 hours) at 10/26/2023 1040 Last data filed at 10/26/2023 0300 Gross per 24 hour  Intake 480 ml  Output 0 ml  Net 480 ml   Filed Weights   10/25/23 0349 10/25/23 1328 10/26/23 0402  Weight: 54.1 kg 54.1 kg 54.5 kg    Examination:  General exam: Appears calm and comfortable  Respiratory system: Clear to auscultation. Respiratory effort normal. Cardiovascular system: S1 & S2 heard, RRR. No JVD, murmurs, rubs, gallops or clicks. No pedal edema. Gastrointestinal system: Abdomen is nondistended, soft and nontender. No organomegaly or masses felt. Normal bowel sounds heard. Central nervous system: Alert and oriented. No focal neurological deficits. Extremities: Symmetric 5 x 5 power. Skin: No rashes, lesions or ulcers.  Psychiatry: Judgement and insight appear normal. Mood & affect appropriate.    Data Reviewed: I have personally reviewed following labs and imaging studies  CBC: Recent Labs  Lab 10/23/23 2138 10/24/23 0325 10/24/23 1051 10/24/23 1657 10/25/23 0525 10/26/23 0547  WBC 10.7* 5.8  --  7.7 6.1 5.1  NEUTROABS 9.1*  --   --  5.6 4.4  --   HGB 9.3* 7.5* 8.4* 8.7* 8.9* 9.0*  HCT 28.2* 22.7* 26.1* 25.9* 27.4* 27.1*  MCV 98.6 98.3  --  96.6 99.3 98.9  PLT 409* 266  --  254 280 290   Basic Metabolic Panel: Recent Labs  Lab 10/23/23 2138 10/24/23 0325 10/25/23 1140 10/26/23 0547  NA 132* 136 136 138  K 3.5 3.3* 3.9 3.7  CL 99 108 105 107  CO2 25 25 23 23   GLUCOSE 108* 98 85 81  BUN 23 19 13 12   CREATININE 0.46 0.41* 0.53 0.47  CALCIUM 9.0 7.6* 8.2* 8.2*  MG  --  1.9  --   --   PHOS  --  3.8  --   --    GFR: Estimated Creatinine Clearance: 37.2 mL/min (by C-G formula based on SCr of 0.47 mg/dL). Liver Function Tests: Recent Labs   Lab 10/23/23 2138  AST 20  ALT 13  ALKPHOS 57  BILITOT 0.8  PROT 6.3*  ALBUMIN 3.8   No results for input(s): "LIPASE", "AMYLASE" in the last 168 hours. No results for input(s): "AMMONIA" in the last 168 hours. Coagulation Profile: No results for input(s): "INR", "PROTIME" in the last 168 hours. Cardiac Enzymes: No results for input(s): "CKTOTAL", "CKMB", "CKMBINDEX", "TROPONINI" in the last 168 hours. BNP (last 3 results) No results for input(s): "PROBNP" in the last 8760 hours. HbA1C: No results for input(s): "HGBA1C" in the last 72 hours. CBG: Recent Labs  Lab 10/25/23 1600 10/25/23 1625 10/25/23 2121 10/26/23 0740  GLUCAP 63* 96 100* 84   Lipid Profile: No results for input(s): "CHOL", "HDL", "LDLCALC", "TRIG", "CHOLHDL", "LDLDIRECT" in the last 72 hours. Thyroid Function Tests: No results for input(s): "TSH", "T4TOTAL", "FREET4", "T3FREE", "THYROIDAB" in the last 72 hours. Anemia Panel: Recent Labs    10/24/23 1051  FERRITIN 15  TIBC 241*  IRON 38   Sepsis Labs: No results for input(s): "PROCALCITON", "LATICACIDVEN" in the last 168 hours.  Recent Results (from the past 240 hour(s))  MRSA Next Gen by PCR, Nasal     Status: None   Collection Time: 10/24/23  5:25 AM   Specimen: Nasal Mucosa; Nasal Swab  Result Value Ref Range Status   MRSA by PCR Next Gen NOT DETECTED NOT DETECTED Final    Comment: (NOTE) The GeneXpert MRSA Assay (FDA approved for NASAL specimens only), is one component of a comprehensive MRSA colonization surveillance program. It is not intended to diagnose MRSA infection nor to guide or monitor treatment for MRSA infections. Test performance is not FDA approved in patients less than 7 years old. Performed at [facility], [address] [place], [place] [address]      Radiology Studies: ECHOCARDIOGRAM COMPLETE  Result Date: 10/24/2023    ECHOCARDIOGRAM REPORT   Patient Name:   CARELI LUZADER Date of Exam:  10/24/2023 Medical Rec #:  [id number]      Height:       57.0 in Accession #:    [id number]     Weight:       119.3 lb Date of Birth:  Feb 22, 1936     BSA:          1.444 m Patient Age:    86 years       BP:           108/46 mmHg Patient Gender: F              HR:           73 bpm. Exam Location:  Inpatient Procedure: 2D Echo, Color Doppler and Cardiac Doppler Indications:    Aortic Valve Disorder I35.9  History:        Patient has prior history of Echocardiogram examinations, most                 recent 08/13/2022. Aortic Valve Disease; Risk                 Factors:Hypertension and Dyslipidemia.  Sonographer:    Harriette Bouillon RDCS Referring Phys: [id number] CAROLE N HALL IMPRESSIONS  1. Proximal septal thickening with SAM and narrow LVOT; turbulence noted with peak velocity 3.3 m/s (obstructive physiology). Previous study from 08/13/22 reviewed and appears similar (peak LVOT gradient 5.3 m/s; obstruction appears to be at LVOT level and not aortic stenosis as reported).  2. Left ventricular ejection fraction, by estimation, is 60 to 65%. The left ventricle has normal function. The left ventricle has no regional wall motion abnormalities. There is mild concentric left ventricular hypertrophy. Left ventricular diastolic parameters are consistent with Grade I diastolic dysfunction (impaired relaxation). Elevated left atrial pressure.  3. Right ventricular systolic function is normal. The right ventricular size is normal. There is mildly elevated pulmonary artery systolic pressure.  4. Left atrial size was severely dilated.  5. The mitral valve is normal in structure. Moderate mitral valve regurgitation. No evidence of mitral stenosis.  6. The aortic valve is calcified. Aortic valve regurgitation is trivial. No aortic stenosis is present.  7. The inferior  vena cava is normal in size with greater than 50% respiratory variability, suggesting right atrial pressure of 3 mmHg. FINDINGS  Left Ventricle: Left ventricular ejection  fraction, by estimation, is 60 to 65%. The left ventricle has normal function. The left ventricle has no regional wall motion abnormalities. The left ventricular internal cavity size was normal in size. There is  mild concentric left ventricular hypertrophy. Left ventricular diastolic parameters are consistent with Grade I diastolic dysfunction (impaired relaxation). Elevated left atrial pressure. Right Ventricle: The right ventricular size is normal. Right ventricular systolic function is normal. There is mildly elevated pulmonary artery systolic pressure. The tricuspid regurgitant velocity is 3.05 m/s, and with an assumed right atrial pressure of 3 mmHg, the estimated right ventricular systolic pressure is 40.2 mmHg. Left Atrium: Left atrial size was severely dilated. Right Atrium: Right atrial size was normal in size. Pericardium: There is no evidence of pericardial effusion. Mitral Valve: The mitral valve is normal in structure. Mild mitral annular calcification. Moderate mitral valve regurgitation. No evidence of mitral valve stenosis. Tricuspid Valve: The tricuspid valve is normal in structure. Tricuspid valve regurgitation is mild . No evidence of tricuspid stenosis. Aortic Valve: The aortic valve is calcified. Aortic valve regurgitation is trivial. No aortic stenosis is present. Aortic valve mean gradient measures 20.0 mmHg. Aortic valve peak gradient measures 33.8 mmHg. Pulmonic Valve: The pulmonic valve was not well visualized. Pulmonic valve regurgitation is not visualized. No evidence of pulmonic stenosis. Aorta: The aortic root is normal in size and structure. Venous: The inferior vena cava is normal in size with greater than 50% respiratory variability, suggesting right atrial pressure of 3 mmHg. IAS/Shunts: No atrial level shunt detected by color flow Doppler. Additional Comments: Proximal septal thickening with SAM and narrow LVOT; turbulence noted with peak velocity 3.3 m/s (obstructive physiology).  Previous study from 08/13/22 reviewed and appears similar (peak LVOT gradient 5.3 m/s; obstruction appears to be at LVOT level and not aortic stenosis as reported).  LEFT VENTRICLE PLAX 2D LVIDd:         3.70 cm   Diastology LVIDs:         2.70 cm   LV e' medial:    5.11 cm/s LV PW:         1.30 cm   LV E/e' medial:  28.8 LV IVS:        1.20 cm   LV e' lateral:   4.57 cm/s LVOT diam:     1.60 cm   LV E/e' lateral: 32.2 LVOT Area:     2.01 cm  RIGHT VENTRICLE             IVC RV S prime:     13.10 cm/s  IVC diam: 1.60 cm TAPSE (M-mode): 2.2 cm LEFT ATRIUM              Index        RIGHT ATRIUM           Index LA diam:        3.80 cm  2.63 cm/m   RA Area:     14.30 cm LA Vol (A2C):   102.0 ml 70.64 ml/m  RA Volume:   35.50 ml  24.59 ml/m LA Vol (A4C):   69.2 ml  47.93 ml/m LA Biplane Vol: 83.9 ml  58.11 ml/m  AORTIC VALVE AV Vmax:      290.50 cm/s AV Vmean:     211.500 cm/s AV VTI:       0.744  m AV Peak Grad: 33.8 mmHg AV Mean Grad: 20.0 mmHg  AORTA Ao Root diam: 3.30 cm MITRAL VALVE                 TRICUSPID VALVE MV Area (PHT): 3.45 cm      TR Peak grad:   37.2 mmHg MV Decel Time: 220 msec      TR Vmax:        305.00 cm/s MR Peak grad:   172.1 mmHg MR Mean grad:   113.0 mmHg   SHUNTS MR Vmax:        656.00 cm/s  Systemic Diam: 1.60 cm MR Vmean:       512.0 cm/s MR PISA:        4.02 cm MR PISA Radius: 0.80 cm MV E velocity: 147.00 cm/s MV A velocity: 147.00 cm/s MV E/A ratio:  1.00 Olga Millers MD Electronically signed by Olga Millers MD Signature Date/Time: 10/24/2023/11:34:46 AM    Final     Scheduled Meds:  Chlorhexidine Gluconate Cloth  6 each Topical Daily   ferrous sulfate  325 mg Oral Q breakfast   pantoprazole (PROTONIX) IV  40 mg Intravenous BID   rosuvastatin  5 mg Oral Daily   Continuous Infusions:   LOS: 2 days   Hughie Closs, MD [facility]  10/26/2023, 10:40 AM   *Please note that this is a verbal dictation therefore any spelling or grammatical errors are due to the  "Dragon [employer] One" system interpretation.  Please page via Amion and do not message via secure chat for urgent patient care matters. Secure chat can be used for non urgent patient care matters.  How to contact the [facility] Attending or Consulting provider 7A - 7P or covering provider during after hours 7P -7A, for this patient?  Check the care team in [facility] and look for a) attending/consulting TR[facility] provider listed and b) the [facility] team listed. Page or secure chat 7A-7P. Log into [email or web address] and use [place]'s universal password to access. If you do not have the password, please contact the hospital operator. Locate the [facility] provider you are looking for under [facility] and page to a number that you can be directly reached. If you still have difficulty reaching the provider, please page the [facility] (Director on Call) for the Hospitalists listed on amion for assistance.

## 2023-10-26 NOTE — TOC Progression Note (Signed)
Transition of Care [facility]) - Progression Note    Patient Details  Name: Christine Morse MRN: [id number] Date of Birth: 28-May-1936  Transition of [facility]) CM/SW Contact  Geni Bers, RN Phone Number: 10/26/2023, 1:44 PM  Clinical Narrative:     TOC will continue to follow for discharge needs.   Expected Discharge Plan: Home w Home Health Services Barriers to Discharge: No Barriers Identified  Expected Discharge Plan and Services       Living arrangements for the past 2 months: Single Family Home                                       Social Determinants of Health (SDOH) Interventions SDOH Screenings   Food Insecurity: No Food Insecurity (10/24/2023)  Housing: Low Risk  (10/24/2023)  Transportation Needs: No Transportation Needs (10/24/2023)  Utilities: Not At Risk (10/24/2023)  Tobacco Use: Medium Risk (10/25/2023)    Readmission Risk Interventions     No data to display

## 2023-10-26 NOTE — Plan of Care (Signed)
  Problem: Education: Goal: Knowledge of General Education information will improve Description: Including pain rating scale, medication(s)/side effects and non-pharmacologic comfort measures Outcome: Progressing   Problem: Clinical Measurements: Goal: Will remain free from infection Outcome: Progressing Goal: Diagnostic test results will improve Outcome: Progressing Goal: Respiratory complications will improve Outcome: Progressing Goal: Cardiovascular complication will be avoided Outcome: Progressing   Problem: Nutrition: Goal: Adequate nutrition will be maintained Outcome: Progressing   Problem: Elimination: Goal: Will not experience complications related to bowel motility Outcome: Progressing   Problem: Pain Management: Goal: General experience of comfort will improve Outcome: Progressing   Problem: Safety: Goal: Ability to remain free from injury will improve Outcome: Progressing

## 2023-10-26 NOTE — Progress Notes (Signed)
PROGRESS NOTE    Christine Morse  WUJ:[id number] DOB: 1936/02/11 DOA: 10/23/2023 PCP: [name], MD  Late entry note. Brief Narrative:  HPI: Christine Morse is a 87 y.o. female with medical history significant for prior upper GI bleed in the setting of large 5 cm nonreducible hiatal hernia with superficial Cameron erosions seen on EGD done in 2014, valvular heart disease, nonrheumatic aortic valve stenosis, chronic HFpEF, hypertension, hyperlipidemia, iron deficiency anemia, who presents to the ED from home due to worsening dyspnea with minimal exertion x 3 days.  Associated with chest tightness intermittently.  No palpitations.  No recent use of NSAIDs.  No abdominal pain.  Denies any overt bleeding.   In the ED, noted to have soft BPs with tachycardia upon standing.  Lab studies notable for acute blood loss anemia with hemoglobin of 9.3 and positive FOBT.  The patient received IV fluid bolus NS 1 L x 1.  TRH, hospitalist service, was asked to admit.   Serial H&H ordered.  Repeat hemoglobin 7.5 from 9.3.  1 unit PRBCs ordered.  Started IV PPI twice daily.  [facility], Dr. Lorenso Quarry consulted for possible EGD.  Another bolus of 1 L IV fluid NS given for soft BPs.   ED Course: Temperature 98.1.  BP 95/57, pulse 78, respiratory rate 13, O2 saturation 97% on room air.  Lab studies notable for WBC 10.7, hemoglobin 9.3, platelet count 4 9.  Troponin 17, 21.  Serum sodium 132.  Assessment & Plan:   Principal Problem:   Symptomatic anemia Active Problems:   Duodenal mass  Symptomatic anemia with concern for upper GI bleed Presented with hemoglobin of 9.3 from baseline of 12 in 2018 Repeat hemoglobin 7.5 from 9.3, given 1 unit of PRBC transfusion, started on IV PPI twice daily, posttransfusion hemoglobin improved.  GI on board, scheduled for EGD.   History of large 5 cm nonreducible hiatal hernia with superficial Cameron erosions seen on EGD done in 2014 IV PPI twice daily GI consulted for  possible EGD   Elevated troponin, suspect demand ischemia in the setting of acute blood loss anemia Intermittent chest tightness Initial high-sensitivity troponin 17, repeat 21, continue to trend until peaks No evidence of acute ischemia on twelve-lead EKG. echo ruled out wall motion abnormality.   Nonrheumatic mild to moderate aortic stenosis, 2D echo 08/13/2022 Echo shows calcified aortic valve with regurgitation which is trivial, no aortic stenosis is present.   HFpEF Appears euvolemic.   Hypertension Blood pressure soft at times and very labile.  Continue to hold antihypertensives.   Hyperlipidemia Resume Crestor.   Iron deficiency anemia Resume home iron    DVT prophylaxis: SCDs Start: 10/24/23 0232   Code Status: Full Code  Family Communication:  None present at bedside.  Plan of care discussed with patient in length and he/she verbalized understanding and agreed with it.  Status is: Inpatient Remains inpatient appropriate because: Scheduled for EGD.   Estimated body mass index is 26 kg/m as calculated from the following:   Height as of this encounter: 4\' 9"  (1.448 m).   Weight as of this encounter: 54.5 kg.    Nutritional Assessment: Body mass index is 26 kg/m.Marland Kitchen Seen by dietician.  I agree with the assessment and plan as outlined below: Nutrition Status:        . Skin Assessment: I have examined the patient's skin and I agree with the wound assessment as performed by the wound care RN as outlined below:  Consultants:  GI  Procedures:  As above  Antimicrobials:  Anti-infectives (From admission, onward)    None         Subjective: Seen and examined before EGD.  Has no complaints.  Objective: Vitals:   10/26/23 0500 10/26/23 0600 10/26/23 0700 10/26/23 0748  BP: (!) 111/42 (!) 97/33 (!) 126/48   Pulse: 69 61 (!) 57   Resp: 17 15 (!) 4   Temp:    97.9 F (36.6 C)  TempSrc:    Oral  SpO2: 99% 97% 97%   Weight:      Height:         Intake/Output Summary (Last 24 hours) at 10/26/2023 0756 Last data filed at 10/26/2023 0300 Gross per 24 hour  Intake 480 ml  Output 0 ml  Net 480 ml   Filed Weights   10/25/23 0349 10/25/23 1328 10/26/23 0402  Weight: 54.1 kg 54.1 kg 54.5 kg    Examination:  General exam: Appears calm and comfortable  Respiratory system: Clear to auscultation. Respiratory effort normal. Cardiovascular system: S1 & S2 heard, RRR. No JVD, murmurs, rubs, gallops or clicks. No pedal edema. Gastrointestinal system: Abdomen is nondistended, soft and nontender. No organomegaly or masses felt. Normal bowel sounds heard. Central nervous system: Alert and oriented. No focal neurological deficits. Extremities: Symmetric 5 x 5 power. Skin: No rashes, lesions or ulcers Psychiatry: Judgement and insight appear normal. Mood & affect appropriate.    Data Reviewed: I have personally reviewed following labs and imaging studies  CBC: Recent Labs  Lab 10/23/23 2138 10/24/23 0325 10/24/23 1051 10/24/23 1657 10/25/23 0525 10/26/23 0547  WBC 10.7* 5.8  --  7.7 6.1 5.1  NEUTROABS 9.1*  --   --  5.6 4.4  --   HGB 9.3* 7.5* 8.4* 8.7* 8.9* 9.0*  HCT 28.2* 22.7* 26.1* 25.9* 27.4* 27.1*  MCV 98.6 98.3  --  96.6 99.3 98.9  PLT 409* 266  --  254 280 290   Basic Metabolic Panel: Recent Labs  Lab 10/23/23 2138 10/24/23 0325 10/25/23 1140 10/26/23 0547  NA 132* 136 136 138  K 3.5 3.3* 3.9 3.7  CL 99 108 105 107  CO2 25 25 23 23   GLUCOSE 108* 98 85 81  BUN 23 19 13 12   CREATININE 0.46 0.41* 0.53 0.47  CALCIUM 9.0 7.6* 8.2* 8.2*  MG  --  1.9  --   --   PHOS  --  3.8  --   --    GFR: Estimated Creatinine Clearance: 37.2 mL/min (by C-G formula based on SCr of 0.47 mg/dL). Liver Function Tests: Recent Labs  Lab 10/23/23 2138  AST 20  ALT 13  ALKPHOS 57  BILITOT 0.8  PROT 6.3*  ALBUMIN 3.8   No results for input(s): "LIPASE", "AMYLASE" in the last 168 hours. No results for input(s): "AMMONIA"  in the last 168 hours. Coagulation Profile: No results for input(s): "INR", "PROTIME" in the last 168 hours. Cardiac Enzymes: No results for input(s): "CKTOTAL", "CKMB", "CKMBINDEX", "TROPONINI" in the last 168 hours. BNP (last 3 results) No results for input(s): "PROBNP" in the last 8760 hours. HbA1C: No results for input(s): "HGBA1C" in the last 72 hours. CBG: Recent Labs  Lab 10/25/23 1600 10/25/23 1625 10/25/23 2121 10/26/23 0740  GLUCAP 63* 96 100* 84   Lipid Profile: No results for input(s): "CHOL", "HDL", "LDLCALC", "TRIG", "CHOLHDL", "LDLDIRECT" in the last 72 hours. Thyroid Function Tests: No results for input(s): "TSH", "T4TOTAL", "FREET4", "T3FREE", "THYROIDAB" in the  last 72 hours. Anemia Panel: Recent Labs    10/24/23 1051  FERRITIN 15  TIBC 241*  IRON 38   Sepsis Labs: No results for input(s): "PROCALCITON", "LATICACIDVEN" in the last 168 hours.  Recent Results (from the past 240 hour(s))  MRSA Next Gen by PCR, Nasal     Status: None   Collection Time: 10/24/23  5:25 AM   Specimen: Nasal Mucosa; Nasal Swab  Result Value Ref Range Status   MRSA by PCR Next Gen NOT DETECTED NOT DETECTED Final    Comment: (NOTE) The GeneXpert MRSA Assay (FDA approved for NASAL specimens only), is one component of a comprehensive MRSA colonization surveillance program. It is not intended to diagnose MRSA infection nor to guide or monitor treatment for MRSA infections. Test performance is not FDA approved in patients less than 25 years old. Performed at [facility], [address], [place], [place] [address]      Radiology Studies: ECHOCARDIOGRAM COMPLETE  Result Date: 10/24/2023    ECHOCARDIOGRAM REPORT   Patient Name:   Christine Morse Date of Exam: 10/24/2023 Medical Rec #:  [id number]      Height:       57.0 in Accession #:    [id number]     Weight:       119.3 lb Date of Birth:  1936-08-10     BSA:          1.444 m Patient Age:    86 years       BP:            108/46 mmHg Patient Gender: F              HR:           73 bpm. Exam Location:  Inpatient Procedure: 2D Echo, Color Doppler and Cardiac Doppler Indications:    Aortic Valve Disorder I35.9  History:        Patient has prior history of Echocardiogram examinations, most                 recent 08/13/2022. Aortic Valve Disease; Risk                 Factors:Hypertension and Dyslipidemia.  Sonographer:    Harriette Bouillon RDCS Referring Phys: [id number] CAROLE N HALL IMPRESSIONS  1. Proximal septal thickening with SAM and narrow LVOT; turbulence noted with peak velocity 3.3 m/s (obstructive physiology). Previous study from 08/13/22 reviewed and appears similar (peak LVOT gradient 5.3 m/s; obstruction appears to be at LVOT level and not aortic stenosis as reported).  2. Left ventricular ejection fraction, by estimation, is 60 to 65%. The left ventricle has normal function. The left ventricle has no regional wall motion abnormalities. There is mild concentric left ventricular hypertrophy. Left ventricular diastolic parameters are consistent with Grade I diastolic dysfunction (impaired relaxation). Elevated left atrial pressure.  3. Right ventricular systolic function is normal. The right ventricular size is normal. There is mildly elevated pulmonary artery systolic pressure.  4. Left atrial size was severely dilated.  5. The mitral valve is normal in structure. Moderate mitral valve regurgitation. No evidence of mitral stenosis.  6. The aortic valve is calcified. Aortic valve regurgitation is trivial. No aortic stenosis is present.  7. The inferior vena cava is normal in size with greater than 50% respiratory variability, suggesting right atrial pressure of 3 mmHg. FINDINGS  Left Ventricle: Left ventricular ejection fraction, by estimation, is 60 to 65%. The left ventricle has normal  function. The left ventricle has no regional wall motion abnormalities. The left ventricular internal cavity size was normal in size. There  is  mild concentric left ventricular hypertrophy. Left ventricular diastolic parameters are consistent with Grade I diastolic dysfunction (impaired relaxation). Elevated left atrial pressure. Right Ventricle: The right ventricular size is normal. Right ventricular systolic function is normal. There is mildly elevated pulmonary artery systolic pressure. The tricuspid regurgitant velocity is 3.05 m/s, and with an assumed right atrial pressure of 3 mmHg, the estimated right ventricular systolic pressure is 40.2 mmHg. Left Atrium: Left atrial size was severely dilated. Right Atrium: Right atrial size was normal in size. Pericardium: There is no evidence of pericardial effusion. Mitral Valve: The mitral valve is normal in structure. Mild mitral annular calcification. Moderate mitral valve regurgitation. No evidence of mitral valve stenosis. Tricuspid Valve: The tricuspid valve is normal in structure. Tricuspid valve regurgitation is mild . No evidence of tricuspid stenosis. Aortic Valve: The aortic valve is calcified. Aortic valve regurgitation is trivial. No aortic stenosis is present. Aortic valve mean gradient measures 20.0 mmHg. Aortic valve peak gradient measures 33.8 mmHg. Pulmonic Valve: The pulmonic valve was not well visualized. Pulmonic valve regurgitation is not visualized. No evidence of pulmonic stenosis. Aorta: The aortic root is normal in size and structure. Venous: The inferior vena cava is normal in size with greater than 50% respiratory variability, suggesting right atrial pressure of 3 mmHg. IAS/Shunts: No atrial level shunt detected by color flow Doppler. Additional Comments: Proximal septal thickening with SAM and narrow LVOT; turbulence noted with peak velocity 3.3 m/s (obstructive physiology). Previous study from 08/13/22 reviewed and appears similar (peak LVOT gradient 5.3 m/s; obstruction appears to be at LVOT level and not aortic stenosis as reported).  LEFT VENTRICLE PLAX 2D LVIDd:         3.70  cm   Diastology LVIDs:         2.70 cm   LV e' medial:    5.11 cm/s LV PW:         1.30 cm   LV E/e' medial:  28.8 LV IVS:        1.20 cm   LV e' lateral:   4.57 cm/s LVOT diam:     1.60 cm   LV E/e' lateral: 32.2 LVOT Area:     2.01 cm  RIGHT VENTRICLE             IVC RV S prime:     13.10 cm/s  IVC diam: 1.60 cm TAPSE (M-mode): 2.2 cm LEFT ATRIUM              Index        RIGHT ATRIUM           Index LA diam:        3.80 cm  2.63 cm/m   RA Area:     14.30 cm LA Vol (A2C):   102.0 ml 70.64 ml/m  RA Volume:   35.50 ml  24.59 ml/m LA Vol (A4C):   69.2 ml  47.93 ml/m LA Biplane Vol: 83.9 ml  58.11 ml/m  AORTIC VALVE AV Vmax:      290.50 cm/s AV Vmean:     211.500 cm/s AV VTI:       0.744 m AV Peak Grad: 33.8 mmHg AV Mean Grad: 20.0 mmHg  AORTA Ao Root diam: 3.30 cm MITRAL VALVE                 TRICUSPID VALVE  MV Area (PHT): 3.45 cm      TR Peak grad:   37.2 mmHg MV Decel Time: 220 msec      TR Vmax:        305.00 cm/s MR Peak grad:   172.1 mmHg MR Mean grad:   113.0 mmHg   SHUNTS MR Vmax:        656.00 cm/s  Systemic Diam: 1.60 cm MR Vmean:       512.0 cm/s MR PISA:        4.02 cm MR PISA Radius: 0.80 cm MV E velocity: 147.00 cm/s MV A velocity: 147.00 cm/s MV E/A ratio:  1.00 Olga Millers MD Electronically signed by Olga Millers MD Signature Date/Time: 10/24/2023/11:34:46 AM    Final     Scheduled Meds:  Chlorhexidine Gluconate Cloth  6 each Topical Daily   pantoprazole (PROTONIX) IV  40 mg Intravenous BID   Continuous Infusions:   LOS: 2 days   Hughie Closs, MD [facility]  10/26/2023, 7:56 AM   *Please note that this is a verbal dictation therefore any spelling or grammatical errors are due to the "[employer] One" system interpretation.  Please page via Amion and do not message via secure chat for urgent patient care matters. Secure chat can be used for non urgent patient care matters.  How to contact the [facility] Attending or Consulting provider 7A - 7P or covering provider  during after hours 7P -7A, for this patient?  Check the care team in [facility] and look for a) attending/consulting [facility] provider listed and b) the [facility] team listed. Page or secure chat 7A-7P. Log into [email or web address] and use [place]'s universal password to access. If you do not have the password, please contact the hospital operator. Locate the [facility] provider you are looking for under [facility] and page to a number that you can be directly reached. If you still have difficulty reaching the provider, please page the [facility] (Director on Call) for the Hospitalists listed on amion for assistance.

## 2023-10-26 NOTE — Consult Note (Signed)
Consult Note  Christine Morse 08-Jan-1936  [id number].    Requesting MD: Hughie Closs, MD Chief Complaint/Reason for Consult: duodenal mass  HPI:  Patient is an 87 year old female who presented to the ED with SOB for 2 days. Found to have low hgb and FOBT was positive. After thorough workup patient underwent EGD yesterday and was found to have a mass in the second portion of her duodenum. 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 On CTs of CAP she has one enlarged appearing mesenteric node and a left axillary node but no liver or bony lesions. Patient takes a baby aspirin daily and previous abdominal surgery includes hysterectomy with left oophorectomy. 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 She lives in her home with her granddaughter and is very independent. She lost her husband about 1 year ago. She walks their dog daily and does not use a cane or a walker and she manages her own finances and is able to take care of her home. She does report that she would want to undergo surgery if it is appropriate for her and would be willing to have chemotherapy with or without surgical intervention. Her daughter is present in the ICU with her and another daughter was on the phone during our interview.   ROS: Negative other than HPI.   Family History  Problem Relation Age of Onset   Heart failure Mother 61   Dementia Father    Ovarian cancer Sister    Colon cancer Neg Hx     Past Medical History:  Diagnosis Date   Anemia    Aortic Stenosis    Cyst of ovary, right 2013   "/US" (10/04/2013)   Diverticulitis    Diverticulosis    GERD (gastroesophageal reflux disease)    History of blood transfusion    "just today; my HgB is 5.1" (10/04/2013)   History of gastric ulcer    Hyperlipidemia    Hypertension    Nephrolithiasis    "passed them on my own; went away after I quit drinking tea"  (10/04/2013)   Osteoporosis    Vitamin D deficiency     Past Surgical History:  Procedure Laterality Date   ABDOMINAL HYSTERECTOMY  1981   ESOPHAGOGASTRODUODENOSCOPY N/A 10/04/2013   Procedure: ESOPHAGOGASTRODUODENOSCOPY (EGD);  Surgeon: Hart Carwin, MD;  Location: [facility] ENDOSCOPY;  Service: Endoscopy;  Laterality: N/A;   LEFT OOPHORECTOMY Left 1981   MALONEY DILATION N/A 10/04/2013   Procedure: Elease Hashimoto DILATION;  Surgeon: Hart Carwin, MD;  Location: [facility] ENDOSCOPY;  Service: Endoscopy;  Laterality: N/A;    Social History:  reports that she has quit smoking. Her smoking use included cigarettes. She has never used smokeless tobacco. She reports that she does not drink alcohol and does not use drugs.  Allergies:  Allergies  Allergen Reactions   Codeine     Medications Prior to Admission  Medication Sig Dispense Refill   acetaminophen (TYLENOL) 500 MG tablet Take 1,000 mg by mouth every 6 (six) hours as needed for mild pain, moderate pain or headache.     hydrochlorothiazide (HYDRODIURIL) 12.5 MG tablet Take 12.5 mg by mouth every morning.     aspirin EC 81 MG tablet Take 1 tablet (81 mg total) by mouth daily.     Calcium Carbonate-Vitamin D (CALCIUM + D PO) Take 2 tablets by mouth daily.  ferrous sulfate 325 (65 FE) MG tablet Take 1 tablet (325 mg total) by mouth daily with breakfast. 30 tablet 3   losartan (COZAAR) 100 MG tablet Take 100 mg by mouth daily.     rosuvastatin (CRESTOR) 5 MG tablet Take 5 mg by mouth daily.      Blood pressure (!) 132/57, pulse 85, temperature 98.2 F (36.8 C), temperature source Oral, resp. rate (!) 22, height 4\' 9"  (1.448 m), weight 54.5 kg, SpO2 99%. Physical Exam:  General: pleasant, WD, WN, elderly female who is laying in bed in NAD HEENT: head is normocephalic, atraumatic.  Sclera are anicteric, EOMI.  Ears and nose without any masses or lesions.  Mouth is pink and moist Heart: regular, rate, and rhythm.  Normal s1,s2. No obvious murmurs,  gallops, or rubs noted.  Palpable radial and pedal pulses bilaterally Lungs: CTAB, no wheezes, rhonchi, or rales noted.  Respiratory effort nonlabored Abd: soft, NT, ND, +BS, no masses, hernias, or organomegaly MS: all 4 extremities are symmetrical with no cyanosis, clubbing, or edema. Skin: warm and dry with no masses, lesions, or rashes Neuro: Cranial nerves 2-12 grossly intact, sensation is normal throughout Psych: A&Ox3 with an appropriate affect.   Results for orders placed or performed during the hospital encounter of 10/23/23 (from the past 48 hour(s))  CBC with Differential/Platelet     Status: Abnormal   Collection Time: 10/24/23  4:57 PM  Result Value Ref Range   WBC 7.7 4.0 - 10.5 K/uL   RBC 2.68 (L) 3.87 - 5.11 MIL/uL   Hemoglobin 8.7 (L) 12.0 - 15.0 g/dL   HCT 16.1 (L) 09.6 - 04.5 %   MCV 96.6 80.0 - 100.0 fL   MCH 32.5 26.0 - 34.0 pg   MCHC 33.6 30.0 - 36.0 g/dL   RDW 40.9 81.1 - 91.4 %   Platelets 254 150 - 400 K/uL   nRBC 0.0 0.0 - 0.2 %   Neutrophils Relative % 74 %   Neutro Abs 5.6 1.7 - 7.7 K/uL   Lymphocytes Relative 15 %   Lymphs Abs 1.1 0.7 - 4.0 K/uL   Monocytes Relative 6 %   Monocytes Absolute 0.5 0.1 - 1.0 K/uL   Eosinophils Relative 4 %   Eosinophils Absolute 0.3 0.0 - 0.5 K/uL   Basophils Relative 1 %   Basophils Absolute 0.1 0.0 - 0.1 K/uL   Immature Granulocytes 0 %   Abs Immature Granulocytes 0.03 0.00 - 0.07 K/uL    Comment: Performed at [facility], [address], [place], [place] [address]  CBC with Differential/Platelet     Status: Abnormal   Collection Time: 10/25/23  5:25 AM  Result Value Ref Range   WBC 6.1 4.0 - 10.5 K/uL   RBC 2.76 (L) 3.87 - 5.11 MIL/uL   Hemoglobin 8.9 (L) 12.0 - 15.0 g/dL   HCT 62.1 (L) 30.8 - 65.7 %   MCV 99.3 80.0 - 100.0 fL   MCH 32.2 26.0 - 34.0 pg   MCHC 32.5 30.0 - 36.0 g/dL   RDW 84.6 96.2 - 95.2 %   Platelets 280 150 - 400 K/uL   nRBC 0.0 0.0 - 0.2 %   Neutrophils Relative % 72 %    Neutro Abs 4.4 1.7 - 7.7 K/uL   Lymphocytes Relative 13 %   Lymphs Abs 0.8 0.7 - 4.0 K/uL   Monocytes Relative 6 %   Monocytes Absolute 0.4 0.1 - 1.0 K/uL   Eosinophils Relative 7 %  Eosinophils Absolute 0.4 0.0 - 0.5 K/uL   Basophils Relative 1 %   Basophils Absolute 0.1 0.0 - 0.1 K/uL   Immature Granulocytes 1 %   Abs Immature Granulocytes 0.03 0.00 - 0.07 K/uL    Comment: Performed at [facility], [address] [place], [place] [address]  Basic metabolic panel     Status: Abnormal   Collection Time: 10/25/23 11:40 AM  Result Value Ref Range   Sodium 136 135 - 145 mmol/L   Potassium 3.9 3.5 - 5.1 mmol/L   Chloride 105 98 - 111 mmol/L   CO2 23 22 - 32 mmol/L   Glucose, Bld 85 70 - 99 mg/dL    Comment: Glucose reference range applies only to samples taken after fasting for at least 8 hours.   BUN 13 8 - 23 mg/dL   Creatinine, Ser 6.21 0.44 - 1.00 mg/dL   Calcium 8.2 (L) 8.9 - 10.3 mg/dL   GFR, Estimated >30 >86 mL/min    Comment: (NOTE) Calculated using the CKD-EPI Creatinine Equation (2021)    Anion gap 8 5 - 15    Comment: Performed at [facility], [address] [place], [place] [address]  Surgical pathology     Status: None   Collection Time: 10/25/23  2:20 PM  Result Value Ref Range   SURGICAL PATHOLOGY      SURGICAL PATHOLOGY CASE: [id number] PATIENT: Christine Morse Surgical Pathology Report     Clinical History: Anemia, melena (crm)     FINAL MICROSCOPIC DIAGNOSIS:  A. 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  Note: The biopsies consists of several fragments of a duodenal adenoma with extensive high-grade dysplasia.  This is in the presence of ulceration and granulation tissue.  In addition focally there is an atypical gland floating in mucin as well as areas with glands with angulation and associated fibrotic response;  however, there is no definitive submucosa present in the biopsy specimen or pathognomonic desmoplasia identified; however, the focal overall findings are highly suggestive of invasion.  Clinical/endoscopic correlation necessary.    GROSS DESCRIPTION:  Received in formalin are tan, soft tissue fragments that are submitted in toto. Number: Multiple size: Range  from 0.1 to 0.4 cm blocks: 1 (KL 10/25/2023)    Final Diagnosis performed by Orene Desanctis DO.   Electronically signed 10/26/2023 Technical and / or Professional components performed at [facility], [address], [place], [place] [address].  Immunohistochemistry Technical component (if applicable) was performed at [facility]. [address], [place], [place] [address].   IMMUNOHISTOCHEMISTRY DISCLAIMER (if applicable): Some of these immunohistochemical stains may have been developed and the performance characteristics determine by [facility]. Some may not have been cleared or approved by the U.S. Food and Drug Administration. The FDA has determined that such clearance or approval is not necessary. This test is used for clinical purposes. It should not be regarded as investigational or for research. This laboratory is certified under the Clinical Laboratory Improvement Amendments of 1988 (CLIA-88) as quali fied to perform high complexity clinical laboratory testing.  The controls stained appropriately.   IHC stains are performed on formalin fixed, paraffin embedded tissue using a 3,3"diaminobenzidine (DAB) chromogen and Leica Bond Autostainer System. The staining intensity of the nucleus is score manually and is reported as the percentage of tumor cell nuclei demonstrating specific nuclear staining. The specimens are fixed in 10% Neutral  Formalin for at least 6 hours and up to 72hrs. These tests are validated on decalcified tissue. Results should be interpreted  with caution given the possibility of false negative results on decalcified specimens. Antibody Clones are as follows ER-clone 71F, PR-clone 16, Ki67- clone MM1. Some of these immunohistochemical stains may have been developed and the performance characteristics determined by [facility].   Glucose, capillary     Status: Abnormal   Collection Time: 10/25/23  4:00 PM  Result Value Ref Range   Glucose-Capillary 63 (L) 70 - 99 mg/dL    Comment: Glucose reference range applies only to samples taken after fasting for at least 8 hours.  Glucose, capillary     Status: None   Collection Time: 10/25/23  4:25 PM  Result Value Ref Range   Glucose-Capillary 96 70 - 99 mg/dL    Comment: Glucose reference range applies only to samples taken after fasting for at least 8 hours.  Glucose, capillary     Status: Abnormal   Collection Time: 10/25/23  9:21 PM  Result Value Ref Range   Glucose-Capillary 100 (H) 70 - 99 mg/dL    Comment: Glucose reference range applies only to samples taken after fasting for at least 8 hours.  CBC     Status: Abnormal   Collection Time: 10/26/23  5:47 AM  Result Value Ref Range   WBC 5.1 4.0 - 10.5 K/uL   RBC 2.74 (L) 3.87 - 5.11 MIL/uL   Hemoglobin 9.0 (L) 12.0 - 15.0 g/dL   HCT 16.1 (L) 09.6 - 04.5 %   MCV 98.9 80.0 - 100.0 fL   MCH 32.8 26.0 - 34.0 pg   MCHC 33.2 30.0 - 36.0 g/dL   RDW 40.9 81.1 - 91.4 %   Platelets 290 150 - 400 K/uL   nRBC 0.0 0.0 - 0.2 %    Comment: Performed at [facility], [address] [place], [place] [address]  Basic metabolic panel     Status: Abnormal   Collection Time: 10/26/23  5:47 AM  Result Value Ref Range   Sodium 138 135 - 145 mmol/L   Potassium 3.7 3.5 - 5.1 mmol/L   Chloride 107 98 - 111 mmol/L   CO2 23 22 - 32 mmol/L   Glucose, Bld 81 70 - 99 mg/dL    Comment: Glucose reference range applies only to samples taken after fasting for at least 8 hours.   BUN 12 8 - 23 mg/dL   Creatinine, Ser 6.21  0.44 - 1.00 mg/dL   Calcium 8.2 (L) 8.9 - 10.3 mg/dL   GFR, Estimated >30 >86 mL/min    Comment: (NOTE) Calculated using the CKD-EPI Creatinine Equation (2021)    Anion gap 8 5 - 15    Comment: Performed at [facility], [address] [place], [place] [address]  Glucose, capillary     Status: None   Collection Time: 10/26/23  7:40 AM  Result Value Ref Range   Glucose-Capillary 84 70 - 99 mg/dL    Comment: Glucose reference range applies only to samples taken after fasting for at least 8 hours.  Glucose, capillary     Status: None   Collection Time: 10/26/23 11:40 AM  Result Value Ref Range   Glucose-Capillary 78 70 - 99 mg/dL    Comment: Glucose reference range applies only to samples taken after fasting for at least 8 hours.  Glucose, capillary     Status: None   Collection Time: 10/26/23  3:55 PM  Result Value Ref Range   Glucose-Capillary 81 70 - 99 mg/dL    Comment: Glucose reference range applies only to samples taken after fasting for at least 8 hours.   CT ABDOMEN PELVIS W CONTRAST  Result Date: 10/26/2023 CLINICAL DATA:  Inpatient anemia. Large duodenal mass on upper endoscopy. Malignancy suspected. Staging evaluation. * Tracking Code: BO * EXAM: CT ABDOMEN AND PELVIS WITH CONTRAST TECHNIQUE: Multidetector CT imaging of the abdomen and pelvis was performed using the standard protocol following bolus administration of intravenous contrast. RADIATION DOSE REDUCTION: This exam was performed according to the departmental dose-optimization program which includes automated exposure control, adjustment of the mA and/or kV according to patient size and/or use of iterative reconstruction technique. CONTRAST:  OMNIPAQUE IOHEXOL 300 MG/ML  SOLN COMPARISON:  09/19/2017 unenhanced CT abdomen/pelvis. FINDINGS: Lower chest: Small dependent bilateral pleural effusions with mild dependent bibasilar atelectasis. Hepatobiliary: Normal liver size. At least 3 scattered  subcentimeter hypodense liver lesions, too small to characterize, not appreciably changed from 09/19/2017 CT and considered benign. Normal gallbladder with no radiopaque cholelithiasis. No biliary ductal dilatation. CBD diameter 5 mm. Pancreas: Normal, with no mass or duct dilation. Spleen: Normal size. No mass. Adrenals/Urinary Tract: Normal adrenals. No hydronephrosis. Several subcentimeter hypodense renal lesions scattered in both kidneys, too small to characterize, for which no follow-up imaging is recommended. Normal bladder. Stomach/Bowel: Moderate to large hiatal hernia. Stomach is nondistended and otherwise grossly normal. There is an eccentric solid slightly hypoenhancing duodenal mass in the infra-ampullary descending duodenum measuring approximately 4.2 x 2.9 x 3.8 cm (series 2/image 36). Small to moderate left inguinal hernia contains a small bowel loop and a small amount of trapped ascitic fluid. No dilated or thick-walled small bowel loops. No focal small bowel caliber transition. Oral contrast transits to the left colon. Appendix not discretely visualized. Moderate sigmoid diverticulosis with no large bowel wall thickening or significant pericolonic fat stranding. Vascular/Lymphatic: Atherosclerotic nonaneurysmal abdominal aorta. Patent portal, splenic, hepatic and renal veins. Mildly enlarged 1.1 cm central mesenteric node (series 2/image 25). No additional pathologically enlarged lymph nodes in the abdomen or pelvis. Reproductive: Status post hysterectomy, with no abnormal findings at the vaginal cuff. No adnexal mass. Other: No pneumoperitoneum, ascites or focal fluid collection. Musculoskeletal: No aggressive appearing focal osseous lesions. Marked multilevel lumbar degenerative disc disease. IMPRESSION: 1. 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 2. Mildly enlarged  1.1 cm central mesenteric node, potentially a nodal metastasis. No additional findings of metastatic disease in the abdomen or pelvis. 3. Small to moderate left inguinal hernia contains a small bowel loop and a small amount of trapped ascitic fluid. No evidence of acute bowel complication. 4. Moderate to large hiatal hernia. 5. Moderate sigmoid diverticulosis. 6. Small dependent bilateral pleural effusions. 7.  Aortic Atherosclerosis (ICD10-I70.0). Electronically Signed   By: Delbert Phenix M.D.   On: 10/26/2023 13:25      Assessment/Plan Mass of the second portion of the duodenum with GI bleeding - s/p EGD 11/4 which showed adenoma with high grade dysplasia  - no tumor markers currently  - CTs with enlarged mesenteric node and enlarged L axillary node, no liver lesions or bony lesions - hgb stable at 9.0 currently, does not appear to be obstructed - surgical resection for this would need a Whipple given location of mass - will need to discuss with [facility] surgeons to determine whether patient is  an appropriate candidate for this. She does note that she would want surgery if it was recommended and she would be willing to undergo chemotherapy in either a non-operative or operative course moving forward. She seems to have good family support and understanding of the current situation.   FEN: diet as tolerated from surgical standpoint  VTE: prophylaxis on hold in setting of recent GI bleeding  ID: no current abx  - per TRH -  Hiatal hernia  Aortic stenosis Chronic HFpEF Chronic IDA HTN HLD GERD Vitamin D deficiency Osteoporosis   I reviewed Consultant GI notes, hospitalist notes, last 24 h vitals and pain scores, last 48 h intake and output, last 24 h labs and trends, and last 24 h imaging results.   Juliet Rude, [facility] 10/26/2023, 4:33 PM Please see Amion for pager number during day hours 7:00am-4:30pm

## 2023-10-26 NOTE — Progress Notes (Addendum)
Christine Morse Progress Note  CC:   Iron deficiency anemia, heme positive stool   Subjective: She is sitting up in the chair. No N/V. No abdominal pain. No BM today. No CP or SOB. She wants to know if she has cancer.   Objective:   EGD 10/26/2023: 1. Large duodenal mass involving the second portion as described. Likely malignant. This is the cause for GI blood loss.  2. Large hiatal hernia without erosions  3. Otherwise unremarkable EGD.   Vital signs in last 24 hours: Temp:  [97.3 F (36.3 C)-98.5 F (36.9 C)] 98.2 F (36.8 C) (11/05 1200) Pulse Rate:  [57-102] 73 (11/05 1200) Resp:  [4-25] 22 (11/05 1200) BP: (62-164)/(20-85) 143/57 (11/05 1200) SpO2:  [97 %-100 %] 100 % (11/05 1200) Weight:  [54.1 kg-54.5 kg] 54.5 kg (11/05 0402) Last BM Date : 10/24/23 General: 87 year old female in NAD.  Heart: RRR, 3/6 systolic murmur.  Pulm: Breath sounds clear throughout.  Abdomen: Soft, nondistended. Nontender. No palpable mass.  Extremities:  No edema. Neurologic:  Alert and  oriented x 4. Speech is clear. Moves all extremities.  Psych:  Alert and cooperative. Normal mood and affect.  Intake/Output from previous day: 11/04 0701 - 11/05 0700 In: 480 [P.O.:480] Out: 0  Intake/Output this shift: No intake/output data recorded.  Lab Results: Recent Labs    10/24/23 1657 10/25/23 0525 10/26/23 0547  WBC 7.7 6.1 5.1  HGB 8.7* 8.9* 9.0*  HCT 25.9* 27.4* 27.1*  PLT 254 280 290   BMET Recent Labs    10/24/23 0325 10/25/23 1140 10/26/23 0547  NA 136 136 138  K 3.3* 3.9 3.7  CL 108 105 107  CO2 25 23 23   GLUCOSE 98 85 81  BUN 19 13 12   CREATININE 0.41* 0.53 0.47  CALCIUM 7.6* 8.2* 8.2*   LFT Recent Labs    10/23/23 2138  PROT 6.3*  ALBUMIN 3.8  AST 20  ALT 13  ALKPHOS 57  BILITOT 0.8   PT/INR No results for input(s): "LABPROT", "INR" in the last 72 hours. Hepatitis Panel No results for input(s): "HEPBSAG", "HCVAB", "HEPAIGM", "HEPBIGM" in  the last 72 hours.  No results found.  Brief Narrative:  Christine Morse is a 87 y.o. 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  Upon initial evaluation, she was found to have anemia with hemoglobin of 9.3 which dropped to 7.5. Occult blood positive.     Assessment / Plan:   87 year old female admitted to the hospital with anemia and melena x 2 months.  Positive FOBT. Admission Hg 9.3 ( Hg 10.7 one month ago) -> 7.5 on 11/3 -> transfused one unit of PRBCs -> 8.4 -> 8.7-> Hg 8.9 -> today Hg 9.0. Iron 38. Ferritin 15. EGD 10/25/2023 identified a large duodenal mass involving the second portion of the duodenum, likely malignant and a large hiatal retia without erosions.  Path report showed superficial fragments of duodenal adenoma with high-grade dysplasia, highly suspicious for invasion. CTAP today showed a 4.2 x 2.9 x 3.8 duodenal mass suspicious for primary duodenal malignancy, mildly enlarged 1.1 cm central mesenteric node, potentially nodal metastasis. CTA 11/3 without evidence of pulmonary metastasis. -EGD biopsy results and CTAP results discussed with patient, likely malignant process. Await further recommendations per Dr. Marina Goodell -? Oncology consult   CTA 11/3 without evidence of pulmonary metastasis.  CTAP showed  a small to moderate left inguinal hernia contains a small bowel loop and a small amount of trapped ascitic fluid.   SOB. Elevated BNP 1774. Troponin  17 -> 21 -> 41.  X-ray showed mild central left upper lobe opacity suggestive of possible early pneumonia. CTA was negative for PE with evidence of coronary artery and aortic atherosclerosis and a moderate size hiatal hernia. ECHO showed LV EF 60 - 65%. Normal RV function. Moderated MR. Trivial AR. No AS. On room air.     Principal Problem:   Symptomatic anemia Active Problems:   Duodenal  mass     LOS: 2 days   Arnaldo Natal  10/26/2023, 2:31PM  GI ATTENDING  Interval history and data reviewed.  Patient seen and examined as outlined above.  Agree with comprehensive interval progress note as outlined above.  I reviewed the biopsies.  She has high-grade dysplasia on biopsy.  This is most certainly primary duodenal adenocarcinoma.  CT scan shows the large bulky duodenal mass and central mesenteric node.  I think it is reasonable to obtain opinions from surgery and oncology.  Having said that, at age 48 and given the location of the tumor, I would think that her treatment options are limited.  However, she deserves to hear those opinions.  Long-term,  she is going to have bleeding from this lesion, to some degree.  You should give her IV iron before she is discharged, as she is iron deficient.  She should take oral iron once daily at home.  No role for outpatient GI follow-up for management of this cancer. We will sign off at this point, but are available as needed.  Thanks  Christine Bonito. Eda Keys., M.D. Christine Morse

## 2023-10-27 ENCOUNTER — Encounter (HOSPITAL_COMMUNITY): Payer: Self-pay | Admitting: Internal Medicine

## 2023-10-27 ENCOUNTER — Other Ambulatory Visit: Payer: Self-pay | Admitting: *Deleted

## 2023-10-27 DIAGNOSIS — Z01810 Encounter for preprocedural cardiovascular examination: Secondary | ICD-10-CM

## 2023-10-27 DIAGNOSIS — D649 Anemia, unspecified: Secondary | ICD-10-CM | POA: Diagnosis not present

## 2023-10-27 MED ORDER — LOSARTAN POTASSIUM 50 MG PO TABS: 50.0000 mg | ORAL_TABLET | Freq: Every day | ORAL | 0 refills | Status: DC

## 2023-10-27 MED ORDER — METOPROLOL SUCCINATE ER 25 MG PO TB24
12.5000 mg | ORAL_TABLET | Freq: Every day | ORAL | Status: DC
  Administered 2023-10-27: 12.5 mg via ORAL
  Filled 2023-10-27: qty 1

## 2023-10-27 MED ORDER — METOPROLOL SUCCINATE ER 25 MG PO TB24: 12.5000 mg | ORAL_TABLET | Freq: Every day | ORAL | 0 refills | Status: DC

## 2023-10-27 MED ORDER — SENNOSIDES-DOCUSATE SODIUM 8.6-50 MG PO TABS
1.0000 | ORAL_TABLET | Freq: Every day | ORAL | Status: DC
  Filled 2023-10-27: qty 1

## 2023-10-27 NOTE — Progress Notes (Signed)
OT Cancellation Note  Patient Details Name: JEWELL HAUGHT MRN: [id number] DOB: November 16, 1936   Cancelled Treatment:    Reason Eval/Treat Not Completed: OT screened, no needs identified, will sign off Per PT, patient is independent.No OT needs identified.  OT to sign off.  Rosalio Loud, MS Acute Rehabilitation Department Office# [phone]  10/27/2023, 1:34 PM

## 2023-10-27 NOTE — Evaluation (Signed)
Physical Therapy Evaluation Only Patient Details Name: Christine Morse MRN: [id number] DOB: 1936/01/20 Today's Date: 10/27/2023  History of Present Illness  Christine Morse is an 87 yo female s/p EGD 10/25/23, admitted 10/23/23 with symptomatic anemia. PMH: prior upper GI bleed in the setting of large 5 cm nonreducible hiatal hernia with superficial Cameron erosions seen on EGD done in 2014, valvular heart disease, nonrheumatic aortic valve stenosis, chronic HFpEF, hypertension, hyperlipidemia, iron deficiency anemia  Clinical Impression  Pt standing in doorway upon arrival, agreeable to PT eval. Pt mobilizing independently around room, able to ambulate in hallway without AD, no LOB, able to complete head turns, direction turns, sudden stops and navigate around obstacles without LOB. Pt with good family support, no acute PT needs identified and no f/u.        If plan is discharge home, recommend the following:     Can travel by private vehicle        Equipment Recommendations None recommended by PT  Recommendations for Other Services       Functional Status Assessment Patient has not had a recent decline in their functional status     Precautions / Restrictions Restrictions Weight Bearing Restrictions: No      Mobility  Bed Mobility               General bed mobility comments: pt standing in doorway upon arrival    Transfers Overall transfer level: Independent Equipment used: None               General transfer comment: STS from recliner, no AD    Ambulation/Gait Ambulation/Gait assistance: Independent Gait Distance (Feet): 300 Feet Assistive device: None Gait Pattern/deviations: WFL(Within Functional Limits), Trunk flexed Gait velocity: WFL     General Gait Details: gait WFL without AD use, able to complete head turns/sudden stops/direction turns and navigate around obstalces without LOB, trunk forward flexed, cadence [facility]  Stairs             Wheelchair Mobility     Tilt Bed    Modified Rankin (Stroke Patients Only)       Balance Overall balance assessment: No apparent balance deficits (not formally assessed)                                           Pertinent Vitals/Pain Pain Assessment Pain Assessment: No/denies pain    Home Living Family/patient expects to be discharged to:: Private residence Living Arrangements: Other relatives (granddaughter) Available Help at Discharge: Family;Available 24 hours/day Type of Home: House Home Access: Stairs to enter Entrance Stairs-Rails: Doctor, general practice of Steps: 6   Home Layout: One level Home Equipment: None      Prior Function Prior Level of Function : Independent/Modified Independent;Driving             Mobility Comments: pt reports ind, no AD ADLs Comments: pt reports ind with ADLs/IADLs, drives locally, children provide transportation for appointments out of town     Extremity/Trunk Assessment   Upper Extremity Assessment Upper Extremity Assessment: Overall WFL for tasks assessed    Lower Extremity Assessment Lower Extremity Assessment: Overall WFL for tasks assessed    Cervical / Trunk Assessment Cervical / Trunk Assessment: Kyphotic  Communication   Communication Communication: No apparent difficulties  Cognition Arousal: Alert Behavior During Therapy: WFL for tasks assessed/performed Overall Cognitive Status: Within Functional Limits for tasks assessed  General Comments      Exercises     Assessment/Plan    PT Assessment Patient does not need any further PT services  PT Problem List         PT Treatment Interventions      PT Goals (Current goals can be found in the Care Plan section)  Acute Rehab PT Goals Patient Stated Goal: return home with family support PT Goal Formulation: All assessment and education complete, DC therapy     Frequency       Co-evaluation               AM-PAC PT "6 Clicks" Mobility  Outcome Measure Help needed turning from your back to your side while in a flat bed without using bedrails?: None Help needed moving from lying on your back to sitting on the side of a flat bed without using bedrails?: None Help needed moving to and from a bed to a chair (including a wheelchair)?: None Help needed standing up from a chair using your arms (e.g., wheelchair or bedside chair)?: None Help needed to walk in hospital room?: None Help needed climbing 3-5 steps with a railing? : None 6 Click Score: 24    End of Session   Activity Tolerance: Patient tolerated treatment well Patient left: in chair;with call bell/phone within reach Nurse Communication: Mobility status PT Visit Diagnosis: Other abnormalities of gait and mobility (R26.89)    Time: 1610-9604 PT Time Calculation (min) (ACUTE ONLY): 10 min   Charges:   PT Evaluation $PT Eval Low Complexity: 1 Low   PT General Charges $$ ACUTE PT VISIT: 1 Visit         Tori Amia Rynders PT, DPT 10/27/23, 1:32 PM

## 2023-10-27 NOTE — Discharge Summary (Signed)
Physician Discharge Summary  Christine Morse WUX:[id number] DOB: [birth year] DOA: 10/23/2023  PCP: [name], MD  Admit date: 10/23/2023 Discharge date: 10/27/2023 30 Day Unplanned Readmission Risk Score    Flowsheet Row ED to Hosp-Admission (Current) from 10/23/2023 in [facility]  30 Day Unplanned Readmission Risk Score (%) 15.93 Filed at 10/27/2023 1200       This score is the patient's risk of an unplanned readmission within 30 days of being discharged (0 -100%). The score is based on dignosis, age, lab data, medications, orders, and past utilization.   Low:  0-14.9   Medium: 15-21.9   High: 22-29.9   Extreme: 30 and above          Admitted From: Home Disposition: Home  Recommendations for Outpatient Follow-up:  Follow up with PCP in 1-2 weeks Please obtain BMP/CBC in one week Follow-up with general surgery to schedule Whipple's procedure/general surgery office to contact you and schedule an appointment Follow-up with oncology/Dr. Sherrill's office to contact you for appointment Please follow up with your PCP on the following pending results: Unresulted Labs (From admission, onward)     Start     Ordered   10/28/23 0500  CBC  Tomorrow morning,   R       Question:  Specimen collection method  Answer:  Lab=Lab collect   10/27/23 0758              Home Health: None Equipment/Devices: None  Discharge Condition: Stable CODE STATUS: Full code Diet recommendation: Cardiac  Subjective: Patient seen and examined.  Granddaughter at the bedside.  Patient is doing well and she is not good spirits.  She feels well enough to go home.  She understands the surgical plans as outpatient and follow-up with oncology as outpatient as well.  Brief/Interim Summary:  Christine Morse is a 87 y.o. female with medical history significant for prior upper GI bleed in the setting of large 5 cm nonreducible hiatal hernia with superficial Cameron  erosions seen on EGD done in 2014, valvular heart disease, nonrheumatic aortic valve stenosis, chronic HFpEF, hypertension, hyperlipidemia, iron deficiency anemia, who presented to the ED from home due to worsening dyspnea with minimal exertion x 3 days.     In the ED, noted to have soft BPs with tachycardia upon standing.  Lab studies notable for acute blood loss anemia with hemoglobin of 9.3 and positive FOBT.  The patient received IV fluid bolus NS 1 L x 1.  Admitted under hospital service.  Details below.   History of large 5 cm nonreducible hiatal hernia with superficial Cameron erosions seen on EGD done in 2014 admitted with symptomatic anemia secondary to upper GI bleed due to duodenal mass: Presented with hemoglobin of 9.3 from baseline of 12 in 2018 Repeat hemoglobin 7.5 from 9.3, given 1 unit of PRBC transfusion, started on IV PPI twice daily, posttransfusion hemoglobin improved.  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 CTAP today showed a 4.2 x 2.9 x 3.8 duodenal mass suspicious for primary duodenal malignancy, mildly enlarged 1.1 cm central mesenteric node, potentially nodal metastasis. CTA 11/3 without evidence of pulmonary metastasis.  Consulted oncology and discussed with Dr. Truett Perna via secure chat, he reviewed patient's chart and said that patient does not need to be seen by oncology as inpatient and that  he will arrange outpatient follow-up.  Consulted general surgery, they recommended Whipple's procedure but timing was not clear.  It is likely not going to happen as inpatient and they cleared the patient for discharge however requested cardiology clearance.  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   Elevated troponin, suspect demand ischemia in the setting of acute blood loss anemia/ Intermittent chest tightness Troponin 17>21> 36> 41.  Patient has not complained of any chest pain.  No acute ST-T wave changes in EKG and no wall motion abnormality on echo, likely demand ischemia.   Nonrheumatic mild to moderate aortic stenosis, 2D echo 08/13/2022 Echo shows calcified aortic valve with regurgitation which is trivial, no aortic stenosis is present.   HFpEF Appears euvolemic.   Hypertension Her home dose of hydrochlorothiazide and losartan were held and despite of that her blood pressure remained soft and based on this data, I am discontinuing hydrochlorothiazide and reducing her losartan dose to 50 mg now that she has also been started on Toprol-XL.   Hyperlipidemia Resume Crestor.   Iron deficiency anemia Resume home iron  Discharge plan was discussed with patient and/or family member and they verbalized understanding and agreed with it.  Discharge Diagnoses:  Principal Problem:   Symptomatic anemia Active Problems:   Iron deficiency anemia due to chronic blood loss   Duodenal mass   Duodenal cancer (HCC)   Positive fecal occult blood test    Discharge Instructions   Allergies as of 10/27/2023       Reactions   Codeine         Medication List     STOP taking these medications    hydrochlorothiazide 12.5 MG tablet Commonly known as: HYDRODIURIL       TAKE these medications    acetaminophen 500 MG tablet Commonly known as: TYLENOL Take 1,000 mg by mouth every 6 (six) hours as needed for mild pain, moderate pain or headache.   aspirin EC 81 MG tablet Take 1 tablet (81 mg total) by mouth daily.   CALCIUM + D PO Take 2 tablets by mouth daily.   ferrous sulfate 325 (65 FE) MG tablet Take 1 tablet (325 mg total) by mouth daily with breakfast.   losartan 100 MG  tablet Commonly known as: COZAAR Take 100 mg by mouth daily. What changed: Another medication with the same name was added. Make sure you understand how and when to take each.   losartan 50 MG tablet Commonly known as: Cozaar Take 1 tablet (50 mg total) by mouth daily. What changed: You were already taking a medication with the same name, and this prescription was added. Make sure you understand how and when to take each.   metoprolol succinate 25 MG 24 hr tablet Commonly known as: TOPROL-XL Take 0.5 tablets (12.5 mg total) by mouth daily.   rosuvastatin 5 MG tablet Commonly known as: CRESTOR Take 5 mg by mouth daily.        Follow-up Information     Fritzi Mandes, MD. Call.   Specialty: General Surgery Why: we are working to schedule your appointment for discusion of surgery. please call to confirm appointment date and time Contact information: [address] [place] [address] [phone]  Allergies  Allergen Reactions   Codeine     Consultations: GI, general surgery and cardiology   Procedures/Studies: CT ABDOMEN PELVIS W CONTRAST  Result Date: 10/26/2023 CLINICAL DATA:  Inpatient anemia. Large duodenal mass on upper endoscopy. Malignancy suspected. Staging evaluation. * Tracking Code: BO * EXAM: CT ABDOMEN AND PELVIS WITH CONTRAST TECHNIQUE: Multidetector CT imaging of the abdomen and pelvis was performed using the standard protocol following bolus administration of intravenous contrast. RADIATION DOSE REDUCTION: This exam was performed according to the departmental dose-optimization program which includes automated exposure control, adjustment of the mA and/or kV according to patient size and/or use of iterative reconstruction technique. CONTRAST:  OMNIPAQUE IOHEXOL 300 MG/ML  SOLN COMPARISON:  09/19/2017 unenhanced CT abdomen/pelvis. FINDINGS: Lower chest: Small dependent bilateral pleural effusions with mild dependent  bibasilar atelectasis. Hepatobiliary: Normal liver size. At least 3 scattered subcentimeter hypodense liver lesions, too small to characterize, not appreciably changed from 09/19/2017 CT and considered benign. Normal gallbladder with no radiopaque cholelithiasis. No biliary ductal dilatation. CBD diameter 5 mm. Pancreas: Normal, with no mass or duct dilation. Spleen: Normal size. No mass. Adrenals/Urinary Tract: Normal adrenals. No hydronephrosis. Several subcentimeter hypodense renal lesions scattered in both kidneys, too small to characterize, for which no follow-up imaging is recommended. Normal bladder. Stomach/Bowel: Moderate to large hiatal hernia. Stomach is nondistended and otherwise grossly normal. There is an eccentric solid slightly hypoenhancing duodenal mass in the infra-ampullary descending duodenum measuring approximately 4.2 x 2.9 x 3.8 cm (series 2/image 36). Small to moderate left inguinal hernia contains a small bowel loop and a small amount of trapped ascitic fluid. No dilated or thick-walled small bowel loops. No focal small bowel caliber transition. Oral contrast transits to the left colon. Appendix not discretely visualized. Moderate sigmoid diverticulosis with no large bowel wall thickening or significant pericolonic fat stranding. Vascular/Lymphatic: Atherosclerotic nonaneurysmal abdominal aorta. Patent portal, splenic, hepatic and renal veins. Mildly enlarged 1.1 cm central mesenteric node (series 2/image 25). No additional pathologically enlarged lymph nodes in the abdomen or pelvis. Reproductive: Status post hysterectomy, with no abnormal findings at the vaginal cuff. No adnexal mass. Other: No pneumoperitoneum, ascites or focal fluid collection. Musculoskeletal: No aggressive appearing focal osseous lesions. Marked multilevel lumbar degenerative disc disease. IMPRESSION: 1. Eccentric solid hypoenhancing duodenal mass in the infra-ampullary descending duodenum measuring approximately 4.2  x 2.9 x 3.8 cm, suspicious for primary duodenal malignancy. 2. Mildly enlarged 1.1 cm central mesenteric node, potentially a nodal metastasis. No additional findings of metastatic disease in the abdomen or pelvis. 3. Small to moderate left inguinal hernia contains a small bowel loop and a small amount of trapped ascitic fluid. No evidence of acute bowel complication. 4. Moderate to large hiatal hernia. 5. Moderate sigmoid diverticulosis. 6. Small dependent bilateral pleural effusions. 7.  Aortic Atherosclerosis (ICD10-I70.0). Electronically Signed   By: Delbert Phenix M.D.   On: 10/26/2023 13:25   ECHOCARDIOGRAM COMPLETE  Result Date: 10/24/2023    ECHOCARDIOGRAM REPORT   Patient Name:   DARRAH DREDGE Date of Exam: 10/24/2023 Medical Rec #:  [id number]      Height:       57.0 in Accession #:    [id number]     Weight:       119.3 lb Date of Birth:  12/25/1935     BSA:          1.444 m Patient Age:    86 years       BP:  108/46 mmHg Patient Gender: F              HR:           73 bpm. Exam Location:  Inpatient Procedure: 2D Echo, Color Doppler and Cardiac Doppler Indications:    Aortic Valve Disorder I35.9  History:        Patient has prior history of Echocardiogram examinations, most                 recent 08/13/2022. Aortic Valve Disease; Risk                 Factors:Hypertension and Dyslipidemia.  Sonographer:    Harriette Bouillon RDCS Referring Phys: [id number] CAROLE N HALL IMPRESSIONS  1. Proximal septal thickening with SAM and narrow LVOT; turbulence noted with peak velocity 3.3 m/s (obstructive physiology). Previous study from 08/13/22 reviewed and appears similar (peak LVOT gradient 5.3 m/s; obstruction appears to be at LVOT level and not aortic stenosis as reported).  2. Left ventricular ejection fraction, by estimation, is 60 to 65%. The left ventricle has normal function. The left ventricle has no regional wall motion abnormalities. There is mild concentric left ventricular hypertrophy. Left ventricular  diastolic parameters are consistent with Grade I diastolic dysfunction (impaired relaxation). Elevated left atrial pressure.  3. Right ventricular systolic function is normal. The right ventricular size is normal. There is mildly elevated pulmonary artery systolic pressure.  4. Left atrial size was severely dilated.  5. The mitral valve is normal in structure. Moderate mitral valve regurgitation. No evidence of mitral stenosis.  6. The aortic valve is calcified. Aortic valve regurgitation is trivial. No aortic stenosis is present.  7. The inferior vena cava is normal in size with greater than 50% respiratory variability, suggesting right atrial pressure of 3 mmHg. FINDINGS  Left Ventricle: Left ventricular ejection fraction, by estimation, is 60 to 65%. The left ventricle has normal function. The left ventricle has no regional wall motion abnormalities. The left ventricular internal cavity size was normal in size. There is  mild concentric left ventricular hypertrophy. Left ventricular diastolic parameters are consistent with Grade I diastolic dysfunction (impaired relaxation). Elevated left atrial pressure. Right Ventricle: The right ventricular size is normal. Right ventricular systolic function is normal. There is mildly elevated pulmonary artery systolic pressure. The tricuspid regurgitant velocity is 3.05 m/s, and with an assumed right atrial pressure of 3 mmHg, the estimated right ventricular systolic pressure is 40.2 mmHg. Left Atrium: Left atrial size was severely dilated. Right Atrium: Right atrial size was normal in size. Pericardium: There is no evidence of pericardial effusion. Mitral Valve: The mitral valve is normal in structure. Mild mitral annular calcification. Moderate mitral valve regurgitation. No evidence of mitral valve stenosis. Tricuspid Valve: The tricuspid valve is normal in structure. Tricuspid valve regurgitation is mild . No evidence of tricuspid stenosis. Aortic Valve: The aortic valve  is calcified. Aortic valve regurgitation is trivial. No aortic stenosis is present. Aortic valve mean gradient measures 20.0 mmHg. Aortic valve peak gradient measures 33.8 mmHg. Pulmonic Valve: The pulmonic valve was not well visualized. Pulmonic valve regurgitation is not visualized. No evidence of pulmonic stenosis. Aorta: The aortic root is normal in size and structure. Venous: The inferior vena cava is normal in size with greater than 50% respiratory variability, suggesting right atrial pressure of 3 mmHg. IAS/Shunts: No atrial level shunt detected by color flow Doppler. Additional Comments: Proximal septal thickening with SAM and narrow LVOT; turbulence noted with peak velocity 3.3 m/s (  obstructive physiology). Previous study from 08/13/22 reviewed and appears similar (peak LVOT gradient 5.3 m/s; obstruction appears to be at LVOT level and not aortic stenosis as reported).  LEFT VENTRICLE PLAX 2D LVIDd:         3.70 cm   Diastology LVIDs:         2.70 cm   LV e' medial:    5.11 cm/s LV PW:         1.30 cm   LV E/e' medial:  28.8 LV IVS:        1.20 cm   LV e' lateral:   4.57 cm/s LVOT diam:     1.60 cm   LV E/e' lateral: 32.2 LVOT Area:     2.01 cm  RIGHT VENTRICLE             IVC RV S prime:     13.10 cm/s  IVC diam: 1.60 cm TAPSE (M-mode): 2.2 cm LEFT ATRIUM              Index        RIGHT ATRIUM           Index LA diam:        3.80 cm  2.63 cm/m   RA Area:     14.30 cm LA Vol (A2C):   102.0 ml 70.64 ml/m  RA Volume:   35.50 ml  24.59 ml/m LA Vol (A4C):   69.2 ml  47.93 ml/m LA Biplane Vol: 83.9 ml  58.11 ml/m  AORTIC VALVE AV Vmax:      290.50 cm/s AV Vmean:     211.500 cm/s AV VTI:       0.744 m AV Peak Grad: 33.8 mmHg AV Mean Grad: 20.0 mmHg  AORTA Ao Root diam: 3.30 cm MITRAL VALVE                 TRICUSPID VALVE MV Area (PHT): 3.45 cm      TR Peak grad:   37.2 mmHg MV Decel Time: 220 msec      TR Vmax:        305.00 cm/s MR Peak grad:   172.1 mmHg MR Mean grad:   113.0 mmHg   SHUNTS MR Vmax:         656.00 cm/s  Systemic Diam: 1.60 cm MR Vmean:       512.0 cm/s MR PISA:        4.02 cm MR PISA Radius: 0.80 cm MV E velocity: 147.00 cm/s MV A velocity: 147.00 cm/s MV E/A ratio:  1.00 Olga Millers MD Electronically signed by Olga Millers MD Signature Date/Time: 10/24/2023/11:34:46 AM    Final    CT Angio Chest Pulmonary Embolism (PE) W or WO Contrast  Result Date: 10/24/2023 CLINICAL DATA:  Pulmonary embolism (PE) suspected, high prob EXAM: CT ANGIOGRAPHY CHEST WITH CONTRAST TECHNIQUE: Multidetector CT imaging of the chest was performed using the standard protocol during bolus administration of intravenous contrast. Multiplanar CT image reconstructions and MIPs were obtained to evaluate the vascular anatomy. RADIATION DOSE REDUCTION: This exam was performed according to the departmental dose-optimization program which includes automated exposure control, adjustment of the mA and/or kV according to patient size and/or use of iterative reconstruction technique. CONTRAST:  80mL OMNIPAQUE IOHEXOL 350 MG/ML SOLN COMPARISON:  None Available. FINDINGS: Cardiovascular: No filling defects in the pulmonary arteries to suggest pulmonary emboli. Heart is normal size. Aorta is normal caliber. Coronary artery and aortic atherosclerosis. Mediastinum/Nodes: Left axillary enlarged lymph node measuring  12 mm in short axis diameter on image 47. No mediastinal or hilar adenopathy. Trachea and esophagus are unremarkable. Moderate-sized hiatal hernia. Left thyroid nodule measures 12 mm. Not clinically significant; no follow-up imaging recommended (ref: J Am Coll Radiol. 2015 Feb;12(2): 143-50). Lungs/Pleura: Lungs are clear. No focal airspace opacities or suspicious nodules. No effusions. Upper Abdomen: No acute findings Musculoskeletal: Chest wall soft tissues are unremarkable. No acute bony abnormality. Review of the MIP images confirms the above findings. IMPRESSION: No evidence of pulmonary embolus. No acute cardiopulmonary  disease. Moderate sized hiatal hernia. Coronary artery disease. Aortic Atherosclerosis (ICD10-I70.0). Electronically Signed   By: Charlett Nose M.D.   On: 10/24/2023 00:26   DG Chest 2 View  Result Date: 10/23/2023 CLINICAL DATA:  Shortness of breath EXAM: CHEST - 2 VIEW COMPARISON:  09/19/2017 FINDINGS: Mild central left upper lobe opacity, raising the possibility of early pneumonia. Right lung is clear. No pleural effusion or pneumothorax. The heart is normal in size.  Thoracic aortic atherosclerosis. Visualized osseous structures are within normal limits. IMPRESSION: Mild central left upper lobe opacity, raising the possibility of early pneumonia. Electronically Signed   By: Charline Bills M.D.   On: 10/23/2023 23:27     Discharge Exam: Vitals:   10/27/23 0607 10/27/23 1006  BP: 102/76 (!) 127/55  Pulse: 88 75  Resp: 17 17  Temp: 97.9 F (36.6 C) 98.1 F (36.7 C)  SpO2: 99% 97%   Vitals:   10/27/23 0103 10/27/23 0607 10/27/23 0704 10/27/23 1006  BP: 122/67 102/76  (!) 127/55  Pulse: 72 88  75  Resp: 16 17  17   Temp: 98.3 F (36.8 C) 97.9 F (36.6 C)  98.1 F (36.7 C)  TempSrc: Oral Oral  Oral  SpO2: 97% 99%  97%  Weight:   54.2 kg   Height:        General: Pt is alert, awake, not in acute distress Cardiovascular: RRR, S1/S2 +, no rubs, no gallops Respiratory: CTA bilaterally, no wheezing, no rhonchi Abdominal: Soft, NT, ND, bowel sounds + Extremities: no edema, no cyanosis    The results of significant diagnostics from this hospitalization (including imaging, microbiology, ancillary and laboratory) are listed below for reference.     Microbiology: Recent Results (from the past 240 hour(s))  MRSA Next Gen by PCR, Nasal     Status: None   Collection Time: 10/24/23  5:25 AM   Specimen: Nasal Mucosa; Nasal Swab  Result Value Ref Range Status   MRSA by PCR Next Gen NOT DETECTED NOT DETECTED Final    Comment: (NOTE) The GeneXpert MRSA Assay (FDA approved for NASAL  specimens only), is one component of a comprehensive MRSA colonization surveillance program. It is not intended to diagnose MRSA infection nor to guide or monitor treatment for MRSA infections. Test performance is not FDA approved in patients less than 30 years old. Performed at [facility], [address], [place], [place] [address]      Labs: BNP (last 3 results) Recent Labs    10/23/23 2137  BNP 177.4*   Basic Metabolic Panel: Recent Labs  Lab 10/23/23 2138 10/24/23 0325 10/25/23 1140 10/26/23 0547  NA 132* 136 136 138  K 3.5 3.3* 3.9 3.7  CL 99 108 105 107  CO2 25 25 23 23   GLUCOSE 108* 98 85 81  BUN 23 19 13 12   CREATININE 0.46 0.41* 0.53 0.47  CALCIUM 9.0 7.6* 8.2* 8.2*  MG  --  1.9  --   --  PHOS  --  3.8  --   --    Liver Function Tests: Recent Labs  Lab 10/23/23 2138  AST 20  ALT 13  ALKPHOS 57  BILITOT 0.8  PROT 6.3*  ALBUMIN 3.8   No results for input(s): "LIPASE", "AMYLASE" in the last 168 hours. No results for input(s): "AMMONIA" in the last 168 hours. CBC: Recent Labs  Lab 10/23/23 2138 10/24/23 0325 10/24/23 1051 10/24/23 1657 10/25/23 0525 10/26/23 0547  WBC 10.7* 5.8  --  7.7 6.1 5.1  NEUTROABS 9.1*  --   --  5.6 4.4  --   HGB 9.3* 7.5* 8.4* 8.7* 8.9* 9.0*  HCT 28.2* 22.7* 26.1* 25.9* 27.4* 27.1*  MCV 98.6 98.3  --  96.6 99.3 98.9  PLT 409* 266  --  254 280 290   Cardiac Enzymes: No results for input(s): "CKTOTAL", "CKMB", "CKMBINDEX", "TROPONINI" in the last 168 hours. BNP: Invalid input(s): "POCBNP" CBG: Recent Labs  Lab 10/25/23 2121 10/26/23 0740 10/26/23 1140 10/26/23 1555 10/26/23 2035  GLUCAP 100* 84 78 81 118*   D-Dimer No results for input(s): "DDIMER" in the last 72 hours. Hgb A1c No results for input(s): "HGBA1C" in the last 72 hours. Lipid Profile No results for input(s): "CHOL", "HDL", "LDLCALC", "TRIG", "CHOLHDL", "LDLDIRECT" in the last 72 hours. Thyroid function studies No  results for input(s): "TSH", "T4TOTAL", "T3FREE", "THYROIDAB" in the last 72 hours.  Invalid input(s): "FREET3" Anemia work up No results for input(s): "VITAMINB12", "FOLATE", "FERRITIN", "TIBC", "IRON", "RETICCTPCT" in the last 72 hours. Urinalysis    Component Value Date/Time   COLORURINE YELLOW 09/18/2017 2306   APPEARANCEUR HAZY (A) 09/18/2017 2306   LABSPEC 1.016 09/18/2017 2306   PHURINE 6.0 09/18/2017 2306   GLUCOSEU NEGATIVE 09/18/2017 2306   HGBUR MODERATE (A) 09/18/2017 2306   BILIRUBINUR NEGATIVE 09/18/2017 2306   KETONESUR NEGATIVE 09/18/2017 2306   PROTEINUR NEGATIVE 09/18/2017 2306   NITRITE NEGATIVE 09/18/2017 2306   LEUKOCYTESUR LARGE (A) 09/18/2017 2306   Sepsis Labs Recent Labs  Lab 10/24/23 0325 10/24/23 1657 10/25/23 0525 10/26/23 0547  WBC 5.8 7.7 6.1 5.1   Microbiology Recent Results (from the past 240 hour(s))  MRSA Next Gen by PCR, Nasal     Status: None   Collection Time: 10/24/23  5:25 AM   Specimen: Nasal Mucosa; Nasal Swab  Result Value Ref Range Status   MRSA by PCR Next Gen NOT DETECTED NOT DETECTED Final    Comment: (NOTE) The GeneXpert MRSA Assay (FDA approved for NASAL specimens only), is one component of a comprehensive MRSA colonization surveillance program. It is not intended to diagnose MRSA infection nor to guide or monitor treatment for MRSA infections. Test performance is not FDA approved in patients less than 16 years old. Performed at [facility], [address], [place], [place] [address]     FURTHER DISCHARGE INSTRUCTIONS:   Get Medicines reviewed and adjusted: Please take all your medications with you for your next visit with your Primary MD   Laboratory/radiological data: Please request your Primary MD to go over all hospital tests and procedure/radiological results at the follow up, please ask your Primary MD to get all Hospital records sent to his/her office.   In some cases, they will be blood  work, cultures and biopsy results pending at the time of your discharge. Please request that your primary care M.D. goes through all the records of your hospital data and follows up on these results.   Also Note the  following: If you experience worsening of your admission symptoms, develop shortness of breath, life threatening emergency, suicidal or homicidal thoughts you must seek medical attention immediately by calling 911 or calling your MD immediately  if symptoms less severe.   You must read complete instructions/literature along with all the possible adverse reactions/side effects for all the Medicines you take and that have been prescribed to you. Take any new Medicines after you have completely understood and accpet all the possible adverse reactions/side effects.    Do not drive when taking Pain medications or sleeping medications (Benzodaizepines)   Do not take more than prescribed Pain, Sleep and Anxiety Medications. It is not advisable to combine anxiety,sleep and pain medications without talking with your primary care practitioner   Special Instructions: If you have smoked or chewed Tobacco  in the last 2 yrs please stop smoking, stop any regular Alcohol  and or any Recreational drug use.   Wear Seat belts while driving.   Please note: You were cared for by a hospitalist during your hospital stay. Once you are discharged, your primary care physician will handle any further medical issues. Please note that NO REFILLS for any discharge medications will be authorized once you are discharged, as it is imperative that you return to your primary care physician (or establish a relationship with a primary care physician if you do not have one) for your post hospital discharge needs so that they can reassess your need for medications and monitor your lab values  Time coordinating discharge: Over 30 minutes  SIGNED:   Hughie Closs, MD  [facility] 10/27/2023, 1:23 PM *Please note  that this is a verbal dictation therefore any spelling or grammatical errors are due to the "Dragon Medical One" system interpretation. If 7PM-7AM, please contact night-coverage [email or web address]

## 2023-10-27 NOTE — Progress Notes (Signed)
   10/27/23 0903  TOC Brief Assessment  Insurance and Status Reviewed  Patient has primary care physician Yes  Home environment has been reviewed Single family home  Prior level of function: Independent  Prior/Current Home Services No current home services  Social Determinants of Health Reivew SDOH reviewed no interventions necessary  Readmission risk has been reviewed Yes  Transition of care needs no transition of care needs at this time

## 2023-10-27 NOTE — Consult Note (Addendum)
Cardiology Consultation   Patient ID: Christine Morse MRN: [id number]; DOB: [birth year]  Admit date: 10/23/2023 Date of Consult: 10/27/2023  PCP:  [name], MD   [facility] Providers Cardiologist:  Rollene Rotunda, MD   {  Patient Profile:   Christine Morse is a 87 y.o. female with a hx of mild aortic stenosis, prior upper GI bleed secondary to large 5 cm nonreducible hiatal hernia with Sheria Lang erosions on EGD in 2014, hypertension, hyperlipidemia, IDA, who is being seen 10/27/2023 for the evaluation of preoperative evaluation at the request of Dr. Doristine Mango.  History of Present Illness:   [name] is followed by Dr. Antoine Poche for mild aortic stenosis since 2015. She also had previous stress test that showed anteroseptal and apical wall motion abnormalities.  Study was felt to be low risk no further testing was performed.  Has had no symptoms or issues with this previously.  Currently patient has been admitted for exertional shortness of breath and had been complaining of melena for 2+ months.  Her hemoglobin was 7.5 so she received 1 unit of PRBC and started on IV PPI.  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  No metastases to the lungs.  Currently she has plans for a Whipple procedure.  Cardiology has been asked to preoperatively evaluate.  Overall patient does very well from a cardiac standpoint.  She lives at home and takes care of her granddaughter.  She is very functional and independent.  Ambulates without difficulties/assistance and believes that she could walk a football field length or up several flights of stairs.  She has no chest pain or shortness of breath outside of her acute anemia.  She states that she has very mild swelling in her left foot in which she takes hydrochlorothiazide but no diuretic.  She denies any PND,  orthopnea, palpitations, syncope.  CTA negative for PE.  Moderately sized hiatal hernia.  Hemoglobin 9, looks stable now.  Normal renal function.  Troponin 17>21> 36> 41. No other significant lab abnormalities.   Past Medical History:  Diagnosis Date   Anemia    Aortic Stenosis    Cyst of ovary, right 2013   "/US" (10/04/2013)   Diverticulitis    Diverticulosis    GERD (gastroesophageal reflux disease)    History of blood transfusion    "just today; my HgB is 5.1" (10/04/2013)   History of gastric ulcer    Hyperlipidemia    Hypertension    Nephrolithiasis    "passed them on my own; went away after I quit drinking tea" (10/04/2013)   Osteoporosis    Vitamin D deficiency     Past Surgical History:  Procedure Laterality Date   ABDOMINAL HYSTERECTOMY  1981   ESOPHAGOGASTRODUODENOSCOPY N/A 10/04/2013   Procedure: ESOPHAGOGASTRODUODENOSCOPY (EGD);  Surgeon: Hart Carwin, MD;  Location: [facility] ENDOSCOPY;  Service: Endoscopy;  Laterality: N/A;   LEFT OOPHORECTOMY Left 1981   MALONEY DILATION N/A 10/04/2013   Procedure: Elease Hashimoto DILATION;  Surgeon: Hart Carwin, MD;  Location: [facility] ENDOSCOPY;  Service: Endoscopy;  Laterality: N/A;    Inpatient Medications: Scheduled Meds:  Chlorhexidine Gluconate Cloth  6 each Topical Daily   ferrous sulfate  325 mg Oral Q breakfast   pantoprazole (PROTONIX) IV  40 mg Intravenous BID   rosuvastatin  5 mg Oral Daily   senna-docusate  1 tablet Oral  Daily   Continuous Infusions:  PRN Meds: acetaminophen, albuterol, melatonin, midodrine, mouth rinse, polyethylene glycol, prochlorperazine  Allergies:    Allergies  Allergen Reactions   Codeine     Social History:   Social History   Socioeconomic History   Marital status: Married    Spouse name: Not on file   Number of children: 2   Years of education: Not on file   Highest education level: Not on file  Occupational History   Occupation: Retired  Tobacco Use   Smoking status: Former     Types: Cigarettes   Smokeless tobacco: Never  Substance and Sexual Activity   Alcohol use: No   Drug use: No   Sexual activity: Never  Other Topics Concern   Not on file  Social History Narrative   Lives at home with granddaughter she raised.     Social Determinants of Health   Financial Resource Strain: Not on file  Food Insecurity: No Food Insecurity (10/24/2023)   Hunger Vital Sign    Worried About Running Out of Food in the Last Year: Never true    Ran Out of Food in the Last Year: Never true  Transportation Needs: No Transportation Needs (10/24/2023)   PRAPARE - Administrator, Civil Service (Medical): No    Lack of Transportation (Non-Medical): No  Physical Activity: Not on file  Stress: Not on file  Social Connections: Not on file  Intimate Partner Violence: Not At Risk (10/24/2023)   Humiliation, Afraid, Rape, and Kick questionnaire    Fear of Current or Ex-Partner: No    Emotionally Abused: No    Physically Abused: No    Sexually Abused: No    Family History:   Family History  Problem Relation Age of Onset   Heart failure Mother 21   Dementia Father    Ovarian cancer Sister    Colon cancer Neg Hx      ROS:  Please see the history of present illness.  All other ROS reviewed and negative.     Physical Exam/Data:   Vitals:   10/27/23 0103 10/27/23 0607 10/27/23 0704 10/27/23 1006  BP: 122/67 102/76  (!) 127/55  Pulse: 72 88  75  Resp: 16 17  17   Temp: 98.3 F (36.8 C) 97.9 F (36.6 C)  98.1 F (36.7 C)  TempSrc: Oral Oral  Oral  SpO2: 97% 99%  97%  Weight:   54.2 kg   Height:        Intake/Output Summary (Last 24 hours) at 10/27/2023 1114 Last data filed at 10/27/2023 0600 Gross per 24 hour  Intake 240 ml  Output --  Net 240 ml      10/27/2023    7:04 AM 10/26/2023    4:02 AM 10/25/2023    1:28 PM  Last 3 Weights  Weight (lbs) 119 lb 7.8 oz 120 lb 2.4 oz 119 lb 4.3 oz  Weight (kg) 54.2 kg 54.5 kg 54.1 kg     Body mass index is  25.86 kg/m.  General:  Well nourished, well developed, in no acute distress HEENT: normal Neck: no JVD Vascular: No carotid bruits; Distal pulses 2+ bilaterally Cardiac:  normal S1, S2; RRR. 3/6 murmur Lungs:  clear to auscultation bilaterally, no wheezing, rhonchi or rales.  Diminished breath sounds in the bases. Abd: soft, nontender, no hepatomegaly  Ext: no edema Musculoskeletal:  No deformities, BUE and BLE strength normal and equal Skin: warm and dry  Neuro:  CNs 2-12  intact, no focal abnormalities noted Psych:  Normal affect   EKG:  The EKG was personally reviewed and demonstrates: Normal sinus rhythm, heart rate 97.  No acute ST-T wave changes.  Incomplete right bundle branch block.  Q waves inferolaterally.  No significant changes from prior. Telemetry:  Telemetry was personally reviewed and demonstrates: Normal sinus rhythm heart rates in the 90s  Relevant CV Studies: Echocardiogram 10/24/2023 1. Proximal septal thickening with SAM and narrow LVOT; turbulence noted  with peak velocity 3.3 m/s (obstructive physiology). Previous study from  08/13/22 reviewed and appears similar (peak LVOT gradient 5.3 m/s;  obstruction appears to be at LVOT level  and not aortic stenosis as reported).   2. Left ventricular ejection fraction, by estimation, is 60 to 65%. The  left ventricle has normal function. The left ventricle has no regional  wall motion abnormalities. There is mild concentric left ventricular  hypertrophy. Left ventricular diastolic  parameters are consistent with Grade I diastolic dysfunction (impaired  relaxation). Elevated left atrial pressure.   3. Right ventricular systolic function is normal. The right ventricular  size is normal. There is mildly elevated pulmonary artery systolic  pressure.   4. Left atrial size was severely dilated.   5. The mitral valve is normal in structure. Moderate mitral valve  regurgitation. No evidence of mitral stenosis.   6. The aortic  valve is calcified. Aortic valve regurgitation is trivial.  No aortic stenosis is present.   7. The inferior vena cava is normal in size with greater than 50%  respiratory variability, suggesting right atrial pressure of 3 mmHg.   Laboratory Data:  High Sensitivity Troponin:   Recent Labs  Lab 10/23/23 2138 10/23/23 2345 10/24/23 0325 10/24/23 1051  TROPONINIHS 17 21* 36* 41*     Chemistry Recent Labs  Lab 10/24/23 0325 10/25/23 1140 10/26/23 0547  NA 136 136 138  K 3.3* 3.9 3.7  CL 108 105 107  CO2 25 23 23   GLUCOSE 98 85 81  BUN 19 13 12   CREATININE 0.41* 0.53 0.47  CALCIUM 7.6* 8.2* 8.2*  MG 1.9  --   --   GFRNONAA >60 >60 >60  ANIONGAP 3* 8 8    Recent Labs  Lab 10/23/23 2138  PROT 6.3*  ALBUMIN 3.8  AST 20  ALT 13  ALKPHOS 57  BILITOT 0.8   Lipids No results for input(s): "CHOL", "TRIG", "HDL", "LABVLDL", "LDLCALC", "CHOLHDL" in the last 168 hours.  Hematology Recent Labs  Lab 10/24/23 1657 10/25/23 0525 10/26/23 0547  WBC 7.7 6.1 5.1  RBC 2.68* 2.76* 2.74*  HGB 8.7* 8.9* 9.0*  HCT 25.9* 27.4* 27.1*  MCV 96.6 99.3 98.9  MCH 32.5 32.2 32.8  MCHC 33.6 32.5 33.2  RDW 14.7 15.0 14.8  PLT 254 280 290   Thyroid No results for input(s): "TSH", "FREET4" in the last 168 hours.  BNP Recent Labs  Lab 10/23/23 2137  BNP 177.4*    DDimer No results for input(s): "DDIMER" in the last 168 hours.   Radiology/Studies:  CT ABDOMEN PELVIS W CONTRAST  Result Date: 10/26/2023 CLINICAL DATA:  Inpatient anemia. Large duodenal mass on upper endoscopy. Malignancy suspected. Staging evaluation. * Tracking Code: BO * EXAM: CT ABDOMEN AND PELVIS WITH CONTRAST TECHNIQUE: Multidetector CT imaging of the abdomen and pelvis was performed using the standard protocol following bolus administration of intravenous contrast. RADIATION DOSE REDUCTION: This exam was performed according to the departmental dose-optimization program which includes automated exposure control,  adjustment of the  mA and/or kV according to patient size and/or use of iterative reconstruction technique. CONTRAST:  OMNIPAQUE IOHEXOL 300 MG/ML  SOLN COMPARISON:  09/19/2017 unenhanced CT abdomen/pelvis. FINDINGS: Lower chest: Small dependent bilateral pleural effusions with mild dependent bibasilar atelectasis. Hepatobiliary: Normal liver size. At least 3 scattered subcentimeter hypodense liver lesions, too small to characterize, not appreciably changed from 09/19/2017 CT and considered benign. Normal gallbladder with no radiopaque cholelithiasis. No biliary ductal dilatation. CBD diameter 5 mm. Pancreas: Normal, with no mass or duct dilation. Spleen: Normal size. No mass. Adrenals/Urinary Tract: Normal adrenals. No hydronephrosis. Several subcentimeter hypodense renal lesions scattered in both kidneys, too small to characterize, for which no follow-up imaging is recommended. Normal bladder. Stomach/Bowel: Moderate to large hiatal hernia. Stomach is nondistended and otherwise grossly normal. There is an eccentric solid slightly hypoenhancing duodenal mass in the infra-ampullary descending duodenum measuring approximately 4.2 x 2.9 x 3.8 cm (series 2/image 36). Small to moderate left inguinal hernia contains a small bowel loop and a small amount of trapped ascitic fluid. No dilated or thick-walled small bowel loops. No focal small bowel caliber transition. Oral contrast transits to the left colon. Appendix not discretely visualized. Moderate sigmoid diverticulosis with no large bowel wall thickening or significant pericolonic fat stranding. Vascular/Lymphatic: Atherosclerotic nonaneurysmal abdominal aorta. Patent portal, splenic, hepatic and renal veins. Mildly enlarged 1.1 cm central mesenteric node (series 2/image 25). No additional pathologically enlarged lymph nodes in the abdomen or pelvis. Reproductive: Status post hysterectomy, with no abnormal findings at the vaginal cuff. No adnexal mass. Other: No  pneumoperitoneum, ascites or focal fluid collection. Musculoskeletal: No aggressive appearing focal osseous lesions. Marked multilevel lumbar degenerative disc disease. IMPRESSION: 1. 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 2. Mildly enlarged 1.1 cm central mesenteric node, potentially a nodal metastasis. No additional findings of metastatic disease in the abdomen or pelvis. 3. Small to moderate left inguinal hernia contains a small bowel loop and a small amount of trapped ascitic fluid. No evidence of acute bowel complication. 4. Moderate to large hiatal hernia. 5. Moderate sigmoid diverticulosis. 6. Small dependent bilateral pleural effusions. 7.  Aortic Atherosclerosis (ICD10-I70.0). Electronically Signed   By: Delbert Phenix M.D.   On: 10/26/2023 13:25   ECHOCARDIOGRAM COMPLETE  Result Date: 10/24/2023    ECHOCARDIOGRAM REPORT   Patient Name:   Christine Morse Date of Exam: 10/24/2023 Medical Rec #:  [id number]      Height:       57.0 in Accession #:    [id number]     Weight:       119.3 lb Date of Birth:  09-19-1936     BSA:          1.444 m Patient Age:    86 years       BP:           108/46 mmHg Patient Gender: F              HR:           73 bpm. Exam Location:  Inpatient Procedure: 2D Echo, Color Doppler and Cardiac Doppler Indications:    Aortic Valve Disorder I35.9  History:        Patient has prior history of Echocardiogram examinations, most                 recent 08/13/2022. Aortic Valve Disease; Risk  Factors:Hypertension and Dyslipidemia.  Sonographer:    Harriette Bouillon RDCS Referring Phys: [id number] CAROLE N HALL IMPRESSIONS  1. Proximal septal thickening with SAM and narrow LVOT; turbulence noted with peak velocity 3.3 m/s (obstructive physiology). Previous study from 08/13/22 reviewed and appears similar (peak LVOT gradient 5.3 m/s; obstruction appears to  be at LVOT level and not aortic stenosis as reported).  2. Left ventricular ejection fraction, by estimation, is 60 to 65%. The left ventricle has normal function. The left ventricle has no regional wall motion abnormalities. There is mild concentric left ventricular hypertrophy. Left ventricular diastolic parameters are consistent with Grade I diastolic dysfunction (impaired relaxation). Elevated left atrial pressure.  3. Right ventricular systolic function is normal. The right ventricular size is normal. There is mildly elevated pulmonary artery systolic pressure.  4. Left atrial size was severely dilated.  5. The mitral valve is normal in structure. Moderate mitral valve regurgitation. No evidence of mitral stenosis.  6. The aortic valve is calcified. Aortic valve regurgitation is trivial. No aortic stenosis is present.  7. The inferior vena cava is normal in size with greater than 50% respiratory variability, suggesting right atrial pressure of 3 mmHg. FINDINGS  Left Ventricle: Left ventricular ejection fraction, by estimation, is 60 to 65%. The left ventricle has normal function. The left ventricle has no regional wall motion abnormalities. The left ventricular internal cavity size was normal in size. There is  mild concentric left ventricular hypertrophy. Left ventricular diastolic parameters are consistent with Grade I diastolic dysfunction (impaired relaxation). Elevated left atrial pressure. Right Ventricle: The right ventricular size is normal. Right ventricular systolic function is normal. There is mildly elevated pulmonary artery systolic pressure. The tricuspid regurgitant velocity is 3.05 m/s, and with an assumed right atrial pressure of 3 mmHg, the estimated right ventricular systolic pressure is 40.2 mmHg. Left Atrium: Left atrial size was severely dilated. Right Atrium: Right atrial size was normal in size. Pericardium: There is no evidence of pericardial effusion. Mitral Valve: The mitral valve is  normal in structure. Mild mitral annular calcification. Moderate mitral valve regurgitation. No evidence of mitral valve stenosis. Tricuspid Valve: The tricuspid valve is normal in structure. Tricuspid valve regurgitation is mild . No evidence of tricuspid stenosis. Aortic Valve: The aortic valve is calcified. Aortic valve regurgitation is trivial. No aortic stenosis is present. Aortic valve mean gradient measures 20.0 mmHg. Aortic valve peak gradient measures 33.8 mmHg. Pulmonic Valve: The pulmonic valve was not well visualized. Pulmonic valve regurgitation is not visualized. No evidence of pulmonic stenosis. Aorta: The aortic root is normal in size and structure. Venous: The inferior vena cava is normal in size with greater than 50% respiratory variability, suggesting right atrial pressure of 3 mmHg. IAS/Shunts: No atrial level shunt detected by color flow Doppler. Additional Comments: Proximal septal thickening with SAM and narrow LVOT; turbulence noted with peak velocity 3.3 m/s (obstructive physiology). Previous study from 08/13/22 reviewed and appears similar (peak LVOT gradient 5.3 m/s; obstruction appears to be at LVOT level and not aortic stenosis as reported).  LEFT VENTRICLE PLAX 2D LVIDd:         3.70 cm   Diastology LVIDs:         2.70 cm   LV e' medial:    5.11 cm/s LV PW:         1.30 cm   LV E/e' medial:  28.8 LV IVS:        1.20 cm   LV e' lateral:   4.57  cm/s LVOT diam:     1.60 cm   LV E/e' lateral: 32.2 LVOT Area:     2.01 cm  RIGHT VENTRICLE             IVC RV S prime:     13.10 cm/s  IVC diam: 1.60 cm TAPSE (M-mode): 2.2 cm LEFT ATRIUM              Index        RIGHT ATRIUM           Index LA diam:        3.80 cm  2.63 cm/m   RA Area:     14.30 cm LA Vol (A2C):   102.0 ml 70.64 ml/m  RA Volume:   35.50 ml  24.59 ml/m LA Vol (A4C):   69.2 ml  47.93 ml/m LA Biplane Vol: 83.9 ml  58.11 ml/m  AORTIC VALVE AV Vmax:      290.50 cm/s AV Vmean:     211.500 cm/s AV VTI:       0.744 m AV Peak Grad:  33.8 mmHg AV Mean Grad: 20.0 mmHg  AORTA Ao Root diam: 3.30 cm MITRAL VALVE                 TRICUSPID VALVE MV Area (PHT): 3.45 cm      TR Peak grad:   37.2 mmHg MV Decel Time: 220 msec      TR Vmax:        305.00 cm/s MR Peak grad:   172.1 mmHg MR Mean grad:   113.0 mmHg   SHUNTS MR Vmax:        656.00 cm/s  Systemic Diam: 1.60 cm MR Vmean:       512.0 cm/s MR PISA:        4.02 cm MR PISA Radius: 0.80 cm MV E velocity: 147.00 cm/s MV A velocity: 147.00 cm/s MV E/A ratio:  1.00 Olga Millers MD Electronically signed by Olga Millers MD Signature Date/Time: 10/24/2023/11:34:46 AM    Final    CT Angio Chest Pulmonary Embolism (PE) W or WO Contrast  Result Date: 10/24/2023 CLINICAL DATA:  Pulmonary embolism (PE) suspected, high prob EXAM: CT ANGIOGRAPHY CHEST WITH CONTRAST TECHNIQUE: Multidetector CT imaging of the chest was performed using the standard protocol during bolus administration of intravenous contrast. Multiplanar CT image reconstructions and MIPs were obtained to evaluate the vascular anatomy. RADIATION DOSE REDUCTION: This exam was performed according to the departmental dose-optimization program which includes automated exposure control, adjustment of the mA and/or kV according to patient size and/or use of iterative reconstruction technique. CONTRAST:  80mL OMNIPAQUE IOHEXOL 350 MG/ML SOLN COMPARISON:  None Available. FINDINGS: Cardiovascular: No filling defects in the pulmonary arteries to suggest pulmonary emboli. Heart is normal size. Aorta is normal caliber. Coronary artery and aortic atherosclerosis. Mediastinum/Nodes: Left axillary enlarged lymph node measuring 12 mm in short axis diameter on image 47. No mediastinal or hilar adenopathy. Trachea and esophagus are unremarkable. Moderate-sized hiatal hernia. Left thyroid nodule measures 12 mm. Not clinically significant; no follow-up imaging recommended (ref: J Am Coll Radiol. 2015 Feb;12(2): 143-50). Lungs/Pleura: Lungs are clear. No focal  airspace opacities or suspicious nodules. No effusions. Upper Abdomen: No acute findings Musculoskeletal: Chest wall soft tissues are unremarkable. No acute bony abnormality. Review of the MIP images confirms the above findings. IMPRESSION: No evidence of pulmonary embolus. No acute cardiopulmonary disease. Moderate sized hiatal hernia. Coronary artery disease. Aortic Atherosclerosis (ICD10-I70.0). Electronically Signed  By: Charlett Nose M.D.   On: 10/24/2023 00:26   DG Chest 2 View  Result Date: 10/23/2023 CLINICAL DATA:  Shortness of breath EXAM: CHEST - 2 VIEW COMPARISON:  09/19/2017 FINDINGS: Mild central left upper lobe opacity, raising the possibility of early pneumonia. Right lung is clear. No pleural effusion or pneumothorax. The heart is normal in size.  Thoracic aortic atherosclerosis. Visualized osseous structures are within normal limits. IMPRESSION: Mild central left upper lobe opacity, raising the possibility of early pneumonia. Electronically Signed   By: Charline Bills M.D.   On: 10/23/2023 23:27     Assessment and Plan:   Preoperative evaluation for Whipple procedure secondary to malignant duodenal mass with GI bleeding S/p 1 unit PRBC Echo here shows preserved 60 to 65%, normal RV function, moderate MR.  Has SAM with narrow LVOT similar from previous study with a peak velocity of 3.3.  Has stable AS documented since 2015. No aortic stenosis was noted however did show mean gradient 20, peak gradient 33.8 (previously noted to be mild to moderate on 2023 echo).  She does very well from a cardiac perspective and without any complaints.  She is functionally independent, ambulates without assistance, can complete greater than 4 METS. RCI 6.0 % 30-day risk of death, MI, or cardiac arrest.  No further testing is indicated and no further way to be optimized. Shes mild to moderate risk given advanced age, frailty, and comorbidities.  Will discuss with MD about any further  recommendations.    Risk Assessment/Risk Scores:   For questions or updates, please contact [facility] Please consult [email or web address] for contact info under    Signed, Abagail Kitchens, PA-C  10/27/2023 11:14 AM

## 2023-10-27 NOTE — Progress Notes (Addendum)
Progress Note  2 Days Post-Op  Subjective: No abdominal pain. Tolerating diet with good appetite and no nausea or emesis. No BM since PTA  Daughter beverly on speaker phone during visit  Objective: Vital signs in last 24 hours: Temp:  [97.9 F (36.6 C)-98.5 F (36.9 C)] 97.9 F (36.6 C) (11/06 0607) Pulse Rate:  [68-88] 88 (11/06 0607) Resp:  [16-22] 17 (11/06 0607) BP: (102-164)/(52-76) 102/76 (11/06 0607) SpO2:  [94 %-100 %] 99 % (11/06 0607) Weight:  [54.2 kg] 54.2 kg (11/06 0704) Last BM Date : 10/23/23  Intake/Output from previous day: 11/05 0701 - 11/06 0700 In: 240 [P.O.:240] Out: -  Intake/Output this shift: No intake/output data recorded.  PE: General: pleasant, WD, female who is laying in bed in NAD Lungs: Respiratory effort nonlabored Abd: soft, NT, ND, no masses, hernias, or organomegaly MSK: all 4 extremities are symmetrical with no cyanosis, clubbing, or edema. Skin: warm and dry  Psych: A&Ox3 with an appropriate affect.    Lab Results:  Recent Labs    10/25/23 0525 10/26/23 0547  WBC 6.1 5.1  HGB 8.9* 9.0*  HCT 27.4* 27.1*  PLT 280 290   BMET Recent Labs    10/25/23 1140 10/26/23 0547  NA 136 138  K 3.9 3.7  CL 105 107  CO2 23 23  GLUCOSE 85 81  BUN 13 12  CREATININE 0.53 0.47  CALCIUM 8.2* 8.2*   PT/INR No results for input(s): "LABPROT", "INR" in the last 72 hours. CMP     Component Value Date/Time   NA 138 10/26/2023 0547   K 3.7 10/26/2023 0547   CL 107 10/26/2023 0547   CO2 23 10/26/2023 0547   GLUCOSE 81 10/26/2023 0547   BUN 12 10/26/2023 0547   CREATININE 0.47 10/26/2023 0547   CALCIUM 8.2 (L) 10/26/2023 0547   PROT 6.3 (L) 10/23/2023 2138   ALBUMIN 3.8 10/23/2023 2138   AST 20 10/23/2023 2138   ALT 13 10/23/2023 2138   ALKPHOS 57 10/23/2023 2138   BILITOT 0.8 10/23/2023 2138   GFRNONAA >60 10/26/2023 0547   GFRAA >60 09/20/2017 0433   Lipase     Component Value Date/Time   LIPASE 22 09/18/2017 2326        Studies/Results: CT ABDOMEN PELVIS W CONTRAST  Result Date: 10/26/2023 CLINICAL DATA:  Inpatient anemia. Large duodenal mass on upper endoscopy. Malignancy suspected. Staging evaluation. * Tracking Code: BO * EXAM: CT ABDOMEN AND PELVIS WITH CONTRAST TECHNIQUE: Multidetector CT imaging of the abdomen and pelvis was performed using the standard protocol following bolus administration of intravenous contrast. RADIATION DOSE REDUCTION: This exam was performed according to the departmental dose-optimization program which includes automated exposure control, adjustment of the mA and/or kV according to patient size and/or use of iterative reconstruction technique. CONTRAST:  OMNIPAQUE IOHEXOL 300 MG/ML  SOLN COMPARISON:  09/19/2017 unenhanced CT abdomen/pelvis. FINDINGS: Lower chest: Small dependent bilateral pleural effusions with mild dependent bibasilar atelectasis. Hepatobiliary: Normal liver size. At least 3 scattered subcentimeter hypodense liver lesions, too small to characterize, not appreciably changed from 09/19/2017 CT and considered benign. Normal gallbladder with no radiopaque cholelithiasis. No biliary ductal dilatation. CBD diameter 5 mm. Pancreas: Normal, with no mass or duct dilation. Spleen: Normal size. No mass. Adrenals/Urinary Tract: Normal adrenals. No hydronephrosis. Several subcentimeter hypodense renal lesions scattered in both kidneys, too small to characterize, for which no follow-up imaging is recommended. Normal bladder. Stomach/Bowel: Moderate to large hiatal hernia. Stomach is nondistended and otherwise grossly normal.  There is an eccentric solid slightly hypoenhancing duodenal mass in the infra-ampullary descending duodenum measuring approximately 4.2 x 2.9 x 3.8 cm (series 2/image 36). Small to moderate left inguinal hernia contains a small bowel loop and a small amount of trapped ascitic fluid. No dilated or thick-walled small bowel loops. No focal small bowel caliber  transition. Oral contrast transits to the left colon. Appendix not discretely visualized. Moderate sigmoid diverticulosis with no large bowel wall thickening or significant pericolonic fat stranding. Vascular/Lymphatic: Atherosclerotic nonaneurysmal abdominal aorta. Patent portal, splenic, hepatic and renal veins. Mildly enlarged 1.1 cm central mesenteric node (series 2/image 25). No additional pathologically enlarged lymph nodes in the abdomen or pelvis. Reproductive: Status post hysterectomy, with no abnormal findings at the vaginal cuff. No adnexal mass. Other: No pneumoperitoneum, ascites or focal fluid collection. Musculoskeletal: No aggressive appearing focal osseous lesions. Marked multilevel lumbar degenerative disc disease. IMPRESSION: 1. 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 2. Mildly enlarged 1.1 cm central mesenteric node, potentially a nodal metastasis. No additional findings of metastatic disease in the abdomen or pelvis. 3. Small to moderate left inguinal hernia contains a small bowel loop and a small amount of trapped ascitic fluid. No evidence of acute bowel complication. 4. Moderate to large hiatal hernia. 5. Moderate sigmoid diverticulosis. 6. Small dependent bilateral pleural effusions. 7.  Aortic Atherosclerosis (ICD10-I70.0). Electronically Signed   By: Delbert Phenix M.D.   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 Unlikely to be able to be schedule during acute admission and at this time is not urgently indicated - patient tolerating diet and stable hemoglobin. Discussion on timing is ongoing within surgical team - given cardiac comorbidities recommend cardiac periop clearance during acute admission  FEN: heart healthy. Bowel regimen VTE: prophylaxis on hold in setting of recent GI bleeding  ID: no current abx   - per [facility] -  Hiatal hernia  Aortic stenosis Chronic HFpEF Chronic IDA HTN HLD GERD Vitamin D deficiency Osteoporosis  I reviewed hospitalist notes, last 24 h vitals and pain scores, last 48 h intake and output, last 24 h labs and trends, and last 24 h imaging results.     LOS: 3 days   Eric Form, [facility] 10/27/2023, 7:46 AM Please see Amion for pager number during day hours 7:00am-4:30pm

## 2023-10-27 NOTE — Progress Notes (Signed)
The proposed treatment discussed in conference is for discussion purpose only and is not a binding recommendation.  The patients have not been physically examined, or presented with their treatment options.  Therefore, final treatment plans cannot be decided.  

## 2023-10-28 ENCOUNTER — Other Ambulatory Visit: Payer: Self-pay | Admitting: *Deleted

## 2023-10-28 DIAGNOSIS — C170 Malignant neoplasm of duodenum: Secondary | ICD-10-CM

## 2023-10-28 LAB — CEA: CEA: 1.9 ng/mL (ref 0.0–4.7)

## 2023-10-28 NOTE — Progress Notes (Signed)
Referral for medical oncology, per pt she would like to be seen that [facility], [place]

## 2023-10-28 NOTE — Progress Notes (Signed)
Erroneous encounter

## 2023-11-01 ENCOUNTER — Other Ambulatory Visit: Payer: Self-pay | Admitting: General Surgery

## 2023-11-01 DIAGNOSIS — D500 Iron deficiency anemia secondary to blood loss (chronic): Secondary | ICD-10-CM | POA: Diagnosis not present

## 2023-11-01 DIAGNOSIS — R945 Abnormal results of liver function studies: Secondary | ICD-10-CM

## 2023-11-01 DIAGNOSIS — Z131 Encounter for screening for diabetes mellitus: Secondary | ICD-10-CM

## 2023-11-01 DIAGNOSIS — D132 Benign neoplasm of duodenum: Secondary | ICD-10-CM | POA: Diagnosis not present

## 2023-11-07 ENCOUNTER — Emergency Department (HOSPITAL_COMMUNITY): Payer: Medicare Other

## 2023-11-07 ENCOUNTER — Other Ambulatory Visit: Payer: Self-pay

## 2023-11-07 ENCOUNTER — Observation Stay (HOSPITAL_COMMUNITY)
Admission: EM | Admit: 2023-11-07 | Discharge: 2023-11-08 | Disposition: A | Discharge status: Home / Self Care | Payer: Medicare Other | Attending: Student | Admitting: Student

## 2023-11-07 ENCOUNTER — Encounter (HOSPITAL_COMMUNITY): Payer: Self-pay | Admitting: Emergency Medicine

## 2023-11-07 DIAGNOSIS — E871 Hypo-osmolality and hyponatremia: Secondary | ICD-10-CM | POA: Diagnosis not present

## 2023-11-07 DIAGNOSIS — D649 Anemia, unspecified: Secondary | ICD-10-CM | POA: Diagnosis not present

## 2023-11-07 DIAGNOSIS — D72829 Elevated white blood cell count, unspecified: Secondary | ICD-10-CM | POA: Insufficient documentation

## 2023-11-07 DIAGNOSIS — I517 Cardiomegaly: Secondary | ICD-10-CM | POA: Diagnosis not present

## 2023-11-07 DIAGNOSIS — K3189 Other diseases of stomach and duodenum: Secondary | ICD-10-CM | POA: Diagnosis present

## 2023-11-07 DIAGNOSIS — I350 Nonrheumatic aortic (valve) stenosis: Secondary | ICD-10-CM

## 2023-11-07 DIAGNOSIS — I10 Essential (primary) hypertension: Secondary | ICD-10-CM | POA: Diagnosis not present

## 2023-11-07 DIAGNOSIS — Z87891 Personal history of nicotine dependence: Secondary | ICD-10-CM | POA: Insufficient documentation

## 2023-11-07 DIAGNOSIS — I700 Atherosclerosis of aorta: Secondary | ICD-10-CM | POA: Diagnosis not present

## 2023-11-07 DIAGNOSIS — K5730 Diverticulosis of large intestine without perforation or abscess without bleeding: Secondary | ICD-10-CM | POA: Insufficient documentation

## 2023-11-07 DIAGNOSIS — Z79899 Other long term (current) drug therapy: Secondary | ICD-10-CM | POA: Insufficient documentation

## 2023-11-07 DIAGNOSIS — K922 Gastrointestinal hemorrhage, unspecified: Secondary | ICD-10-CM | POA: Diagnosis not present

## 2023-11-07 DIAGNOSIS — D500 Iron deficiency anemia secondary to blood loss (chronic): Principal | ICD-10-CM | POA: Insufficient documentation

## 2023-11-07 DIAGNOSIS — Z7982 Long term (current) use of aspirin: Secondary | ICD-10-CM | POA: Diagnosis not present

## 2023-11-07 DIAGNOSIS — K219 Gastro-esophageal reflux disease without esophagitis: Secondary | ICD-10-CM | POA: Diagnosis present

## 2023-11-07 DIAGNOSIS — E785 Hyperlipidemia, unspecified: Secondary | ICD-10-CM | POA: Diagnosis present

## 2023-11-07 DIAGNOSIS — D6489 Other specified anemias: Secondary | ICD-10-CM | POA: Diagnosis present

## 2023-11-07 DIAGNOSIS — R0602 Shortness of breath: Secondary | ICD-10-CM | POA: Diagnosis not present

## 2023-11-07 LAB — CBC
HCT: 21.1 % — ABNORMAL LOW (ref 36.0–46.0)
Hemoglobin: 6.7 g/dL — CL (ref 12.0–15.0)
MCH: 33.5 pg (ref 26.0–34.0)
MCHC: 31.8 g/dL (ref 30.0–36.0)
MCV: 105.5 fL — ABNORMAL HIGH (ref 80.0–100.0)
Platelets: 366 10*3/uL (ref 150–400)
RBC: 2 MIL/uL — ABNORMAL LOW (ref 3.87–5.11)
RDW: 17.2 % — ABNORMAL HIGH (ref 11.5–15.5)
WBC: 11.3 10*3/uL — ABNORMAL HIGH (ref 4.0–10.5)
nRBC: 0 % (ref 0.0–0.2)

## 2023-11-07 LAB — BASIC METABOLIC PANEL
Anion gap: 6 (ref 5–15)
BUN: 20 mg/dL (ref 8–23)
CO2: 25 mmol/L (ref 22–32)
Calcium: 8.2 mg/dL — ABNORMAL LOW (ref 8.9–10.3)
Chloride: 101 mmol/L (ref 98–111)
Creatinine, Ser: 0.47 mg/dL (ref 0.44–1.00)
GFR, Estimated: >60 mL/min (ref >60)
Glucose, Bld: 116 mg/dL — ABNORMAL HIGH (ref 70–99)
Potassium: 3.9 mmol/L (ref 3.5–5.1)
Sodium: 132 mmol/L — ABNORMAL LOW (ref 135–145)

## 2023-11-07 LAB — BRAIN NATRIURETIC PEPTIDE: B Natriuretic Peptide: 394.2 pg/mL — ABNORMAL HIGH (ref 0.0–100.0)

## 2023-11-07 MED ORDER — SODIUM CHLORIDE 0.9% IV SOLUTION: Freq: Once | INTRAVENOUS | Status: AC

## 2023-11-07 MED ORDER — PANTOPRAZOLE SODIUM 40 MG IV SOLR
80.0000 mg | Freq: Once | INTRAVENOUS | Status: AC
  Administered 2023-11-08: 80 mg via INTRAVENOUS
  Filled 2023-11-07: qty 20

## 2023-11-07 NOTE — ED Triage Notes (Addendum)
Pt presents for BLE edema and sob x 4 days since stopping HCTZ . Denies productive cough, fever, N/V. Endorses chest tightness with exertion. Sleeps sitting up at baseline.   Scheduled for abd surgery to address mass in December. H/o CHF

## 2023-11-07 NOTE — ED Provider Notes (Signed)
[place] EMERGENCY DEPARTMENT AT [facility] Provider Note   CSN: [id number] Arrival date & time: 11/07/23  2202     History  Chief Complaint  Patient presents with   Shortness of Breath    Christine Morse is a 87 y.o. female.  HPI    87 year old female comes in with chief complaint of shortness of breath.  Patient was recently admitted to the hospital for shortness of breath and found to have anemia, duodenal mass, hocm.  She was started on metoprolol, losartan and hydrochlorothiazide.  Patient states that over the last few days she has noted increasing leg swelling.  Additionally she is having exertional shortness of breath.  She also continues to note some dark stool. While in the hospital last time, patient required 1 unit of blood transfusion.  She had upper endoscopy done which revealed duodenal mass, possibly malignant.  Workup is still pending for the mass.  Home Medications Prior to Admission medications   Medication Sig Start Date End Date Taking? Authorizing Provider  acetaminophen (TYLENOL) 500 MG tablet Take 1,000 mg by mouth every 6 (six) hours as needed for mild pain, moderate pain or headache.   Yes [provider]  aspirin EC 81 MG tablet Take 1 tablet (81 mg total) by mouth daily. 08/23/13  Yes Andrena Mews, DO  Calcium Carbonate-Vitamin D (CALCIUM + D PO) Take 2 tablets by mouth daily.    Yes [provider]  ferrous sulfate 325 (65 FE) MG tablet Take 1 tablet (325 mg total) by mouth daily with breakfast. 10/05/13  Yes Christiane Ha, MD  losartan (COZAAR) 50 MG tablet Take 1 tablet (50 mg total) by mouth daily. 10/27/23 10/26/24 Yes Pahwani, Daleen Bo, MD  rosuvastatin (CRESTOR) 5 MG tablet Take 5 mg by mouth daily. 07/10/22  Yes [provider]  losartan (COZAAR) 100 MG tablet Take 100 mg by mouth daily. Patient not taking: Reported on 11/07/2023 06/22/22   [provider]  metoprolol succinate (TOPROL-XL) 25 MG 24  hr tablet Take 0.5 tablets (12.5 mg total) by mouth daily. Patient not taking: Reported on 11/07/2023 10/27/23 11/26/23  Hughie Closs, MD      Allergies    Codeine    Review of Systems   Review of Systems  All other systems reviewed and are negative.   Physical Exam Updated Vital Signs BP 121/70 (BP Location: Right Arm)   Pulse 90   Temp 98.2 F (36.8 C) (Oral)   Resp 20   Wt 54.4 kg   SpO2 100%   BMI 25.97 kg/m  Physical Exam Vitals and nursing note reviewed.  Constitutional:      Appearance: She is well-developed.  HENT:     Head: Atraumatic.  Neck:     Vascular: JVD present.  Cardiovascular:     Rate and Rhythm: Normal rate.     Heart sounds: Murmur heard.  Pulmonary:     Effort: Pulmonary effort is normal.     Breath sounds: No decreased breath sounds.  Musculoskeletal:     Cervical back: Normal range of motion and neck supple.  Skin:    General: Skin is warm and dry.  Neurological:     Mental Status: She is alert and oriented to person, place, and time.     ED Results / Procedures / Treatments   Labs (all labs ordered are listed, but only abnormal results are displayed) Labs Reviewed  BASIC METABOLIC PANEL - Abnormal; Notable for the following components:  Result Value   Sodium 132 (*)    Glucose, Bld 116 (*)    Calcium 8.2 (*)    All other components within normal limits  CBC - Abnormal; Notable for the following components:   WBC 11.3 (*)    RBC 2.00 (*)    Hemoglobin 6.7 (*)    HCT 21.1 (*)    MCV 105.5 (*)    RDW 17.2 (*)    All other components within normal limits  BRAIN NATRIURETIC PEPTIDE - Abnormal; Notable for the following components:   B Natriuretic Peptide 394.2 (*)    All other components within normal limits  HEPATIC FUNCTION PANEL  TYPE AND SCREEN  PREPARE RBC (CROSSMATCH)    EKG EKG Interpretation Date/Time:  Sunday November 07 2023 22:09:50 EST Ventricular Rate:  91 PR Interval:  154 QRS Duration:  104 QT  Interval:  372 QTC Calculation: 458 R Axis:   8  Text Interpretation: Sinus rhythm Probable left atrial enlargement RSR' in V1 or V2, right VCD or RVH Left ventricular hypertrophy No acute changes Confirmed by Derwood Kaplan [phone]) on 11/07/2023 11:50:15 PM  Radiology DG Chest 2 View  Result Date: 11/07/2023 CLINICAL DATA:  Shortness of breath.  Lower extremity edema. EXAM: CHEST - 2 VIEW COMPARISON:  10/23/2023 FINDINGS: Cardiomegaly. No confluent opacities, effusions or edema. No acute bony abnormality. Aortic atherosclerosis. IMPRESSION: Cardiomegaly.  No active disease. Electronically Signed   By: Charlett Nose M.D.   On: 11/07/2023 22:46    Procedures .Critical Care  Performed by: Derwood Kaplan, MD Authorized by: Derwood Kaplan, MD   Critical care provider statement:    Critical care time (minutes):  48   Critical care was necessary to treat or prevent imminent or life-threatening deterioration of the following conditions:  Circulatory failure   Critical care was time spent personally by me on the following activities:  Development of treatment plan with patient or surrogate, discussions with consultants, evaluation of patient's response to treatment, examination of patient, ordering and review of laboratory studies, ordering and review of radiographic studies, ordering and performing treatments and interventions, pulse oximetry, re-evaluation of patient's condition, review of old charts and obtaining history from patient or surrogate     Medications Ordered in ED Medications  0.9 %  sodium chloride infusion (Manually program via Guardrails IV Fluids) (has no administration in time range)    ED Course/ Medical Decision Making/ A&P                                 Medical Decision Making Amount and/or Complexity of Data Reviewed Labs: ordered. Radiology: ordered.  Risk Prescription drug management. Decision regarding hospitalization.  This patient presents to the ED  with chief complaint(s) of exertional shortness of breath with pertinent past medical history of HOCM, recent diagnosis of duodenal mass and recent admission to the hospital that required blood transfusion.The complaint involves an extensive differential diagnosis and also carries with it a high risk of complications and morbidity.    The differential diagnosis includes : Symptomatic anemia, acute CHF, medication side effects, pleural effusion, pneumonia.  The initial plan is to get basic labs and chest x-ray.  Will also get BNP.  I suspect that the leg swelling is because of medication side effect.   Additional history obtained: Additional history obtained from family Records reviewed previous admission documents  Independent labs interpretation:  The following labs were independently interpreted: Patient has  hemoglobin of 6.7.  Rest of the labs are otherwise reassuring.  Independent visualization and interpretation of imaging: - I independently visualized the following imaging with scope of interpretation limited to determining acute life threatening conditions related to emergency care: X-ray of the chest, which revealed no evidence of pulmonary edema.  Patient has cardiomegaly.  Treatment and Reassessment: Results of the ED workup discussed with the patient.  We will transfuse her 2 units of blood.  Will transfuse 1 unit over 2 hours.  Patient will need admission.  Consultation: - Consulted or discussed management/test interpretation with external professional: At this time I do not see any role for GI consultation given that the source of bleeding is likely the mass.  IV Protonix given.  Final Clinical Impression(s) / ED Diagnoses Final diagnoses:  Symptomatic anemia    Rx / DC Orders ED Discharge Orders     None         Derwood Kaplan, MD 11/07/23 2352

## 2023-11-07 NOTE — H&P (Signed)
History and Physical    Patient: Christine Morse ZOX:[id number] DOB: 1936/01/09 DOA: 11/07/2023 DOS: the patient was seen and examined on 11/07/2023 PCP: [name], MD  Patient coming from: {Point_of_Origin:[address]}  Chief Complaint:  Chief Complaint  Patient presents with   Shortness of Breath   HPI: Christine Morse is a 87 y.o. female with medical history significant of ***  Review of Systems: {ROS_Text:26778} Past Medical History:  Diagnosis Date   Anemia    Aortic Stenosis    Cyst of ovary, right 2013   "/US" (10/04/2013)   Diverticulitis    Diverticulosis    GERD (gastroesophageal reflux disease)    History of blood transfusion    "just today; my HgB is 5.1" (10/04/2013)   History of gastric ulcer    Hyperlipidemia    Hypertension    Nephrolithiasis    "passed them on my own; went away after I quit drinking tea" (10/04/2013)   Osteoporosis    Vitamin D deficiency    Past Surgical History:  Procedure Laterality Date   ABDOMINAL HYSTERECTOMY  1981   BIOPSY  10/25/2023   Procedure: BIOPSY;  Surgeon: Christine Fredrickson, MD;  Location: [facility];  Service: Gastroenterology;;   ESOPHAGOGASTRODUODENOSCOPY N/A 10/04/2013   Procedure: ESOPHAGOGASTRODUODENOSCOPY (EGD);  Surgeon: Christine Carwin, MD;  Location: [facility] END[facility]COPY;  Service: Endoscopy;  Laterality: N/A;   ESOPHAGOGASTRODUODENOSCOPY (EGD) WITH PROPOFOL N/A 10/25/2023   Procedure: ESOPHAGOGASTRODUODENOSCOPY (EGD) WITH PROPOFOL;  Surgeon: Christine Fredrickson, MD;  Location: [facility] ENDOSCOPY;  Service: Gastroenterology;  Laterality: N/A;   LEFT OOPHORECTOMY Left 1981   MALONEY DILATION N/A 10/04/2013   Procedure: Christine Morse DILATION;  Surgeon: Christine Carwin, MD;  Location: [facility] ENDOSCOPY;  Service: Endoscopy;  Laterality: N/A;   Social History:  reports that she has quit smoking. Her smoking use included cigarettes. She has never used smokeless tobacco. She reports that she does not drink alcohol and does not use drugs.  Allergies   Allergen Reactions   Codeine     Family History  Problem Relation Age of Onset   Heart failure Mother 71   Dementia Father    Ovarian cancer Sister    Colon cancer Neg Hx     Prior to Admission medications   Medication Sig Start Date End Date Taking? Authorizing Provider  acetaminophen (TYLENOL) 500 MG tablet Take 1,000 mg by mouth every 6 (six) hours as needed for mild pain, moderate pain or headache.   Yes [provider]  aspirin EC 81 MG tablet Take 1 tablet (81 mg total) by mouth daily. 08/23/13  Yes Christine Mews, DO  Calcium Carbonate-Vitamin D (CALCIUM + D PO) Take 2 tablets by mouth daily.    Yes [provider]  ferrous sulfate 325 (65 FE) MG tablet Take 1 tablet (325 mg total) by mouth daily with breakfast. 10/05/13  Yes Christine Ha, MD  losartan (COZAAR) 50 MG tablet Take 1 tablet (50 mg total) by mouth daily. 10/27/23 10/26/24 Yes Pahwani, Daleen Bo, MD  rosuvastatin (CRESTOR) 5 MG tablet Take 5 mg by mouth daily. 07/10/22  Yes [provider]  losartan (COZAAR) 100 MG tablet Take 100 mg by mouth daily. Patient not taking: Reported on 11/07/2023 06/22/22   [provider]  metoprolol succinate (TOPROL-XL) 25 MG 24 hr tablet Take 0.5 tablets (12.5 mg total) by mouth daily. Patient not taking: Reported on 11/07/2023 10/27/23 11/26/23  Hughie Closs, MD    Physical Exam: Vitals:   11/07/23 2210  BP: 121/70  Pulse: 90  Resp: 20  Temp: 98.2 F (36.8 C)  TempSrc: Oral  SpO2: 100%  Weight: 54.4 kg   *** Data Reviewed: {Tip this will not be part of the note when signed- Document your independent interpretation of telemetry tracing, EKG, lab, Radiology test or any other diagnostic tests. Add any new diagnostic test ordered today. (Optional):26781} {Results:26384}  Assessment and Plan: No notes have been filed under this hospital service. Service: Hospitalist     Advance Care Planning:   Code Status: Full Code ***  Consults:  ***  Family Communication: ***  Severity of Illness: {Observation/Inpatient:21159}  AuthorLonia Blood, MD 11/07/2023 11:47 PM  For on call review [email or web address].

## 2023-11-08 DIAGNOSIS — I10 Essential (primary) hypertension: Secondary | ICD-10-CM | POA: Diagnosis not present

## 2023-11-08 DIAGNOSIS — E871 Hypo-osmolality and hyponatremia: Secondary | ICD-10-CM

## 2023-11-08 DIAGNOSIS — K922 Gastrointestinal hemorrhage, unspecified: Secondary | ICD-10-CM | POA: Diagnosis not present

## 2023-11-08 DIAGNOSIS — K219 Gastro-esophageal reflux disease without esophagitis: Secondary | ICD-10-CM | POA: Diagnosis not present

## 2023-11-08 DIAGNOSIS — K3189 Other diseases of stomach and duodenum: Secondary | ICD-10-CM

## 2023-11-08 DIAGNOSIS — D500 Iron deficiency anemia secondary to blood loss (chronic): Secondary | ICD-10-CM | POA: Diagnosis not present

## 2023-11-08 DIAGNOSIS — D649 Anemia, unspecified: Secondary | ICD-10-CM | POA: Diagnosis not present

## 2023-11-08 DIAGNOSIS — I350 Nonrheumatic aortic (valve) stenosis: Secondary | ICD-10-CM

## 2023-11-08 LAB — HEPATIC FUNCTION PANEL
ALT: 14 U/L (ref 0–44)
AST: 19 U/L (ref 15–41)
Albumin: 2.9 g/dL — ABNORMAL LOW (ref 3.5–5.0)
Alkaline Phosphatase: 42 U/L (ref 38–126)
Bilirubin, Direct: <0.1 mg/dL (ref 0.0–0.2)
Total Bilirubin: 0.5 mg/dL (ref <1.2)
Total Protein: 4.9 g/dL — ABNORMAL LOW (ref 6.5–8.1)

## 2023-11-08 LAB — PREPARE RBC (CROSSMATCH)

## 2023-11-08 LAB — CBC
HCT: 28.1 % — ABNORMAL LOW (ref 36.0–46.0)
Hemoglobin: 9 g/dL — ABNORMAL LOW (ref 12.0–15.0)
MCH: 31.8 pg (ref 26.0–34.0)
MCHC: 32 g/dL (ref 30.0–36.0)
MCV: 99.3 fL (ref 80.0–100.0)
Platelets: 294 10*3/uL (ref 150–400)
RBC: 2.83 MIL/uL — ABNORMAL LOW (ref 3.87–5.11)
RDW: 19.7 % — ABNORMAL HIGH (ref 11.5–15.5)
WBC: 8.8 10*3/uL (ref 4.0–10.5)
nRBC: 0 % (ref 0.0–0.2)

## 2023-11-08 LAB — COMPREHENSIVE METABOLIC PANEL
ALT: 12 U/L (ref 0–44)
AST: 18 U/L (ref 15–41)
Albumin: 2.9 g/dL — ABNORMAL LOW (ref 3.5–5.0)
Alkaline Phosphatase: 41 U/L (ref 38–126)
Anion gap: 9 (ref 5–15)
BUN: 15 mg/dL (ref 8–23)
CO2: 22 mmol/L (ref 22–32)
Calcium: 8.3 mg/dL — ABNORMAL LOW (ref 8.9–10.3)
Chloride: 106 mmol/L (ref 98–111)
Creatinine, Ser: 0.36 mg/dL — ABNORMAL LOW (ref 0.44–1.00)
GFR, Estimated: >60 mL/min (ref >60)
Glucose, Bld: 113 mg/dL — ABNORMAL HIGH (ref 70–99)
Potassium: 3.6 mmol/L (ref 3.5–5.1)
Sodium: 137 mmol/L (ref 135–145)
Total Bilirubin: 1.1 mg/dL (ref <1.2)
Total Protein: 5 g/dL — ABNORMAL LOW (ref 6.5–8.1)

## 2023-11-08 MED ORDER — ONDANSETRON HCL 4 MG PO TABS: 4.0000 mg | ORAL_TABLET | Freq: Four times a day (QID) | ORAL | Status: DC | PRN

## 2023-11-08 MED ORDER — LOSARTAN POTASSIUM 50 MG PO TABS
50.0000 mg | ORAL_TABLET | Freq: Every day | ORAL | Status: DC
  Administered 2023-11-08: 50 mg via ORAL
  Filled 2023-11-08: qty 1

## 2023-11-08 MED ORDER — ROSUVASTATIN CALCIUM 5 MG PO TABS
5.0000 mg | ORAL_TABLET | Freq: Every day | ORAL | Status: DC
  Administered 2023-11-08: 5 mg via ORAL
  Filled 2023-11-08: qty 1

## 2023-11-08 MED ORDER — ONDANSETRON HCL 4 MG/2ML IJ SOLN: 4.0000 mg | Freq: Four times a day (QID) | INTRAMUSCULAR | Status: DC | PRN

## 2023-11-08 MED ORDER — FERROUS SULFATE 325 (65 FE) MG PO TABS
325.0000 mg | ORAL_TABLET | Freq: Every day | ORAL | Status: DC
  Administered 2023-11-08: 325 mg via ORAL
  Filled 2023-11-08: qty 1

## 2023-11-08 MED ORDER — PANTOPRAZOLE SODIUM 40 MG IV SOLR
40.0000 mg | Freq: Two times a day (BID) | INTRAVENOUS | Status: DC
  Administered 2023-11-08: 40 mg via INTRAVENOUS
  Filled 2023-11-08: qty 10

## 2023-11-08 MED ORDER — PANTOPRAZOLE SODIUM 40 MG PO TBEC: 40.0000 mg | DELAYED_RELEASE_TABLET | Freq: Two times a day (BID) | ORAL | 1 refills | Status: AC

## 2023-11-08 MED ORDER — ACETAMINOPHEN 500 MG PO TABS: 1000.0000 mg | ORAL_TABLET | Freq: Four times a day (QID) | ORAL | Status: DC | PRN

## 2023-11-08 NOTE — Care Management Obs Status (Signed)
MEDICARE OBSERVATION STATUS NOTIFICATION   Patient Details  Name: Christine Morse MRN: [id number] Date of Birth: 06/03/1936   Medicare Observation Status Notification Given:  Yes    Beckie Busing, RN 11/08/2023, 11:42 AM

## 2023-11-08 NOTE — Care Management CC44 (Signed)
Condition Code 44 Documentation Completed  Patient Details  Name: Christine Morse MRN: [id number] Date of Birth: 1936-01-08   Condition Code 44 given:  Yes Patient signature on Condition Code 44 notice:  Yes Documentation of 2 MD's agreement:  Yes Code 44 added to claim:  Yes    Beckie Busing, RN 11/08/2023, 12:01 PM

## 2023-11-08 NOTE — Discharge Summary (Signed)
Physician Discharge Summary  Christine Morse ZOX:[id number] DOB: 08/18/1936 DOA: 11/07/2023  PCP: [name], MD  Admit date: 11/07/2023 Discharge date: 11/08/2023 Admitted From: Home Disposition: Home Recommendations for Outpatient Follow-up:  Patient has upcoming appointment with oncology in 3 days Outpatient follow-up with PCP in 1 week Outpatient follow-up with general surgery as previously planned Check CMP, CBC and anemia panel at follow-up Please follow up on the following pending results: None  Home Health: Not indicated Equipment/Devices: Not indicated  Discharge Condition: Stable CODE STATUS: Full code  Follow-up Information     [name], MD. Schedule an appointment as soon as possible for a visit in 1 week(s).   Specialty: Family Medicine Contact information: [address] [place] [address] [phone]                 Hospital course 87 year old F with PMH of anemia/GI bleed, recent diagnosis of duodenal mass, LVOT, moderate MR, hiatal hernia, GERD, HTN, HLD and recent hospitalization from 11/2-6 for anemia felt to be due to GI bleed from duodenal mass presenting with shortness of breath, DOE, edema and melena, and admitted for symptomatic anemia in the setting of upper GI bleed.  Hemoglobin of 6.7 from 9.0 on 11/6.  2 units of blood ordered.  Patient was admitted.  The next day, hemoglobin improved to 9.0 after 2 units.  She felt well and wanted to go home.  She has upcoming appointment with hematology on 11/11/2023.  She also have upcoming appointment with general surgery next month. Recommend rechecking CBC and anemia panel outpatient.  She may benefit from iron infusion if iron level is low.  Of note, patient's blood pressure was soft even without home antihypertensive meds.  Discontinued home losartan.  She would continue low-dose Toprol-XL.  Reassess blood pressure at follow-up.  See individual problem list below for more.    Problems addressed during this hospitalization Principal Problem:   Symptomatic anemia Active Problems:   GERD   Diverticulosis of colon   Iron deficiency anemia due to chronic blood loss   Essential hypertension   Hyperlipidemia   Nonrheumatic aortic valve stenosis   Hyponatremia   Upper GI bleed             Time spent 35 minutes  Vital signs Vitals:   11/08/23 0407 11/08/23 0449 11/08/23 0522 11/08/23 0758  BP: (!) 122/48 (!) 108/53 (!) 121/55 (!) 119/57  Pulse: 76 79 78 76  Temp: 97.9 F (36.6 C) 97.9 F (36.6 C) 98.6 F (37 C) 98.1 F (36.7 C)  Resp: 20 14 18 16   Height:      Weight:      SpO2: 100% 98% 98%   TempSrc: Oral Oral Oral Oral  BMI (Calculated):         Discharge exam  GENERAL: No apparent distress.  Nontoxic. HEENT: MMM.  Vision and hearing grossly intact.  NECK: Supple.  No apparent JVD.  RESP:  No IWOB.  Fair aeration bilaterally. CVS:  RRR.  2/6 SEM mainly over apex. ABD/GI/GU: BS+. Abd soft, NTND.  MSK/EXT:  Moves extremities. No apparent deformity.  Trace BLE edema. SKIN: no apparent skin lesion or wound NEURO: Awake and alert. Oriented appropriately.  No apparent focal neuro deficit. PSYCH: Calm. Normal affect.   Discharge Instructions Discharge Instructions     Diet general   Complete by: As directed    Discharge instructions   Complete by: As directed    It has been a pleasure  taking care of you!  You were hospitalized due to shortness of breath, leg swelling and fatigue likely due to blood loss anemia.  You have been treated with blood transfusion.  We recommend stopping aspirin.  Started you on Protonix to decrease risk of bleeding.  Follow-up with your oncologist and surgeon as previously planned.  Avoid any over-the-counter pain medication other than plain Tylenol.  Please review your new medication list and the directions on your medications before you take them.   Take care,   Increase activity slowly   Complete by: As  directed       Allergies as of 11/08/2023       Reactions   Codeine         Medication List     STOP taking these medications    aspirin EC 81 MG tablet   losartan 100 MG tablet Commonly known as: COZAAR   losartan 50 MG tablet Commonly known as: Cozaar       TAKE these medications    acetaminophen 500 MG tablet Commonly known as: TYLENOL Take 1,000 mg by mouth every 6 (six) hours as needed for mild pain, moderate pain or headache.   CALCIUM + D PO Take 2 tablets by mouth daily.   ferrous sulfate 325 (65 FE) MG tablet Take 1 tablet (325 mg total) by mouth daily with breakfast.   metoprolol succinate 25 MG 24 hr tablet Commonly known as: TOPROL-XL Take 0.5 tablets (12.5 mg total) by mouth daily.   pantoprazole 40 MG tablet Commonly known as: Protonix Take 1 tablet (40 mg total) by mouth 2 (two) times daily.   rosuvastatin 5 MG tablet Commonly known as: CRESTOR Take 5 mg by mouth daily.        Consultations: ***  Procedures/Studies: ***   DG Chest 2 View  Result Date: 11/07/2023 CLINICAL DATA:  Shortness of breath.  Lower extremity edema. EXAM: CHEST - 2 VIEW COMPARISON:  10/23/2023 FINDINGS: Cardiomegaly. No confluent opacities, effusions or edema. No acute bony abnormality. Aortic atherosclerosis. IMPRESSION: Cardiomegaly.  No active disease. Electronically Signed   By: Charlett Nose M.D.   On: 11/07/2023 22:46   CT ABDOMEN PELVIS W CONTRAST  Result Date: 10/26/2023 CLINICAL DATA:  Inpatient anemia. Large duodenal mass on upper endoscopy. Malignancy suspected. Staging evaluation. * Tracking Code: BO * EXAM: CT ABDOMEN AND PELVIS WITH CONTRAST TECHNIQUE: Multidetector CT imaging of the abdomen and pelvis was performed using the standard protocol following bolus administration of intravenous contrast. RADIATION DOSE REDUCTION: This exam was performed according to the departmental dose-optimization program which includes automated exposure control,  adjustment of the mA and/or kV according to patient size and/or use of iterative reconstruction technique. CONTRAST:  OMNIPAQUE IOHEXOL 300 MG/ML  SOLN COMPARISON:  09/19/2017 unenhanced CT abdomen/pelvis. FINDINGS: Lower chest: Small dependent bilateral pleural effusions with mild dependent bibasilar atelectasis. Hepatobiliary: Normal liver size. At least 3 scattered subcentimeter hypodense liver lesions, too small to characterize, not appreciably changed from 09/19/2017 CT and considered benign. Normal gallbladder with no radiopaque cholelithiasis. No biliary ductal dilatation. CBD diameter 5 mm. Pancreas: Normal, with no mass or duct dilation. Spleen: Normal size. No mass. Adrenals/Urinary Tract: Normal adrenals. No hydronephrosis. Several subcentimeter hypodense renal lesions scattered in both kidneys, too small to characterize, for which no follow-up imaging is recommended. Normal bladder. Stomach/Bowel: Moderate to large hiatal hernia. Stomach is nondistended and otherwise grossly normal. There is an eccentric solid slightly hypoenhancing duodenal mass in the infra-ampullary descending duodenum measuring approximately  4.2 x 2.9 x 3.8 cm (series 2/image 36). Small to moderate left inguinal hernia contains a small bowel loop and a small amount of trapped ascitic fluid. No dilated or thick-walled small bowel loops. No focal small bowel caliber transition. Oral contrast transits to the left colon. Appendix not discretely visualized. Moderate sigmoid diverticulosis with no large bowel wall thickening or significant pericolonic fat stranding. Vascular/Lymphatic: Atherosclerotic nonaneurysmal abdominal aorta. Patent portal, splenic, hepatic and renal veins. Mildly enlarged 1.1 cm central mesenteric node (series 2/image 25). No additional pathologically enlarged lymph nodes in the abdomen or pelvis. Reproductive: Status post hysterectomy, with no abnormal findings at the vaginal cuff. No adnexal mass. Other: No  pneumoperitoneum, ascites or focal fluid collection. Musculoskeletal: No aggressive appearing focal osseous lesions. Marked multilevel lumbar degenerative disc disease. IMPRESSION: 1. 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 2. Mildly enlarged 1.1 cm central mesenteric node, potentially a nodal metastasis. No additional findings of metastatic disease in the abdomen or pelvis. 3. Small to moderate left inguinal hernia contains a small bowel loop and a small amount of trapped ascitic fluid. No evidence of acute bowel complication. 4. Moderate to large hiatal hernia. 5. Moderate sigmoid diverticulosis. 6. Small dependent bilateral pleural effusions. 7.  Aortic Atherosclerosis (ICD10-I70.0). Electronically Signed   By: Delbert Phenix M.D.   On: 10/26/2023 13:25   ECHOCARDIOGRAM COMPLETE  Result Date: 10/24/2023    ECHOCARDIOGRAM REPORT   Patient Name:   LENSEY ALVARADO Date of Exam: 10/24/2023 Medical Rec #:  [id number]      Height:       57.0 in Accession #:    [id number]     Weight:       119.3 lb Date of Birth:  04-10-1936     BSA:          1.444 m Patient Age:    86 years       BP:           108/46 mmHg Patient Gender: F              HR:           73 bpm. Exam Location:  Inpatient Procedure: 2D Echo, Color Doppler and Cardiac Doppler Indications:    Aortic Valve Disorder I35.9  History:        Patient has prior history of Echocardiogram examinations, most                 recent 08/13/2022. Aortic Valve Disease; Risk                 Factors:Hypertension and Dyslipidemia.  Sonographer:    Harriette Bouillon RDCS Referring Phys: [id number] CAROLE N HALL IMPRESSIONS  1. Proximal septal thickening with SAM and narrow LVOT; turbulence noted with peak velocity 3.3 m/s (obstructive physiology). Previous study from 08/13/22 reviewed and appears similar (peak LVOT gradient 5.3 m/s; obstruction appears to  be at LVOT level and not aortic stenosis as reported).  2. Left ventricular ejection fraction, by estimation, is 60 to 65%. The left ventricle has normal function. The left ventricle has no regional wall motion abnormalities. There is mild concentric left ventricular hypertrophy. Left ventricular diastolic parameters are consistent with Grade I diastolic dysfunction (impaired relaxation). Elevated left atrial pressure.  3. Right ventricular systolic function is normal. The right ventricular size is normal. There is mildly elevated pulmonary artery systolic pressure.  4. Left  atrial size was severely dilated.  5. The mitral valve is normal in structure. Moderate mitral valve regurgitation. No evidence of mitral stenosis.  6. The aortic valve is calcified. Aortic valve regurgitation is trivial. No aortic stenosis is present.  7. The inferior vena cava is normal in size with greater than 50% respiratory variability, suggesting right atrial pressure of 3 mmHg. FINDINGS  Left Ventricle: Left ventricular ejection fraction, by estimation, is 60 to 65%. The left ventricle has normal function. The left ventricle has no regional wall motion abnormalities. The left ventricular internal cavity size was normal in size. There is  mild concentric left ventricular hypertrophy. Left ventricular diastolic parameters are consistent with Grade I diastolic dysfunction (impaired relaxation). Elevated left atrial pressure. Right Ventricle: The right ventricular size is normal. Right ventricular systolic function is normal. There is mildly elevated pulmonary artery systolic pressure. The tricuspid regurgitant velocity is 3.05 m/s, and with an assumed right atrial pressure of 3 mmHg, the estimated right ventricular systolic pressure is 40.2 mmHg. Left Atrium: Left atrial size was severely dilated. Right Atrium: Right atrial size was normal in size. Pericardium: There is no evidence of pericardial effusion. Mitral Valve: The mitral valve is  normal in structure. Mild mitral annular calcification. Moderate mitral valve regurgitation. No evidence of mitral valve stenosis. Tricuspid Valve: The tricuspid valve is normal in structure. Tricuspid valve regurgitation is mild . No evidence of tricuspid stenosis. Aortic Valve: The aortic valve is calcified. Aortic valve regurgitation is trivial. No aortic stenosis is present. Aortic valve mean gradient measures 20.0 mmHg. Aortic valve peak gradient measures 33.8 mmHg. Pulmonic Valve: The pulmonic valve was not well visualized. Pulmonic valve regurgitation is not visualized. No evidence of pulmonic stenosis. Aorta: The aortic root is normal in size and structure. Venous: The inferior vena cava is normal in size with greater than 50% respiratory variability, suggesting right atrial pressure of 3 mmHg. IAS/Shunts: No atrial level shunt detected by color flow Doppler. Additional Comments: Proximal septal thickening with SAM and narrow LVOT; turbulence noted with peak velocity 3.3 m/s (obstructive physiology). Previous study from 08/13/22 reviewed and appears similar (peak LVOT gradient 5.3 m/s; obstruction appears to be at LVOT level and not aortic stenosis as reported).  LEFT VENTRICLE PLAX 2D LVIDd:         3.70 cm   Diastology LVIDs:         2.70 cm   LV e' medial:    5.11 cm/s LV PW:         1.30 cm   LV E/e' medial:  28.8 LV IVS:        1.20 cm   LV e' lateral:   4.57 cm/s LVOT diam:     1.60 cm   LV E/e' lateral: 32.2 LVOT Area:     2.01 cm  RIGHT VENTRICLE             IVC RV S prime:     13.10 cm/s  IVC diam: 1.60 cm TAPSE (M-mode): 2.2 cm LEFT ATRIUM              Index        RIGHT ATRIUM           Index LA diam:        3.80 cm  2.63 cm/m   RA Area:     14.30 cm LA Vol (A2C):   102.0 ml 70.64 ml/m  RA Volume:   35.50 ml  24.59 ml/m LA Vol (A4C):  69.2 ml  47.93 ml/m LA Biplane Vol: 83.9 ml  58.11 ml/m  AORTIC VALVE AV Vmax:      290.50 cm/s AV Vmean:     211.500 cm/s AV VTI:       0.744 m AV Peak Grad:  33.8 mmHg AV Mean Grad: 20.0 mmHg  AORTA Ao Root diam: 3.30 cm MITRAL VALVE                 TRICUSPID VALVE MV Area (PHT): 3.45 cm      TR Peak grad:   37.2 mmHg MV Decel Time: 220 msec      TR Vmax:        305.00 cm/s MR Peak grad:   172.1 mmHg MR Mean grad:   113.0 mmHg   SHUNTS MR Vmax:        656.00 cm/s  Systemic Diam: 1.60 cm MR Vmean:       512.0 cm/s MR PISA:        4.02 cm MR PISA Radius: 0.80 cm MV E velocity: 147.00 cm/s MV A velocity: 147.00 cm/s MV E/A ratio:  1.00 Olga Millers MD Electronically signed by Olga Millers MD Signature Date/Time: 10/24/2023/11:34:46 AM    Final    CT Angio Chest Pulmonary Embolism (PE) W or WO Contrast  Result Date: 10/24/2023 CLINICAL DATA:  Pulmonary embolism (PE) suspected, high prob EXAM: CT ANGIOGRAPHY CHEST WITH CONTRAST TECHNIQUE: Multidetector CT imaging of the chest was performed using the standard protocol during bolus administration of intravenous contrast. Multiplanar CT image reconstructions and MIPs were obtained to evaluate the vascular anatomy. RADIATION DOSE REDUCTION: This exam was performed according to the departmental dose-optimization program which includes automated exposure control, adjustment of the mA and/or kV according to patient size and/or use of iterative reconstruction technique. CONTRAST:  80mL OMNIPAQUE IOHEXOL 350 MG/ML SOLN COMPARISON:  None Available. FINDINGS: Cardiovascular: No filling defects in the pulmonary arteries to suggest pulmonary emboli. Heart is normal size. Aorta is normal caliber. Coronary artery and aortic atherosclerosis. Mediastinum/Nodes: Left axillary enlarged lymph node measuring 12 mm in short axis diameter on image 47. No mediastinal or hilar adenopathy. Trachea and esophagus are unremarkable. Moderate-sized hiatal hernia. Left thyroid nodule measures 12 mm. Not clinically significant; no follow-up imaging recommended (ref: J Am Coll Radiol. 2015 Feb;12(2): 143-50). Lungs/Pleura: Lungs are clear. No focal  airspace opacities or suspicious nodules. No effusions. Upper Abdomen: No acute findings Musculoskeletal: Chest wall soft tissues are unremarkable. No acute bony abnormality. Review of the MIP images confirms the above findings. IMPRESSION: No evidence of pulmonary embolus. No acute cardiopulmonary disease. Moderate sized hiatal hernia. Coronary artery disease. Aortic Atherosclerosis (ICD10-I70.0). Electronically Signed   By: Charlett Nose M.D.   On: 10/24/2023 00:26   DG Chest 2 View  Result Date: 10/23/2023 CLINICAL DATA:  Shortness of breath EXAM: CHEST - 2 VIEW COMPARISON:  09/19/2017 FINDINGS: Mild central left upper lobe opacity, raising the possibility of early pneumonia. Right lung is clear. No pleural effusion or pneumothorax. The heart is normal in size.  Thoracic aortic atherosclerosis. Visualized osseous structures are within normal limits. IMPRESSION: Mild central left upper lobe opacity, raising the possibility of early pneumonia. Electronically Signed   By: Charline Bills M.D.   On: 10/23/2023 23:27       The results of significant diagnostics from this hospitalization (including imaging, microbiology, ancillary and laboratory) are listed below for reference.     Microbiology: No results found for this or any previous visit (from the  past 240 hour(s)).   Labs:  CBC: Recent Labs  Lab 11/07/23 2230 11/08/23 0948  WBC 11.3* 8.8  HGB 6.7* 9.0*  HCT 21.1* 28.1*  MCV 105.5* 99.3  PLT 366 294   BMP &GFR Recent Labs  Lab 11/07/23 2230 11/08/23 0948  NA 132* 137  K 3.9 3.6  CL 101 106  CO2 25 22  GLUCOSE 116* 113*  BUN 20 15  CREATININE 0.47 0.36*  CALCIUM 8.2* 8.3*   Estimated Creatinine Clearance: 37.5 mL/min (A) (by C-G formula based on SCr of 0.36 mg/dL (L)). Liver & Pancreas: Recent Labs  Lab 11/07/23 2315 11/08/23 0948  AST 19 18  ALT 14 12  ALKPHOS 42 41  BILITOT 0.5 1.1  PROT 4.9* 5.0*  ALBUMIN 2.9* 2.9*   No results for input(s): "LIPASE",  "AMYLASE" in the last 168 hours. No results for input(s): "AMMONIA" in the last 168 hours. Diabetic: No results for input(s): "HGBA1C" in the last 72 hours. No results for input(s): "GLUCAP" in the last 168 hours. Cardiac Enzymes: No results for input(s): "CKTOTAL", "CKMB", "CKMBINDEX", "TROPONINI" in the last 168 hours. No results for input(s): "PROBNP" in the last 8760 hours. Coagulation Profile: No results for input(s): "INR", "PROTIME" in the last 168 hours. Thyroid Function Tests: No results for input(s): "TSH", "T4TOTAL", "FREET4", "T3FREE", "THYROIDAB" in the last 72 hours. Lipid Profile: No results for input(s): "CHOL", "HDL", "LDLCALC", "TRIG", "CHOLHDL", "LDLDIRECT" in the last 72 hours. Anemia Panel: No results for input(s): "VITAMINB12", "FOLATE", "FERRITIN", "TIBC", "IRON", "RETICCTPCT" in the last 72 hours. Urine analysis:    Component Value Date/Time   COLORURINE YELLOW 09/18/2017 2306   APPEARANCEUR HAZY (A) 09/18/2017 2306   LABSPEC 1.016 09/18/2017 2306   PHURINE 6.0 09/18/2017 2306   GLUCOSEU NEGATIVE 09/18/2017 2306   HGBUR MODERATE (A) 09/18/2017 2306   BILIRUBINUR NEGATIVE 09/18/2017 2306   KETONESUR NEGATIVE 09/18/2017 2306   PROTEINUR NEGATIVE 09/18/2017 2306   NITRITE NEGATIVE 09/18/2017 2306   LEUKOCYTESUR LARGE (A) 09/18/2017 2306   Sepsis Labs: Invalid input(s): "PROCALCITONIN", "LACTICIDVEN"   SIGNED:  Almon Hercules, MD  [facility] 11/08/2023, 11:03 PM

## 2023-11-08 NOTE — TOC Transition Note (Signed)
Transition of Care [facility]) - CM/SW Discharge Note   Patient Details  Name: Christine Morse MRN: [id number] Date of Birth: 02/25/1936  [facility] CM/SW Contact:  Beckie Busing, RN Phone Number:[phone]  11/08/2023, 12:01 PM   Clinical Narrative:    Patient with discharge orders. No TOC needs noted.         Patient Goals and CMS Choice      Discharge Placement                         Discharge Plan and Services Additional resources added to the After Visit Summary for                                       Social Determinants of Health (SDOH) Interventions SDOH Screenings   Food Insecurity: No Food Insecurity (10/24/2023)  Housing: Patient Declined (10/24/2023)  Transportation Needs: No Transportation Needs (10/24/2023)  Utilities: Not At Risk (10/24/2023)  Tobacco Use: Medium Risk (11/07/2023)     Readmission Risk Interventions    10/27/2023    9:03 AM  Readmission Risk Prevention Plan  Transportation Screening Complete  PCP or Specialist Appt within 5-7 Days Complete  Home Care Screening Complete  Medication Review (RN CM) Complete

## 2023-11-08 NOTE — ED Notes (Signed)
ED TO INPATIENT HANDOFF REPORT  ED Nurse Name and Phone #: Reatha Harps Name/Age/Gender Christine Morse 87 y.o. female Room/Bed: WA03/WA03  Code Status   Code Status: Full Code  Home/SNF/Other Home Patient oriented to: self, place, time, and situation Is this baseline? Yes   Triage Complete: Triage complete  Chief Complaint Symptomatic anemia [D64.9]  Triage Note Pt presents for BLE edema and sob x 4 days since stopping HCTZ . Denies productive cough, fever, N/V. Endorses chest tightness with exertion. Sleeps sitting up at baseline.   Scheduled for abd surgery to address mass in December. H/o CHF   Allergies Allergies  Allergen Reactions   Codeine     Level of Care/Admitting Diagnosis ED Disposition     ED Disposition  Admit   Condition  --   Comment  Hospital Area: [facility] [[id number]]  Level of Care: Med-Surg [16]  May admit patient to [facility] or [facility] if equivalent level of care is available:: Yes  Covid Evaluation: Asymptomatic - no recent exposure (last 10 days) testing not required  Diagnosis: Symptomatic anemia [6295284]  Admitting Physician: Rometta Emery [[id number]]  Attending Physician: Rometta Emery [[id number]]  Certification:: I certify this patient will need inpatient services for at least 2 midnights  Expected Medical Readiness: 11/09/2023          B Medical/Surgery History Past Medical History:  Diagnosis Date   Anemia    Aortic Stenosis    Cyst of ovary, right 2013   "/US" (10/04/2013)   Diverticulitis    Diverticulosis    GERD (gastroesophageal reflux disease)    History of blood transfusion    "just today; my HgB is 5.1" (10/04/2013)   History of gastric ulcer    Hyperlipidemia    Hypertension    Nephrolithiasis    "passed them on my own; went away after I quit drinking tea" (10/04/2013)   Osteoporosis    Vitamin D deficiency    Past Surgical History:  Procedure Laterality Date   ABDOMINAL  HYSTERECTOMY  1981   BIOPSY  10/25/2023   Procedure: BIOPSY;  Surgeon: Hilarie Fredrickson, MD;  Location: [facility];  Service: Gastroenterology;;   ESOPHAGOGASTRODUODENOSCOPY N/A 10/04/2013   Procedure: ESOPHAGOGASTRODUODENOSCOPY (EGD);  Surgeon: Hart Carwin, MD;  Location: [facility];  Service: Endoscopy;  Laterality: N/A;   ESOPHAGOGASTRODUODENOSCOPY (EGD) WITH PROPOFOL N/A 10/25/2023   Procedure: ESOPHAGOGASTRODUODENOSCOPY (EGD) WITH PROPOFOL;  Surgeon: Hilarie Fredrickson, MD;  Location: [facility];  Service: Gastroenterology;  Laterality: N/A;   LEFT OOPHORECTOMY Left 1981   MALONEY DILATION N/A 10/04/2013   Procedure: Elease Hashimoto DILATION;  Surgeon: Hart Carwin, MD;  Location: [facility];  Service: Endoscopy;  Laterality: N/A;     A IV Location/Drains/Wounds Patient Lines/Drains/Airways Status     Active Line/Drains/Airways     Name Placement date Placement time Site Days   Peripheral IV 11/07/23 20 G 1" Right Antecubital 11/07/23  2348  Antecubital  1   Peripheral IV 11/07/23 20 G Left Antecubital 11/07/23  2230  Antecubital  1            Intake/Output Last 24 hours No intake or output data in the 24 hours ending 11/08/23 0134  Labs/Imaging Results for orders placed or performed during the hospital encounter of 11/07/23 (from the past 48 hour(s))  Basic metabolic panel     Status: Abnormal   Collection Time: 11/07/23 10:30 PM  Result Value Ref Range   Sodium 132 (  L) 135 - 145 mmol/L   Potassium 3.9 3.5 - 5.1 mmol/L   Chloride 101 98 - 111 mmol/L   CO2 25 22 - 32 mmol/L   Glucose, Bld 116 (H) 70 - 99 mg/dL    Comment: Glucose reference range applies only to samples taken after fasting for at least 8 hours.   BUN 20 8 - 23 mg/dL   Creatinine, Ser 4.09 0.44 - 1.00 mg/dL   Calcium 8.2 (L) 8.9 - 10.3 mg/dL   GFR, Estimated >81 >19 mL/min    Comment: (NOTE) Calculated using the CKD-EPI Creatinine Equation (2021)    Anion gap 6 5 - 15    Comment: Performed at [facility], [address], [place], [place] [address]  CBC     Status: Abnormal   Collection Time: 11/07/23 10:30 PM  Result Value Ref Range   WBC 11.3 (H) 4.0 - 10.5 K/uL   RBC 2.00 (L) 3.87 - 5.11 MIL/uL   Hemoglobin 6.7 (LL) 12.0 - 15.0 g/dL    Comment: This critical result has verified and been called to Jacquiline Doe, RN by Kathlene November on 11 17 2024 at 2314, and has been read back.    HCT 21.1 (L) 36.0 - 46.0 %   MCV 105.5 (H) 80.0 - 100.0 fL   MCH 33.5 26.0 - 34.0 pg   MCHC 31.8 30.0 - 36.0 g/dL   RDW 95.6 (H) 21.3 - 08.6 %   Platelets 366 150 - 400 K/uL   nRBC 0.0 0.0 - 0.2 %    Comment: Performed at [facility], [address] [place], [place] [address]  Brain natriuretic peptide     Status: Abnormal   Collection Time: 11/07/23 10:30 PM  Result Value Ref Range   B Natriuretic Peptide 394.2 (H) 0.0 - 100.0 pg/mL    Comment: Performed at [facility], [address], [place], [place] [address]  Type and screen     Status: None (Preliminary result)   Collection Time: 11/07/23 11:15 PM  Result Value Ref Range   ABO/RH(D) O POS    Antibody Screen NEG    Sample Expiration 11/10/2023,2359    Unit Number [id number]    Blood Component Type RBC LR PHER1    Unit division 00    Status of Unit ISSUED    Transfusion Status OK TO TRANSFUSE    Crossmatch Result      Compatible Performed at [facility], [address] [place], [place] [address]    Unit Number [id number]    Blood Component Type RED CELLS,LR    Unit division 00    Status of Unit ALLOCATED    Transfusion Status OK TO TRANSFUSE    Crossmatch Result Compatible   Hepatic function panel     Status: Abnormal   Collection Time: 11/07/23 11:15 PM  Result Value Ref Range   Total Protein 4.9 (L) 6.5 - 8.1 g/dL   Albumin 2.9 (L) 3.5 - 5.0 g/dL   AST 19 15 - 41 U/L   ALT 14 0 - 44 U/L   Alkaline Phosphatase 42 38 - 126 U/L   Total Bilirubin 0.5  <1.2 mg/dL   Bilirubin, Direct <8.7 0.0 - 0.2 mg/dL   Indirect Bilirubin NOT CALCULATED 0.3 - 0.9 mg/dL    Comment: Performed at [facility], [address], [place], [place] [address]  Prepare RBC (crossmatch)     Status: None   Collection  Time: 11/07/23 11:39 PM  Result Value Ref Range   Order Confirmation      ORDER PROCESSED BY BLOOD BANK Performed at [facility], [address], [place], [place] [address]    DG Chest 2 View  Result Date: 11/07/2023 CLINICAL DATA:  Shortness of breath.  Lower extremity edema. EXAM: CHEST - 2 VIEW COMPARISON:  10/23/2023 FINDINGS: Cardiomegaly. No confluent opacities, effusions or edema. No acute bony abnormality. Aortic atherosclerosis. IMPRESSION: Cardiomegaly.  No active disease. Electronically Signed   By: Charlett Nose M.D.   On: 11/07/2023 22:46    Pending Labs [employer] (From admission, onward)     Start     Ordered   Signed and Held  CBC  Now then every 6 hours,   R      Signed and Held   Signed and Held  Comprehensive metabolic panel  Tomorrow morning,   R        Signed and Held            Vitals/Pain Today's Vitals   11/07/23 2210 11/08/23 0115  BP: 121/70 (!) 100/56  Pulse: 90 81  Resp: 20 (!) 21  Temp: 98.2 F (36.8 C) 98.3 F (36.8 C)  TempSrc: Oral Oral  SpO2: 100% 97%  Weight: 54.4 kg   PainSc: 0-No pain     Isolation Precautions No active isolations  Medications Medications  0.9 %  sodium chloride infusion (Manually program via Guardrails IV Fluids) (has no administration in time range)  pantoprazole (PROTONIX) injection 80 mg (has no administration in time range)    Mobility walks with person assist     Focused Assessments Cardiac Assessment Handoff:  Cardiac Rhythm: Normal sinus rhythm No results found for: "CKTOTAL", "CKMB", "CKMBINDEX", "TROPONINI" No results found for: "DDIMER" Does the Patient currently have chest pain? No    R Recommendations:  See Admitting Provider Note  Report given to:   Additional Notes: Currently getting first unit of blood. Has another unit ordered after first unit completes.

## 2023-11-08 NOTE — Plan of Care (Signed)

## 2023-11-09 LAB — TYPE AND SCREEN
ABO/RH(D): O POS
Antibody Screen: NEGATIVE
Unit division: 0
Unit division: 0

## 2023-11-09 LAB — BPAM RBC
Blood Product Expiration Date: 202412132359
Blood Product Expiration Date: 202412142359
ISSUE DATE / TIME: 202411180111
ISSUE DATE / TIME: 202411180441
Unit Type and Rh: 5100
Unit Type and Rh: 5100

## 2023-11-11 ENCOUNTER — Encounter: Payer: Self-pay | Admitting: Hematology

## 2023-11-11 ENCOUNTER — Inpatient Hospital Stay: Payer: Medicare Other | Attending: Hematology | Admitting: Hematology

## 2023-11-11 ENCOUNTER — Inpatient Hospital Stay: Payer: Medicare Other

## 2023-11-11 VITALS — BP 151/55 | HR 78 | Temp 97.7°F | Ht <= 58 in | Wt 123.2 lb

## 2023-11-11 DIAGNOSIS — I119 Hypertensive heart disease without heart failure: Secondary | ICD-10-CM | POA: Insufficient documentation

## 2023-11-11 DIAGNOSIS — E041 Nontoxic single thyroid nodule: Secondary | ICD-10-CM | POA: Insufficient documentation

## 2023-11-11 DIAGNOSIS — K922 Gastrointestinal hemorrhage, unspecified: Secondary | ICD-10-CM

## 2023-11-11 DIAGNOSIS — I10 Essential (primary) hypertension: Secondary | ICD-10-CM

## 2023-11-11 DIAGNOSIS — C170 Malignant neoplasm of duodenum: Secondary | ICD-10-CM | POA: Insufficient documentation

## 2023-11-11 DIAGNOSIS — I2510 Atherosclerotic heart disease of native coronary artery without angina pectoris: Secondary | ICD-10-CM | POA: Diagnosis not present

## 2023-11-11 DIAGNOSIS — D500 Iron deficiency anemia secondary to blood loss (chronic): Secondary | ICD-10-CM | POA: Insufficient documentation

## 2023-11-11 DIAGNOSIS — N920 Excessive and frequent menstruation with regular cycle: Secondary | ICD-10-CM | POA: Insufficient documentation

## 2023-11-11 NOTE — Progress Notes (Signed)
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  Referring physician Dr. Donell Beers   History of Present Illness   The patient is an 87 year old female with a past medical history of a hysterectomy, hiatal hernia, and a heart murmur. She presents today for a new consult due to a recently diagnosed duodenal adenocarcinoma.  She presents to clinic with her 2 daughters.  She was referred by her surgeon Dr. Donell Beers.    The patient first noticed something was wrong approximately three to four weeks ago when she began to feel fatigued and noticed black stools. She was hospitalized twice in the past month due to symptomatic anemia and received blood transfusions during both stays. The patient reports feeling better after the transfusions but still feels a bit tired. She denies any abdominal pain or nausea but reports inconsistent appetite. The patient lives with her great-granddaughter and is able to perform all her daily activities independently. She has been taking an iron supplement for an unspecified duration due to chronic low blood counts.     During her first hospital admission, she underwent a CT chest, abdomen pelvis, which showed a 4.2 x 2.9 x 3.8 cm Didiano mass, and a 1.1 cm central mesenteric node, no distant metastasis.  EGD showed large duodenal mass involving the second portion, likely malignant.  Biopsy of the mass showed superficial fragment of the duodenal adenoma with high-grade dysplasia, highly suspicious for invasion.  Patient was seen by general surgeon Dr. Donell Beers, Whipple surgery was offered  and scheduled for November 23, 2023.      MEDICAL HISTORY:  Past Medical History:  Diagnosis Date   Anemia    Aortic Stenosis    Cyst of ovary, right 2013   "/US" (10/04/2013)   Diverticulitis    Diverticulosis    GERD (gastroesophageal reflux disease)    History of blood transfusion    "just today; my HgB is 5.1" (10/04/2013)   History of gastric ulcer    Hyperlipidemia    Hypertension    Nephrolithiasis    "passed them on my own; went away after I quit drinking tea" (10/04/2013)   Osteoporosis    Vitamin D deficiency     SURGICAL HISTORY: Past Surgical History:  Procedure Laterality Date   ABDOMINAL HYSTERECTOMY  1981   BIOPSY  10/25/2023   Procedure: BIOPSY;  Surgeon: Hilarie Fredrickson, MD;  Location: [facility];  Service: Gastroenterology;;   ESOPHAGOGASTRODUODENOSCOPY N/A 10/04/2013   Procedure: ESOPHAGOGASTRODUODENOSCOPY (EGD);  Surgeon: Hart Carwin, MD;  Location: [facility] ENDOSCOPY;  Service: Endoscopy;  Laterality: N/A;   ESOPHAGOGASTRODUODENOSCOPY (EGD) WITH PROPOFOL N/A 10/25/2023   Procedure: ESOPHAGOGASTRODUODENOSCOPY (EGD) WITH PROPOFOL;  Surgeon: Hilarie Fredrickson, MD;  Location: [facility] ENDOSCOPY;  Service: Gastroenterology;  Laterality: N/A;   LEFT OOPHORECTOMY Left 1981   MALONEY DILATION N/A 10/04/2013   Procedure: Elease Hashimoto DILATION;  Surgeon: Hart Carwin, MD;  Location: [facility] ENDOSCOPY;  Service: Endoscopy;  Laterality: N/A;    SOCIAL HISTORY: Social History   Socioeconomic History   Marital status: Widowed    Spouse name: Not on file   Number of children: 2   Years of  education: Not on file   Highest education level: Not on file  Occupational History   Occupation: Retired  Tobacco Use   Smoking status: Never   Smokeless tobacco: Never  Substance and Sexual Activity   Alcohol use: No   Drug use: No   Sexual activity: Never  Other Topics Concern   Not on file  Social History Narrative   Lives at home with granddaughter she raised.     Social Determinants  of Health   Financial Resource Strain: Not on file  Food Insecurity: No Food Insecurity (10/24/2023)   Hunger Vital Sign    Worried About Running Out of Food in the Last Year: Never true    Ran Out of Food in the Last Year: Never true  Transportation Needs: No Transportation Needs (10/24/2023)   PRAPARE - Administrator, Civil Service (Medical): No    Lack of Transportation (Non-Medical): No  Physical Activity: Not on file  Stress: Not on file  Social Connections: Not on file  Intimate Partner Violence: Not At Risk (10/24/2023)   Humiliation, Afraid, Rape, and Kick questionnaire    Fear of Current or Ex-Partner: No    Emotionally Abused: No    Physically Abused: No    Sexually Abused: No    FAMILY HISTORY: Family History  Problem Relation Age of Onset   Heart failure Mother 35   Dementia Father    Ovarian cancer Sister    Cancer Brother        LUNG CANCER   Colon cancer Neg Hx     ALLERGIES:  is allergic to codeine.  MEDICATIONS:  Current Outpatient Medications  Medication Sig Dispense Refill   acetaminophen (TYLENOL) 500 MG tablet Take 1,000 mg by mouth every 6 (six) hours as needed for mild pain, moderate pain or headache.     Calcium Carbonate-Vitamin D (CALCIUM + D PO) Take 2 tablets by mouth daily.      ferrous sulfate 325 (65 FE) MG tablet Take 1 tablet (325 mg total) by mouth daily with breakfast. 30 tablet 3   metoprolol succinate (TOPROL-XL) 25 MG 24 hr tablet Take 0.5 tablets (12.5 mg total) by mouth daily. (Patient not taking: Reported on 11/07/2023) 15 tablet 0   pantoprazole (PROTONIX) 40 MG tablet Take 1 tablet (40 mg total) by mouth 2 (two) times daily. 180 tablet 1   rosuvastatin (CRESTOR) 5 MG tablet Take 5 mg by mouth daily.     No current facility-administered medications for this visit.    REVIEW OF SYSTEMS:   Constitutional: Denies fevers, chills or abnormal night sweats Eyes: Denies blurriness of vision, double vision or watery  eyes Ears, nose, mouth, throat, and face: Denies mucositis or sore throat Respiratory: Denies cough, dyspnea or wheezes Cardiovascular: Denies palpitation, chest discomfort or lower extremity swelling Gastrointestinal:  Denies nausea, heartburn or change in bowel habits Skin: Denies abnormal skin rashes Lymphatics: Denies new lymphadenopathy or easy bruising Neurological:Denies numbness, tingling or new weaknesses Behavioral/Psych: Mood is stable, no new changes  All other systems were reviewed with the patient and are negative.  PHYSICAL EXAMINATION: ECOG PERFORMANCE STATUS: 1 - Symptomatic but completely ambulatory  Vitals:   11/11/23 1455  BP: (!) 151/55  Pulse: 78  Temp: 97.7 F (36.5 C)  SpO2: 99%   Filed Weights   11/11/23 1455  Weight: 123 lb 3.2 oz (55.9 kg)    GENERAL:alert, no distress and comfortable SKIN: skin color, texture, turgor are normal, no rashes or significant  lesions EYES: normal, conjunctiva are pink and non-injected, sclera clear OROPHARYNX:no exudate, no erythema and lips, buccal mucosa, and tongue normal  NECK: supple, thyroid normal size, non-tender, without nodularity LYMPH:  no palpable lymphadenopathy in the cervical, axillary or inguinal LUNGS: clear to auscultation and percussion with normal breathing effort HEART: regular rate & rhythm and no murmurs and no lower extremity edema ABDOMEN:abdomen soft, non-tender and normal bowel sounds Musculoskeletal:no cyanosis of digits and no clubbing  PSYCH: alert & oriented x 3 with fluent speech NEURO: no focal motor/sensory deficits  Physical Exam   CARDIOVASCULAR: Heart murmur present. ABDOMEN: No tenderness on palpation. EXTREMITIES: No edema. NEUROLOGICAL: No back pain on palpation.      LABORATORY DATA:  I have reviewed the data as listed    Latest Ref Rng & Units 11/08/2023    9:48 AM 11/07/2023   10:30 PM 10/26/2023    5:47 AM  CBC  WBC 4.0 - 10.5 K/uL 8.8  11.3  5.1   Hemoglobin  12.0 - 15.0 g/dL 9.0  6.7  9.0   Hematocrit 36.0 - 46.0 % 28.1  21.1  27.1   Platelets 150 - 400 K/uL 294  366  290     @cmpl @  RADIOGRAPHIC STUDIES: I have personally reviewed the radiological images as listed and agreed with the findings in the report. DG Chest 2 View  Result Date: 11/07/2023 CLINICAL DATA:  Shortness of breath.  Lower extremity edema. EXAM: CHEST - 2 VIEW COMPARISON:  10/23/2023 FINDINGS: Cardiomegaly. No confluent opacities, effusions or edema. No acute bony abnormality. Aortic atherosclerosis. IMPRESSION: Cardiomegaly.  No active disease. Electronically Signed   By: Charlett Nose M.D.   On: 11/07/2023 22:46   CT ABDOMEN PELVIS W CONTRAST  Result Date: 10/26/2023 CLINICAL DATA:  Inpatient anemia. Large duodenal mass on upper endoscopy. Malignancy suspected. Staging evaluation. * Tracking Code: BO * EXAM: CT ABDOMEN AND PELVIS WITH CONTRAST TECHNIQUE: Multidetector CT imaging of the abdomen and pelvis was performed using the standard protocol following bolus administration of intravenous contrast. RADIATION DOSE REDUCTION: This exam was performed according to the departmental dose-optimization program which includes automated exposure control, adjustment of the mA and/or kV according to patient size and/or use of iterative reconstruction technique. CONTRAST:  OMNIPAQUE IOHEXOL 300 MG/ML  SOLN COMPARISON:  09/19/2017 unenhanced CT abdomen/pelvis. FINDINGS: Lower chest: Small dependent bilateral pleural effusions with mild dependent bibasilar atelectasis. Hepatobiliary: Normal liver size. At least 3 scattered subcentimeter hypodense liver lesions, too small to characterize, not appreciably changed from 09/19/2017 CT and considered benign. Normal gallbladder with no radiopaque cholelithiasis. No biliary ductal dilatation. CBD diameter 5 mm. Pancreas: Normal, with no mass or duct dilation. Spleen: Normal size. No mass. Adrenals/Urinary Tract: Normal adrenals. No hydronephrosis.  Several subcentimeter hypodense renal lesions scattered in both kidneys, too small to characterize, for which no follow-up imaging is recommended. Normal bladder. Stomach/Bowel: Moderate to large hiatal hernia. Stomach is nondistended and otherwise grossly normal. There is an eccentric solid slightly hypoenhancing duodenal mass in the infra-ampullary descending duodenum measuring approximately 4.2 x 2.9 x 3.8 cm (series 2/image 36). Small to moderate left inguinal hernia contains a small bowel loop and a small amount of trapped ascitic fluid. No dilated or thick-walled small bowel loops. No focal small bowel caliber transition. Oral contrast transits to the left colon. Appendix not discretely visualized. Moderate sigmoid diverticulosis with no large bowel wall thickening or significant pericolonic fat stranding. Vascular/Lymphatic: Atherosclerotic nonaneurysmal abdominal aorta. Patent portal, splenic, hepatic and renal veins.  Mildly enlarged 1.1 cm central mesenteric node (series 2/image 25). No additional pathologically enlarged lymph nodes in the abdomen or pelvis. Reproductive: Status post hysterectomy, with no abnormal findings at the vaginal cuff. No adnexal mass. Other: No pneumoperitoneum, ascites or focal fluid collection. Musculoskeletal: No aggressive appearing focal osseous lesions. Marked multilevel lumbar degenerative disc disease. IMPRESSION: 1. 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 2. Mildly enlarged 1.1 cm central mesenteric node, potentially a nodal metastasis. No additional findings of metastatic disease in the abdomen or pelvis. 3. Small to moderate left inguinal hernia contains a small bowel loop and a small amount of trapped ascitic fluid. No evidence of acute bowel complication. 4. Moderate to large hiatal hernia. 5. Moderate sigmoid diverticulosis. 6. Small  dependent bilateral pleural effusions. 7.  Aortic Atherosclerosis (ICD10-I70.0). Electronically Signed   By: Delbert Phenix M.D.   On: 10/26/2023 13:25   ECHOCARDIOGRAM COMPLETE  Result Date: 10/24/2023    ECHOCARDIOGRAM REPORT   Patient Name:   VIRGINIE ROTAR Date of Exam: 10/24/2023 Medical Rec #:  [id number]      Height:       57.0 in Accession #:    [id number]     Weight:       119.3 lb Date of Birth:  08-08-1936     BSA:          1.444 m Patient Age:    86 years       BP:           108/46 mmHg Patient Gender: F              HR:           73 bpm. Exam Location:  Inpatient Procedure: 2D Echo, Color Doppler and Cardiac Doppler Indications:    Aortic Valve Disorder I35.9  History:        Patient has prior history of Echocardiogram examinations, most                 recent 08/13/2022. Aortic Valve Disease; Risk                 Factors:Hypertension and Dyslipidemia.  Sonographer:    Harriette Bouillon RDCS Referring Phys: [id number] CAROLE N HALL IMPRESSIONS  1. Proximal septal thickening with SAM and narrow LVOT; turbulence noted with peak velocity 3.3 m/s (obstructive physiology). Previous study from 08/13/22 reviewed and appears similar (peak LVOT gradient 5.3 m/s; obstruction appears to be at LVOT level and not aortic stenosis as reported).  2. Left ventricular ejection fraction, by estimation, is 60 to 65%. The left ventricle has normal function. The left ventricle has no regional wall motion abnormalities. There is mild concentric left ventricular hypertrophy. Left ventricular diastolic parameters are consistent with Grade I diastolic dysfunction (impaired relaxation). Elevated left atrial pressure.  3. Right ventricular systolic function is normal. The right ventricular size is normal. There is mildly elevated pulmonary artery systolic pressure.  4. Left atrial size was severely dilated.  5. The mitral valve is normal in structure. Moderate mitral valve regurgitation. No evidence of mitral stenosis.  6. The aortic  valve is calcified. Aortic valve regurgitation is trivial. No aortic stenosis is present.  7. The inferior vena cava is normal in size with greater than 50% respiratory variability, suggesting right atrial pressure of 3 mmHg. FINDINGS  Left Ventricle: Left ventricular ejection fraction, by estimation, is 60 to 65%. The  left ventricle has normal function. The left ventricle has no regional wall motion abnormalities. The left ventricular internal cavity size was normal in size. There is  mild concentric left ventricular hypertrophy. Left ventricular diastolic parameters are consistent with Grade I diastolic dysfunction (impaired relaxation). Elevated left atrial pressure. Right Ventricle: The right ventricular size is normal. Right ventricular systolic function is normal. There is mildly elevated pulmonary artery systolic pressure. The tricuspid regurgitant velocity is 3.05 m/s, and with an assumed right atrial pressure of 3 mmHg, the estimated right ventricular systolic pressure is 40.2 mmHg. Left Atrium: Left atrial size was severely dilated. Right Atrium: Right atrial size was normal in size. Pericardium: There is no evidence of pericardial effusion. Mitral Valve: The mitral valve is normal in structure. Mild mitral annular calcification. Moderate mitral valve regurgitation. No evidence of mitral valve stenosis. Tricuspid Valve: The tricuspid valve is normal in structure. Tricuspid valve regurgitation is mild . No evidence of tricuspid stenosis. Aortic Valve: The aortic valve is calcified. Aortic valve regurgitation is trivial. No aortic stenosis is present. Aortic valve mean gradient measures 20.0 mmHg. Aortic valve peak gradient measures 33.8 mmHg. Pulmonic Valve: The pulmonic valve was not well visualized. Pulmonic valve regurgitation is not visualized. No evidence of pulmonic stenosis. Aorta: The aortic root is normal in size and structure. Venous: The inferior vena cava is normal in size with greater than 50%  respiratory variability, suggesting right atrial pressure of 3 mmHg. IAS/Shunts: No atrial level shunt detected by color flow Doppler. Additional Comments: Proximal septal thickening with SAM and narrow LVOT; turbulence noted with peak velocity 3.3 m/s (obstructive physiology). Previous study from 08/13/22 reviewed and appears similar (peak LVOT gradient 5.3 m/s; obstruction appears to be at LVOT level and not aortic stenosis as reported).  LEFT VENTRICLE PLAX 2D LVIDd:         3.70 cm   Diastology LVIDs:         2.70 cm   LV e' medial:    5.11 cm/s LV PW:         1.30 cm   LV E/e' medial:  28.8 LV IVS:        1.20 cm   LV e' lateral:   4.57 cm/s LVOT diam:     1.60 cm   LV E/e' lateral: 32.2 LVOT Area:     2.01 cm  RIGHT VENTRICLE             IVC RV S prime:     13.10 cm/s  IVC diam: 1.60 cm TAPSE (M-mode): 2.2 cm LEFT ATRIUM              Index        RIGHT ATRIUM           Index LA diam:        3.80 cm  2.63 cm/m   RA Area:     14.30 cm LA Vol (A2C):   102.0 ml 70.64 ml/m  RA Volume:   35.50 ml  24.59 ml/m LA Vol (A4C):   69.2 ml  47.93 ml/m LA Biplane Vol: 83.9 ml  58.11 ml/m  AORTIC VALVE AV Vmax:      290.50 cm/s AV Vmean:     211.500 cm/s AV VTI:       0.744 m AV Peak Grad: 33.8 mmHg AV Mean Grad: 20.0 mmHg  AORTA Ao Root diam: 3.30 cm MITRAL VALVE  TRICUSPID VALVE MV Area (PHT): 3.45 cm      TR Peak grad:   37.2 mmHg MV Decel Time: 220 msec      TR Vmax:        305.00 cm/s MR Peak grad:   172.1 mmHg MR Mean grad:   113.0 mmHg   SHUNTS MR Vmax:        656.00 cm/s  Systemic Diam: 1.60 cm MR Vmean:       512.0 cm/s MR PISA:        4.02 cm MR PISA Radius: 0.80 cm MV E velocity: 147.00 cm/s MV A velocity: 147.00 cm/s MV E/A ratio:  1.00 Olga Millers MD Electronically signed by Olga Millers MD Signature Date/Time: 10/24/2023/11:34:46 AM    Final    CT Angio Chest Pulmonary Embolism (PE) W or WO Contrast  Result Date: 10/24/2023 CLINICAL DATA:  Pulmonary embolism (PE) suspected, high  prob EXAM: CT ANGIOGRAPHY CHEST WITH CONTRAST TECHNIQUE: Multidetector CT imaging of the chest was performed using the standard protocol during bolus administration of intravenous contrast. Multiplanar CT image reconstructions and MIPs were obtained to evaluate the vascular anatomy. RADIATION DOSE REDUCTION: This exam was performed according to the departmental dose-optimization program which includes automated exposure control, adjustment of the mA and/or kV according to patient size and/or use of iterative reconstruction technique. CONTRAST:  80mL OMNIPAQUE IOHEXOL 350 MG/ML SOLN COMPARISON:  None Available. FINDINGS: Cardiovascular: No filling defects in the pulmonary arteries to suggest pulmonary emboli. Heart is normal size. Aorta is normal caliber. Coronary artery and aortic atherosclerosis. Mediastinum/Nodes: Left axillary enlarged lymph node measuring 12 mm in short axis diameter on image 47. No mediastinal or hilar adenopathy. Trachea and esophagus are unremarkable. Moderate-sized hiatal hernia. Left thyroid nodule measures 12 mm. Not clinically significant; no follow-up imaging recommended (ref: J Am Coll Radiol. 2015 Feb;12(2): 143-50). Lungs/Pleura: Lungs are clear. No focal airspace opacities or suspicious nodules. No effusions. Upper Abdomen: No acute findings Musculoskeletal: Chest wall soft tissues are unremarkable. No acute bony abnormality. Review of the MIP images confirms the above findings. IMPRESSION: No evidence of pulmonary embolus. No acute cardiopulmonary disease. Moderate sized hiatal hernia. Coronary artery disease. Aortic Atherosclerosis (ICD10-I70.0). Electronically Signed   By: Charlett Nose M.D.   On: 10/24/2023 00:26   DG Chest 2 View  Result Date: 10/23/2023 CLINICAL DATA:  Shortness of breath EXAM: CHEST - 2 VIEW COMPARISON:  09/19/2017 FINDINGS: Mild central left upper lobe opacity, raising the possibility of early pneumonia. Right lung is clear. No pleural effusion or  pneumothorax. The heart is normal in size.  Thoracic aortic atherosclerosis. Visualized osseous structures are within normal limits. IMPRESSION: Mild central left upper lobe opacity, raising the possibility of early pneumonia. Electronically Signed   By: Charline Bills M.D.   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 Clinically malignant due to size and bleeding. Surgery scheduled for December 3rd to remove the tumor, part of the pancreas, and possibly lymph nodes. Discussed surgical complexity, potential need for chemotherapy post-surgery based on pathology, possible feeding tube, 4-8 week recovery, and risks including bleeding, infection, and rehabilitation. - Proceed with scheduled surgery on December 3rd - Evaluate need for chemotherapy post-surgery based on pathology results - Plan follow-up visit 3-4 weeks post-surgery to discuss further treatment.  Due to her advanced  age, she would not be a candidate for IV chemo, but oral Xeloda can be considered if appropriate.  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 Hemoglobin improved from 6.7 g/dL to 9 g/dL after three units of blood transfusion. Chronic iron deficiency with ferritin as low as 15 ng/mL. Discussed IV iron therapy benefits and risks, including potential allergic reactions and need for premedication, and faster hemoglobin improvement compared to oral iron. - Administer IV iron to improve hemoglobin levels before surgery - Schedule IV iron infusion next week, with a second dose the following week - Monitor hemoglobin and iron levels  Hiatal Hernia Hiatal hernia with previous bleeding episodes, currently managed with pantoprazole. - Continue pantoprazole as  prescribed  Hypertension Hypertension managed with metoprolol. Recent medication adjustments noted. - Continue metoprolol as prescribed - Monitor blood pressure regularly  General Health Maintenance Overall healthy and functioning well for age. No significant weight loss or appetite changes. Engages in daily activities and self-care. - Encourage regular follow-up with primary care physician - Maintain current medications and monitor for any new symptoms  Plan -Will schedule Feraheme 510 mg tomorrow and again next week - Schedule follow-up visit in the first week of January to discuss pathology results and potential need for chemotherapy.       No orders of the defined types were placed in this encounter.   All questions were answered. The patient knows to call the clinic with any problems, questions or concerns. I spent 50 minutes counseling the patient face to face. The total time spent in the appointment was 60 minutes and more than 50% was on counseling.     Malachy Mood, MD 11/11/2023 5:33 PM

## 2023-11-12 ENCOUNTER — Inpatient Hospital Stay: Payer: Medicare Other

## 2023-11-12 VITALS — BP 149/57 | HR 72 | Temp 98.2°F

## 2023-11-12 DIAGNOSIS — D500 Iron deficiency anemia secondary to blood loss (chronic): Secondary | ICD-10-CM

## 2023-11-12 DIAGNOSIS — N920 Excessive and frequent menstruation with regular cycle: Secondary | ICD-10-CM | POA: Diagnosis not present

## 2023-11-12 DIAGNOSIS — I2510 Atherosclerotic heart disease of native coronary artery without angina pectoris: Secondary | ICD-10-CM | POA: Diagnosis not present

## 2023-11-12 DIAGNOSIS — I119 Hypertensive heart disease without heart failure: Secondary | ICD-10-CM | POA: Diagnosis not present

## 2023-11-12 DIAGNOSIS — C170 Malignant neoplasm of duodenum: Secondary | ICD-10-CM | POA: Diagnosis not present

## 2023-11-12 DIAGNOSIS — E041 Nontoxic single thyroid nodule: Secondary | ICD-10-CM | POA: Diagnosis not present

## 2023-11-12 MED ORDER — SODIUM CHLORIDE 0.9 % IV SOLN: Freq: Once | INTRAVENOUS | Status: AC

## 2023-11-12 MED ORDER — FERUMOXYTOL INJECTION 510 MG/17 ML
510.0000 mg | Freq: Once | INTRAVENOUS | Status: AC
  Administered 2023-11-12: 510 mg via INTRAVENOUS
  Filled 2023-11-12: qty 510

## 2023-11-12 MED ORDER — CETIRIZINE HCL 10 MG/ML IV SOLN
10.0000 mg | Freq: Once | INTRAVENOUS | Status: AC
  Administered 2023-11-12: 10 mg via INTRAVENOUS
  Filled 2023-11-12: qty 1

## 2023-11-12 NOTE — Progress Notes (Signed)
Surgical Instructions   Your procedure is scheduled on Tuesday November 23, 2023. [facility]" at 6:30 A.M., then check in with the Admitting office. Any questions or running late day of surgery: call [phone]  Questions prior to your surgery date: call [phone], Monday-Friday, 8am-4pm. If you experience any cold or flu symptoms such as cough, fever, chills, shortness of breath, etc. between now and your scheduled surgery, please notify us at the above number.     Remember:  Do not eat or drink  after midnight the night before your surgery  Take these medicines the morning of surgery with A SIP OF WATER  metoprolol succinate (TOPROL-XL)  pantoprazole (PROTONIX)  rosuvastatin (CRESTOR)   May take these medicines IF NEEDED: acetaminophen (TYLENOL)    One week prior to surgery, STOP taking any Aspirin (unless otherwise instructed by your surgeon) Aleve, Naproxen, Ibuprofen, Motrin, Advil, Goody's, BC's, all herbal medications, fish oil, and non-prescription vitamins.                     Do NOT Smoke (Tobacco/Vaping) for 24 hours prior to your procedure.  If you use a CPAP at night, you may bring your mask/headgear for your overnight stay.   You will be asked to remove any contacts, glasses, piercing's, hearing aid's, dentures/partials prior to surgery. Please bring cases for these items if needed.    Patients discharged the day of surgery will not be allowed to drive home, and someone needs to stay with them for 24 hours.  SURGICAL WAITING ROOM VISITATION Patients may have no more than 2 support people in the waiting area - these visitors may rotate.   Pre-op nurse will coordinate an appropriate time for 1 ADULT support person, who may not rotate, to accompany patient in pre-op.  Children under the age of 49 must have an adult with them who is not the patient and must remain in the main waiting area with an adult.  If the patient needs to stay at  the hospital during part of their recovery, the visitor guidelines for inpatient rooms apply.  Please refer to the [facility] website for the visitor guidelines for any additional information.   If you received a COVID test during your pre-op visit  it is requested that you wear a mask when out in public, stay away from anyone that may not be feeling well and notify your surgeon if you develop symptoms. If you have been in contact with anyone that has tested positive in the last 10 days please notify you surgeon.      Pre-operative CHG Bathing Instructions   You can play a key role in reducing the risk of infection after surgery. Your skin needs to be as free of germs as possible. You can reduce the number of germs on your skin by washing with CHG (chlorhexidine gluconate) soap before surgery. CHG is an antiseptic soap that kills germs and continues to kill germs even after washing.   DO NOT use if you have an allergy to chlorhexidine/CHG or antibacterial soaps. If your skin becomes reddened or irritated, stop using the CHG and notify one of our RNs at [phone].              TAKE A SHOWER THE NIGHT BEFORE SURGERY AND THE DAY OF SURGERY    Please keep in mind the following:  DO NOT shave, including legs and underarms, 48 hours prior to surgery.   You may  shave your face before/day of surgery.  Place clean sheets on your bed the night before surgery Use a clean washcloth (not used since being washed) for each shower. DO NOT sleep with pet's night before surgery.  CHG Shower Instructions:  Wash your face and private area with normal soap. If you choose to wash your hair, wash first with your normal shampoo.  After you use shampoo/soap, rinse your hair and body thoroughly to remove shampoo/soap residue.  Turn the water OFF and apply half the bottle of CHG soap to a CLEAN washcloth.  Apply CHG soap ONLY FROM YOUR NECK DOWN TO YOUR TOES (washing for 3-5 minutes)  DO NOT use CHG soap on  face, private areas, open wounds, or sores.  Pay special attention to the area where your surgery is being performed.  If you are having back surgery, having someone wash your back for you may be helpful. Wait 2 minutes after CHG soap is applied, then you may rinse off the CHG soap.  Pat dry with a clean towel  Put on clean pajamas    Additional instructions for the day of surgery: DO NOT APPLY any lotions, deodorants or perfumes.   Do not wear jewelry or makeup Do not wear nail polish, gel polish, artificial nails, or any other type of covering on natural nails (fingers and toes) Do not bring valuables to the hospital. [facility] is not responsible for valuables/personal belongings. Put on clean/comfortable clothes.  Please brush your teeth.  Ask your nurse before applying any prescription medications to the skin.

## 2023-11-12 NOTE — Patient Instructions (Signed)
 Ferumoxytol Injection What is this medication? FERUMOXYTOL (FER ue MOX i tol) treats low levels of iron in your body (iron deficiency anemia). Iron is a mineral that plays an important role in making red blood cells, which carry oxygen from your lungs to the rest of your body. This medicine may be used for other purposes; ask your health care provider or pharmacist if you have questions. COMMON BRAND NAME(S): Feraheme What should I tell my care team before I take this medication? They need to know if you have any of these conditions: Anemia not caused by low iron levels High levels of iron in the blood Magnetic resonance imaging (MRI) test scheduled An unusual or allergic reaction to iron, other medications, foods, dyes, or preservatives Pregnant or trying to get pregnant Breastfeeding How should I use this medication? This medication is injected into a vein. It is given by your care team in a hospital or clinic setting. Talk to your care team the use of this medication in children. Special care may be needed. Overdosage: If you think you have taken too much of this medicine contact a poison control center or emergency room at once. NOTE: This medicine is only for you. Do not share this medicine with others. What if I miss a dose? It is important not to miss your dose. Call your care team if you are unable to keep an appointment. What may interact with this medication? Other iron products This list may not describe all possible interactions. Give your health care provider a list of all the medicines, herbs, non-prescription drugs, or dietary supplements you use. Also tell them if you smoke, drink alcohol, or use illegal drugs. Some items may interact with your medicine. What should I watch for while using this medication? Visit your care team regularly. Tell your care team if your symptoms do not start to get better or if they get worse. You may need blood work done while you are taking this  medication. You may need to follow a special diet. Talk to your care team. Foods that contain iron include: whole grains/cereals, dried fruits, beans, or peas, leafy green vegetables, and organ meats (liver, kidney). What side effects may I notice from receiving this medication? Side effects that you should report to your care team as soon as possible: Allergic reactions--skin rash, itching, hives, swelling of the face, lips, tongue, or throat Low blood pressure--dizziness, feeling faint or lightheaded, blurry vision Shortness of breath Side effects that usually do not require medical attention (report to your care team if they continue or are bothersome): Flushing Headache Joint pain Muscle pain Nausea Pain, redness, or irritation at injection site This list may not describe all possible side effects. Call your doctor for medical advice about side effects. You may report side effects to FDA at 1-800-FDA-1088. Where should I keep my medication? This medication is given in a hospital or clinic. It will not be stored at home. NOTE: This sheet is a summary. It may not cover all possible information. If you have questions about this medicine, talk to your doctor, pharmacist, or health care provider.  2024 Elsevier/Gold Standard (2023-05-14 00:00:00)

## 2023-11-12 NOTE — Progress Notes (Signed)
Pt observed for 30 minutes post Feraheme infusion. Pt tolerated Tx well w/out incident. Ambulatory to lobby with family.

## 2023-11-15 ENCOUNTER — Encounter (HOSPITAL_COMMUNITY): Payer: Self-pay

## 2023-11-15 ENCOUNTER — Encounter (HOSPITAL_COMMUNITY)
Admission: RE | Admit: 2023-11-15 | Discharge: 2023-11-15 | Disposition: A | Discharge status: Home / Self Care | Payer: Medicare Other | Source: Ambulatory Visit | Attending: General Surgery | Admitting: General Surgery

## 2023-11-15 ENCOUNTER — Other Ambulatory Visit: Payer: Self-pay

## 2023-11-15 DIAGNOSIS — Z131 Encounter for screening for diabetes mellitus: Secondary | ICD-10-CM | POA: Diagnosis not present

## 2023-11-15 DIAGNOSIS — R945 Abnormal results of liver function studies: Secondary | ICD-10-CM | POA: Diagnosis not present

## 2023-11-15 DIAGNOSIS — K219 Gastro-esophageal reflux disease without esophagitis: Secondary | ICD-10-CM | POA: Insufficient documentation

## 2023-11-15 DIAGNOSIS — I350 Nonrheumatic aortic (valve) stenosis: Secondary | ICD-10-CM | POA: Diagnosis not present

## 2023-11-15 DIAGNOSIS — K449 Diaphragmatic hernia without obstruction or gangrene: Secondary | ICD-10-CM | POA: Insufficient documentation

## 2023-11-15 DIAGNOSIS — E785 Hyperlipidemia, unspecified: Secondary | ICD-10-CM | POA: Insufficient documentation

## 2023-11-15 DIAGNOSIS — I10 Essential (primary) hypertension: Secondary | ICD-10-CM | POA: Insufficient documentation

## 2023-11-15 DIAGNOSIS — D649 Anemia, unspecified: Secondary | ICD-10-CM | POA: Insufficient documentation

## 2023-11-15 DIAGNOSIS — Z01812 Encounter for preprocedural laboratory examination: Secondary | ICD-10-CM | POA: Diagnosis not present

## 2023-11-15 DIAGNOSIS — D132 Benign neoplasm of duodenum: Secondary | ICD-10-CM | POA: Diagnosis not present

## 2023-11-15 HISTORY — DX: Unspecified osteoarthritis, unspecified site: M19.90

## 2023-11-15 HISTORY — DX: Benign neoplasm of duodenum: D13.2

## 2023-11-15 HISTORY — DX: Personal history of other diseases of the digestive system: Z87.19

## 2023-11-15 HISTORY — DX: Nonrheumatic mitral (valve) insufficiency: I34.0

## 2023-11-15 LAB — CBC WITH DIFFERENTIAL/PLATELET
Abs Immature Granulocytes: 0.02 10*3/uL (ref 0.00–0.07)
Basophils Absolute: 0.1 10*3/uL (ref 0.0–0.1)
Basophils Relative: 1 %
Eosinophils Absolute: 0.3 10*3/uL (ref 0.0–0.5)
Eosinophils Relative: 4 %
HCT: 34.4 % — ABNORMAL LOW (ref 36.0–46.0)
Hemoglobin: 10.6 g/dL — ABNORMAL LOW (ref 12.0–15.0)
Immature Granulocytes: 0 %
Lymphocytes Relative: 14 %
Lymphs Abs: 0.9 10*3/uL (ref 0.7–4.0)
MCH: 31.3 pg (ref 26.0–34.0)
MCHC: 30.8 g/dL (ref 30.0–36.0)
MCV: 101.5 fL — ABNORMAL HIGH (ref 80.0–100.0)
Monocytes Absolute: 0.4 10*3/uL (ref 0.1–1.0)
Monocytes Relative: 7 %
Neutro Abs: 4.7 10*3/uL (ref 1.7–7.7)
Neutrophils Relative %: 74 %
Platelets: 477 10*3/uL — ABNORMAL HIGH (ref 150–400)
RBC: 3.39 MIL/uL — ABNORMAL LOW (ref 3.87–5.11)
RDW: 16.3 % — ABNORMAL HIGH (ref 11.5–15.5)
WBC: 6.3 10*3/uL (ref 4.0–10.5)
nRBC: 0 % (ref 0.0–0.2)

## 2023-11-15 LAB — HEMOGLOBIN A1C
Hgb A1c MFr Bld: 3.7 % — ABNORMAL LOW (ref 4.8–5.6)
Mean Plasma Glucose: 59.49 mg/dL

## 2023-11-15 LAB — PROTIME-INR
INR: 1 (ref 0.8–1.2)
Prothrombin Time: 13 s (ref 11.4–15.2)

## 2023-11-15 LAB — COMPREHENSIVE METABOLIC PANEL
ALT: 14 U/L (ref 0–44)
AST: 22 U/L (ref 15–41)
Albumin: 3.1 g/dL — ABNORMAL LOW (ref 3.5–5.0)
Alkaline Phosphatase: 62 U/L (ref 38–126)
Anion gap: 6 (ref 5–15)
BUN: 7 mg/dL — ABNORMAL LOW (ref 8–23)
CO2: 28 mmol/L (ref 22–32)
Calcium: 8.5 mg/dL — ABNORMAL LOW (ref 8.9–10.3)
Chloride: 104 mmol/L (ref 98–111)
Creatinine, Ser: 0.58 mg/dL (ref 0.44–1.00)
GFR, Estimated: >60 mL/min (ref >60)
Glucose, Bld: 95 mg/dL (ref 70–99)
Potassium: 4 mmol/L (ref 3.5–5.1)
Sodium: 138 mmol/L (ref 135–145)
Total Bilirubin: 0.8 mg/dL (ref <1.2)
Total Protein: 5.6 g/dL — ABNORMAL LOW (ref 6.5–8.1)

## 2023-11-15 LAB — TYPE AND SCREEN
ABO/RH(D): O POS
Antibody Screen: NEGATIVE

## 2023-11-15 LAB — PREPARE RBC (CROSSMATCH)

## 2023-11-15 NOTE — Progress Notes (Signed)
PCP - Dr. Maryelizabeth Rowan Cardiologist - Dr. Sarina Ser  PPM/ICD - denies Device Orders - na Rep Notified - na  Chest x-ray - 11/07/2023 EKG - 11/20/20204 Stress Test -  ECHO - 10/24/2023 Cardiac Cath -   Sleep Study - denies CPAP - na  Non-diabetic  Last dose of GLP1 agonist- denies   GLP1 instructions: denies  Blood Thinner Instructions:denies Aspirin Instructions:states last dose was 2-3 ago  ERAS Protcol -NPO  COVID TEST- na   Anesthesia review: Yes. HTN, heart murmur, aortic stenosis, high cholesterol  Patient denies shortness of breath, fever, cough and chest pain at PAT appointment   All instructions explained to the patient, with a verbal understanding of the material. Patient agrees to go over the instructions while at home for a better understanding. Patient also instructed to self quarantine after being tested for COVID-19. The opportunity to ask questions was provided.

## 2023-11-16 ENCOUNTER — Encounter (HOSPITAL_COMMUNITY): Payer: Self-pay

## 2023-11-16 NOTE — Progress Notes (Signed)
Anesthesia Chart Review:  Case: [id number] Date/Time: 11/23/23 0815   Procedures:      LAPAROSCOPY DIAGNOSTIC - 8 HOURS ROOM 10     WHIPPLE PROCEDURE   Anesthesia type: General   Pre-op diagnosis: DUODENAL ADENOMA   Location: [facility] ROOM 10 / [facility] OR   Surgeons: Almond Lint, MD       DISCUSSION: Patient is an 86 year old female scheduled for the above procedure.  History includes never smoker, HTN, HLD, aortic stenosis, anemia, GERD, hiatal hernia, duodenal mass.   [place] admission 10/23/23 - 10/27/23 for symptomatic anemia (low HGB of 7.5) and finding of duodenal mass. She presented with 3 days history of worsening dyspnea and indigestion. She was anemic by labs and reported intermittent black stools for 2 months. She received 1 unit PRBC. GI was consulted. She underwent EGD on 10/25/23 that showed a large hiatal hernia without erosions and a large duodenal mass involving the second portion with some areas of firm superficial ulceration concerning for cancer and felt to be the source of GI blood loss. 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 General surgery consulted. Given location a Whipple procedure recommended if felt to be an appropriate surgical candidate. Cardiology was consulted given age and history of mild AS and abnormal echo findings suggesting HOCM rather than AS.   As above, in-patient preoperative cardiology evaluation on 10/27/23 by Yvonna Alanis, PA-C and Dr. Carolan Clines. 10/24/23 echo showed proximal septal thickening with SAM and narrow LVOT; turbulence noted  with peak velocity 3.3 m/s (obstructive physiology). Previous study from  08/13/22 reviewed and appears similar (peak LVOT gradient 5.3 m/s;  obstruction appears to be at LVOT level  and not aortic stenosis as reported. LVEF 60-65%, no regional wall motion abnormalities, mild concentric LVH, grade 1 diastolic dysfunction, normal RV systolic function,  mildly elevated PASO, severely dilated LA, moderate MR, trivial AI, no AS (mean gradient 20, peak gradient 33.8). Prior to admission, she was functionally independent and could achieve > 4 METS activity.  Per Dr. Carolan Clines, "Mrs. Dassow is in the hospital with a large duodenal mass and cardiology was consulted to aid with pre-op risk stratification. She has signs of HOCM on her echo. Will start her on a low dose BB. Recommend she remains well hydrated during her procedure with LVOT obstruction. Otherwise, she is acceptable cardiac risk for high risk surgery, whipple."   He H/H were stable and up to 10.6/34.4 since 11/08/23. She confirmed she remains on b-blocker and denied any new symptoms since her recent cardiology risk assessment. Anesthesia team to evaluate on the day of surgery.    VS: BP (!) 132/53   Pulse 64   Temp 36.7 C (Oral)   Ht 4\' 10"  (1.473 m)   Wt 55.4 kg   SpO2 100%   BMI 25.52 kg/m    PROVIDERS: [name], MD is PCP  Rollene Rotunda, MD is cardiologist   LABS: Labs reviewed: Acceptable for surgery. (all labs ordered are listed, but only abnormal results are displayed)  Labs Reviewed  CBC WITH DIFFERENTIAL/PLATELET - Abnormal; Notable for the following components:      Result Value   RBC 3.39 (*)    Hemoglobin 10.6 (*)    HCT 34.4 (*)    MCV 101.5 (*)    RDW 16.3 (*)    Platelets 477 (*)    All other components within normal limits  COMPREHENSIVE METABOLIC PANEL - Abnormal;  Notable for the following components:   BUN 7 (*)    Calcium 8.5 (*)    Total Protein 5.6 (*)    Albumin 3.1 (*)    All other components within normal limits  HEMOGLOBIN A1C - Abnormal; Notable for the following components:   Hgb A1c MFr Bld 3.7 (*)    All other components within normal limits  PROTIME-INR  TYPE AND SCREEN  PREPARE RBC (CROSSMATCH)     IMAGES: CXR 11/07/23: FINDINGS: Cardiomegaly. No confluent opacities, effusions or edema. No acute bony abnormality.  Aortic atherosclerosis. IMPRESSION: Cardiomegaly.  No active disease.   CT Abd/pelvis 10/26/23: IMPRESSION: 1. 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 2. Mildly enlarged 1.1 cm central mesenteric node, potentially a nodal metastasis. No additional findings of metastatic disease in the abdomen or pelvis. 3. Small to moderate left inguinal hernia contains a small bowel loop and a small amount of trapped ascitic fluid. No evidence of acute bowel complication. 4. Moderate to large hiatal hernia. 5. Moderate sigmoid diverticulosis. 6. Small dependent bilateral pleural effusions. 7.  Aortic Atherosclerosis (ICD10-I70.0).   CTA Chest 10/24/23: IMPRESSION: No evidence of pulmonary embolus. No acute cardiopulmonary disease. Moderate sized hiatal hernia. Coronary artery disease. Aortic Atherosclerosis (ICD10-I70.0).   EKG: 11/07/23: Sinus rhythm Probable left atrial enlargement RSR' in V1 or V2, right VCD or RVH Left ventricular hypertrophy No acute changes Confirmed by Derwood Kaplan [phone]) on 11/07/2023 11:50:15 PM   CV: Echo 10/24/23: IMPRESSIONS   1. Proximal septal thickening with SAM and narrow LVOT; turbulence noted  with peak velocity 3.3 m/s (obstructive physiology). Previous study from  08/13/22 reviewed and appears similar (peak LVOT gradient 5.3 m/s;  obstruction appears to be at LVOT level  and not aortic stenosis as reported).   2. Left ventricular ejection fraction, by estimation, is 60 to 65%. The  left ventricle has normal function. The left ventricle has no regional  wall motion abnormalities. There is mild concentric left ventricular  hypertrophy. Left ventricular diastolic  parameters are consistent with Grade I diastolic dysfunction (impaired  relaxation). Elevated left atrial pressure.   3. Right ventricular systolic  function is normal. The right ventricular  size is normal. There is mildly elevated pulmonary artery systolic  pressure. Estimated right ventricular systolic pressure is 40.2 mmHg.   4. Left atrial size was severely dilated.   5. The mitral valve is normal in structure. Moderate mitral valve  regurgitation. No evidence of mitral stenosis.   6. The aortic valve is calcified. Aortic valve regurgitation is trivial.  No aortic stenosis is present. Aortic valve mean gradient  measures 20.0 mmHg. Aortic valve peak gradient measures 33.8 mmHg.   7. The inferior vena cava is normal in size with greater than 50%  respiratory variability, suggesting right atrial pressure of 3 mmHg.  - Comparison echo 08/13/22: LVEF 60-65%, no RWMA, moderate LVH, grade 1 DD, normal RVSF, normal PASP, mild-moderately dilated LF, moderate MR, mild-moderate AS with mean gradient 21 mmHg, peak gradient 38.8 mmHg   Nuclear stress test 10/03/13: Overall Impression:   Intermediate risk stress nuclear study Anteroseptal and anteroapical ischemia with positive ECG response to exercise. LV Ejection Fraction: 80%.  LV Wall Motion:  NL LV Function; NL Wall Motion. - LHC was considered but found to be markedly anemic with HGB of 5 which was  felt to be a major contributor to symptoms and EKG/stress findings. 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 LHC deferred unless she had recurrent symptoms.    Past Medical History:  Diagnosis Date   Anemia    Aortic Stenosis    Arthritis    Cyst of ovary, right 2013   "/US" (10/04/2013)   Diverticulitis    Diverticulosis    Duodenal adenoma    GERD (gastroesophageal reflux disease)    History of blood transfusion    "just today; my HgB is 5.1" (10/04/2013)   History of gastric ulcer    History of hiatal hernia    HOCM (hypertrophic obstructive cardiomyopathy) (HCC) 10/24/2023   Hyperlipidemia    Hypertension    Mitral regurgitation    moderate MR 10/24/23   Nephrolithiasis    "passed  them on my own; went away after I quit drinking tea" (10/04/2013)   Osteoporosis    Vitamin D deficiency     Past Surgical History:  Procedure Laterality Date   ABDOMINAL HYSTERECTOMY  1981   BIOPSY  10/25/2023   Procedure: BIOPSY;  Surgeon: Hilarie Fredrickson, MD;  Location: [facility];  Service: Gastroenterology;;   ESOPHAGOGASTRODUODENOSCOPY N/A 10/04/2013   Procedure: ESOPHAGOGASTRODUODENOSCOPY (EGD);  Surgeon: Hart Carwin, MD;  Location: [facility] ENDOSCOPY;  Service: Endoscopy;  Laterality: N/A;   ESOPHAGOGASTRODUODENOSCOPY (EGD) WITH PROPOFOL N/A 10/25/2023   Procedure: ESOPHAGOGASTRODUODENOSCOPY (EGD) WITH PROPOFOL;  Surgeon: Hilarie Fredrickson, MD;  Location: [facility] ENDOSCOPY;  Service: Gastroenterology;  Laterality: N/A;   LEFT OOPHORECTOMY Left 1981   MALONEY DILATION N/A 10/04/2013   Procedure: Elease Hashimoto DILATION;  Surgeon: Hart Carwin, MD;  Location: [facility] ENDOSCOPY;  Service: Endoscopy;  Laterality: N/A;    MEDICATIONS:  acetaminophen (TYLENOL) 500 MG tablet   Calcium Carbonate-Vitamin D (CALCIUM + D PO)   ferrous sulfate 325 (65 FE) MG tablet   metoprolol succinate (TOPROL-XL) 25 MG 24 hr tablet   pantoprazole (PROTONIX) 40 MG tablet   rosuvastatin (CRESTOR) 5 MG tablet   No current facility-administered medications for this encounter.    Shonna Chock, PA-C Surgical Short Stay/Anesthesiology [facility] Phone [phone] [facility] Phone [phone] 11/16/2023 5:29 PM

## 2023-11-16 NOTE — Anesthesia Preprocedure Evaluation (Addendum)
Anesthesia Evaluation  Patient identified by MRN, date of birth, ID band Patient awake    Reviewed: Allergy & Precautions, NPO status , Patient's Chart, lab work & pertinent test results  History of Anesthesia Complications Negative for: history of anesthetic complications  Airway Mallampati: I  TM Distance: >3 FB Neck ROM: Full    Dental  (+) Dental Advisory Given   Pulmonary neg pulmonary ROS   breath sounds clear to auscultation       Cardiovascular hypertension, Pt. on medications (-) angina + Valvular Problems/Murmurs (mild AS, mild HOCM ( peak LVOT velocity 3.3-5.3 m/s), peak gradient 33 mmHg, mean gradient 20 mmHg) AS  Rhythm:Regular Rate:Normal + Systolic murmurs HOCM with SAM: velocity 5.54m/s vs 3.69m/s  Echo 10/24/23: IMPRESSIONS  1. Proximal septal thickening with SAM and narrow LVOT; turbulence noted  with peak velocity 3.3 m/s (obstructive physiology). Previous study from  08/13/22 reviewed and appears similar (peak LVOT gradient 5.3 m/s;  obstruction appears to be at LVOT level  and not aortic stenosis as reported).  2. Left ventricular ejection fraction, by estimation, is 60 to 65%. The  left ventricle has normal function. The left ventricle has no regional  wall motion abnormalities. There is mild concentric left ventricular  hypertrophy. Left ventricular diastolic  parameters are consistent with Grade I diastolic dysfunction (impaired  relaxation). Elevated left atrial pressure.  3. Right ventricular systolic function is normal. The right ventricular  size is normal. There is mildly elevated pulmonary artery systolic  pressure. Estimated right ventricular systolic pressure is 40.2 mmHg.  4. Left atrial size was severely dilated.  5. The mitral valve is normal in structure. Moderate mitral valve  regurgitation. No evidence of mitral stenosis.  6. The aortic valve is calcified. Aortic valve regurgitation is  trivial.  No aortic stenosis is present. Aortic valve mean gradient  measures 20.0 mmHg. Aortic valve peak gradient measures 33.8 mmHg.  7. The inferior vena cava is normal in size with greater than 50%  respiratory variability, suggesting right atrial pressure of 3 mmHg.     Neuro/Psych negative neurological ROS     GI/Hepatic Neg liver ROS, hiatal hernia, PUD,GERD  ,,Duodenal mass   Endo/Other  negative endocrine ROS    Renal/GU H/o stones     Musculoskeletal  (+) Arthritis ,    Abdominal   Peds  Hematology  (+) Blood dyscrasia (Hb 10.6, plt 477k), anemia   Anesthesia Other Findings [facility] admission 10/23/23 - 10/27/23 for symptomatic anemia (low HGB of 7.5) and finding of duodenal mass. She presented with 3 days history of worsening dyspnea and indigestion.  Reproductive/Obstetrics                             Anesthesia Physical Anesthesia Plan  ASA: 4  Anesthesia Plan: General   Post-op Pain Management: Tylenol PO (pre-op)* and Epidural*   Induction: Intravenous  PONV Risk Score and Plan: 3 and Ondansetron, Dexamethasone and Treatment may vary due to age or medical condition  Airway Management Planned: Oral ETT  Additional Equipment: Arterial line  Intra-op Plan:   Post-operative Plan: Extubation in OR  Informed Consent: I have reviewed the patients History and Physical, chart, labs and discussed the procedure including the risks, benefits and alternatives for the proposed anesthesia with the patient or authorized representative who has indicated his/her understanding and acceptance.     Dental advisory given  Plan Discussed with: CRNA and Surgeon  Anesthesia Plan  Comments: (Given difficulty in epidural/fluid management of pt with HOCM, will plan on IV medical management of post op pain  PAT note written 11/16/2023 by Shonna Chock, PA-C. Per in-patient cardiology preoperative evaluation by Dr. Carolan Clines, "Mrs. Kuzminski is  in the hospital with a large duodenal mass and cardiology was consulted to aid with pre-op risk stratification. She has signs of HOCM on her echo. Will start her on a low dose BB. Recommend she remains well hydrated during her procedure with LVOT obstruction. Otherwise, she is acceptable cardiac risk for high risk surgery, whipple."      )        Anesthesia Quick Evaluation

## 2023-11-20 ENCOUNTER — Inpatient Hospital Stay: Payer: Medicare Other

## 2023-11-20 VITALS — BP 119/52 | HR 60 | Temp 98.1°F | Resp 18

## 2023-11-20 DIAGNOSIS — I421 Obstructive hypertrophic cardiomyopathy: Secondary | ICD-10-CM | POA: Diagnosis not present

## 2023-11-20 DIAGNOSIS — R109 Unspecified abdominal pain: Secondary | ICD-10-CM | POA: Diagnosis not present

## 2023-11-20 DIAGNOSIS — I080 Rheumatic disorders of both mitral and aortic valves: Secondary | ICD-10-CM | POA: Diagnosis present

## 2023-11-20 DIAGNOSIS — I509 Heart failure, unspecified: Secondary | ICD-10-CM | POA: Diagnosis not present

## 2023-11-20 DIAGNOSIS — Z452 Encounter for adjustment and management of vascular access device: Secondary | ICD-10-CM | POA: Diagnosis not present

## 2023-11-20 DIAGNOSIS — K7689 Other specified diseases of liver: Secondary | ICD-10-CM | POA: Diagnosis not present

## 2023-11-20 DIAGNOSIS — L03311 Cellulitis of abdominal wall: Secondary | ICD-10-CM | POA: Diagnosis present

## 2023-11-20 DIAGNOSIS — I82621 Acute embolism and thrombosis of deep veins of right upper extremity: Secondary | ICD-10-CM | POA: Diagnosis not present

## 2023-11-20 DIAGNOSIS — R140 Abdominal distension (gaseous): Secondary | ICD-10-CM | POA: Diagnosis not present

## 2023-11-20 DIAGNOSIS — K567 Ileus, unspecified: Secondary | ICD-10-CM | POA: Diagnosis not present

## 2023-11-20 DIAGNOSIS — M7989 Other specified soft tissue disorders: Secondary | ICD-10-CM | POA: Diagnosis not present

## 2023-11-20 DIAGNOSIS — R638 Other symptoms and signs concerning food and fluid intake: Secondary | ICD-10-CM | POA: Diagnosis not present

## 2023-11-20 DIAGNOSIS — I10 Essential (primary) hypertension: Secondary | ICD-10-CM | POA: Diagnosis not present

## 2023-11-20 DIAGNOSIS — K5730 Diverticulosis of large intestine without perforation or abscess without bleeding: Secondary | ICD-10-CM | POA: Diagnosis not present

## 2023-11-20 DIAGNOSIS — J90 Pleural effusion, not elsewhere classified: Secondary | ICD-10-CM | POA: Diagnosis not present

## 2023-11-20 DIAGNOSIS — Z711 Person with feared health complaint in whom no diagnosis is made: Secondary | ICD-10-CM | POA: Diagnosis not present

## 2023-11-20 DIAGNOSIS — K449 Diaphragmatic hernia without obstruction or gangrene: Secondary | ICD-10-CM | POA: Diagnosis not present

## 2023-11-20 DIAGNOSIS — D62 Acute posthemorrhagic anemia: Secondary | ICD-10-CM | POA: Diagnosis not present

## 2023-11-20 DIAGNOSIS — I517 Cardiomegaly: Secondary | ICD-10-CM | POA: Diagnosis not present

## 2023-11-20 DIAGNOSIS — D638 Anemia in other chronic diseases classified elsewhere: Secondary | ICD-10-CM | POA: Diagnosis not present

## 2023-11-20 DIAGNOSIS — R627 Adult failure to thrive: Secondary | ICD-10-CM | POA: Diagnosis present

## 2023-11-20 DIAGNOSIS — K922 Gastrointestinal hemorrhage, unspecified: Secondary | ICD-10-CM | POA: Diagnosis present

## 2023-11-20 DIAGNOSIS — R5381 Other malaise: Secondary | ICD-10-CM | POA: Diagnosis not present

## 2023-11-20 DIAGNOSIS — R110 Nausea: Secondary | ICD-10-CM | POA: Diagnosis not present

## 2023-11-20 DIAGNOSIS — J9811 Atelectasis: Secondary | ICD-10-CM | POA: Diagnosis present

## 2023-11-20 DIAGNOSIS — Z7189 Other specified counseling: Secondary | ICD-10-CM | POA: Diagnosis not present

## 2023-11-20 DIAGNOSIS — R5383 Other fatigue: Secondary | ICD-10-CM | POA: Diagnosis not present

## 2023-11-20 DIAGNOSIS — E8809 Other disorders of plasma-protein metabolism, not elsewhere classified: Secondary | ICD-10-CM | POA: Diagnosis not present

## 2023-11-20 DIAGNOSIS — C170 Malignant neoplasm of duodenum: Secondary | ICD-10-CM | POA: Diagnosis not present

## 2023-11-20 DIAGNOSIS — Z66 Do not resuscitate: Secondary | ICD-10-CM | POA: Diagnosis not present

## 2023-11-20 DIAGNOSIS — T8143XA Infection following a procedure, organ and space surgical site, initial encounter: Secondary | ICD-10-CM | POA: Diagnosis not present

## 2023-11-20 DIAGNOSIS — D369 Benign neoplasm, unspecified site: Secondary | ICD-10-CM | POA: Diagnosis not present

## 2023-11-20 DIAGNOSIS — E870 Hyperosmolality and hypernatremia: Secondary | ICD-10-CM | POA: Diagnosis not present

## 2023-11-20 DIAGNOSIS — R0600 Dyspnea, unspecified: Secondary | ICD-10-CM | POA: Diagnosis not present

## 2023-11-20 DIAGNOSIS — N200 Calculus of kidney: Secondary | ICD-10-CM | POA: Diagnosis not present

## 2023-11-20 DIAGNOSIS — R188 Other ascites: Secondary | ICD-10-CM | POA: Diagnosis not present

## 2023-11-20 DIAGNOSIS — I700 Atherosclerosis of aorta: Secondary | ICD-10-CM | POA: Diagnosis not present

## 2023-11-20 DIAGNOSIS — K651 Peritoneal abscess: Secondary | ICD-10-CM | POA: Diagnosis not present

## 2023-11-20 DIAGNOSIS — Z789 Other specified health status: Secondary | ICD-10-CM | POA: Diagnosis not present

## 2023-11-20 DIAGNOSIS — Z515 Encounter for palliative care: Secondary | ICD-10-CM | POA: Diagnosis not present

## 2023-11-20 DIAGNOSIS — L02211 Cutaneous abscess of abdominal wall: Secondary | ICD-10-CM | POA: Diagnosis not present

## 2023-11-20 DIAGNOSIS — C772 Secondary and unspecified malignant neoplasm of intra-abdominal lymph nodes: Secondary | ICD-10-CM | POA: Diagnosis not present

## 2023-11-20 DIAGNOSIS — E43 Unspecified severe protein-calorie malnutrition: Secondary | ICD-10-CM | POA: Diagnosis not present

## 2023-11-20 DIAGNOSIS — R918 Other nonspecific abnormal finding of lung field: Secondary | ICD-10-CM | POA: Diagnosis not present

## 2023-11-20 DIAGNOSIS — R1310 Dysphagia, unspecified: Secondary | ICD-10-CM | POA: Diagnosis not present

## 2023-11-20 DIAGNOSIS — K4090 Unilateral inguinal hernia, without obstruction or gangrene, not specified as recurrent: Secondary | ICD-10-CM | POA: Diagnosis not present

## 2023-11-20 DIAGNOSIS — D132 Benign neoplasm of duodenum: Secondary | ICD-10-CM | POA: Diagnosis not present

## 2023-11-20 DIAGNOSIS — D500 Iron deficiency anemia secondary to blood loss (chronic): Secondary | ICD-10-CM

## 2023-11-20 DIAGNOSIS — Z4682 Encounter for fitting and adjustment of non-vascular catheter: Secondary | ICD-10-CM | POA: Diagnosis not present

## 2023-11-20 MED ORDER — CETIRIZINE HCL 10 MG/ML IV SOLN
10.0000 mg | Freq: Once | INTRAVENOUS | Status: AC
  Administered 2023-11-20: 10 mg via INTRAVENOUS
  Filled 2023-11-20: qty 1

## 2023-11-20 MED ORDER — SODIUM CHLORIDE 0.9 % IV SOLN
510.0000 mg | Freq: Once | INTRAVENOUS | Status: AC
  Administered 2023-11-20: 510 mg via INTRAVENOUS
  Filled 2023-11-20: qty 17

## 2023-11-20 MED ORDER — SODIUM CHLORIDE 0.9 % IV SOLN: Freq: Once | INTRAVENOUS | Status: AC

## 2023-11-20 NOTE — Patient Instructions (Signed)
 Ferumoxytol Injection What is this medication? FERUMOXYTOL (FER ue MOX i tol) treats low levels of iron in your body (iron deficiency anemia). Iron is a mineral that plays an important role in making red blood cells, which carry oxygen from your lungs to the rest of your body. This medicine may be used for other purposes; ask your health care provider or pharmacist if you have questions. COMMON BRAND NAME(S): Feraheme What should I tell my care team before I take this medication? They need to know if you have any of these conditions: Anemia not caused by low iron levels High levels of iron in the blood Magnetic resonance imaging (MRI) test scheduled An unusual or allergic reaction to iron, other medications, foods, dyes, or preservatives Pregnant or trying to get pregnant Breastfeeding How should I use this medication? This medication is injected into a vein. It is given by your care team in a hospital or clinic setting. Talk to your care team the use of this medication in children. Special care may be needed. Overdosage: If you think you have taken too much of this medicine contact a poison control center or emergency room at once. NOTE: This medicine is only for you. Do not share this medicine with others. What if I miss a dose? It is important not to miss your dose. Call your care team if you are unable to keep an appointment. What may interact with this medication? Other iron products This list may not describe all possible interactions. Give your health care provider a list of all the medicines, herbs, non-prescription drugs, or dietary supplements you use. Also tell them if you smoke, drink alcohol, or use illegal drugs. Some items may interact with your medicine. What should I watch for while using this medication? Visit your care team regularly. Tell your care team if your symptoms do not start to get better or if they get worse. You may need blood work done while you are taking this  medication. You may need to follow a special diet. Talk to your care team. Foods that contain iron include: whole grains/cereals, dried fruits, beans, or peas, leafy green vegetables, and organ meats (liver, kidney). What side effects may I notice from receiving this medication? Side effects that you should report to your care team as soon as possible: Allergic reactions--skin rash, itching, hives, swelling of the face, lips, tongue, or throat Low blood pressure--dizziness, feeling faint or lightheaded, blurry vision Shortness of breath Side effects that usually do not require medical attention (report to your care team if they continue or are bothersome): Flushing Headache Joint pain Muscle pain Nausea Pain, redness, or irritation at injection site This list may not describe all possible side effects. Call your doctor for medical advice about side effects. You may report side effects to FDA at 1-800-FDA-1088. Where should I keep my medication? This medication is given in a hospital or clinic. It will not be stored at home. NOTE: This sheet is a summary. It may not cover all possible information. If you have questions about this medicine, talk to your doctor, pharmacist, or health care provider.  2024 Elsevier/Gold Standard (2023-05-14 00:00:00)

## 2023-11-22 NOTE — H&P (Signed)
PROVIDER:  Matthias Hughs, MD Patient Care Team: [name], MD as PCP - General (Family Medicine)   MRN: [id number] DOB: Oct 05, 1936 DATE OF ENCOUNTER: 11/01/2023   Plan Chief Complaint: New Consultation ( Discuss whipple for mass of 2nd portion of Duodenum w/ GI bleeding)       History of Present Illness: Christine Morse is a 87 y.o. 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  EGD demonstrated a mass in the second portion of her duodenum.  Biopsy showed adenoma with high-grade dysplasia.  She underwent staging scans and was seen to have an abnormal enlarged appearing mesenteric node and a axillary node.  She required transfusion and felt better afterwards.  She was discharged back to independent living at home.   Cardiology saw her while in house as she has mild aortic stenosis and slightly narrow left ventricular outflow tract.  She was felt to be optimized with 6% chance of 30-day risk of cardiac event.   CT abd/pelvis 10/27/23  IMPRESSION: 1. 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 2. Mildly enlarged 1.1 cm central mesenteric node, potentially a nodal metastasis. No additional findings of metastatic disease in the abdomen or pelvis. 3. Small to moderate left inguinal hernia contains a small bowel loop and a small amount of trapped ascitic fluid. No evidence of acute bowel complication. 4. Moderate to large hiatal hernia. 5. Moderate sigmoid diverticulosis. 6. Small dependent bilateral pleural effusions. 7.  Aortic Atherosclerosis (ICD10-I70.0).   CT angio chest 10/24/23 IMPRESSION: No evidence of pulmonary embolus.  No acute cardiopulmonary disease.  Moderate sized hiatal hernia.  Coronary artery disease.   Aortic Atherosclerosis (ICD10-I70.0).   EGD Marina Goodell 10/25/23 The esophagus was normal. The stomach revealed a large hiatal hernia but was otherwise normal. The examined duodenum revealed a large concentric mass in the second portion. Large areas were soft and consistent with villous adenoma. Other areas were firm superficial ulceration concerning for cancer. This was nonobstructing. Normal duodenum beyond the lesion. Biopsies were taken with a cold forceps for histology.The cardia and gastric fundus were normal on retroflexion.  Impression: 1. Large duodenal mass involving the second portion as described. Likely malignant. This is the cause for GI blood loss. 2. Large hiatal hernia without erosions 3. Otherwise unremarkable EGD.   Pathology EGD 10/25/23 A. DUODENUM, BIOPSY:  -  Superficial fragments of duodenal adenoma with high-grade dysplasia,  highly suspicious for invasion.      Interval history:    Patient is referred to me for consideration of pancreaticoduodenectomy.  The patient has returned back to her normal activities and does housework and some yard work.  She is able to also do her own shopping.  She feels well overall.  She no longer has shortness of breath.   She is here with her family to discuss surgery.  Patient and family denied history of falls and patient does not use assistive device for walking.       Review of Systems: A complete review of systems was obtained from the patient.  I have reviewed this information and discussed as appropriate with the patient.  See HPI as well for other ROS.   Review of Systems  Endo/Heme/Allergies:  Bruises/bleeds easily.  All other systems reviewed and are negative.  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    Chronic blood loss anemia        Past Surgical History       Past Surgical History:  Procedure Laterality Date   HYSTERECTOMY            Allergies      Allergies  Allergen Reactions   Codeine Unknown        Medications Ordered Prior to Encounter        Current Outpatient Medications on File Prior to Visit  Medication Sig Dispense Refill   calcium carbonate-vitamin D3 500 mg-3.125 mcg (125 unit) per tablet Take 2 tablets by mouth once daily       losartan (COZAAR) 100 MG tablet Take 100 mg by mouth once daily       metoprolol succinate (TOPROL-XL) 25 MG XL tablet Take 12.5 mg by mouth once daily       rosuvastatin (CRESTOR) 5 MG tablet Take 5 mg by mouth once daily        No current facility-administered medications on file prior to visit.        Family History  No family history on file.      Tobacco Use History  Social History       Tobacco Use  Smoking Status Never  Smokeless Tobacco Never        Social History  Social History        Socioeconomic History   Marital status: Married  Tobacco Use   Smoking status: Never   Smokeless tobacco: Never  Substance and Sexual Activity   Alcohol use: Not Currently   Drug use: Never    Social Drivers of Health        Food Insecurity: No Food Insecurity (10/24/2023)    Received from [facility]    Hunger Vital Sign     Worried About Running Out of Food in the Last Year: Never true     Ran Out of Food in the Last Year: Never true  Transportation Needs: No Transportation Needs (10/24/2023)    Received from [facility] - Transportation     Lack of Transportation (Medical): No     Lack of Transportation (Non-Medical): No        Objective:         Vitals:    11/01/23 1454  BP: (!) 144/71  Pulse: 75  Temp: 36.1 C (97 F)  SpO2: 96%  Weight: 54.4 kg (120 lb)  Height: 144.8 cm (4\' 9" )    Body mass index is 25.97 kg/m.   Head:   Normocephalic and atraumatic.  Eyes:    Conjunctivae are  normal. Pupils are equal, round, and reactive to light. No scleral icterus.  Neck:   Normal range of motion. Neck supple. No tracheal deviation present. No thyromegaly present.  Resp:No respiratory distress, normal effort. Abd:      Abdomen is soft, non distended and non tender. No masses are palpable.  There is no rebound and no guarding.  Neurological: Alert and oriented to person, place, and time. Coordination normal.  Skin:    Skin is warm and dry. No rash noted.  No diaphoretic. No erythema. No pallor.  Psychiatric: Normal mood and affect. Normal behavior. Judgment and thought content normal.      Labs, Imaging and Diagnostic Testing:   CBC 10/25/23 with HCT 27.4 (as low as 22.7) CMET 10/23/23 near normal, albumin 3.8     Assessment and Plan:    Assessment Diagnoses and all orders for this visit:   Chronic blood loss anemia   Duodenal adenoma   Clinically, the patient appears to have a malignant duodenal lesion rather than just an adenoma with high-grade dysplasia.  This was concentric.  The patient would require pancreaticoduodenectomy for resection.   The patient is quite robust.  I ran the [employer] of surgeons risk calculator, she had very close risk to baseline except for risk of discharged to assisted living facility.  She has slightly higher risk of death but again still quite close to the baseline.   I think because of her very good functional status, I do think she is a candidate for Whipple.  The patient and family would like to pursue potentially curative surgical treatment.   I discussed the surgery with the patient including diagrams of anatomy.  I discussed the potential for diagnostic laparoscopy.  In the case of pancreatic cancer, if spread of the disease is found, we will abort the procedure and not proceed with resection.  The rationale for this was discussed with the patient.  There has not been data to support resection of Stage IV disease in terms of  survival benefit.     We discussed possible complications including: Potential of aborting procedure if tumor is invading the superior mesenteric or hepatic arteries Bleeding Infection and possible wound complications such as hernia Damage to adjacent structures Leak of anastamoses, primarily pancreatic Possible need for other procedures Possible prolonged nausea with possible need for external feeding.   Possible prolonged hospital stay. Possible development of diabetes or worsening of current diabetes.  Possible pancreatic exocrine insufficiency Prolonged fatigue/weakness/appetite Possible early recurrence of cancer     The patient understands and wishes to proceed.

## 2023-11-23 ENCOUNTER — Encounter (HOSPITAL_COMMUNITY): Payer: Self-pay | Admitting: General Surgery

## 2023-11-23 ENCOUNTER — Inpatient Hospital Stay (HOSPITAL_COMMUNITY): Payer: Medicare Other | Admitting: Vascular Surgery

## 2023-11-23 ENCOUNTER — Inpatient Hospital Stay (HOSPITAL_COMMUNITY): Payer: Self-pay | Admitting: Anesthesiology

## 2023-11-23 ENCOUNTER — Inpatient Hospital Stay (HOSPITAL_COMMUNITY)
Admission: RE | Admit: 2023-11-23 | Discharge: 2024-01-18 | DRG: 326 | Disposition: A | Discharge status: Other Acute Inpatient Hospital | Payer: Medicare Other | Attending: General Surgery | Admitting: General Surgery

## 2023-11-23 ENCOUNTER — Encounter (HOSPITAL_COMMUNITY)
Admission: RE | Disposition: A | Discharge status: Nursing Home (Includes ICF) | Payer: Self-pay | Source: Home / Self Care | Attending: General Surgery

## 2023-11-23 ENCOUNTER — Other Ambulatory Visit: Payer: Self-pay

## 2023-11-23 DIAGNOSIS — E8809 Other disorders of plasma-protein metabolism, not elsewhere classified: Secondary | ICD-10-CM | POA: Diagnosis present

## 2023-11-23 DIAGNOSIS — I700 Atherosclerosis of aorta: Secondary | ICD-10-CM | POA: Diagnosis present

## 2023-11-23 DIAGNOSIS — K5730 Diverticulosis of large intestine without perforation or abscess without bleeding: Secondary | ICD-10-CM | POA: Diagnosis present

## 2023-11-23 DIAGNOSIS — K8689 Other specified diseases of pancreas: Secondary | ICD-10-CM

## 2023-11-23 DIAGNOSIS — R918 Other nonspecific abnormal finding of lung field: Secondary | ICD-10-CM | POA: Diagnosis not present

## 2023-11-23 DIAGNOSIS — R188 Other ascites: Secondary | ICD-10-CM | POA: Diagnosis not present

## 2023-11-23 DIAGNOSIS — Z90710 Acquired absence of both cervix and uterus: Secondary | ICD-10-CM

## 2023-11-23 DIAGNOSIS — J90 Pleural effusion, not elsewhere classified: Secondary | ICD-10-CM | POA: Diagnosis present

## 2023-11-23 DIAGNOSIS — M7989 Other specified soft tissue disorders: Secondary | ICD-10-CM | POA: Diagnosis not present

## 2023-11-23 DIAGNOSIS — Z8249 Family history of ischemic heart disease and other diseases of the circulatory system: Secondary | ICD-10-CM

## 2023-11-23 DIAGNOSIS — C170 Malignant neoplasm of duodenum: Secondary | ICD-10-CM

## 2023-11-23 DIAGNOSIS — M810 Age-related osteoporosis without current pathological fracture: Secondary | ICD-10-CM | POA: Diagnosis present

## 2023-11-23 DIAGNOSIS — I10 Essential (primary) hypertension: Secondary | ICD-10-CM | POA: Diagnosis present

## 2023-11-23 DIAGNOSIS — R0600 Dyspnea, unspecified: Secondary | ICD-10-CM | POA: Diagnosis not present

## 2023-11-23 DIAGNOSIS — I080 Rheumatic disorders of both mitral and aortic valves: Secondary | ICD-10-CM | POA: Diagnosis present

## 2023-11-23 DIAGNOSIS — Z66 Do not resuscitate: Secondary | ICD-10-CM | POA: Diagnosis not present

## 2023-11-23 DIAGNOSIS — D638 Anemia in other chronic diseases classified elsewhere: Secondary | ICD-10-CM | POA: Diagnosis present

## 2023-11-23 DIAGNOSIS — I509 Heart failure, unspecified: Secondary | ICD-10-CM | POA: Diagnosis not present

## 2023-11-23 DIAGNOSIS — I517 Cardiomegaly: Secondary | ICD-10-CM | POA: Diagnosis not present

## 2023-11-23 DIAGNOSIS — Z4682 Encounter for fitting and adjustment of non-vascular catheter: Secondary | ICD-10-CM | POA: Diagnosis not present

## 2023-11-23 DIAGNOSIS — K567 Ileus, unspecified: Secondary | ICD-10-CM | POA: Diagnosis not present

## 2023-11-23 DIAGNOSIS — B370 Candidal stomatitis: Secondary | ICD-10-CM

## 2023-11-23 DIAGNOSIS — D509 Iron deficiency anemia, unspecified: Secondary | ICD-10-CM | POA: Diagnosis present

## 2023-11-23 DIAGNOSIS — R54 Age-related physical debility: Secondary | ICD-10-CM | POA: Diagnosis present

## 2023-11-23 DIAGNOSIS — L02211 Cutaneous abscess of abdominal wall: Secondary | ICD-10-CM | POA: Diagnosis not present

## 2023-11-23 DIAGNOSIS — Z8711 Personal history of peptic ulcer disease: Secondary | ICD-10-CM

## 2023-11-23 DIAGNOSIS — L03311 Cellulitis of abdominal wall: Secondary | ICD-10-CM | POA: Diagnosis present

## 2023-11-23 DIAGNOSIS — R5383 Other fatigue: Secondary | ICD-10-CM | POA: Diagnosis not present

## 2023-11-23 DIAGNOSIS — D62 Acute posthemorrhagic anemia: Secondary | ICD-10-CM | POA: Diagnosis not present

## 2023-11-23 DIAGNOSIS — Z452 Encounter for adjustment and management of vascular access device: Secondary | ICD-10-CM | POA: Diagnosis not present

## 2023-11-23 DIAGNOSIS — K651 Peritoneal abscess: Secondary | ICD-10-CM | POA: Diagnosis not present

## 2023-11-23 DIAGNOSIS — E785 Hyperlipidemia, unspecified: Secondary | ICD-10-CM | POA: Diagnosis present

## 2023-11-23 DIAGNOSIS — C772 Secondary and unspecified malignant neoplasm of intra-abdominal lymph nodes: Secondary | ICD-10-CM | POA: Diagnosis present

## 2023-11-23 DIAGNOSIS — I2510 Atherosclerotic heart disease of native coronary artery without angina pectoris: Secondary | ICD-10-CM | POA: Diagnosis present

## 2023-11-23 DIAGNOSIS — K922 Gastrointestinal hemorrhage, unspecified: Secondary | ICD-10-CM | POA: Diagnosis present

## 2023-11-23 DIAGNOSIS — D369 Benign neoplasm, unspecified site: Secondary | ICD-10-CM | POA: Diagnosis not present

## 2023-11-23 DIAGNOSIS — K449 Diaphragmatic hernia without obstruction or gangrene: Secondary | ICD-10-CM | POA: Diagnosis not present

## 2023-11-23 DIAGNOSIS — R1310 Dysphagia, unspecified: Secondary | ICD-10-CM | POA: Diagnosis not present

## 2023-11-23 DIAGNOSIS — E43 Unspecified severe protein-calorie malnutrition: Secondary | ICD-10-CM | POA: Insufficient documentation

## 2023-11-23 DIAGNOSIS — Z6824 Body mass index (BMI) 24.0-24.9, adult: Secondary | ICD-10-CM

## 2023-11-23 DIAGNOSIS — Z87442 Personal history of urinary calculi: Secondary | ICD-10-CM

## 2023-11-23 DIAGNOSIS — Z801 Family history of malignant neoplasm of trachea, bronchus and lung: Secondary | ICD-10-CM

## 2023-11-23 DIAGNOSIS — R627 Adult failure to thrive: Secondary | ICD-10-CM | POA: Diagnosis present

## 2023-11-23 DIAGNOSIS — R140 Abdominal distension (gaseous): Secondary | ICD-10-CM | POA: Diagnosis not present

## 2023-11-23 DIAGNOSIS — R5381 Other malaise: Secondary | ICD-10-CM | POA: Diagnosis not present

## 2023-11-23 DIAGNOSIS — J9811 Atelectasis: Secondary | ICD-10-CM | POA: Diagnosis present

## 2023-11-23 DIAGNOSIS — T8143XA Infection following a procedure, organ and space surgical site, initial encounter: Secondary | ICD-10-CM | POA: Diagnosis not present

## 2023-11-23 DIAGNOSIS — R29898 Other symptoms and signs involving the musculoskeletal system: Secondary | ICD-10-CM

## 2023-11-23 DIAGNOSIS — R110 Nausea: Secondary | ICD-10-CM | POA: Diagnosis not present

## 2023-11-23 DIAGNOSIS — I82621 Acute embolism and thrombosis of deep veins of right upper extremity: Secondary | ICD-10-CM | POA: Diagnosis not present

## 2023-11-23 DIAGNOSIS — Z789 Other specified health status: Secondary | ICD-10-CM | POA: Diagnosis not present

## 2023-11-23 DIAGNOSIS — Z8041 Family history of malignant neoplasm of ovary: Secondary | ICD-10-CM

## 2023-11-23 DIAGNOSIS — Z90721 Acquired absence of ovaries, unilateral: Secondary | ICD-10-CM

## 2023-11-23 DIAGNOSIS — Z79899 Other long term (current) drug therapy: Secondary | ICD-10-CM

## 2023-11-23 DIAGNOSIS — K4090 Unilateral inguinal hernia, without obstruction or gangrene, not specified as recurrent: Secondary | ICD-10-CM | POA: Diagnosis not present

## 2023-11-23 DIAGNOSIS — K7689 Other specified diseases of liver: Secondary | ICD-10-CM | POA: Diagnosis present

## 2023-11-23 DIAGNOSIS — E876 Hypokalemia: Secondary | ICD-10-CM | POA: Diagnosis not present

## 2023-11-23 DIAGNOSIS — D132 Benign neoplasm of duodenum: Secondary | ICD-10-CM | POA: Diagnosis present

## 2023-11-23 DIAGNOSIS — E870 Hyperosmolality and hypernatremia: Secondary | ICD-10-CM | POA: Diagnosis present

## 2023-11-23 DIAGNOSIS — I350 Nonrheumatic aortic (valve) stenosis: Secondary | ICD-10-CM

## 2023-11-23 DIAGNOSIS — I421 Obstructive hypertrophic cardiomyopathy: Secondary | ICD-10-CM

## 2023-11-23 DIAGNOSIS — D6489 Other specified anemias: Secondary | ICD-10-CM | POA: Insufficient documentation

## 2023-11-23 DIAGNOSIS — R195 Other fecal abnormalities: Secondary | ICD-10-CM | POA: Diagnosis present

## 2023-11-23 DIAGNOSIS — K219 Gastro-esophageal reflux disease without esophagitis: Secondary | ICD-10-CM | POA: Diagnosis present

## 2023-11-23 DIAGNOSIS — R638 Other symptoms and signs concerning food and fluid intake: Secondary | ICD-10-CM | POA: Diagnosis not present

## 2023-11-23 DIAGNOSIS — Z7189 Other specified counseling: Secondary | ICD-10-CM | POA: Diagnosis not present

## 2023-11-23 DIAGNOSIS — Z7901 Long term (current) use of anticoagulants: Secondary | ICD-10-CM

## 2023-11-23 DIAGNOSIS — Z711 Person with feared health complaint in whom no diagnosis is made: Secondary | ICD-10-CM | POA: Diagnosis not present

## 2023-11-23 DIAGNOSIS — R451 Restlessness and agitation: Secondary | ICD-10-CM | POA: Diagnosis not present

## 2023-11-23 DIAGNOSIS — B379 Candidiasis, unspecified: Secondary | ICD-10-CM | POA: Diagnosis not present

## 2023-11-23 DIAGNOSIS — N200 Calculus of kidney: Secondary | ICD-10-CM | POA: Diagnosis not present

## 2023-11-23 DIAGNOSIS — K30 Functional dyspepsia: Secondary | ICD-10-CM

## 2023-11-23 DIAGNOSIS — Z515 Encounter for palliative care: Secondary | ICD-10-CM | POA: Diagnosis not present

## 2023-11-23 DIAGNOSIS — R109 Unspecified abdominal pain: Secondary | ICD-10-CM | POA: Diagnosis not present

## 2023-11-23 DIAGNOSIS — R739 Hyperglycemia, unspecified: Secondary | ICD-10-CM

## 2023-11-23 HISTORY — PX: LAPAROSCOPY: SHX197

## 2023-11-23 HISTORY — PX: WHIPPLE PROCEDURE: SHX2667

## 2023-11-23 LAB — MRSA NEXT GEN BY PCR, NASAL: MRSA by PCR Next Gen: NOT DETECTED

## 2023-11-23 LAB — PREPARE RBC (CROSSMATCH)

## 2023-11-23 SURGERY — LAPAROSCOPY, DIAGNOSTIC
Anesthesia: General | Site: Abdomen

## 2023-11-23 MED ORDER — METOPROLOL TARTRATE 5 MG/5ML IV SOLN
5.0000 mg | Freq: Four times a day (QID) | INTRAVENOUS | Status: DC
  Administered 2023-11-23 – 2023-11-25 (×7): 5 mg via INTRAVENOUS
  Filled 2023-11-23 (×7): qty 5

## 2023-11-23 MED ORDER — FENTANYL CITRATE (PF) 100 MCG/2ML IJ SOLN
INTRAMUSCULAR | Status: AC
  Filled 2023-11-23: qty 2

## 2023-11-23 MED ORDER — LIDOCAINE 2% (20 MG/ML) 5 ML SYRINGE
INTRAMUSCULAR | Status: AC
  Filled 2023-11-23: qty 5

## 2023-11-23 MED ORDER — ACETAMINOPHEN 10 MG/ML IV SOLN
1000.0000 mg | Freq: Four times a day (QID) | INTRAVENOUS | Status: AC
  Administered 2023-11-23 – 2023-11-24 (×3): 1000 mg via INTRAVENOUS
  Filled 2023-11-23 (×4): qty 100

## 2023-11-23 MED ORDER — FENTANYL 50 MCG/ML IV PCA SOLN
INTRAVENOUS | Status: DC
Start: 2023-11-23 — End: 2023-11-23
  Filled 2023-11-23: qty 25

## 2023-11-23 MED ORDER — HYDROMORPHONE HCL 1 MG/ML IJ SOLN
INTRAMUSCULAR | Status: DC | PRN
  Administered 2023-11-23 (×2): .25 mg via INTRAVENOUS

## 2023-11-23 MED ORDER — CHLORHEXIDINE GLUCONATE CLOTH 2 % EX PADS
6.0000 | MEDICATED_PAD | Freq: Every day | CUTANEOUS | Status: DC
  Administered 2023-11-23 – 2024-01-18 (×55): 6 via TOPICAL

## 2023-11-23 MED ORDER — OXYCODONE HCL 5 MG/5ML PO SOLN: 5.0000 mg | Freq: Once | ORAL | Status: DC | PRN

## 2023-11-23 MED ORDER — SODIUM CHLORIDE 0.9 % IV SOLN
10.0000 mL/h | Freq: Once | INTRAVENOUS | Status: AC
  Administered 2023-11-23: 10 mL/h via INTRAVENOUS

## 2023-11-23 MED ORDER — SODIUM CHLORIDE 0.9 % IV SOLN: INTRAVENOUS | Status: DC | PRN

## 2023-11-23 MED ORDER — ROCURONIUM BROMIDE 10 MG/ML (PF) SYRINGE
PREFILLED_SYRINGE | INTRAVENOUS | Status: AC
  Filled 2023-11-23: qty 10

## 2023-11-23 MED ORDER — PROPOFOL 10 MG/ML IV BOLUS
INTRAVENOUS | Status: AC
  Filled 2023-11-23: qty 20

## 2023-11-23 MED ORDER — PANTOPRAZOLE SODIUM 40 MG IV SOLR
40.0000 mg | Freq: Every day | INTRAVENOUS | Status: DC
  Administered 2023-11-23 – 2023-11-26 (×4): 40 mg via INTRAVENOUS
  Filled 2023-11-23 (×4): qty 10

## 2023-11-23 MED ORDER — ONDANSETRON HCL 4 MG/2ML IJ SOLN
INTRAMUSCULAR | Status: AC
  Filled 2023-11-23: qty 2

## 2023-11-23 MED ORDER — LIDOCAINE HCL 1 % IJ SOLN
INTRAMUSCULAR | Status: DC | PRN
  Administered 2023-11-23: 24 mL via INTRAMUSCULAR

## 2023-11-23 MED ORDER — FENTANYL CITRATE (PF) 100 MCG/2ML IJ SOLN
50.0000 ug | INTRAMUSCULAR | Status: AC | PRN
Start: 2023-11-23 — End: 2023-11-23
  Administered 2023-11-23 (×2): 50 ug via INTRAVENOUS

## 2023-11-23 MED ORDER — PROPOFOL 500 MG/50ML IV EMUL
INTRAVENOUS | Status: DC | PRN
  Administered 2023-11-23: 50 ug/kg/min via INTRAVENOUS

## 2023-11-23 MED ORDER — CHLORHEXIDINE GLUCONATE 0.12 % MT SOLN
15.0000 mL | Freq: Once | OROMUCOSAL | Status: AC
  Administered 2023-11-23: 15 mL via OROMUCOSAL
  Filled 2023-11-23: qty 15

## 2023-11-23 MED ORDER — NALOXONE HCL 0.4 MG/ML IJ SOLN
0.4000 mg | INTRAMUSCULAR | Status: DC | PRN
Start: 2023-11-23 — End: 2023-11-23

## 2023-11-23 MED ORDER — ACETAMINOPHEN 500 MG PO TABS
1000.0000 mg | ORAL_TABLET | ORAL | Status: AC
  Filled 2023-11-23: qty 2

## 2023-11-23 MED ORDER — CHLORHEXIDINE GLUCONATE CLOTH 2 % EX PADS: 6.0000 | MEDICATED_PAD | Freq: Once | CUTANEOUS | Status: DC

## 2023-11-23 MED ORDER — ONDANSETRON HCL 4 MG/2ML IJ SOLN
INTRAMUSCULAR | Status: DC | PRN
  Administered 2023-11-23: 4 mg via INTRAVENOUS

## 2023-11-23 MED ORDER — FENTANYL CITRATE PF 50 MCG/ML IJ SOSY
25.0000 ug | PREFILLED_SYRINGE | INTRAMUSCULAR | Status: DC | PRN
  Administered 2023-11-23 – 2024-01-03 (×28): 25 ug via INTRAVENOUS
  Filled 2023-11-23 (×29): qty 1

## 2023-11-23 MED ORDER — ACETAMINOPHEN 500 MG PO TABS
1000.0000 mg | ORAL_TABLET | Freq: Once | ORAL | Status: AC
  Administered 2023-11-23: 1000 mg via ORAL

## 2023-11-23 MED ORDER — ALBUMIN HUMAN 25 % IV SOLN
25.0000 g | Freq: Four times a day (QID) | INTRAVENOUS | Status: AC
  Administered 2023-11-23: 25 g via INTRAVENOUS
  Administered 2023-11-23: 12.5 g via INTRAVENOUS
  Administered 2023-11-24 – 2023-11-25 (×6): 25 g via INTRAVENOUS
  Filled 2023-11-23 (×8): qty 100

## 2023-11-23 MED ORDER — PHENYLEPHRINE HCL-NACL 20-0.9 MG/250ML-% IV SOLN
INTRAVENOUS | Status: DC | PRN
  Administered 2023-11-23: 40 ug/min via INTRAVENOUS

## 2023-11-23 MED ORDER — VISTASEAL 10 ML SINGLE DOSE KIT
10.0000 mL | PACK | Freq: Once | CUTANEOUS | Status: AC
  Administered 2023-11-23: 10 mL via TOPICAL
  Filled 2023-11-23: qty 10

## 2023-11-23 MED ORDER — PROCHLORPERAZINE MALEATE 10 MG PO TABS: 10.0000 mg | ORAL_TABLET | Freq: Four times a day (QID) | ORAL | Status: DC | PRN

## 2023-11-23 MED ORDER — LIDOCAINE HCL (PF) 1 % IJ SOLN
INTRAMUSCULAR | Status: AC
  Filled 2023-11-23: qty 30

## 2023-11-23 MED ORDER — LIDOCAINE IN D5W 4-5 MG/ML-% IV SOLN
INTRAVENOUS | Status: DC | PRN
  Administered 2023-11-23: 2 mg/min via INTRAVENOUS

## 2023-11-23 MED ORDER — MIDAZOLAM HCL 2 MG/2ML IJ SOLN: 0.5000 mg | Freq: Once | INTRAMUSCULAR | Status: DC | PRN

## 2023-11-23 MED ORDER — METHOCARBAMOL 1000 MG/10ML IJ SOLN
500.0000 mg | Freq: Three times a day (TID) | INTRAMUSCULAR | Status: DC | PRN
  Administered 2023-11-25 – 2023-12-25 (×11): 500 mg via INTRAVENOUS
  Filled 2023-11-23 (×12): qty 10

## 2023-11-23 MED ORDER — FENTANYL CITRATE (PF) 250 MCG/5ML IJ SOLN
INTRAMUSCULAR | Status: AC
  Filled 2023-11-23: qty 5

## 2023-11-23 MED ORDER — PHENYLEPHRINE 80 MCG/ML (10ML) SYRINGE FOR IV PUSH (FOR BLOOD PRESSURE SUPPORT)
PREFILLED_SYRINGE | INTRAVENOUS | Status: DC | PRN
  Administered 2023-11-23: 40 ug via INTRAVENOUS
  Administered 2023-11-23: 80 ug via INTRAVENOUS
  Administered 2023-11-23: 10 ug via INTRAVENOUS
  Administered 2023-11-23 (×2): 20 ug via INTRAVENOUS

## 2023-11-23 MED ORDER — HYDROMORPHONE HCL 1 MG/ML IJ SOLN
0.2500 mg | INTRAMUSCULAR | Status: DC | PRN
Start: 2023-11-23 — End: 2023-11-23
  Administered 2023-11-23 (×4): 0.5 mg via INTRAVENOUS

## 2023-11-23 MED ORDER — CEFAZOLIN SODIUM-DEXTROSE 2-4 GM/100ML-% IV SOLN
2.0000 g | Freq: Three times a day (TID) | INTRAVENOUS | Status: AC
  Administered 2023-11-23: 2 g via INTRAVENOUS
  Filled 2023-11-23: qty 100

## 2023-11-23 MED ORDER — BUPIVACAINE-EPINEPHRINE (PF) 0.25% -1:200000 IJ SOLN
INTRAMUSCULAR | Status: AC
  Filled 2023-11-23: qty 30

## 2023-11-23 MED ORDER — SUGAMMADEX SODIUM 200 MG/2ML IV SOLN
INTRAVENOUS | Status: DC | PRN
  Administered 2023-11-23: 200 mg via INTRAVENOUS
  Administered 2023-11-23: 100 mg via INTRAVENOUS

## 2023-11-23 MED ORDER — DEXAMETHASONE SODIUM PHOSPHATE 10 MG/ML IJ SOLN
INTRAMUSCULAR | Status: AC
  Filled 2023-11-23: qty 1

## 2023-11-23 MED ORDER — PROPOFOL 10 MG/ML IV BOLUS
INTRAVENOUS | Status: DC | PRN
  Administered 2023-11-23: 90 mg via INTRAVENOUS
  Administered 2023-11-23: 30 mg via INTRAVENOUS
  Administered 2023-11-23: 50 mg via INTRAVENOUS

## 2023-11-23 MED ORDER — WATER FOR IRRIGATION, STERILE IR SOLN
Status: DC | PRN
  Administered 2023-11-23: 1000 mL

## 2023-11-23 MED ORDER — HYDROMORPHONE HCL 1 MG/ML IJ SOLN
INTRAMUSCULAR | Status: AC
  Filled 2023-11-23: qty 1

## 2023-11-23 MED ORDER — CEFAZOLIN SODIUM-DEXTROSE 2-4 GM/100ML-% IV SOLN
2.0000 g | INTRAVENOUS | Status: AC
  Administered 2023-11-23 (×2): 2 g via INTRAVENOUS
  Filled 2023-11-23: qty 100

## 2023-11-23 MED ORDER — PHENYLEPHRINE HCL-NACL 20-0.9 MG/250ML-% IV SOLN
INTRAVENOUS | Status: AC
  Filled 2023-11-23: qty 250

## 2023-11-23 MED ORDER — ALBUMIN HUMAN 5 % IV SOLN: INTRAVENOUS | Status: DC | PRN

## 2023-11-23 MED ORDER — PROCHLORPERAZINE EDISYLATE 10 MG/2ML IJ SOLN
5.0000 mg | Freq: Four times a day (QID) | INTRAMUSCULAR | Status: DC | PRN
  Administered 2023-11-23 – 2023-11-24 (×3): 10 mg via INTRAVENOUS
  Administered 2023-11-26: 5 mg via INTRAVENOUS
  Administered 2023-12-01 – 2024-01-16 (×8): 10 mg via INTRAVENOUS
  Filled 2023-11-23 (×12): qty 2

## 2023-11-23 MED ORDER — ONDANSETRON HCL 4 MG/2ML IJ SOLN: 4.0000 mg | Freq: Four times a day (QID) | INTRAMUSCULAR | Status: DC | PRN

## 2023-11-23 MED ORDER — DEXAMETHASONE SODIUM PHOSPHATE 10 MG/ML IJ SOLN
INTRAMUSCULAR | Status: DC | PRN
  Administered 2023-11-23: 10 mg via INTRAVENOUS

## 2023-11-23 MED ORDER — LACTATED RINGERS IV SOLN
INTRAVENOUS | Status: DC | PRN
Start: 2023-11-23 — End: 2023-11-23

## 2023-11-23 MED ORDER — KCL IN DEXTROSE-NACL 20-5-0.45 MEQ/L-%-% IV SOLN
INTRAVENOUS | Status: AC
  Filled 2023-11-23: qty 1000

## 2023-11-23 MED ORDER — BUPIVACAINE ON-Q PAIN PUMP (FOR ORDER SET NO CHG)
INJECTION | Status: DC
  Filled 2023-11-23: qty 1

## 2023-11-23 MED ORDER — 0.9 % SODIUM CHLORIDE (POUR BTL) OPTIME
TOPICAL | Status: DC | PRN
  Administered 2023-11-23 (×2): 1000 mL

## 2023-11-23 MED ORDER — FENTANYL CITRATE (PF) 250 MCG/5ML IJ SOLN
INTRAMUSCULAR | Status: DC | PRN
  Administered 2023-11-23: 50 ug via INTRAVENOUS
  Administered 2023-11-23: 200 ug via INTRAVENOUS

## 2023-11-23 MED ORDER — CEFAZOLIN SODIUM 1 G IJ SOLR
INTRAMUSCULAR | Status: AC
  Filled 2023-11-23: qty 20

## 2023-11-23 MED ORDER — DIPHENHYDRAMINE HCL 50 MG/ML IJ SOLN: 12.5000 mg | Freq: Four times a day (QID) | INTRAMUSCULAR | Status: DC | PRN

## 2023-11-23 MED ORDER — OXYCODONE HCL 5 MG PO TABS: 5.0000 mg | ORAL_TABLET | Freq: Once | ORAL | Status: DC | PRN

## 2023-11-23 MED ORDER — HYDROMORPHONE HCL 1 MG/ML IJ SOLN
INTRAMUSCULAR | Status: AC
  Filled 2023-11-23: qty 0.5

## 2023-11-23 MED ORDER — BUPIVACAINE 0.25 % ON-Q PUMP DUAL CATH 300 ML
300.0000 mL | INJECTION | Freq: Once | Status: DC
  Filled 2023-11-23: qty 300

## 2023-11-23 MED ORDER — ORAL CARE MOUTH RINSE: 15.0000 mL | OROMUCOSAL | Status: DC | PRN

## 2023-11-23 MED ORDER — ACETAMINOPHEN 10 MG/ML IV SOLN
INTRAVENOUS | Status: DC | PRN
  Administered 2023-11-23: 1000 mg via INTRAVENOUS

## 2023-11-23 MED ORDER — ROCURONIUM BROMIDE 10 MG/ML (PF) SYRINGE
PREFILLED_SYRINGE | INTRAVENOUS | Status: DC | PRN
  Administered 2023-11-23 (×2): 20 mg via INTRAVENOUS
  Administered 2023-11-23: 10 mg via INTRAVENOUS
  Administered 2023-11-23 (×2): 20 mg via INTRAVENOUS
  Administered 2023-11-23 (×2): 10 mg via INTRAVENOUS
  Administered 2023-11-23: 60 mg via INTRAVENOUS
  Administered 2023-11-23: 20 mg via INTRAVENOUS

## 2023-11-23 MED ORDER — SODIUM CHLORIDE 0.9% FLUSH
9.0000 mL | INTRAVENOUS | Status: DC | PRN
Start: 2023-11-23 — End: 2023-11-23

## 2023-11-23 MED ORDER — ORAL CARE MOUTH RINSE: 15.0000 mL | Freq: Once | OROMUCOSAL | Status: AC

## 2023-11-23 MED ORDER — DIPHENHYDRAMINE HCL 12.5 MG/5ML PO ELIX: 12.5000 mg | ORAL_SOLUTION | Freq: Four times a day (QID) | ORAL | Status: DC | PRN

## 2023-11-23 SURGICAL SUPPLY — 124 items
5 PAIRS OF YELLOW SUTURE CLAMP (MISCELLANEOUS) ×2
BAG BILE T-TUBES STRL (MISCELLANEOUS) ×2 IMPLANT
BAG COUNTER SPONGE SURGICOUNT (BAG) ×1 IMPLANT
BIOPATCH RED 1 DISK 7.0 (GAUZE/BANDAGES/DRESSINGS) ×2 IMPLANT
BLADE CLIPPER SURG (BLADE) IMPLANT
BLADE SURG 10 STRL SS (BLADE) ×1 IMPLANT
BOOT SUTURE AID YELLOW STND (SUTURE) IMPLANT
CANISTER SUCT 3000ML PPV (MISCELLANEOUS) ×1 IMPLANT
CATH KIT ON-Q SILVERSOAK 7.5 (CATHETERS) IMPLANT
CATH KIT ON-Q SILVERSOAK 7.5IN (CATHETERS) ×2 IMPLANT
CATH ROBINSON RED A/P 16FR (CATHETERS) IMPLANT
CHLORAPREP W/TINT 26 (MISCELLANEOUS) ×1 IMPLANT
CLAMP SUTURE YELLOW 5 PAIRS (MISCELLANEOUS) ×2 IMPLANT
CLIP LIGATING HEM O LOK PURPLE (MISCELLANEOUS) ×2 IMPLANT
CLIP LIGATING HEMO O LOK GREEN (MISCELLANEOUS) ×1 IMPLANT
CLIP LIGATING HEMOLOK MED (MISCELLANEOUS) ×1 IMPLANT
CLIP TI LARGE 6 (CLIP) ×1 IMPLANT
CLIP TI MEDIUM 24 (CLIP) ×1 IMPLANT
CNTNR URN SCR LID CUP LEK RST (MISCELLANEOUS) IMPLANT
COUNTER NDL 20CT MAGNET RED (NEEDLE) IMPLANT
COVER MAYO STAND STRL (DRAPES) ×1 IMPLANT
COVER SURGICAL LIGHT HANDLE (MISCELLANEOUS) ×1 IMPLANT
DERMABOND ADVANCED .7 DNX12 (GAUZE/BANDAGES/DRESSINGS) ×1 IMPLANT
DRAIN CHANNEL 19F RND (DRAIN) ×2 IMPLANT
DRAIN PENROSE 0.5X18 (DRAIN) IMPLANT
DRAPE LAPAROSCOPIC ABDOMINAL (DRAPES) ×1 IMPLANT
DRAPE WARM FLUID 44X44 (DRAPES) ×1 IMPLANT
DRSG COVADERM 4X10 (GAUZE/BANDAGES/DRESSINGS) IMPLANT
DRSG COVADERM 4X14 (GAUZE/BANDAGES/DRESSINGS) IMPLANT
DRSG COVADERM 4X6 (GAUZE/BANDAGES/DRESSINGS) IMPLANT
DRSG COVADERM 4X8 (GAUZE/BANDAGES/DRESSINGS) IMPLANT
DRSG COVADERM PLUS 2X2 (GAUZE/BANDAGES/DRESSINGS) ×1 IMPLANT
DRSG TEGADERM 4X4.75 (GAUZE/BANDAGES/DRESSINGS) ×1 IMPLANT
DRSG TELFA 3X8 NADH STRL (GAUZE/BANDAGES/DRESSINGS) IMPLANT
ELECT BLADE 4.0 EZ CLEAN MEGAD (MISCELLANEOUS) IMPLANT
ELECT BLADE 6.5 EXT (BLADE) ×1 IMPLANT
ELECT CAUTERY BLADE 6.4 (BLADE) ×1 IMPLANT
ELECT PAD DSPR THERM+ ADLT (MISCELLANEOUS) IMPLANT
ELECT REM PT RETURN 9FT ADLT (ELECTROSURGICAL) ×1 IMPLANT
ELECTRODE BLDE 4.0 EZ CLN MEGD (MISCELLANEOUS) IMPLANT
ELECTRODE REM PT RTRN 9FT ADLT (ELECTROSURGICAL) ×1 IMPLANT
GAUZE 4X4 16PLY ~~LOC~~+RFID DBL (SPONGE) IMPLANT
GAUZE SPONGE 4X4 12PLY STRL (GAUZE/BANDAGES/DRESSINGS) IMPLANT
GEL ULTRASOUND 20GR AQUASONIC (MISCELLANEOUS) IMPLANT
GLOVE BIO SURGEON STRL SZ 6 (GLOVE) ×2 IMPLANT
GLOVE INDICATOR 6.5 STRL GRN (GLOVE) ×2 IMPLANT
GOWN STRL REUS W/ TWL LRG LVL3 (GOWN DISPOSABLE) ×2 IMPLANT
GOWN STRL REUS W/ TWL XL LVL3 (GOWN DISPOSABLE) ×2 IMPLANT
HAND PENCIL TRP OPTION (MISCELLANEOUS) IMPLANT
HANDLE SUCTION POOLE (INSTRUMENTS) IMPLANT
HEMOSTAT SURGICEL 2X14 (HEMOSTASIS) IMPLANT
J-TUBE MIC 16FX51 UNV ENFIT (TUBING) ×1 IMPLANT
KIT BASIN OR (CUSTOM PROCEDURE TRAY) ×1 IMPLANT
KIT MARKER MARGIN INK (KITS) ×1 IMPLANT
KIT TURNOVER KIT B (KITS) ×1 IMPLANT
L-HOOK LAP DISP 36CM (ELECTROSURGICAL) ×1 IMPLANT
LHOOK LAP DISP 36CM (ELECTROSURGICAL) ×1 IMPLANT
LOOP VASCLR MAXI BLUE 18IN ST (MISCELLANEOUS) ×1 IMPLANT
LOOP VESSEL MINI RED (MISCELLANEOUS) IMPLANT
NDL 22X1.5 STRL (OR ONLY) (MISCELLANEOUS) ×1 IMPLANT
NEEDLE 22X1.5 STRL (OR ONLY) (MISCELLANEOUS) ×1 IMPLANT
NS IRRIG 1000ML POUR BTL (IV SOLUTION) ×2 IMPLANT
PACK GENERAL/GYN (CUSTOM PROCEDURE TRAY) ×1 IMPLANT
PAD ARMBOARD 7.5X6 YLW CONV (MISCELLANEOUS) ×2 IMPLANT
PENCIL BUTTON HOLSTER BLD 10FT (ELECTRODE) ×1 IMPLANT
PENCIL SMOKE EVACUATOR (MISCELLANEOUS) ×1 IMPLANT
PLUG CATH AND CAP STRL 200 (CATHETERS) IMPLANT
RELOAD PROXIMATE 75MM BLUE (ENDOMECHANICALS) ×4 IMPLANT
RELOAD PROXIMATE 75MM GREEN (ENDOMECHANICALS) IMPLANT
RELOAD STAPLE 75 3.8 BLU REG (ENDOMECHANICALS) IMPLANT
RELOAD STAPLE 75 4.5 GRN THCK (ENDOMECHANICALS) IMPLANT
SCISSORS LAP 5X35 DISP (ENDOMECHANICALS) IMPLANT
SEPRAFILM PROCEDURAL PACK 3X5 (MISCELLANEOUS) IMPLANT
SET TUBE SMOKE EVAC HIGH FLOW (TUBING) ×1 IMPLANT
SHEARS FOC LG CVD HARMONIC 17C (MISCELLANEOUS) ×1 IMPLANT
SLEEVE SUCTION 125 (MISCELLANEOUS) IMPLANT
SLEEVE SUCTION CATH 165 (SLEEVE) ×1 IMPLANT
SLEEVE Z-THREAD 5X100MM (TROCAR) ×1 IMPLANT
SPIKE FLUID TRANSFER (MISCELLANEOUS) ×2 IMPLANT
SPONGE INTESTINAL PEANUT (DISPOSABLE) IMPLANT
SPONGE LAP 18X18 X RAY DECT (DISPOSABLE) IMPLANT
SPONGE SURGIFOAM ABS GEL 100 (HEMOSTASIS) IMPLANT
SPONGE T-LAP 18X18 ~~LOC~~+RFID (SPONGE) ×2 IMPLANT
STAPLER PROXIMATE 75MM BLUE (STAPLE) ×1 IMPLANT
STAPLER SKIN PROX WIDE 3.9 (STAPLE) IMPLANT
STAPLER VISISTAT 35W (STAPLE) ×1 IMPLANT
SUCTION POOLE HANDLE (INSTRUMENTS) IMPLANT
SUT ETHILON 2 0 FS 18 (SUTURE) ×2 IMPLANT
SUT ETHILON 2 LR (SUTURE) IMPLANT
SUT MNCRL AB 4-0 PS2 18 (SUTURE) ×1 IMPLANT
SUT PDS AB 1 TP1 96 (SUTURE) ×2 IMPLANT
SUT PDS AB 3-0 SH 27 (SUTURE) ×4 IMPLANT
SUT PDS AB 4-0 RB1 27 (SUTURE) ×11 IMPLANT
SUT PDS PLUS AB 5-0 RB-1 (SUTURE) ×5 IMPLANT
SUT PROLENE 3 0 SH 48 (SUTURE) ×3 IMPLANT
SUT PROLENE 4-0 RB1 .5 CRCL 36 (SUTURE) ×2 IMPLANT
SUT PROLENE 5 0 RB 1 DA (SUTURE) IMPLANT
SUT SILK 2 0 SH CR/8 (SUTURE) ×1 IMPLANT
SUT SILK 2 0 TIES 10X30 (SUTURE) ×1 IMPLANT
SUT SILK 3 0 SH CR/8 (SUTURE) ×1 IMPLANT
SUT SILK 3 0 TIES 10X30 (SUTURE) ×1 IMPLANT
SUT VIC AB 2-0 CT1 TAPERPNT 27 (SUTURE) IMPLANT
SUT VIC AB 2-0 SH 18 (SUTURE) IMPLANT
SUT VIC AB 3-0 SH 18 (SUTURE) IMPLANT
SUT VIC AB 3-0 SH 27X BRD (SUTURE) ×1 IMPLANT
SUT VICRYL AB 2 0 TIES (SUTURE) IMPLANT
TAG SUTURE CLAMP YLW 5PR (MISCELLANEOUS) ×2 IMPLANT
TIE VASCULAR MAXI BLUE 18IN ST (MISCELLANEOUS) ×1 IMPLANT
TOWEL GREEN STERILE (TOWEL DISPOSABLE) ×1 IMPLANT
TOWEL GREEN STERILE FF (TOWEL DISPOSABLE) ×1 IMPLANT
TRAY FOLEY MTR SLVR 14FR STAT (SET/KITS/TRAYS/PACK) ×1 IMPLANT
TRAY LAPAROSCOPIC MC (CUSTOM PROCEDURE TRAY) ×1 IMPLANT
TROCAR 11X100 Z THREAD (TROCAR) IMPLANT
TROCAR BALLN 12MMX100 BLUNT (TROCAR) IMPLANT
TROCAR Z-THREAD OPTICAL 5X100M (TROCAR) ×1 IMPLANT
TUBE CONNECTING 12X1/4 (SUCTIONS) ×1 IMPLANT
TUBE JEJUNAL 16FR ENFIT (TUBING) IMPLANT
TUBE NG 5FR 35IN ENFIT (TUBING) IMPLANT
TUBE PU 8FR 16IN ENFIT (TUBING) IMPLANT
TUNNELER SHEATH ON-Q 16GX12 DP (PAIN MANAGEMENT) IMPLANT
VASCULAR TIE MAXI BLUE 18IN ST (MISCELLANEOUS) ×1
VASCULAR TIE MINI RED 18IN STL (MISCELLANEOUS) ×1 IMPLANT
WARMER LAPAROSCOPE (MISCELLANEOUS) ×1 IMPLANT
YANKAUER SUCT BULB TIP NO VENT (SUCTIONS) ×1 IMPLANT

## 2023-11-23 NOTE — Anesthesia Procedure Notes (Signed)
Arterial Line Insertion Start/End12/02/2023 8:00 AM, 11/23/2023 8:15 AM Performed by: Jairo Ben, MD, Kayleen Memos, CRNA  Patient location: OOR procedure area. Preanesthetic checklist: patient identified, IV checked, site marked, risks and benefits discussed, surgical consent, monitors and equipment checked, pre-op evaluation, timeout performed and anesthesia consent Lidocaine 1% used for infiltration Left, radial was placed Catheter size: 20 G Hand hygiene performed   Attempts: 1 Procedure performed without using ultrasound guided technique. Following insertion, dressing applied and Biopatch. Post procedure assessment: normal  Patient tolerated the procedure well with no immediate complications. Additional procedure comments: Performed by Adrienne Mocha.

## 2023-11-23 NOTE — Consult Note (Signed)
CARDIOLOGY CONSULT NOTE       Patient ID: Christine Morse MRN: [id number] DOB/AGE: 02-17-1936 87 y.o.  Admit date: 11/23/2023 Referring Physician: Donell Beers  Primary Physician: [name], MD Primary Cardiologist: Hochrein Reason for Consultation: Hypertrophic Cardiomyopathy  Principal Problem:   Duodenal adenoma   HPI:  87 y.o. who is status post Whipple today for duodenal mass. Was seen by Dr Antoine Poche preoperatively11/6/24. She was on Toprol 25 mg daily. She was followed  since 2014 but characterized as valvular AS. I reviewed her echos including most recent one 10/24/23 and she clearly has HOCM physiology with LVOT gradient/SAM and moderate MR. For her age she has been very functional. No admissions for CHF, PAF, and no history of CAD. Surgery went well. Currently in NSR with two JP drains in abdomen and NT tube. Preoperative Hct 34.4 with normal renal function. Daughter at bedside Pain control is good   ROS All other systems reviewed and negative except as noted above  Past Medical History:  Diagnosis Date   Anemia    Aortic Stenosis    Arthritis    Cyst of ovary, right 2013   "/US" (10/04/2013)   Diverticulitis    Diverticulosis    Duodenal adenoma    GERD (gastroesophageal reflux disease)    History of blood transfusion    "just today; my HgB is 5.1" (10/04/2013)   History of gastric ulcer    History of hiatal hernia    HOCM (hypertrophic obstructive cardiomyopathy) (HCC) 10/24/2023   Hyperlipidemia    Hypertension    Mitral regurgitation    moderate MR 10/24/23   Nephrolithiasis    "passed them on my own; went away after I quit drinking tea" (10/04/2013)   Osteoporosis    Vitamin D deficiency     Family History  Problem Relation Age of Onset   Heart failure Mother 96   Dementia Father    Ovarian cancer Sister    Cancer Brother        LUNG CANCER   Colon cancer Neg Hx     Social History   Socioeconomic History   Marital status: Widowed    Spouse  name: Not on file   Number of children: 2   Years of education: Not on file   Highest education level: Not on file  Occupational History   Occupation: Retired  Tobacco Use   Smoking status: Never   Smokeless tobacco: Never  Vaping Use   Vaping status: Never Used  Substance and Sexual Activity   Alcohol use: No   Drug use: No   Sexual activity: Never  Other Topics Concern   Not on file  Social History Narrative   Lives at home with granddaughter she raised.     Social Determinants of Health   Financial Resource Strain: Not on file  Food Insecurity: No Food Insecurity (10/24/2023)   Hunger Vital Sign    Worried About Running Out of Food in the Last Year: Never true    Ran Out of Food in the Last Year: Never true  Transportation Needs: No Transportation Needs (10/24/2023)   PRAPARE - Administrator, Civil Service (Medical): No    Lack of Transportation (Non-Medical): No  Physical Activity: Not on file  Stress: Not on file  Social Connections: Not on file  Intimate Partner Violence: Not At Risk (10/24/2023)   Humiliation, Afraid, Rape, and Kick questionnaire    Fear of Current or Ex-Partner: No    Emotionally Abused:  No    Physically Abused: No    Sexually Abused: No    Past Surgical History:  Procedure Laterality Date   ABDOMINAL HYSTERECTOMY  1981   BIOPSY  10/25/2023   Procedure: BIOPSY;  Surgeon: Hilarie Fredrickson, MD;  Location: [facility] ENDOSCOPY;  Service: Gastroenterology;;   ESOPHAGOGASTRODUODENOSCOPY N/A 10/04/2013   Procedure: ESOPHAGOGASTRODUODENOSCOPY (EGD);  Surgeon: Hart Carwin, MD;  Location: [facility] ENDOSCOPY;  Service: Endoscopy;  Laterality: N/A;   ESOPHAGOGASTRODUODENOSCOPY (EGD) WITH PROPOFOL N/A 10/25/2023   Procedure: ESOPHAGOGASTRODUODENOSCOPY (EGD) WITH PROPOFOL;  Surgeon: Hilarie Fredrickson, MD;  Location: [facility] ENDOSCOPY;  Service: Gastroenterology;  Laterality: N/A;   LEFT OOPHORECTOMY Left 1981   MALONEY DILATION N/A 10/04/2013   Procedure: Elease Hashimoto  DILATION;  Surgeon: Hart Carwin, MD;  Location: [facility] ENDOSCOPY;  Service: Endoscopy;  Laterality: N/A;      Current Facility-Administered Medications:    0.9 %  sodium chloride infusion, 10 mL/hr, Intravenous, Once, Almond Lint, MD   acetaminophen (OFIRMEV) IV 1,000 mg, 1,000 mg, Intravenous, Q6H, Almond Lint, MD   albumin human 25 % solution 25 g, 25 g, Intravenous, Q6H, Almond Lint, MD   bupivacaine ON-Q pain pump, , Other, Continuous, Almond Lint, MD   ceFAZolin (ANCEF) IVPB 2g/100 mL premix, 2 g, Intravenous, Q8H, Almond Lint, MD   Chlorhexidine Gluconate Cloth 2 % PADS 6 each, 6 each, Topical, Q0600, Almond Lint, MD   dextrose 5 % and 0.45 % NaCl with KCl 20 mEq/L infusion, , Intravenous, Continuous, Almond Lint, MD   fentaNYL (SUBLIMAZE) 100 MCG/2ML injection, , , ,    fentaNYL (SUBLIMAZE) injection 25 mcg, 25 mcg, Intravenous, Q1H PRN, Almond Lint, MD   HYDROmorphone (DILAUDID) 1 MG/ML injection, , , ,    HYDROmorphone (DILAUDID) 1 MG/ML injection, , , ,    methocarbamol (ROBAXIN) injection 500 mg, 500 mg, Intravenous, Q8H PRN, Almond Lint, MD   Oral care mouth rinse, 15 mL, Mouth Rinse, PRN, Almond Lint, MD   pantoprazole (PROTONIX) injection 40 mg, 40 mg, Intravenous, QHS, Almond Lint, MD   prochlorperazine (COMPAZINE) tablet 10 mg, 10 mg, Oral, Q6H PRN **OR** prochlorperazine (COMPAZINE) injection 5-10 mg, 5-10 mg, Intravenous, Q6H PRN, Almond Lint, MD  Chlorhexidine Gluconate Cloth  6 each Topical Q0600   fentaNYL       HYDROmorphone       HYDROmorphone       pantoprazole (PROTONIX) IV  40 mg Intravenous QHS    sodium chloride     acetaminophen     albumin human     bupivacaine ON-Q pain pump      ceFAZolin (ANCEF) IV     dextrose 5 % and 0.45 % NaCl with KCl 20 mEq/L      Physical Exam: Blood pressure 139/67, pulse 75, temperature 97.8 F (36.6 C), resp. rate 14, height 4\' 10"  (1.473 m), weight 55.3 kg, SpO2 95%.    Elderly female NG tube  bloody drainage Lungs clear SEM and MR murmurs  Abdomen post surgery with two JP drains No edema Palpable pedal pulses   Labs:   Lab Results  Component Value Date   WBC 6.3 11/15/2023   HGB 10.6 (L) 11/15/2023   HCT 34.4 (L) 11/15/2023   MCV 101.5 (H) 11/15/2023   PLT 477 (H) 11/15/2023   No results for input(s): "NA", "K", "CL", "CO2", "BUN", "CREATININE", "CALCIUM", "PROT", "BILITOT", "ALKPHOS", "ALT", "AST", "GLUCOSE" in the last 168 hours.  Invalid input(s): "LABALBU" No results found for: "CKTOTAL", "CKMB", "CKMBINDEX", "TROPONINI" No  results found for: "CHOL" No results found for: "HDL" No results found for: "LDLCALC" No results found for: "TRIG" No results found for: "CHOLHDL" No results found for: "LDLDIRECT"    Radiology: DG Chest 2 View  Result Date: 11/07/2023 CLINICAL DATA:  Shortness of breath.  Lower extremity edema. EXAM: CHEST - 2 VIEW COMPARISON:  10/23/2023 FINDINGS: Cardiomegaly. No confluent opacities, effusions or edema. No acute bony abnormality. Aortic atherosclerosis. IMPRESSION: Cardiomegaly.  No active disease. Electronically Signed   By: Charlett Nose M.D.   On: 11/07/2023 22:46   CT ABDOMEN PELVIS W CONTRAST  Result Date: 10/26/2023 CLINICAL DATA:  Inpatient anemia. Large duodenal mass on upper endoscopy. Malignancy suspected. Staging evaluation. * Tracking Code: BO * EXAM: CT ABDOMEN AND PELVIS WITH CONTRAST TECHNIQUE: Multidetector CT imaging of the abdomen and pelvis was performed using the standard protocol following bolus administration of intravenous contrast. RADIATION DOSE REDUCTION: This exam was performed according to the departmental dose-optimization program which includes automated exposure control, adjustment of the mA and/or kV according to patient size and/or use of iterative reconstruction technique. CONTRAST:  OMNIPAQUE IOHEXOL 300 MG/ML  SOLN COMPARISON:  09/19/2017 unenhanced CT abdomen/pelvis. FINDINGS: Lower chest: Small  dependent bilateral pleural effusions with mild dependent bibasilar atelectasis. Hepatobiliary: Normal liver size. At least 3 scattered subcentimeter hypodense liver lesions, too small to characterize, not appreciably changed from 09/19/2017 CT and considered benign. Normal gallbladder with no radiopaque cholelithiasis. No biliary ductal dilatation. CBD diameter 5 mm. Pancreas: Normal, with no mass or duct dilation. Spleen: Normal size. No mass. Adrenals/Urinary Tract: Normal adrenals. No hydronephrosis. Several subcentimeter hypodense renal lesions scattered in both kidneys, too small to characterize, for which no follow-up imaging is recommended. Normal bladder. Stomach/Bowel: Moderate to large hiatal hernia. Stomach is nondistended and otherwise grossly normal. There is an eccentric solid slightly hypoenhancing duodenal mass in the infra-ampullary descending duodenum measuring approximately 4.2 x 2.9 x 3.8 cm (series 2/image 36). Small to moderate left inguinal hernia contains a small bowel loop and a small amount of trapped ascitic fluid. No dilated or thick-walled small bowel loops. No focal small bowel caliber transition. Oral contrast transits to the left colon. Appendix not discretely visualized. Moderate sigmoid diverticulosis with no large bowel wall thickening or significant pericolonic fat stranding. Vascular/Lymphatic: Atherosclerotic nonaneurysmal abdominal aorta. Patent portal, splenic, hepatic and renal veins. Mildly enlarged 1.1 cm central mesenteric node (series 2/image 25). No additional pathologically enlarged lymph nodes in the abdomen or pelvis. Reproductive: Status post hysterectomy, with no abnormal findings at the vaginal cuff. No adnexal mass. Other: No pneumoperitoneum, ascites or focal fluid collection. Musculoskeletal: No aggressive appearing focal osseous lesions. Marked multilevel lumbar degenerative disc disease. IMPRESSION: 1. Eccentric solid hypoenhancing duodenal mass in the  infra-ampullary descending duodenum measuring approximately 4.2 x 2.9 x 3.8 cm, suspicious for primary duodenal malignancy. 2. Mildly enlarged 1.1 cm central mesenteric node, potentially a nodal metastasis. No additional findings of metastatic disease in the abdomen or pelvis. 3. Small to moderate left inguinal hernia contains a small bowel loop and a small amount of trapped ascitic fluid. No evidence of acute bowel complication. 4. Moderate to large hiatal hernia. 5. Moderate sigmoid diverticulosis. 6. Small dependent bilateral pleural effusions. 7.  Aortic Atherosclerosis (ICD10-I70.0). Electronically Signed   By: Delbert Phenix M.D.   On: 10/26/2023 13:25    EKG: SR voltage LVH poor R wave progression   ASSESSMENT AND PLAN:   HOCM:  stable she is NPO and will need iv beta blocker to  replace her oral Rx as she is not likely to be able to take PO for a while Avoid over diuresis and consider transfusion for Hb 9 slightly higher than usual to avoid low LV filling volumes which would make outflow tract gradient worse. Given age/HOCM at risk for PAF but currently in NSR.  HLD:  can hold statin  Postoperative:  ? Will need PPN follow Hct. Drain removal per surgery Pain control seems good sats 95% No mention of cancer but pathology likely pending   Signed: Charlton Haws 11/23/2023, 5:37 PM

## 2023-11-23 NOTE — Op Note (Addendum)
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   PROCEDURES PERFORMED:  Diagnostic laparoscopy  Classic pancreaticoduodenectomy   Placement of pancreatic duct stent   SURGEON: Almond Lint, MD   ASSISTANT: Harden Mo, MD Jeronimo Greaves, RNFA   ANESTHESIA: General and epidural   FINDINGS: large duodenal mass 4-5 cm. Very soft pancreatic tissue. 9 mm common bile duct. 5 mm pancreatic duct. Moderate sized left inguinal hernia with small bowel. Small replaced right hepatic artery was located and required division. Large node at LOT.    SPECIMENS:   Liver nodule left lateral segment Cystic liver nodule left lateral segment 3.   Pancreaticoduodenectomy with gallbladder:  4.   Node at ligament of trietz 5.  Mesenteric nodule 6. Proximal jejunum  ESTIMATED BLOOD LOSS: 300 mL.   COMPLICATIONS: None known.   PROCEDURE:   Pt was identified in the holding area and taken to  the operating room, and placed supine on the operating room  table. General anesthesia was induced. The patient's abdomen was  prepped and draped in a sterile fashion, after a Foley catheter was  placed. A time-out was performed according to the surgical safety check  list. When all was correct we continued.   The patient was placed in reverse trendelenburg position and rotated to the right.  The left subcostal margin was anesthetized with local anesthesia.  A 5 mm optiview trocar was placed under direct visualization.  The abdomen was insufflated with carbon dioxide.  The abdomen was examined.  A second port was placed in the upper midline to be able to better visualize the right liver.  A few small liver nodules were seen. A third port was placed in the Left mid abdomen.  The liver nodules were biopsied.  Frozen section returned as benign.    A midline incision was made from the xiphoid to just above the umbilicus. The subcutaneous  tissues were divided with the Bovie cautery. The peritoneum was entered in the center of the abdomen. Digital retraction was then used to elevate the preperitoneal fat, and this was taken with the cautery as well. Care was taken to protect the underlying viscera.   The Bookwalter self-retaining retractor was placed to assist with visualization. The right colon was taken down off of the white line  of Toldt and from the retroperitoneum at the hepatic flexure. The porta was identified. The duodenum was kocherized extensively with blunt dissection and with cautery. The duodenal mass was large and appeared malignant.  A portion of transverse colon mesentery was taken due to involvement with tumor.  The gallbladder was taken off the liver with a combination of blunt dissection and cautery. The cystic duct was clipped with the Hemalock clips. The cystic duct was divided and the gallbladder was passed off.   The common bile duct was skeletonized near the duodenum. A vessel loop was passed around it. The gastroduodenal artery, as well as the common hepatic artery were skeletonized. The proper hepatic artery was traced out to make sure that flow was going to both sides of the liver when the GDA was clamped. The GDA was test clamped with the bulldog, with good flow to the liver and  no signs of ischemia. This was divided with 2-0 silk ties and then clipped. The proper hepatic artery was reflected upward, and the anterior portal vein was exposed. A Kelly clamp was passed underneath the pancreas at the superior mesenteric vein, and this passed  easily with no signs of tumor involvement.   Attention was then directed to the stomach, and the omentum was taken  off of the stomach at the border of the antrum and the body. The  gastrohepatic ligament was taken down with the harmonic, and care was  taken to make sure there was not a replaced left hepatic artery in this  location. The stomach was divided with the GIA-75  stapler. The border  of the stomach was oversewn with a 3-0 running PDS suture.   Attention was then directed to the small bowel. Around 10 cm past the  ligament of Treitz was located, and this was divided with the 75-GIA.  The distal portion of the jejunum was also oversewn with a 3-0 PDS  suture. The fourth portion of the duodenum was skeletonized with the  harmonic scalpel, taking down all of the mesenteric vessels. The  ligament of Treitz was taken down. The IMV was preserved.  The duodenum was then passed underneath the portal vein.   At this point the Tresa Endo was replaced and the pancreas was divided with the cautery. 2-0 silk sutures were tied down and the inferior and superior border of the pancreas. The Bovie was used to coagulate the small bleeders at the border of the pancreas. The common bile duct was clamped with a bulldog and divided sharply.  The Overholt in combination with the harmonic and locking Weck clips  were then used to take the uncinate process off of the portal vein and  the superior mesenteric artery. Care was taken not to incorporate the  superior mesenteric artery in the dissection. The specimen was then marked and passed off the table for frozen section margin.   The jejunum was then passed underneath  the SMV in order to get appropriate lie for the pancreatic and biliary  anastomoses. The more distal portion of the jejunum was pulled up over  the colon, and two 3-0 silks were placed through the posterior border  of the stomach for the gastrojejunostomy. The stomach and the small  bowel were opened, and a GIA-75 was used to create an end-to-end  anastomosis. The open areas of the staple line were examined to ensure  that there was hemostasis. The defect was then closed with a single  layer of running Connell suture of 3-0 PDS. Prior to a complete  closure, the NG tube was passed toward the afferent limb.   The appropriate location for the choledochojejunostomy  was identified, and  the small bowel was opened approximately 10 mm. The anastamosis was created with approximately 11 4-0 interrupted PDS sutures.   The 2 corner sutures were placed first and then the posterior layer was done in an interrupted fashion tying on the inside. The superior layer was then closed with interrupted sutures as well.   At this point the frozens returned back as all negative. The pancreatic  anastomosis was then created by opening the jejunum the length  of the pancreatic parenchyma. The pancreas was very soft, and the duct was around 4-5 mm. A pediatric feeding tube was used as a pancreatic stent. The posterior layer was formed first with 2-0 silk sutures in interrupted fashion. Five 5-0 PDS sutures were used to create a duct to mucosa pancreaticojejunostomy.   The anterior layer was then oversewn with 2-0 silks to dunk the pancreatic parenchyma. This was difficult due to the poor quality of the pancreatic tissue.  Additional supporting 2-0 silk sutures were placed into the surrounding  bowel to hold to the mesentery to minimize pulling on the pancreatic tissue.    The areas were then irrigated and then those anastomoses were covered with Vistaseal. This was allowed to dry. The abdomen was then irrigated  again and all the laparotomy sponges were removed. A lap count was  performed, which was correct. Two 19-Blake drains were placed, with the  RIGHT drain placed behind the choledochojejunostomy. The left sided  Blake drain was placed just anterior and slightly superior to the  pancreaticojejunostomy. The OnQ catheters were placed in the pre peritoneal space on either side of the incision.  The fascia was then closed with #1 looped running PDS sutures. The skin was irrigated and then closed with  staples. The wounds were cleaned, dried and dressed with a sterile  dressing.   The patient tolerated the procedure well and was extubated and taken to  PACU in stable condition.  Needle and sponge counts were correct x2.

## 2023-11-23 NOTE — Anesthesia Procedure Notes (Signed)
Procedure Name: Intubation Date/Time: 11/23/2023 9:40 AM  Performed by: Kayleen Memos, CRNAPre-anesthesia Checklist: Patient identified, Emergency Drugs available, Suction available and Patient being monitored Patient Re-evaluated:Patient Re-evaluated prior to induction Oxygen Delivery Method: Circle System Utilized Preoxygenation: Pre-oxygenation with 100% oxygen Induction Type: IV induction Ventilation: Mask ventilation without difficulty Laryngoscope Size: Miller and 2 Grade View: Grade I Tube type: Oral Tube size: 7.0 mm Number of attempts: 1 Airway Equipment and Method: Stylet and Oral airway Placement Confirmation: ETT inserted through vocal cords under direct vision, positive ETCO2 and breath sounds checked- equal and bilateral Secured at: 22 cm Tube secured with: Tape Dental Injury: Teeth and Oropharynx as per pre-operative assessment

## 2023-11-23 NOTE — Transfer of Care (Signed)
Immediate Anesthesia Transfer of Care Note  Patient: Christine Morse  Procedure(s) Performed: LAPAROSCOPY DIAGNOSTIC (Abdomen) WHIPPLE PROCEDURE (Abdomen)  Patient Location: PACU  Anesthesia Type:General  Level of Consciousness: awake and drowsy  Airway & Oxygen Therapy: Patient Spontanous Breathing and Patient connected to face mask oxygen  Post-op Assessment: Report given to RN and Post -op Vital signs reviewed and stable  Post vital signs: Reviewed and stable  Last Vitals:  Vitals Value Taken Time  BP 144/59 11/23/23 1447  Temp    Pulse 71 11/23/23 1453  Resp 27 11/23/23 1453  SpO2 99 % 11/23/23 1453  Vitals shown include unfiled device data.  Last Pain:  Vitals:   11/23/23 0725  TempSrc:   PainSc: 0-No pain      Patients Stated Pain Goal: 0 (11/23/23 0725)  Complications: No notable events documented.

## 2023-11-23 NOTE — Anesthesia Postprocedure Evaluation (Signed)
Anesthesia Post Note  Patient: Christine Morse  Procedure(s) Performed: LAPAROSCOPY DIAGNOSTIC (Abdomen) WHIPPLE PROCEDURE (Abdomen)     Patient location during evaluation: PACU Anesthesia Type: General Level of consciousness: awake and alert Pain management: pain level controlled Vital Signs Assessment: post-procedure vital signs reviewed and stable Respiratory status: spontaneous breathing, nonlabored ventilation, respiratory function stable and patient connected to nasal cannula oxygen Cardiovascular status: blood pressure returned to baseline and stable Postop Assessment: no apparent nausea or vomiting Anesthetic complications: no  No notable events documented.  Last Vitals:  Vitals:   11/23/23 1545 11/23/23 1600  BP: 135/67 139/67  Pulse: 77 75  Resp: 12 14  Temp:  36.6 C  SpO2: 94% 95%    Last Pain:  Vitals:   11/23/23 1447  TempSrc:   PainSc: Asleep                 Yun Gutierrez S

## 2023-11-23 NOTE — Interval H&P Note (Signed)
History and Physical Interval Note:  11/23/2023 8:28 AM  Christine Morse  has presented today for surgery, with the diagnosis of DUODENAL ADENOMA.  The various methods of treatment have been discussed with the patient and family. After consideration of risks, benefits and other options for treatment, the patient has consented to  Procedure(s) with comments: LAPAROSCOPY DIAGNOSTIC (N/A) - 8 HOURS ROOM 10 WHIPPLE PROCEDURE (N/A) as a surgical intervention.  The patient's history has been reviewed, patient examined, no change in status, stable for surgery.  I have reviewed the patient's chart and labs.  Questions were answered to the patient's satisfaction.     Almond Lint

## 2023-11-24 ENCOUNTER — Encounter (HOSPITAL_COMMUNITY): Payer: Self-pay | Admitting: General Surgery

## 2023-11-24 DIAGNOSIS — I421 Obstructive hypertrophic cardiomyopathy: Secondary | ICD-10-CM | POA: Diagnosis not present

## 2023-11-24 LAB — CBC
HCT: 31.8 % — ABNORMAL LOW (ref 36.0–46.0)
Hemoglobin: 10.3 g/dL — ABNORMAL LOW (ref 12.0–15.0)
MCH: 30.6 pg (ref 26.0–34.0)
MCHC: 32.4 g/dL (ref 30.0–36.0)
MCV: 94.4 fL (ref 80.0–100.0)
Platelets: 287 10*3/uL (ref 150–400)
RBC: 3.37 MIL/uL — ABNORMAL LOW (ref 3.87–5.11)
RDW: 17.1 % — ABNORMAL HIGH (ref 11.5–15.5)
WBC: 9.7 10*3/uL (ref 4.0–10.5)
nRBC: 0 % (ref 0.0–0.2)

## 2023-11-24 LAB — COMPREHENSIVE METABOLIC PANEL
ALT: 34 U/L (ref 0–44)
AST: 45 U/L — ABNORMAL HIGH (ref 15–41)
Albumin: 3.1 g/dL — ABNORMAL LOW (ref 3.5–5.0)
Alkaline Phosphatase: 29 U/L — ABNORMAL LOW (ref 38–126)
Anion gap: 5 (ref 5–15)
BUN: 12 mg/dL (ref 8–23)
CO2: 22 mmol/L (ref 22–32)
Calcium: 7.5 mg/dL — ABNORMAL LOW (ref 8.9–10.3)
Chloride: 110 mmol/L (ref 98–111)
Creatinine, Ser: 0.54 mg/dL (ref 0.44–1.00)
GFR, Estimated: >60 mL/min (ref >60)
Glucose, Bld: 153 mg/dL — ABNORMAL HIGH (ref 70–99)
Potassium: 3.7 mmol/L (ref 3.5–5.1)
Sodium: 137 mmol/L (ref 135–145)
Total Bilirubin: 1.3 mg/dL — ABNORMAL HIGH (ref <1.2)
Total Protein: 4.3 g/dL — ABNORMAL LOW (ref 6.5–8.1)

## 2023-11-24 LAB — PROTIME-INR
INR: 1.4 — ABNORMAL HIGH (ref 0.8–1.2)
Prothrombin Time: 17.4 s — ABNORMAL HIGH (ref 11.4–15.2)

## 2023-11-24 LAB — PHOSPHORUS: Phosphorus: 2.6 mg/dL (ref 2.5–4.6)

## 2023-11-24 LAB — MAGNESIUM: Magnesium: 1.7 mg/dL (ref 1.7–2.4)

## 2023-11-24 LAB — GLUCOSE, CAPILLARY
Glucose-Capillary: 109 mg/dL — ABNORMAL HIGH (ref 70–99)
Glucose-Capillary: 112 mg/dL — ABNORMAL HIGH (ref 70–99)
Glucose-Capillary: 108 mg/dL — ABNORMAL HIGH (ref 70–99)

## 2023-11-24 MED ORDER — INSULIN ASPART 100 UNIT/ML IJ SOLN: 0.0000 [IU] | Freq: Three times a day (TID) | INTRAMUSCULAR | Status: DC

## 2023-11-24 MED ORDER — KCL IN DEXTROSE-NACL 20-5-0.45 MEQ/L-%-% IV SOLN
INTRAVENOUS | Status: AC
  Filled 2023-11-24 (×3): qty 1000

## 2023-11-24 MED ORDER — MAGNESIUM SULFATE 2 GM/50ML IV SOLN
2.0000 g | Freq: Once | INTRAVENOUS | Status: AC
  Administered 2023-11-24: 2 g via INTRAVENOUS
  Filled 2023-11-24: qty 50

## 2023-11-24 NOTE — Progress Notes (Signed)
1 Day Post-Op   Subjective/Chief Complaint: Pain ok.  Wants to get out of bed.     Objective: Vital signs in last 24 hours: Temp:  [96.8 F (36 C)-98.5 F (36.9 C)] 98.4 F (36.9 C) (12/04 0400) Pulse Rate:  [59-84] 84 (12/04 0800) Resp:  [11-24] 21 (12/04 0800) BP: (117-164)/(52-91) 136/61 (12/04 0800) SpO2:  [92 %-99 %] 97 % (12/04 0800) Arterial Line BP: (59-209)/(48-144) 59/55 (12/04 0600) Last BM Date :  (pta)  Intake/Output from previous day: 12/03 0701 - 12/04 0700 In: 5331.1 [I.V.:3447.1; Blood:630; IV Piggyback:1254] Out: 2040 [Urine:515; Drains:925; Blood:600] Intake/Output this shift: Total I/O In: 81.7 [I.V.:50; IV Piggyback:31.7] Out: 50 [Urine:50]  General appearance: alert, cooperative, and no distress Resp: breathing comfortably GI: soft, non distended, approp tender. Drains bloody, onQ in place Extremities: extremities normal, atraumatic, no cyanosis or edema  Lab Results:  Recent Labs    11/24/23 0512  WBC 9.7  HGB 10.3*  HCT 31.8*  PLT 287   BMET Recent Labs    11/24/23 0405  NA 137  K 3.7  CL 110  CO2 22  GLUCOSE 153*  BUN 12  CREATININE 0.54  CALCIUM 7.5*   PT/INR Recent Labs    11/24/23 0405  LABPROT 17.4*  INR 1.4*   ABG No results for input(s): "PHART", "HCO3" in the last 72 hours.  Invalid input(s): "PCO2", "PO2"  Studies/Results: No results found.  Anti-infectives: Anti-infectives (From admission, onward)    Start     Dose/Rate Route Frequency Ordered Stop   11/23/23 2100  ceFAZolin (ANCEF) IVPB 2g/100 mL premix        2 g 200 mL/hr over 30 Minutes Intravenous Every 8 hours 11/23/23 1654 11/23/23 2214   11/23/23 0700  ceFAZolin (ANCEF) IVPB 2g/100 mL premix        2 g 200 mL/hr over 30 Minutes Intravenous On call to O.R. 11/23/23 0654 11/23/23 1249       Assessment/Plan: s/p Procedure(s) with comments: LAPAROSCOPY DIAGNOSTIC (N/A) - 8 HOURS ROOM 10 WHIPPLE PROCEDURE (N/A) Do not remove foley - marginal  uop NGT at least until tomorrow- STRICT npo, flush NGT, IV fluids HOCM - appreciate cards input.  Metoprolol iv until oral beta blocker can be started Anemia of chronic disease, chronic blood loss anemia, and acute blood loss anemia - stable.  Hyperglycemia - check fingersticks. 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  Standing 25% albumin x 48 hours given patient fragile volume status Drains high output, but had quite a bit of irrigation.   Pain control with OnQ pain pump, standing tylenol, prn robaxin, prn fentanyl.  OOB later today Hold on lovenox given bloody drain output. Hope to start tomorrow.     LOS: 1 day    Almond Lint 11/24/2023

## 2023-11-24 NOTE — Progress Notes (Signed)
Left radial art line not correlating with cuff pressures and would not draw back for all of morning labs. Dr Hillery Hunter contacted. Per MD, ok to go by cuff pressure for BP, but leave art line in for now. MD also notified of bloody drain output of 300 cc from right JP drain and 375 cc from left JP drain (from 1900 to 0400). Will await am labs, no further orders at this time.   Roxy Horseman

## 2023-11-24 NOTE — Progress Notes (Signed)
   Cardiologist:  Hochrein  Subjective:  Denies SSCP, palpitations or Dyspnea   Objective:  Vitals:   11/24/23 0500 11/24/23 0600 11/24/23 0700 11/24/23 0800  BP: (!) 130/57 (!) 133/55 (!) 138/54 136/61  Pulse: 78 73 76 84  Resp: (!) 22 17 14  (!) 21  Temp:      TempSrc:      SpO2: 97% 98% 92% 97%  Weight:      Height:        Intake/Output from previous day:  Intake/Output Summary (Last 24 hours) at 11/24/2023 0847 Last data filed at 11/24/2023 0800 Gross per 24 hour  Intake 5412.73 ml  Output 2090 ml  Net 3322.73 ml    Physical Exam:  Elderly female NG tube SEM Lungs clear Two JP drains in abdomen  Trace edema  Lab Results: Basic Metabolic Panel: Recent Labs    11/24/23 0405  NA 137  K 3.7  CL 110  CO2 22  GLUCOSE 153*  BUN 12  CREATININE 0.54  CALCIUM 7.5*  MG 1.7  PHOS 2.6   Liver Function Tests: Recent Labs    11/24/23 0405  AST 45*  ALT 34  ALKPHOS 29*  BILITOT 1.3*  PROT 4.3*  ALBUMIN 3.1*   No results for input(s): "LIPASE", "AMYLASE" in the last 72 hours. CBC: Recent Labs    11/24/23 0512  WBC 9.7  HGB 10.3*  HCT 31.8*  MCV 94.4  PLT 287     Imaging: No results found.  Cardiac Studies:  ECG: SR voltage for LVH poor R wave progression   Telemetry: NSR rates in 80's   Echo: 10/24/23 HOCM peak LVOT velocity rest 3.3 m/sec severe LAE moderate MR   Medications:    Chlorhexidine Gluconate Cloth  6 each Topical Q0600   metoprolol tartrate  5 mg Intravenous Q6H   pantoprazole (PROTONIX) IV  40 mg Intravenous QHS      acetaminophen Stopped (11/24/23 0531)   albumin human Stopped (11/24/23 0731)   bupivacaine ON-Q pain pump Stopped (11/23/23 2010)   dextrose 5 % and 0.45 % NaCl with KCl 20 mEq/L 50 mL/hr at 11/23/23 2234   magnesium sulfate bolus IVPB 2 g (11/24/23 0804)    Assessment/Plan:  HOCM:  stable she is NPO and will need iv beta blocker to replace her oral Rx as she is not likely to be able to take PO for a  while Avoid over diuresis and consider transfusion for Hb 9 slightly higher than usual to avoid low LV filling volumes which would make outflow tract gradient worse. Given age/HOCM at risk for PAF but currently in NSR. Hb stable post op day one surprising given bloody output from NG tube and JP drains I/O's positive 3 L's may need PRN diuretic if this trend continues would aim for even I/O's or slightly positive since NPO HLD:  can hold statin  Postoperative:  ? Will need PPN follow Hct. Drain removal per surgery Pain control seems good sats 95% No mention of cancer but pathology likely pending   Charlton Haws 11/24/2023, 8:47 AM

## 2023-11-25 DIAGNOSIS — I421 Obstructive hypertrophic cardiomyopathy: Secondary | ICD-10-CM | POA: Diagnosis not present

## 2023-11-25 LAB — GLUCOSE, CAPILLARY
Glucose-Capillary: 120 mg/dL — ABNORMAL HIGH (ref 70–99)
Glucose-Capillary: 130 mg/dL — ABNORMAL HIGH (ref 70–99)
Glucose-Capillary: 123 mg/dL — ABNORMAL HIGH (ref 70–99)
Glucose-Capillary: 121 mg/dL — ABNORMAL HIGH (ref 70–99)

## 2023-11-25 LAB — AMYLASE, BODY FLUID (OTHER)
Amylase, Body Fluid: >75000 U/L
Amylase, Body Fluid: >75000 U/L

## 2023-11-25 LAB — PHOSPHORUS: Phosphorus: 1.3 mg/dL — ABNORMAL LOW (ref 2.5–4.6)

## 2023-11-25 LAB — CBC
HCT: 29.2 % — ABNORMAL LOW (ref 36.0–46.0)
Hemoglobin: 9.1 g/dL — ABNORMAL LOW (ref 12.0–15.0)
MCH: 29.7 pg (ref 26.0–34.0)
MCHC: 31.2 g/dL (ref 30.0–36.0)
MCV: 95.4 fL (ref 80.0–100.0)
Platelets: 206 10*3/uL (ref 150–400)
RBC: 3.06 MIL/uL — ABNORMAL LOW (ref 3.87–5.11)
RDW: 16.7 % — ABNORMAL HIGH (ref 11.5–15.5)
WBC: 15 10*3/uL — ABNORMAL HIGH (ref 4.0–10.5)
nRBC: 0 % (ref 0.0–0.2)

## 2023-11-25 LAB — COMPREHENSIVE METABOLIC PANEL
ALT: 24 U/L (ref 0–44)
AST: 29 U/L (ref 15–41)
Albumin: 3.9 g/dL (ref 3.5–5.0)
Alkaline Phosphatase: 35 U/L — ABNORMAL LOW (ref 38–126)
Anion gap: 9 (ref 5–15)
BUN: 10 mg/dL (ref 8–23)
CO2: 22 mmol/L (ref 22–32)
Calcium: 8.3 mg/dL — ABNORMAL LOW (ref 8.9–10.3)
Chloride: 106 mmol/L (ref 98–111)
Creatinine, Ser: 0.43 mg/dL — ABNORMAL LOW (ref 0.44–1.00)
GFR, Estimated: >60 mL/min (ref >60)
Glucose, Bld: 118 mg/dL — ABNORMAL HIGH (ref 70–99)
Potassium: 3.7 mmol/L (ref 3.5–5.1)
Sodium: 137 mmol/L (ref 135–145)
Total Bilirubin: 1.7 mg/dL — ABNORMAL HIGH (ref <1.2)
Total Protein: 5 g/dL — ABNORMAL LOW (ref 6.5–8.1)

## 2023-11-25 LAB — PROTIME-INR
INR: 1.3 — ABNORMAL HIGH (ref 0.8–1.2)
Prothrombin Time: 16.1 s — ABNORMAL HIGH (ref 11.4–15.2)

## 2023-11-25 LAB — MAGNESIUM: Magnesium: 2 mg/dL (ref 1.7–2.4)

## 2023-11-25 MED ORDER — HALOPERIDOL LACTATE 5 MG/ML IJ SOLN
2.0000 mg | Freq: Three times a day (TID) | INTRAMUSCULAR | Status: DC | PRN
  Administered 2023-11-26 – 2024-01-17 (×13): 2 mg via INTRAVENOUS
  Filled 2023-11-25 (×13): qty 1

## 2023-11-25 MED ORDER — POTASSIUM PHOSPHATES 15 MMOLE/5ML IV SOLN
30.0000 mmol | Freq: Once | INTRAVENOUS | Status: AC
  Administered 2023-11-25: 30 mmol via INTRAVENOUS
  Filled 2023-11-25: qty 10

## 2023-11-25 MED ORDER — ONDANSETRON HCL 4 MG/2ML IJ SOLN
4.0000 mg | Freq: Four times a day (QID) | INTRAMUSCULAR | Status: DC | PRN
  Administered 2023-11-26 – 2024-01-16 (×19): 4 mg via INTRAVENOUS
  Filled 2023-11-25 (×21): qty 2

## 2023-11-25 MED ORDER — METOPROLOL TARTRATE 5 MG/5ML IV SOLN
5.0000 mg | INTRAVENOUS | Status: DC
  Administered 2023-11-25 – 2023-11-28 (×18): 5 mg via INTRAVENOUS
  Filled 2023-11-25 (×18): qty 5

## 2023-11-25 NOTE — Progress Notes (Signed)
   Cardiologist:  Hochrein  Subjective:  Denies SSCP, palpitations or Dyspnea   Objective:  Vitals:   11/25/23 0700 11/25/23 0800 11/25/23 0900 11/25/23 1000  BP: (!) 147/56 (!) 157/69 (!) 154/65 (!) 150/74  Pulse: 82 (!) 101 91 92  Resp: (!) 24 (!) 27 (!) 23 (!) 28  Temp:  98.1 F (36.7 C)    TempSrc:  Axillary    SpO2: 94% 94% 94% 92%  Weight:      Height:        Intake/Output from previous day:  Intake/Output Summary (Last 24 hours) at 11/25/2023 1013 Last data filed at 11/25/2023 0900 Gross per 24 hour  Intake 1545.71 ml  Output 1360 ml  Net 185.71 ml    Physical Exam:  Elderly female NG tube SEM Lungs clear Two JP drains in abdomen  Trace edema  Lab Results: Basic Metabolic Panel: Recent Labs    11/24/23 0405 11/25/23 0510  NA 137 137  K 3.7 3.7  CL 110 106  CO2 22 22  GLUCOSE 153* 118*  BUN 12 10  CREATININE 0.54 0.43*  CALCIUM 7.5* 8.3*  MG 1.7 2.0  PHOS 2.6 1.3*   Liver Function Tests: Recent Labs    11/24/23 0405 11/25/23 0510  AST 45* 29  ALT 34 24  ALKPHOS 29* 35*  BILITOT 1.3* 1.7*  PROT 4.3* 5.0*  ALBUMIN 3.1* 3.9   No results for input(s): "LIPASE", "AMYLASE" in the last 72 hours. CBC: Recent Labs    11/24/23 0512 11/25/23 0510  WBC 9.7 15.0*  HGB 10.3* 9.1*  HCT 31.8* 29.2*  MCV 94.4 95.4  PLT 287 206     Imaging: No results found.  Cardiac Studies:  ECG: SR voltage for LVH poor R wave progression   Telemetry: NSR rates in 80's   Echo: 10/24/23 HOCM peak LVOT velocity rest 3.3 m/sec severe LAE moderate MR   Medications:    Chlorhexidine Gluconate Cloth  6 each Topical Q0600   insulin aspart  0-6 Units Subcutaneous TID WC   metoprolol tartrate  5 mg Intravenous Q6H   pantoprazole (PROTONIX) IV  40 mg Intravenous QHS      albumin human Stopped (11/25/23 0709)   bupivacaine ON-Q pain pump Stopped (11/23/23 2010)   dextrose 5 % and 0.45 % NaCl with KCl 20 mEq/L 50 mL/hr at 11/24/23 2100   potassium  PHOSPHATE IVPB (in mmol) 85 mL/hr at 11/25/23 0900    Assessment/Plan:  HOCM:  stable she is NPO and will need iv beta blocker to replace her oral Rx as she is not likely to be able to take PO for a while Avoid over diuresis and consider transfusion for Hb 9  She is 9.1 today and has dropped Will defer to surgery. I/O's about even so no need for diretic today. Continue iv lopressor change to q 4 hours as HR is higher likely reflecting pain/anemia  HLD:  can hold statin  Postoperative:  ? Will need PPN follow Hct. Drain removal per surgery Pain control seems good sats 95% No mention of cancer but pathology likely pending   Charlton Haws 11/25/2023, 10:13 AM

## 2023-11-25 NOTE — Evaluation (Signed)
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  Clinical Impression  Patient presents with decreased mobility due to pain, limited activity tolerance, decreased balance and decreased strength.  She was previously home and independent with grandaughter living with her.  She completed all IADL's.  Currently min A for mobility in the room and needing supplemental O2 to prevent hypoxia.  PT will continue to follow in the acute setting.  Hopeful for progression to allow home with family support and HHPT.         If plan is discharge home, recommend the following: A little help with walking and/or transfers;Assistance with cooking/housework;Assist for transportation;Help with stairs or ramp for entrance;A little help with bathing/dressing/bathroom   Can travel by private vehicle        Equipment Recommendations Rolling walker (2 wheels);BSC/3in1  Recommendations for Other Services       Functional Status Assessment Patient has had a recent decline in their functional status and demonstrates the ability to make significant improvements in function in a reasonable and predictable amount of time.     Precautions / Restrictions Precautions Precautions: Fall Precaution Comments: bilat Blake drains and On-Q pump      Mobility  Bed Mobility Overal bed mobility: Needs Assistance Bed Mobility: Rolling, Sidelying to Sit Rolling: Used rails, Contact guard assist Sidelying to sit: Contact guard assist, HOB elevated, Used rails       General bed mobility comments: cues for technique, assist for lines    Transfers Overall transfer level: Needs assistance Equipment  used: Rolling walker (2 wheels) Transfers: Sit to/from Stand Sit to Stand: Min assist           General transfer comment: up to stand to RW with cues for hand placement and assist for balance    Ambulation/Gait Ambulation/Gait assistance: Min assist Gait Distance (Feet): 13 Feet Assistive device: Rolling walker (2 wheels) Gait Pattern/deviations: Step-to pattern, Step-through pattern, Shuffle, Decreased stride length       General Gait Details: shuffling steps with RW around bed to recliner, assist for lines and walker safety  Stairs            Wheelchair Mobility     Tilt Bed    Modified Rankin (Stroke Patients Only)       Balance Overall balance assessment: Needs assistance Sitting-balance support: Feet supported Sitting balance-[name]: Fair     Standing balance support: Bilateral upper extremity supported Standing balance-[name]: Poor Standing balance comment: imbalance needing assist with RW and CGA for static balance                             Pertinent Vitals/Pain Pain Assessment Pain Assessment: Faces Faces Pain Scale: Hurts little more Pain Location: operative site with mobility, denies rest pain Pain Descriptors / Indicators: Discomfort, Grimacing, Operative site guarding Pain Intervention(s): Monitored during session, Repositioned, Premedicated before session    Home Living Family/patient expects to be discharged to:: Private residence Living Arrangements: Other relatives Engineer, building services) Available Help at Discharge: Family;Available 24 hours/day Type of Home: House Home Access: Stairs to enter Entrance Stairs-Rails: Doctor, general practice of Steps: 6   Home Layout: One level Home Equipment:  Shower seat      Prior Function Prior Level of Function : Independent/Modified Independent;Driving             Mobility Comments: pt reports ind, no AD ADLs Comments: pt reports ind with ADLs/IADLs, drives  locally, children provide transportation for appointments out of town     Extremity/Trunk Assessment   Upper Extremity Assessment Upper Extremity Assessment: Generalized weakness    Lower Extremity Assessment Lower Extremity Assessment: Generalized weakness    Cervical / Trunk Assessment Cervical / Trunk Assessment: Other exceptions Cervical / Trunk Exceptions: abdominal surgery  Communication   Communication Communication: No apparent difficulties  Cognition Arousal: Alert Behavior During Therapy: Impulsive Overall Cognitive Status: Within Functional Limits for tasks assessed                                 General Comments: eager to get up, RN reports also a little groggy with painmeds, but pt oriented        General Comments General comments (skin integrity, edema, etc.): drains pinned to gown, grandaughter in the room initially, on 3L O2 at rest, removed for mobility due to line too short and spO2 down to 83% walking around bed on RA, replaced O2 at rest and maintained 92-93%.    Exercises     Assessment/Plan    PT Assessment Patient needs continued PT services  PT Problem List Decreased balance;Decreased strength;Decreased activity tolerance;Decreased safety awareness;Decreased knowledge of use of DME;Decreased mobility       PT Treatment Interventions DME instruction;Therapeutic activities;Gait training;Therapeutic exercise;Patient/family education;Balance training;Functional mobility training;Stair training    PT Goals (Current goals can be found in the Care Plan section)  Acute Rehab PT Goals Patient Stated Goal: return to independent PT Goal Formulation: With patient/family Time For Goal Achievement: 12/09/23 Potential to Achieve Goals: Good    Frequency Min 1X/week     Co-evaluation               AM-PAC PT "6 Clicks" Mobility  Outcome Measure Help needed turning from your back to your side while in a flat bed without using  bedrails?: A Little Help needed moving from lying on your back to sitting on the side of a flat bed without using bedrails?: A Little Help needed moving to and from a bed to a chair (including a wheelchair)?: A Little Help needed standing up from a chair using your arms (e.g., wheelchair or bedside chair)?: A Little Help needed to walk in hospital room?: Total Help needed climbing 3-5 steps with a railing? : Total 6 Click Score: 14    End of Session Equipment Utilized During Treatment: Oxygen Activity Tolerance: Patient limited by fatigue Patient left: in chair;with chair alarm set;with call bell/phone within reach;with family/visitor present   PT Visit Diagnosis: Other abnormalities of gait and mobility (R26.89);Difficulty in walking, not elsewhere classified (R26.2);Pain Pain - part of body:  (abdomen)    Time: 1610-9604 PT Time Calculation (min) (ACUTE ONLY): 24 min   Charges:   PT Evaluation $PT Eval Moderate Complexity: 1 Mod PT Treatments $Therapeutic Activity: 8-22 mins PT General Charges $$ ACUTE PT VISIT: 1 Visit         Sheran Lawless, PT Acute Rehabilitation Services Office:[phone] 11/25/2023   [name] 11/25/2023, 1:48 PM

## 2023-11-25 NOTE — TOC CM/SW Note (Signed)
Transition of Care  [facility]) - Inpatient Brief Assessment   Patient Details  Name: Christine Morse MRN: [id number] Date of Birth: 23-May-1936  Transition of Care [facility]) CM/SW Contact:    Mearl Latin, LCSW Phone Number: 11/25/2023, 10:17 AM   Clinical Narrative: Patient admitted from home s/p Whipple procedure. No current TOC needs identified but please place consult if therapies are needed post discharge.    Transition of Care Asessment: Insurance and Status: Insurance coverage has been reviewed Patient has primary care physician: Yes Home environment has been reviewed: From home Prior level of function:: Independent Prior/Current Home Services: No current home services Social Determinants of Health Reivew: SDOH reviewed no interventions necessary Readmission risk has been reviewed: Yes Transition of care needs: no transition of care needs at this time

## 2023-11-25 NOTE — Progress Notes (Signed)
2 Days Post-Op   Subjective/Chief Complaint: Some delirium overnight.  Denies pain.  No n/v.     Objective: Vital signs in last 24 hours: Temp:  [98.1 F (36.7 C)-99.4 F (37.4 C)] 98.1 F (36.7 C) (12/05 0800) Pulse Rate:  [72-103] 92 (12/05 1000) Resp:  [7-32] 28 (12/05 1000) BP: (122-161)/(51-80) 150/74 (12/05 1000) SpO2:  [90 %-97 %] 92 % (12/05 1000) Last BM Date :  (PTA)  Intake/Output from previous day: 12/04 0701 - 12/05 0700 In: 1640.9 [I.V.:1150; IV Piggyback:490.9] Out: 1375 [Urine:715; Emesis/NG output:50; Drains:610] Intake/Output this shift: Total I/O In: 136.5 [I.V.:100; IV Piggyback:36.5] Out: 100 [Urine:60; Drains:40]  General appearance: alert, cooperative, in soft wrist restraints, but seems like MS is clearing.  Resp: breathing comfortably GI: soft, non distended, approp tender. Drains bloody, onQ in place Extremities: extremities normal, atraumatic, no cyanosis or edema  Lab Results:  Recent Labs    11/24/23 0512 11/25/23 0510  WBC 9.7 15.0*  HGB 10.3* 9.1*  HCT 31.8* 29.2*  PLT 287 206   BMET Recent Labs    11/24/23 0405 11/25/23 0510  NA 137 137  K 3.7 3.7  CL 110 106  CO2 22 22  GLUCOSE 153* 118*  BUN 12 10  CREATININE 0.54 0.43*  CALCIUM 7.5* 8.3*   PT/INR Recent Labs    11/24/23 0405 11/25/23 0510  LABPROT 17.4* 16.1*  INR 1.4* 1.3*   ABG No results for input(s): "PHART", "HCO3" in the last 72 hours.  Invalid input(s): "PCO2", "PO2"  Studies/Results: No results found.  Anti-infectives: Anti-infectives (From admission, onward)    Start     Dose/Rate Route Frequency Ordered Stop   11/23/23 2100  ceFAZolin (ANCEF) IVPB 2g/100 mL premix        2 g 200 mL/hr over 30 Minutes Intravenous Every 8 hours 11/23/23 1654 11/23/23 2214   11/23/23 0700  ceFAZolin (ANCEF) IVPB 2g/100 mL premix        2 g 200 mL/hr over 30 Minutes Intravenous On call to O.R. 11/23/23 0654 11/23/23 1249       Assessment/Plan: s/p  Procedure(s) with comments: LAPAROSCOPY DIAGNOSTIC (N/A) - 8 HOURS ROOM 10 WHIPPLE PROCEDURE (N/A) Do not remove foley - marginal uop D/c ngt, sips of clears.   HOCM - appreciate cards input.  Metoprolol iv until oral beta blocker can be started. No oral meds today, can prob start oral meds tomorrow.  Anemia of chronic disease, chronic blood loss anemia, and acute blood loss anemia - stable.  Hyperglycemia - check fingersticks. 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   Standing 25% albumin x 48 hours given patient fragile volume status Drains high output, but had quite a bit of irrigation.  Drain amylase pending.  Pain control with OnQ pain pump, standing tylenol, prn robaxin, prn fentanyl. Plan to replace onQ tomorrow.  OOB, IS, PT consult.  Start ppx lovenox today.    Will also get SW consult for placement.    LOS: 2 days    Almond Lint 11/25/2023

## 2023-11-26 ENCOUNTER — Other Ambulatory Visit: Payer: Self-pay

## 2023-11-26 DIAGNOSIS — I421 Obstructive hypertrophic cardiomyopathy: Secondary | ICD-10-CM | POA: Diagnosis not present

## 2023-11-26 LAB — GLUCOSE, CAPILLARY
Glucose-Capillary: 112 mg/dL — ABNORMAL HIGH (ref 70–99)
Glucose-Capillary: 126 mg/dL — ABNORMAL HIGH (ref 70–99)
Glucose-Capillary: 103 mg/dL — ABNORMAL HIGH (ref 70–99)
Glucose-Capillary: 123 mg/dL — ABNORMAL HIGH (ref 70–99)

## 2023-11-26 LAB — CBC
HCT: 31.1 % — ABNORMAL LOW (ref 36.0–46.0)
Hemoglobin: 9.9 g/dL — ABNORMAL LOW (ref 12.0–15.0)
MCH: 30.3 pg (ref 26.0–34.0)
MCHC: 31.8 g/dL (ref 30.0–36.0)
MCV: 95.1 fL (ref 80.0–100.0)
Platelets: 355 10*3/uL (ref 150–400)
RBC: 3.27 MIL/uL — ABNORMAL LOW (ref 3.87–5.11)
RDW: 15.9 % — ABNORMAL HIGH (ref 11.5–15.5)
WBC: 19.3 10*3/uL — ABNORMAL HIGH (ref 4.0–10.5)
nRBC: 0 % (ref 0.0–0.2)

## 2023-11-26 LAB — POCT I-STAT 7, (LYTES, BLD GAS, ICA,H+H)
Acid-base deficit: 2 mmol/L (ref 0.0–2.0)
Acid-base deficit: 5 mmol/L — ABNORMAL HIGH (ref 0.0–2.0)
Acid-base deficit: 6 mmol/L — ABNORMAL HIGH (ref 0.0–2.0)
Bicarbonate: 23.8 mmol/L (ref 20.0–28.0)
Bicarbonate: 20.9 mmol/L (ref 20.0–28.0)
Bicarbonate: 20.8 mmol/L (ref 20.0–28.0)
Calcium, Ion: 1.18 mmol/L (ref 1.15–1.40)
Calcium, Ion: 1.13 mmol/L — ABNORMAL LOW (ref 1.15–1.40)
Calcium, Ion: 1.1 mmol/L — ABNORMAL LOW (ref 1.15–1.40)
HCT: 29 % — ABNORMAL LOW (ref 36.0–46.0)
HCT: 31 % — ABNORMAL LOW (ref 36.0–46.0)
HCT: 35 % — ABNORMAL LOW (ref 36.0–46.0)
Hemoglobin: 9.9 g/dL — ABNORMAL LOW (ref 12.0–15.0)
Hemoglobin: 10.5 g/dL — ABNORMAL LOW (ref 12.0–15.0)
Hemoglobin: 11.9 g/dL — ABNORMAL LOW (ref 12.0–15.0)
O2 Saturation: 99 %
O2 Saturation: 99 %
O2 Saturation: 99 %
Patient temperature: 35.4
Patient temperature: 35.7
Patient temperature: 36.3
Potassium: 3.8 mmol/L (ref 3.5–5.1)
Potassium: 3.8 mmol/L (ref 3.5–5.1)
Potassium: 4 mmol/L (ref 3.5–5.1)
Sodium: 141 mmol/L (ref 135–145)
Sodium: 142 mmol/L (ref 135–145)
Sodium: 140 mmol/L (ref 135–145)
TCO2: 25 mmol/L (ref 22–32)
TCO2: 22 mmol/L (ref 22–32)
TCO2: 22 mmol/L (ref 22–32)
pCO2 arterial: 42.1 mm[Hg] (ref 32–48)
pCO2 arterial: 37.5 mm[Hg] (ref 32–48)
pCO2 arterial: 45.8 mm[Hg] (ref 32–48)
pH, Arterial: 7.353 (ref 7.35–7.45)
pH, Arterial: 7.349 — ABNORMAL LOW (ref 7.35–7.45)
pH, Arterial: 7.261 — ABNORMAL LOW (ref 7.35–7.45)
pO2, Arterial: 152 mm[Hg] — ABNORMAL HIGH (ref 83–108)
pO2, Arterial: 132 mm[Hg] — ABNORMAL HIGH (ref 83–108)
pO2, Arterial: 153 mm[Hg] — ABNORMAL HIGH (ref 83–108)

## 2023-11-26 LAB — COMPREHENSIVE METABOLIC PANEL
ALT: 19 U/L (ref 0–44)
AST: 22 U/L (ref 15–41)
Albumin: 3.6 g/dL (ref 3.5–5.0)
Alkaline Phosphatase: 45 U/L (ref 38–126)
Anion gap: 7 (ref 5–15)
BUN: 6 mg/dL — ABNORMAL LOW (ref 8–23)
CO2: 23 mmol/L (ref 22–32)
Calcium: 8.4 mg/dL — ABNORMAL LOW (ref 8.9–10.3)
Chloride: 105 mmol/L (ref 98–111)
Creatinine, Ser: 0.44 mg/dL (ref 0.44–1.00)
GFR, Estimated: >60 mL/min (ref >60)
Glucose, Bld: 128 mg/dL — ABNORMAL HIGH (ref 70–99)
Potassium: 3.7 mmol/L (ref 3.5–5.1)
Sodium: 135 mmol/L (ref 135–145)
Total Bilirubin: 1.5 mg/dL — ABNORMAL HIGH (ref <1.2)
Total Protein: 5.1 g/dL — ABNORMAL LOW (ref 6.5–8.1)

## 2023-11-26 LAB — PHOSPHORUS: Phosphorus: 2.1 mg/dL — ABNORMAL LOW (ref 2.5–4.6)

## 2023-11-26 LAB — MAGNESIUM: Magnesium: 2 mg/dL (ref 1.7–2.4)

## 2023-11-26 MED ORDER — ENOXAPARIN SODIUM 40 MG/0.4ML IJ SOSY
40.0000 mg | PREFILLED_SYRINGE | INTRAMUSCULAR | Status: DC
  Administered 2023-11-26 – 2023-12-02 (×7): 40 mg via SUBCUTANEOUS
  Filled 2023-11-26 (×7): qty 0.4

## 2023-11-26 MED ORDER — HYALURONIDASE HUMAN 150 UNIT/ML IJ SOLN
150.0000 [IU] | INTRAMUSCULAR | Status: AC
  Administered 2023-11-26: 150 [IU] via SUBCUTANEOUS
  Filled 2023-11-26: qty 1

## 2023-11-26 MED ORDER — ACETAMINOPHEN 325 MG PO TABS
650.0000 mg | ORAL_TABLET | Freq: Four times a day (QID) | ORAL | Status: DC | PRN
  Administered 2023-12-01 – 2023-12-21 (×2): 650 mg via ORAL
  Filled 2023-11-26 (×3): qty 2

## 2023-11-26 MED ORDER — KCL IN DEXTROSE-NACL 20-5-0.45 MEQ/L-%-% IV SOLN
INTRAVENOUS | Status: AC
  Filled 2023-11-26: qty 1000

## 2023-11-26 MED ORDER — POTASSIUM PHOSPHATES 15 MMOLE/5ML IV SOLN
15.0000 mmol | Freq: Once | INTRAVENOUS | Status: AC
  Administered 2023-11-26: 15 mmol via INTRAVENOUS
  Filled 2023-11-26: qty 5

## 2023-11-26 MED ORDER — BUPIVACAINE 0.25 % ON-Q PUMP DUAL CATH 400 ML
400.0000 mL | INJECTION | Status: AC
  Filled 2023-11-26 (×2): qty 400

## 2023-11-26 MED ORDER — ALBUMIN HUMAN 25 % IV SOLN
25.0000 g | Freq: Four times a day (QID) | INTRAVENOUS | Status: AC
  Administered 2023-11-26 – 2023-11-28 (×8): 25 g via INTRAVENOUS
  Filled 2023-11-26 (×8): qty 100

## 2023-11-26 MED ORDER — PIPERACILLIN-TAZOBACTAM 3.375 G IVPB
3.3750 g | Freq: Three times a day (TID) | INTRAVENOUS | Status: DC
  Administered 2023-11-26 – 2023-12-13 (×52): 3.375 g via INTRAVENOUS
  Filled 2023-11-26 (×52): qty 50

## 2023-11-26 NOTE — Progress Notes (Signed)
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    PT Comments  Patient progressing to hallway ambulation and able to return to room with RW.  She was initially lethargic, but interacted with cues and improved posture at times.  Grandaughter in the room and supportive.  Continue to feel she should progress to be able to return home with family assist and follow up HHPT.     If plan is discharge home, recommend the following: A little help with walking and/or transfers;Assistance with cooking/housework;Assist for transportation;Help with stairs or ramp for entrance;A little help with bathing/dressing/bathroom   Can travel by private vehicle        Equipment Recommendations  Rolling walker (2 wheels);BSC/3in1    Recommendations for Other Services       Precautions / Restrictions Precautions Precautions: Fall Precaution Comments: bilat Blake drains and On-Q pump     Mobility  Bed Mobility Overal bed mobility: Needs Assistance Bed Mobility: Rolling, Sidelying to Sit Rolling: Mod assist Sidelying to sit: Mod assist, Used rails       General bed mobility comments: lifting help for trunk    Transfers Overall transfer level: Needs assistance Equipment used: Rolling walker (2 wheels) Transfers: Sit to/from Stand Sit to Stand: Min assist           General transfer comment: assist up from EOB; mild posterior bias stepping back to recliner to sit with A for balance    Ambulation/Gait Ambulation/Gait assistance: Min assist, +2 safety/equipment Gait Distance (Feet): 90 Feet Assistive device: Rolling walker (2 wheels) Gait Pattern/deviations: Step-through  pattern, Decreased stride length, Trunk flexed, Shuffle       General Gait Details: cues for forward gaze, upright posture, RN pusing IV pole, assist for walker management on turns   Stairs             Wheelchair Mobility     Tilt Bed    Modified Rankin (Stroke Patients Only)       Balance Overall balance assessment: Needs assistance Sitting-balance support: Feet supported Sitting balance-[name]: Fair     Standing balance support: Bilateral upper extremity supported Standing balance-[name]: Poor Standing balance comment: UE support for balance                            Cognition Arousal: Alert Behavior During Therapy: Flat affect Overall Cognitive Status: Within Functional Limits for tasks assessed                                 General Comments: slow to respond and needing cue to interact with environment, but oriented        Exercises      General Comments General comments (skin integrity, edema, etc.): on 4L O2 with ambulation VSS, RN present, grandaughter in room post therapy      Pertinent Vitals/Pain Pain Assessment Pain Assessment: Faces Faces Pain Scale: Hurts little more Pain Location: operative site with mobility, denies rest pain Pain Descriptors / Indicators: Discomfort, Grimacing, Operative site guarding Pain Intervention(s): Monitored during session, Repositioned    Home Living  Prior Function            PT Goals (current goals can now be found in the care plan section) Progress towards PT goals: Progressing toward goals    Frequency    Min 1X/week      PT Plan      Co-evaluation              AM-PAC PT "6 Clicks" Mobility   Outcome Measure  Help needed turning from your back to your side while in a flat bed without using bedrails?: A Little Help needed moving from lying on your back to sitting on the side of a flat bed without using  bedrails?: A Little Help needed moving to and from a bed to a chair (including a wheelchair)?: A Little Help needed standing up from a chair using your arms (e.g., wheelchair or bedside chair)?: A Little Help needed to walk in hospital room?: A Little Help needed climbing 3-5 steps with a railing? : Total 6 Click Score: 16    End of Session Equipment Utilized During Treatment: Oxygen Activity Tolerance: Patient limited by fatigue Patient left: in chair;with call bell/phone within reach;with family/visitor present;with chair alarm set Nurse Communication: Mobility status PT Visit Diagnosis: Other abnormalities of gait and mobility (R26.89);Difficulty in walking, not elsewhere classified (R26.2);Pain Pain - part of body:  (abdomen)     Time: 0981-1914 PT Time Calculation (min) (ACUTE ONLY): 26 min  Charges:    $Gait Training: 8-22 mins $Therapeutic Activity: 8-22 mins PT General Charges $$ ACUTE PT VISIT: 1 Visit                     Christine Morse, PT Acute Rehabilitation Services Office:[phone] 11/26/2023    Christine Morse 11/26/2023, 1:38 PM

## 2023-11-26 NOTE — Evaluation (Signed)
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   Clinical Impression   Patient admitted for the diagnosis above.  PTA she lives at home with her great granddaughter, who attends school and works a part time job.  Other family members that live close to her are elderly, and cannot physically assist her.  Family member in room, states patient needs to be Mod I to return home, as she will not have adequate assist.  Currently she is needing Min A for basic mobility, and up to Mod A for lower body ADL from a sit to stand level.  OT will continue efforts in the acute setting to address deficits, and Patient will benefit from continued inpatient follow up therapy, <3 hours/day       If plan is discharge home, recommend the following: A lot of help with bathing/dressing/bathroom;A little help with walking and/or transfers;Assist for transportation;Assistance with cooking/housework;Help with stairs or ramp for entrance    Functional Status Assessment  Patient has had a recent decline in their functional status and demonstrates the ability to make significant improvements in function in a reasonable and predictable amount of time.  Equipment Recommendations  BSC/3in1    Recommendations for Other Services       Precautions / Restrictions Precautions Precautions: Fall Precaution Comments: bilat Blake drains and On-Q pump Restrictions Weight Bearing Restrictions: No      Mobility Bed Mobility Overal bed mobility: Needs Assistance Bed Mobility: Sit to Supine       Sit to supine: Min assist, Mod assist        Transfers Overall transfer level: Needs  assistance Equipment used: Rolling walker (2 wheels) Transfers: Sit to/from Stand, Bed to chair/wheelchair/BSC Sit to Stand: Min assist     Step pivot transfers: Min assist            Balance Overall balance assessment: Needs assistance Sitting-balance support: Feet supported Sitting balance-[name]: Fair     Standing balance support: Bilateral upper extremity supported Standing balance-[name]: Poor                             ADL either performed or assessed with clinical judgement   ADL Overall ADL's : Needs assistance/impaired Eating/Feeding: Set up;Sitting   Grooming: Wash/dry hands;Wash/dry face;Oral care;Contact guard assist;Standing;Minimal assistance   Upper Body Bathing: Minimal assistance;Sitting   Lower Body Bathing: Moderate assistance;Sit to/from stand   Upper Body Dressing : Moderate assistance;Sitting   Lower Body Dressing: Moderate assistance;Sit to/from stand   Toilet Transfer: Minimal assistance;Regular Toilet;Rolling walker (2 wheels)                   Vision Baseline Vision/History: 1 Wears glasses Patient Visual Report: No change from baseline       Perception Perception: Within Functional Limits       Praxis Praxis: WFL       Pertinent Vitals/Pain Pain Assessment Pain Assessment: Faces Faces Pain Scale: Hurts little more Pain Location: operative site with mobility Pain Descriptors / Indicators: Discomfort, Grimacing, Operative site guarding Pain Intervention(s): Monitored during session     Extremity/Trunk Assessment Upper Extremity Assessment Upper Extremity Assessment: Generalized weakness  Lower Extremity Assessment Lower Extremity Assessment: Generalized weakness   Cervical / Trunk Assessment Cervical / Trunk Assessment: Other exceptions Cervical / Trunk Exceptions: abdominal surgery   Communication Communication Communication: No apparent difficulties Cueing Techniques: Visual cues    Cognition Arousal: Alert Behavior During Therapy: Flat affect Overall Cognitive Status: Impaired/Different from baseline Area of Impairment: Memory, Safety/judgement, Problem solving                     Memory: Decreased short-term memory   Safety/Judgement: Decreased awareness of safety, Decreased awareness of deficits   Problem Solving: Slow processing, Requires verbal cues General Comments: Family member in room endorses increased confusion and decreased memory compared to baseline     General Comments  VSS on supplemental O2    Exercises     Shoulder Instructions      Home Living Family/patient expects to be discharged to:: Private residence Living Arrangements: Other relatives Available Help at Discharge: Family;Available PRN/intermittently Type of Home: House Home Access: Stairs to enter [employer] of Steps: 6 Entrance Stairs-Rails: Right;Left Home Layout: One level     Bathroom Shower/Tub: Chief Strategy Officer: Standard     Home Equipment: Shower seat   Additional Comments: Randie Heinz granddaughter that lives with her goes to school and works PT.  Other relatives that live close, are too old to provide physical assist.      Prior Functioning/Environment Prior Level of Function : Independent/Modified Independent;Driving             Mobility Comments: pt reports ind, no AD ADLs Comments: pt reports ind with ADLs/IADLs, drives locally, children provide transportation for appointments out of town        OT Problem List: Decreased strength;Decreased activity tolerance;Impaired balance (sitting and/or standing);Decreased safety awareness;Pain;Increased edema      OT Treatment/Interventions: Self-care/ADL training;Therapeutic activities;Patient/family education;Balance training;DME and/or AE instruction    OT Goals(Current goals can be found in the care plan section) Acute Rehab OT Goals Patient Stated Goal: Return home when  ready OT Goal Formulation: With patient Time For Goal Achievement: 12/10/23 Potential to Achieve Goals: Fair ADL Goals Pt Will Perform Grooming: with supervision;standing Pt Will Perform Upper Body Bathing: with set-up;sitting Pt Will Perform Lower Body Bathing: sit to/from stand;with contact guard assist Pt Will Perform Lower Body Dressing: with contact guard assist;sit to/from stand Pt Will Transfer to Toilet: with supervision;ambulating;regular height toilet  OT Frequency: Min 1X/week    Co-evaluation              AM-PAC OT "6 Clicks" Daily Activity     Outcome Measure Help from another person eating meals?: A Little Help from another person taking care of personal grooming?: A Little Help from another person toileting, which includes using toliet, bedpan, or urinal?: A Lot Help from another person bathing (including washing, rinsing, drying)?: A Lot Help from another person to put on and taking off regular upper body clothing?: A Little Help from another person to put on and taking off regular lower body clothing?: A Lot 6 Click Score: 15   End of Session Equipment Utilized During Treatment: Rolling walker (2 wheels) Nurse Communication: Mobility status  Activity Tolerance: Patient tolerated treatment well Patient left: in bed;with call bell/phone within reach;with nursing/sitter in room;with family/visitor present  OT Visit Diagnosis: Unsteadiness on feet (R26.81);Muscle weakness (generalized) (M62.81)                Time: 4098-1191 OT Time Calculation (min): 22 min Charges:  OT General Charges $OT Visit: 1 Visit OT Evaluation $OT Eval Moderate Complexity: 1 Mod  11/26/2023  RP, OTR/L  Acute Rehabilitation Services  Office:  [phone]   Suzanna Obey 11/26/2023, 2:58 PM

## 2023-11-26 NOTE — Progress Notes (Signed)
1030: Noticed swelling and redness on left hand IV site. Patient has iv potassium phosphate being administered through this iv. Medication stopped and switched to a different site. IV taken out and pharmacy consulted for infiltration. 11AM: Orders placed for Hylenex 150 units to be administered at site. 1145: Medication administer and head pack applied. Explained process to patient and granddaugher that was in the room.  Hand continues to look better, swelling and redness has decreased.

## 2023-11-26 NOTE — Progress Notes (Signed)
   Cardiologist:  Hochrein  Subjective:  No cardiac symptoms some abdominal pain    Objective:  Vitals:   11/26/23 0700 11/26/23 0800 11/26/23 0900 11/26/23 1000  BP: (!) 142/62 (!) 152/64 (!) 160/72 (!) 157/70  Pulse: 86 84 88 92  Resp: (!) 25 (!) 29 (!) 29 (!) 26  Temp:  (!) 97.5 F (36.4 C)    TempSrc:  Axillary    SpO2: 91% 92% 92% 92%  Weight:      Height:        Intake/Output from previous day:  Intake/Output Summary (Last 24 hours) at 11/26/2023 1016 Last data filed at 11/26/2023 0500 Gross per 24 hour  Intake 742.51 ml  Output 1030 ml  Net -287.49 ml    Physical Exam:  Elderly female NG tube SEM Lungs clear Two JP drains in abdomen  Trace edema  Lab Results: Basic Metabolic Panel: Recent Labs    11/25/23 0510 11/26/23 0522  NA 137 135  K 3.7 3.7  CL 106 105  CO2 22 23  GLUCOSE 118* 128*  BUN 10 6*  CREATININE 0.43* 0.44  CALCIUM 8.3* 8.4*  MG 2.0 2.0  PHOS 1.3* 2.1*   Liver Function Tests: Recent Labs    11/25/23 0510 11/26/23 0522  AST 29 22  ALT 24 19  ALKPHOS 35* 45  BILITOT 1.7* 1.5*  PROT 5.0* 5.1*  ALBUMIN 3.9 3.6   No results for input(s): "LIPASE", "AMYLASE" in the last 72 hours. CBC: Recent Labs    11/25/23 0510 11/26/23 0522  WBC 15.0* 19.3*  HGB 9.1* 9.9*  HCT 29.2* 31.1*  MCV 95.4 95.1  PLT 206 355     Imaging: No results found.  Cardiac Studies:  ECG: SR voltage for LVH poor R wave progression   Telemetry: NSR rates in 80's   Echo: 10/24/23 HOCM peak LVOT velocity rest 3.3 m/sec severe LAE moderate MR   Medications:    Chlorhexidine Gluconate Cloth  6 each Topical Q0600   enoxaparin (LOVENOX) injection  40 mg Subcutaneous Q24H   insulin aspart  0-6 Units Subcutaneous TID WC   metoprolol tartrate  5 mg Intravenous Q4H   pantoprazole (PROTONIX) IV  40 mg Intravenous QHS      albumin human 25 g (11/26/23 0904)   bupivacaine 0.25 % ON-Q pump DUAL CATH 400 mL     bupivacaine ON-Q pain pump Stopped  (11/23/23 2010)   dextrose 5 % and 0.45 % NaCl with KCl 20 mEq/L     piperacillin-tazobactam (ZOSYN)  IV 3.375 g (11/26/23 0932)   potassium PHOSPHATE IVPB (in mmol) 15 mmol (11/26/23 0803)    Assessment/Plan:  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  HLD:  can hold statin  Postoperative:  ? Will need PPN pancreatic leak Antibiotic coverage with Zosyn. Hct stable 31.1 Hb 9.9 transfuse for Hb < 9   Charlton Haws 11/26/2023, 10:16 AM

## 2023-11-26 NOTE — Progress Notes (Signed)
Pharmacy Antibiotic and Enoxaparin Note  Christine Morse is a 87 y.o. female admitted on 11/23/2023 with  pancreatic leak .  Pharmacy has been consulted for zosyn, enoxaparin dosing.  Plan: Zosyn 3.375g IV q 8h  -f/u renal function, cultures PRN   Enox 40 daily for DVT prophylaxis  Height: 4\' 10"  (147.3 cm) Weight: 55.3 kg (122 lb) IBW/kg (Calculated) : 40.9  Temp (24hrs), Avg:99 F (37.2 C), Min:97.5 F (36.4 C), Max:100.1 F (37.8 C)  Recent Labs  Lab 11/24/23 0405 11/24/23 0512 11/25/23 0510 11/26/23 0522  WBC  --  9.7 15.0* 19.3*  CREATININE 0.54  --  0.43* 0.44    Estimated Creatinine Clearance: 38.6 mL/min (by C-G formula based on SCr of 0.44 mg/dL).    Allergies  Allergen Reactions   Codeine     Antimicrobials this admission: Zosyn 12/6>  Dose adjustments this admission:   Microbiology results: 12/3 MRSA neg  Thank you for allowing pharmacy to be a part of this patient's care.  Calton Dach, PharmD, BCCCP Clinical Pharmacist 11/26/2023 12:12 PM

## 2023-11-26 NOTE — Progress Notes (Signed)
3 Days Post-Op   Subjective/Chief Complaint: Had 2 x emesis last night.     Objective: Vital signs in last 24 hours: Temp:  [97.5 F (36.4 C)-100.1 F (37.8 C)] 97.5 F (36.4 C) (12/06 0800) Pulse Rate:  [75-99] 84 (12/06 0800) Resp:  [20-37] 29 (12/06 0800) BP: (140-166)/(53-112) 152/64 (12/06 0800) SpO2:  [90 %-95 %] 92 % (12/06 0800) Last BM Date :  (PTA)  Intake/Output from previous day: 12/05 0701 - 12/06 0700 In: 879 [I.V.:263.1; IV Piggyback:615.9] Out: 1130 [Urine:550; Drains:580] Intake/Output this shift: No intake/output data recorded.  General appearance: alert, cooperative, out of restraints Resp: breathing comfortably GI: soft, non distended, approp tender. Drains serosang, onQ in place, empty.  Extremities: extremities normal, atraumatic, no cyanosis or edema  Lab Results:  Recent Labs    11/25/23 0510 11/26/23 0522  WBC 15.0* 19.3*  HGB 9.1* 9.9*  HCT 29.2* 31.1*  PLT 206 355   BMET Recent Labs    11/25/23 0510 11/26/23 0522  NA 137 135  K 3.7 3.7  CL 106 105  CO2 22 23  GLUCOSE 118* 128*  BUN 10 6*  CREATININE 0.43* 0.44  CALCIUM 8.3* 8.4*   PT/INR Recent Labs    11/24/23 0405 11/25/23 0510  LABPROT 17.4* 16.1*  INR 1.4* 1.3*   ABG Recent Labs    11/23/23 1254 11/23/23 1403  PHART 7.349* 7.261*  HCO3 20.9 20.8    Studies/Results: No results found.  Anti-infectives: Anti-infectives (From admission, onward)    Start     Dose/Rate Route Frequency Ordered Stop   11/23/23 2100  ceFAZolin (ANCEF) IVPB 2g/100 mL premix        2 g 200 mL/hr over 30 Minutes Intravenous Every 8 hours 11/23/23 1654 11/23/23 2214   11/23/23 0700  ceFAZolin (ANCEF) IVPB 2g/100 mL premix        2 g 200 mL/hr over 30 Minutes Intravenous On call to O.R. 11/23/23 0654 11/23/23 1249       Assessment/Plan: s/p Procedure(s) with comments: LAPAROSCOPY DIAGNOSTIC (N/A) - 8 HOURS ROOM 10 WHIPPLE PROCEDURE (N/A) Do not remove foley - marginal uop    HOCM - appreciate cards input.  Metoprolol iv until oral beta blocker can be started. Oral meds ok to start today, but might not absorb.   Anemia of chronic disease, chronic blood loss anemia, and acute blood loss anemia - stable.  Hyperglycemia - check fingersticks. 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   Standing 25% albumin x 48 hours given patient fragile volume status Appears to have pancreatic leak, not unexpected given very soft nature of pancreas.  Start antibiotics.  Pain control with OnQ pain pump, standing tylenol, prn robaxin, prn fentanyl. Plan to replace onQ today.  OOB, IS, PT consult.  Start ppx lovenox today.    SW consult.    LOS: 3 days    Almond Lint 11/26/2023

## 2023-11-27 ENCOUNTER — Inpatient Hospital Stay (HOSPITAL_COMMUNITY): Payer: Medicare Other

## 2023-11-27 DIAGNOSIS — R0600 Dyspnea, unspecified: Secondary | ICD-10-CM | POA: Diagnosis not present

## 2023-11-27 DIAGNOSIS — D132 Benign neoplasm of duodenum: Secondary | ICD-10-CM

## 2023-11-27 DIAGNOSIS — R110 Nausea: Secondary | ICD-10-CM | POA: Diagnosis not present

## 2023-11-27 DIAGNOSIS — R918 Other nonspecific abnormal finding of lung field: Secondary | ICD-10-CM | POA: Diagnosis not present

## 2023-11-27 DIAGNOSIS — I700 Atherosclerosis of aorta: Secondary | ICD-10-CM | POA: Diagnosis not present

## 2023-11-27 LAB — CBC
HCT: 30.2 % — ABNORMAL LOW (ref 36.0–46.0)
Hemoglobin: 9.6 g/dL — ABNORMAL LOW (ref 12.0–15.0)
MCH: 30.5 pg (ref 26.0–34.0)
MCHC: 31.8 g/dL (ref 30.0–36.0)
MCV: 95.9 fL (ref 80.0–100.0)
Platelets: 408 10*3/uL — ABNORMAL HIGH (ref 150–400)
RBC: 3.15 MIL/uL — ABNORMAL LOW (ref 3.87–5.11)
RDW: 15.3 % (ref 11.5–15.5)
WBC: 19.5 10*3/uL — ABNORMAL HIGH (ref 4.0–10.5)
nRBC: 0 % (ref 0.0–0.2)

## 2023-11-27 LAB — TYPE AND SCREEN
ABO/RH(D): O POS
Antibody Screen: NEGATIVE
Unit division: 0
Unit division: 0
Unit division: 0
Unit division: 0

## 2023-11-27 LAB — COMPREHENSIVE METABOLIC PANEL
ALT: 17 U/L (ref 0–44)
AST: 22 U/L (ref 15–41)
Albumin: 4 g/dL (ref 3.5–5.0)
Alkaline Phosphatase: 39 U/L (ref 38–126)
Anion gap: 11 (ref 5–15)
BUN: 12 mg/dL (ref 8–23)
CO2: 21 mmol/L — ABNORMAL LOW (ref 22–32)
Calcium: 8.7 mg/dL — ABNORMAL LOW (ref 8.9–10.3)
Chloride: 105 mmol/L (ref 98–111)
Creatinine, Ser: 0.56 mg/dL (ref 0.44–1.00)
GFR, Estimated: >60 mL/min (ref >60)
Glucose, Bld: 115 mg/dL — ABNORMAL HIGH (ref 70–99)
Potassium: 3.4 mmol/L — ABNORMAL LOW (ref 3.5–5.1)
Sodium: 137 mmol/L (ref 135–145)
Total Bilirubin: 2 mg/dL — ABNORMAL HIGH (ref <1.2)
Total Protein: 5.6 g/dL — ABNORMAL LOW (ref 6.5–8.1)

## 2023-11-27 LAB — BPAM RBC
Blood Product Expiration Date: 202412182359
Blood Product Expiration Date: 202412182359
Blood Product Expiration Date: 202412182359
Blood Product Expiration Date: 202412182359
ISSUE DATE / TIME: 202412030853
ISSUE DATE / TIME: 202412030853
ISSUE DATE / TIME: 202412030853
ISSUE DATE / TIME: 202412030853
Unit Type and Rh: 5100
Unit Type and Rh: 5100
Unit Type and Rh: 5100
Unit Type and Rh: 5100

## 2023-11-27 LAB — GLUCOSE, CAPILLARY
Glucose-Capillary: 113 mg/dL — ABNORMAL HIGH (ref 70–99)
Glucose-Capillary: 113 mg/dL — ABNORMAL HIGH (ref 70–99)
Glucose-Capillary: 120 mg/dL — ABNORMAL HIGH (ref 70–99)
Glucose-Capillary: 127 mg/dL — ABNORMAL HIGH (ref 70–99)
Glucose-Capillary: 145 mg/dL — ABNORMAL HIGH (ref 70–99)

## 2023-11-27 LAB — MAGNESIUM: Magnesium: 2 mg/dL (ref 1.7–2.4)

## 2023-11-27 LAB — PHOSPHORUS: Phosphorus: 2.7 mg/dL (ref 2.5–4.6)

## 2023-11-27 MED ORDER — PANTOPRAZOLE SODIUM 40 MG IV SOLR
40.0000 mg | Freq: Two times a day (BID) | INTRAVENOUS | Status: DC
  Administered 2023-11-27 – 2024-01-13 (×96): 40 mg via INTRAVENOUS
  Filled 2023-11-27 (×96): qty 10

## 2023-11-27 MED ORDER — POTASSIUM CHLORIDE 10 MEQ/100ML IV SOLN
10.0000 meq | INTRAVENOUS | Status: AC
  Administered 2023-11-27 (×5): 10 meq via INTRAVENOUS
  Filled 2023-11-27 (×5): qty 100

## 2023-11-27 NOTE — Progress Notes (Signed)
4 Days Post-Op   Subjective/Chief Complaint: Continues to experience emesis. Drains SS.  WBC and Hb Stable.    Objective: Vital signs in last 24 hours: Temp:  [97.8 F (36.6 C)-100.6 F (38.1 C)] 97.9 F (36.6 C) (12/07 0350) Pulse Rate:  [74-104] 91 (12/07 0700) Resp:  [20-35] 25 (12/07 0700) BP: (137-186)/(56-99) 139/63 (12/07 0700) SpO2:  [91 %-96 %] 95 % (12/07 0700) Last BM Date :  (PTA)  Intake/Output from previous day: 12/06 0701 - 12/07 0700 In: 1007.1 [P.O.:100; I.V.:286.5; IV Piggyback:620.6] Out: 1135 [Urine:910; Drains:225] Intake/Output this shift: No intake/output data recorded.  General appearance: alert, cooperative, out of restraints Resp: breathing comfortably GI: soft, non distended, approp tender. Drains serosang, onQ in place Extremities: extremities normal, atraumatic, no cyanosis or edema  Lab Results:  Recent Labs    11/26/23 0522 11/27/23 0347  WBC 19.3* 19.5*  HGB 9.9* 9.6*  HCT 31.1* 30.2*  PLT 355 408*   BMET Recent Labs    11/26/23 0522 11/27/23 0347  NA 135 137  K 3.7 3.4*  CL 105 105  CO2 23 21*  GLUCOSE 128* 115*  BUN 6* 12  CREATININE 0.44 0.56  CALCIUM 8.4* 8.7*   PT/INR Recent Labs    11/25/23 0510  LABPROT 16.1*  INR 1.3*   ABG No results for input(s): "PHART", "HCO3" in the last 72 hours.  Invalid input(s): "PCO2", "PO2"   Studies/Results: No results found.  Anti-infectives: Anti-infectives (From admission, onward)    Start     Dose/Rate Route Frequency Ordered Stop   11/26/23 0945  piperacillin-tazobactam (ZOSYN) IVPB 3.375 g        3.375 g 12.5 mL/hr over 240 Minutes Intravenous Every 8 hours 11/26/23 0853     11/23/23 2100  ceFAZolin (ANCEF) IVPB 2g/100 mL premix        2 g 200 mL/hr over 30 Minutes Intravenous Every 8 hours 11/23/23 1654 11/23/23 2214   11/23/23 0700  ceFAZolin (ANCEF) IVPB 2g/100 mL premix        2 g 200 mL/hr over 30 Minutes Intravenous On call to O.R. 11/23/23 0654 11/23/23  1249       Assessment/Plan: s/p Procedure(s) with comments: LAPAROSCOPY DIAGNOSTIC (N/A) - 8 HOURS ROOM 10 WHIPPLE PROCEDURE (N/A) Do not remove foley - marginal uop   HOCM - appreciate cards input, IV lopressor x 48 hrs then transition to PO (12/8) Anemia of chronic disease, chronic blood loss anemia, and acute blood loss anemia - stable.  Hyperglycemia - check fingersticks. Hypophosphatemia - replete Hypokalemia - replete Emesis - KUB today to assess for ileus, continue CLD for now  Final pathology + adenocarcinoma w/ +LN - shared with family by Dr. Donell Beers  Standing 25% albumin x 48 hours given patient fragile volume status Appears to have pancreatic leak, not unexpected given very soft nature of pancreas.  Continue Zosyn.  WBC stable Pain control with OnQ pain pump, standing tylenol, prn robaxin, prn fentanyl. onQ replaced 12/6 OOB, IS, PT consult.  Continue lovenox.   SW consult.    LOS: 4 days    Moise Boring 11/27/2023

## 2023-11-28 ENCOUNTER — Inpatient Hospital Stay (HOSPITAL_COMMUNITY): Payer: Medicare Other

## 2023-11-28 DIAGNOSIS — R0600 Dyspnea, unspecified: Secondary | ICD-10-CM | POA: Diagnosis not present

## 2023-11-28 DIAGNOSIS — I421 Obstructive hypertrophic cardiomyopathy: Secondary | ICD-10-CM | POA: Diagnosis not present

## 2023-11-28 DIAGNOSIS — I700 Atherosclerosis of aorta: Secondary | ICD-10-CM | POA: Diagnosis not present

## 2023-11-28 DIAGNOSIS — R110 Nausea: Secondary | ICD-10-CM | POA: Diagnosis not present

## 2023-11-28 DIAGNOSIS — I10 Essential (primary) hypertension: Secondary | ICD-10-CM

## 2023-11-28 DIAGNOSIS — R918 Other nonspecific abnormal finding of lung field: Secondary | ICD-10-CM | POA: Diagnosis not present

## 2023-11-28 LAB — COMPREHENSIVE METABOLIC PANEL
ALT: 14 U/L (ref 0–44)
AST: 12 U/L — ABNORMAL LOW (ref 15–41)
Albumin: 4.5 g/dL (ref 3.5–5.0)
Alkaline Phosphatase: 33 U/L — ABNORMAL LOW (ref 38–126)
Anion gap: 11 (ref 5–15)
BUN: 16 mg/dL (ref 8–23)
CO2: 23 mmol/L (ref 22–32)
Calcium: 9.1 mg/dL (ref 8.9–10.3)
Chloride: 105 mmol/L (ref 98–111)
Creatinine, Ser: 0.55 mg/dL (ref 0.44–1.00)
GFR, Estimated: >60 mL/min (ref >60)
Glucose, Bld: 120 mg/dL — ABNORMAL HIGH (ref 70–99)
Potassium: 3.5 mmol/L (ref 3.5–5.1)
Sodium: 139 mmol/L (ref 135–145)
Total Bilirubin: 1.8 mg/dL — ABNORMAL HIGH (ref <1.2)
Total Protein: 5.9 g/dL — ABNORMAL LOW (ref 6.5–8.1)

## 2023-11-28 LAB — CBC
HCT: 27 % — ABNORMAL LOW (ref 36.0–46.0)
Hemoglobin: 8.4 g/dL — ABNORMAL LOW (ref 12.0–15.0)
MCH: 30.3 pg (ref 26.0–34.0)
MCHC: 31.1 g/dL (ref 30.0–36.0)
MCV: 97.5 fL (ref 80.0–100.0)
Platelets: 378 10*3/uL (ref 150–400)
RBC: 2.77 MIL/uL — ABNORMAL LOW (ref 3.87–5.11)
RDW: 15.1 % (ref 11.5–15.5)
WBC: 15.3 10*3/uL — ABNORMAL HIGH (ref 4.0–10.5)
nRBC: 0 % (ref 0.0–0.2)

## 2023-11-28 LAB — GLUCOSE, CAPILLARY
Glucose-Capillary: 114 mg/dL — ABNORMAL HIGH (ref 70–99)
Glucose-Capillary: 118 mg/dL — ABNORMAL HIGH (ref 70–99)
Glucose-Capillary: 150 mg/dL — ABNORMAL HIGH (ref 70–99)
Glucose-Capillary: 120 mg/dL — ABNORMAL HIGH (ref 70–99)

## 2023-11-28 LAB — MAGNESIUM: Magnesium: 2.1 mg/dL (ref 1.7–2.4)

## 2023-11-28 LAB — PHOSPHORUS: Phosphorus: 2.1 mg/dL — ABNORMAL LOW (ref 2.5–4.6)

## 2023-11-28 MED ORDER — POTASSIUM PHOSPHATES 15 MMOLE/5ML IV SOLN
30.0000 mmol | Freq: Once | INTRAVENOUS | Status: AC
  Administered 2023-11-28: 30 mmol via INTRAVENOUS
  Filled 2023-11-28: qty 10

## 2023-11-28 MED ORDER — SODIUM PHOSPHATES 45 MMOLE/15ML IV SOLN: 30.0000 mmol | Freq: Once | INTRAVENOUS | Status: DC

## 2023-11-28 MED ORDER — FUROSEMIDE 10 MG/ML IJ SOLN
20.0000 mg | Freq: Once | INTRAMUSCULAR | Status: AC
  Administered 2023-11-28: 20 mg via INTRAVENOUS
  Filled 2023-11-28: qty 2

## 2023-11-28 MED ORDER — POTASSIUM CHLORIDE 10 MEQ/100ML IV SOLN: 10.0000 meq | INTRAVENOUS | Status: DC

## 2023-11-28 MED ORDER — IPRATROPIUM-ALBUTEROL 0.5-2.5 (3) MG/3ML IN SOLN
RESPIRATORY_TRACT | Status: AC
  Administered 2023-11-28: 3 mL
  Filled 2023-11-28: qty 3

## 2023-11-28 MED ORDER — HYDRALAZINE HCL 20 MG/ML IJ SOLN: 5.0000 mg | Freq: Four times a day (QID) | INTRAMUSCULAR | Status: DC | PRN

## 2023-11-28 MED ORDER — LABETALOL HCL 5 MG/ML IV SOLN: 5.0000 mg | INTRAVENOUS | Status: DC | PRN

## 2023-11-28 MED ORDER — IPRATROPIUM-ALBUTEROL 0.5-2.5 (3) MG/3ML IN SOLN: 3.0000 mL | Freq: Four times a day (QID) | RESPIRATORY_TRACT | Status: DC | PRN

## 2023-11-28 MED ORDER — LABETALOL HCL 5 MG/ML IV SOLN
20.0000 mg | INTRAVENOUS | Status: DC
  Administered 2023-11-28 – 2023-12-07 (×54): 20 mg via INTRAVENOUS
  Filled 2023-11-28 (×54): qty 4

## 2023-11-28 MED ORDER — VERAPAMIL HCL 40 MG PO TABS
40.0000 mg | ORAL_TABLET | Freq: Three times a day (TID) | ORAL | Status: DC
  Administered 2023-11-28: 40 mg via ORAL
  Filled 2023-11-28: qty 1

## 2023-11-28 NOTE — Progress Notes (Signed)
5 Days Post-Op   Subjective/Chief Complaint: AXR did not show significant dilation but she did continue to experience emesis.  UOP marginal but Cr stable.  Endorsing generalized discomfort but no specific complaints. She appears more SOB today.     Objective: Vital signs in last 24 hours: Temp:  [97.5 F (36.4 C)-100.2 F (37.9 C)] 98.7 F (37.1 C) (12/08 0300) Pulse Rate:  [67-93] 82 (12/08 0729) Resp:  [19-30] 25 (12/08 0729) BP: (132-188)/(57-87) 173/67 (12/08 0729) SpO2:  [89 %-100 %] 94 % (12/08 0729) Last BM Date :  (PTA)  Intake/Output from previous day: 12/07 0701 - 12/08 0700 In: 1080.8 [I.V.:94.9; IV Piggyback:985.9] Out: 670 [Urine:565; Drains:105] Intake/Output this shift: No intake/output data recorded.  General appearance: alert, cooperative, out of restraints Resp: breathing somewhat labored, on [place] GI: soft, non distended, approp tender. Drains serosang, onQ in place Extremities: extremities normal, atraumatic, no cyanosis or edema  Lab Results:  Recent Labs    11/27/23 0347 11/28/23 0509  WBC 19.5* 15.3*  HGB 9.6* 8.4*  HCT 30.2* 27.0*  PLT 408* 378   BMET Recent Labs    11/27/23 0347 11/28/23 0509  NA 137 139  K 3.4* 3.5  CL 105 105  CO2 21* 23  GLUCOSE 115* 120*  BUN 12 16  CREATININE 0.56 0.55  CALCIUM 8.7* 9.1   PT/INR No results for input(s): "LABPROT", "INR" in the last 72 hours.  ABG No results for input(s): "PHART", "HCO3" in the last 72 hours.  Invalid input(s): "PCO2", "PO2"   Studies/Results: DG Abd Portable 1V  Result Date: 11/27/2023 CLINICAL DATA:  Nausea EXAM: PORTABLE ABDOMEN - 1 VIEW COMPARISON:  None Available. FINDINGS: The bowel gas pattern is normal. No radio-opaque calculi or other significant radiographic abnormality are seen. Midline skin staples noted. Catheter overlies the midline lower abdomen. IMPRESSION: Unremarkable bowel gas pattern. Electronically Signed   By: Layla Maw M.D.   On: 11/27/2023 10:09     Anti-infectives: Anti-infectives (From admission, onward)    Start     Dose/Rate Route Frequency Ordered Stop   11/26/23 0945  piperacillin-tazobactam (ZOSYN) IVPB 3.375 g        3.375 g 12.5 mL/hr over 240 Minutes Intravenous Every 8 hours 11/26/23 0853     11/23/23 2100  ceFAZolin (ANCEF) IVPB 2g/100 mL premix        2 g 200 mL/hr over 30 Minutes Intravenous Every 8 hours 11/23/23 1654 11/23/23 2214   11/23/23 0700  ceFAZolin (ANCEF) IVPB 2g/100 mL premix        2 g 200 mL/hr over 30 Minutes Intravenous On call to O.R. 11/23/23 0654 11/23/23 1249       Assessment/Plan: s/p Procedure(s) with comments: LAPAROSCOPY DIAGNOSTIC (N/A) - 8 HOURS ROOM 10 WHIPPLE PROCEDURE (N/A)   HOCM - appreciate cards input, IV lopressor x 48 hrs then transition to PO (12/8), Lasix x 1 12/8.  Monitor response.  Keep foley for accurate I/Os.   Anemia of chronic disease, chronic blood loss anemia, and acute blood loss anemia - stable.  Hyperglycemia - check fingersticks. Hypophosphatemia - replete Hypokalemia - replete Emesis - KUB 12/7 unremarkable.  Continue CLD as tolerated, MIVF given poor PO intake  Pancreatic leak - Continue Zosyn.  WBC stable Pain control with OnQ pain pump, standing tylenol, prn robaxin, prn fentanyl. onQ replaced 12/6 OOB, IS, PT consult.  Continue lovenox.   Final pathology + adenocarcinoma w/ +LN - shared with family by Dr. Donell Beers  SW consult.  LOS: 5 days    Christine Morse 11/28/2023

## 2023-11-28 NOTE — Progress Notes (Signed)
   Notified by pharmacy that patient was started on PO verapamil this AM for BP control. However, patient has high aspiration risk and is currently only drinking about 1 sip of water per hour.   Transition from IV metoprolol to IV labetalol for improved BP control. Stop verapamil PO for now. Start IV hydralazine PRN for elevated BP.   Jonita Albee, PA-C 11/28/2023 11:36 AM

## 2023-11-28 NOTE — Progress Notes (Signed)
Paged Dr. Hillery Hunter regarding BP's out of range. New orders to follow. Dr. Hillery Hunter at bedside to assess. Care ongoing.

## 2023-11-28 NOTE — Progress Notes (Addendum)
Per Dr. Hillery Hunter, ok to give patient as much clear liquids as she is able to tolerate. Patient able to tolerate about 10% of her clear liquid tray, and has been able to drink water and juice without any nausea or vomiting.

## 2023-11-28 NOTE — Progress Notes (Signed)
Cardiologist:  Hochrein  Subjective:  No cardiac symptoms some abdominal pain  Struggling some with liquid diet Seems more uncomfortable and dyspnic this am    Objective:  Vitals:   11/28/23 0700 11/28/23 0729 11/28/23 0800 11/28/23 0900  BP: (!) 188/84 (!) 173/67 (!) 185/82 (!) 167/58  Pulse: 87 82 83 83  Resp: (!) 28 (!) 25 (!) 24 (!) 27  Temp:   98.1 F (36.7 C)   TempSrc:   Oral   SpO2: 97% 94% 96% 95%  Weight:      Height:        Intake/Output from previous day:  Intake/Output Summary (Last 24 hours) at 11/28/2023 0930 Last data filed at 11/28/2023 0800 Gross per 24 hour  Intake 985.89 ml  Output 780 ml  Net 205.89 ml    Physical Exam:  Elderly female NG tube SEM Lungs clear JP drain in abdomen mild distension Trace edema  Lab Results: Basic Metabolic Panel: Recent Labs    11/27/23 0347 11/28/23 0509  NA 137 139  K 3.4* 3.5  CL 105 105  CO2 21* 23  GLUCOSE 115* 120*  BUN 12 16  CREATININE 0.56 0.55  CALCIUM 8.7* 9.1  MG 2.0 2.1  PHOS 2.7 2.1*   Liver Function Tests: Recent Labs    11/27/23 0347 11/28/23 0509  AST 22 12*  ALT 17 14  ALKPHOS 39 33*  BILITOT 2.0* 1.8*  PROT 5.6* 5.9*  ALBUMIN 4.0 4.5   No results for input(s): "LIPASE", "AMYLASE" in the last 72 hours. CBC: Recent Labs    11/27/23 0347 11/28/23 0509  WBC 19.5* 15.3*  HGB 9.6* 8.4*  HCT 30.2* 27.0*  MCV 95.9 97.5  PLT 408* 378     Imaging: DG Abd Portable 1V  Result Date: 11/27/2023 CLINICAL DATA:  Nausea EXAM: PORTABLE ABDOMEN - 1 VIEW COMPARISON:  None Available. FINDINGS: The bowel gas pattern is normal. No radio-opaque calculi or other significant radiographic abnormality are seen. Midline skin staples noted. Catheter overlies the midline lower abdomen. IMPRESSION: Unremarkable bowel gas pattern. Electronically Signed   By: Layla Maw M.D.   On: 11/27/2023 10:09    Cardiac Studies:  ECG: SR voltage for LVH poor R wave progression   Telemetry: NSR  rates in 80's   Echo: 10/24/23 HOCM peak LVOT velocity rest 3.3 m/sec severe LAE moderate MR   Medications:    Chlorhexidine Gluconate Cloth  6 each Topical Q0600   enoxaparin (LOVENOX) injection  40 mg Subcutaneous Q24H   metoprolol tartrate  5 mg Intravenous Q4H   pantoprazole (PROTONIX) IV  40 mg Intravenous Q12H      bupivacaine 0.25 % ON-Q pump DUAL CATH 400 mL     piperacillin-tazobactam (ZOSYN)  IV 12.5 mL/hr at 11/28/23 0600    Assessment/Plan:  HOCM:  stable she is NPO Surgery indicates possible oral intake but poor absorption likely so continue She is struggling with liquid diet iv lopressor for 48 hours then transition to lopressor 25 mg bid I/O's slightly positive and BP up will give a dose of lasix today as she is wheezy Check CXR. Add verapamil today if she can take it orally.  HLD:  can hold statin  Postoperative:  ? Will need PPN pancreatic leak Antibiotic coverage with Zosyn. Hct drifting down 27 consider transfusion. KUB for vomiting/nausea r/o ileus  HTN:  see above iv lopressor, adding verapamil and a dose of lasix today   Charlton Haws 11/28/2023, 9:30 AM

## 2023-11-29 DIAGNOSIS — D132 Benign neoplasm of duodenum: Secondary | ICD-10-CM | POA: Diagnosis not present

## 2023-11-29 LAB — CBC
HCT: 34 % — ABNORMAL LOW (ref 36.0–46.0)
Hemoglobin: 10.8 g/dL — ABNORMAL LOW (ref 12.0–15.0)
MCH: 30.5 pg (ref 26.0–34.0)
MCHC: 31.8 g/dL (ref 30.0–36.0)
MCV: 96 fL (ref 80.0–100.0)
Platelets: 552 10*3/uL — ABNORMAL HIGH (ref 150–400)
RBC: 3.54 MIL/uL — ABNORMAL LOW (ref 3.87–5.11)
RDW: 14.9 % (ref 11.5–15.5)
WBC: 20.4 10*3/uL — ABNORMAL HIGH (ref 4.0–10.5)
nRBC: 0 % (ref 0.0–0.2)

## 2023-11-29 LAB — COMPREHENSIVE METABOLIC PANEL
ALT: 12 U/L (ref 0–44)
AST: 11 U/L — ABNORMAL LOW (ref 15–41)
Albumin: 3.4 g/dL — ABNORMAL LOW (ref 3.5–5.0)
Alkaline Phosphatase: 40 U/L (ref 38–126)
Anion gap: 14 (ref 5–15)
BUN: 23 mg/dL (ref 8–23)
CO2: 22 mmol/L (ref 22–32)
Calcium: 8.8 mg/dL — ABNORMAL LOW (ref 8.9–10.3)
Chloride: 105 mmol/L (ref 98–111)
Creatinine, Ser: 0.69 mg/dL (ref 0.44–1.00)
GFR, Estimated: >60 mL/min (ref >60)
Glucose, Bld: 138 mg/dL — ABNORMAL HIGH (ref 70–99)
Potassium: 3.3 mmol/L — ABNORMAL LOW (ref 3.5–5.1)
Sodium: 141 mmol/L (ref 135–145)
Total Bilirubin: 1.7 mg/dL — ABNORMAL HIGH (ref <1.2)
Total Protein: 5.2 g/dL — ABNORMAL LOW (ref 6.5–8.1)

## 2023-11-29 LAB — GLUCOSE, CAPILLARY
Glucose-Capillary: 131 mg/dL — ABNORMAL HIGH (ref 70–99)
Glucose-Capillary: 142 mg/dL — ABNORMAL HIGH (ref 70–99)
Glucose-Capillary: 134 mg/dL — ABNORMAL HIGH (ref 70–99)
Glucose-Capillary: 148 mg/dL — ABNORMAL HIGH (ref 70–99)
Glucose-Capillary: 160 mg/dL — ABNORMAL HIGH (ref 70–99)

## 2023-11-29 LAB — SURGICAL PATHOLOGY

## 2023-11-29 MED ORDER — BUPIVACAINE 0.25 % ON-Q PUMP DUAL CATH 400 ML
400.0000 mL | INJECTION | Status: AC
  Filled 2023-11-29: qty 400

## 2023-11-29 MED ORDER — FUROSEMIDE 10 MG/ML IJ SOLN
20.0000 mg | Freq: Once | INTRAMUSCULAR | Status: AC
  Administered 2023-11-29: 20 mg via INTRAVENOUS
  Filled 2023-11-29: qty 2

## 2023-11-29 MED ORDER — FENTANYL CITRATE PF 50 MCG/ML IJ SOSY
50.0000 ug | PREFILLED_SYRINGE | Freq: Once | INTRAMUSCULAR | Status: AC
  Administered 2023-11-29: 50 ug via INTRAVENOUS
  Filled 2023-11-29: qty 1

## 2023-11-29 NOTE — NC FL2 (Signed)
MEDICAID FL2 LEVEL OF CARE FORM     IDENTIFICATION  Patient Name: Christine Morse Birthdate: Jan 07, 1936 Sex: female Admission Date (Current Location): 11/23/2023  [facility] and [place] Number:   ([email or web address])   Facility and Address:  [facility], 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   Upper GI bleed 11/08/2023   Hyponatremia 11/07/2023   Duodenal cancer (HCC) 10/26/2023   Positive fecal occult blood test 10/26/2023   Duodenal mass 10/25/2023   Symptomatic anemia 10/24/2023   Nonrheumatic aortic valve stenosis 07/30/2022   Abnormal stress test 07/30/2022   Pyelonephritis 09/19/2017   Essential hypertension 09/19/2017   Hyperlipidemia 09/19/2017   Pulmonary vascular congestion 09/19/2017   Cameron ulcer 10/05/2013   Iron deficiency anemia due to chronic blood loss 10/04/2013   Other specified gastritis without mention of hemorrhage 10/04/2013   Chest tightness 10/03/2013   Vitamin D deficiency 04/08/2009   GERD 03/26/2009   Diverticulosis of colon 03/26/2009   DIARRHEA 03/26/2009   NEPHROLITHIASIS, HX OF 03/26/2009    Orientation RESPIRATION BLADDER Height & Weight     Self, Time, Situation, Place  O2 Incontinent, Indwelling catheter Weight: 122 lb (55.3 kg) Height:  4\' 10"  (147.3 cm)  BEHAVIORAL SYMPTOMS/MOOD NEUROLOGICAL BOWEL NUTRITION STATUS      Continent Diet (See dc summary)  AMBULATORY STATUS COMMUNICATION OF NEEDS Skin   Limited Assist Verbally Surgical wounds (Closed incision on abdomen)                        Personal Care Assistance Level of Assistance  Bathing, Feeding, Dressing Bathing Assistance: Limited assistance Feeding assistance: Limited assistance Dressing Assistance: Limited assistance     Functional Limitations Info  Sight Sight Info: Impaired        SPECIAL CARE FACTORS FREQUENCY  PT (By licensed PT), OT (By licensed OT)     PT Frequency: 5x/week OT Frequency: 5x/week            Contractures Contractures Info: Not present    Additional Factors Info  Code Status, Allergies Code Status Info: Full Allergies Info: Codeine           Current Medications (11/29/2023):  This is the current hospital active medication list Current Facility-Administered Medications  Medication Dose Route Frequency Provider Last Rate Last Admin   acetaminophen (TYLENOL) tablet 650 mg  650 mg Oral Q6H PRN Almond Lint, MD       bupivacaine 0.25 % ON-Q pump DUAL CATH 400 mL  400 mL Other Continuous Almond Lint, MD       Chlorhexidine Gluconate Cloth 2 % PADS 6 each  6 each Topical Q0600 Almond Lint, MD   6 each at 11/29/23 0913   enoxaparin (LOVENOX) injection 40 mg  40 mg Subcutaneous Q24H Almond Lint, MD   40 mg at 11/29/23 0913   fentaNYL (SUBLIMAZE) injection 25 mcg  25 mcg Intravenous Q1H PRN Almond Lint, MD   25 mcg at 11/29/23 0336   haloperidol lactate (HALDOL) injection 2 mg  2 mg Intravenous Q8H PRN Almond Lint, MD   2 mg at 11/26/23 2241   hydrALAZINE (APRESOLINE) injection 5 mg  5 mg Intravenous Q6H PRN Wendall Stade, MD       ipratropium-albuterol (DUONEB) 0.5-2.5 (3) MG/3ML nebulizer solution 3 mL  3 mL Nebulization Q6H PRN Almond Lint, MD       labetalol (NORMODYNE) injection 20 mg  20 mg Intravenous Q4H Wendall Stade, MD   20 mg at 11/29/23 1523   labetalol (NORMODYNE) injection 5 mg  5 mg Intravenous Q2H PRN Wendall Stade, MD       methocarbamol (ROBAXIN) injection 500 mg  500 mg Intravenous Q8H PRN Almond Lint, MD   500 mg at 11/27/23  2121   ondansetron (ZOFRAN) injection 4 mg  4 mg Intravenous Q6H PRN Almond Lint, MD   4 mg at 11/29/23 0438   Oral care mouth rinse  15 mL Mouth Rinse PRN Almond Lint, MD       pantoprazole (PROTONIX) injection 40 mg  40 mg Intravenous Q12H Moise Boring, MD   40 mg at 11/29/23 0913   piperacillin-tazobactam (ZOSYN) IVPB 3.375 g  3.375 g Intravenous Q8H Calton Dach I, RPH 12.5 mL/hr at 11/29/23 1256 3.375 g at 11/29/23 1256   prochlorperazine (COMPAZINE) tablet 10 mg  10 mg Oral Q6H PRN Almond Lint, MD       Or   prochlorperazine (COMPAZINE) injection 5-10 mg  5-10 mg Intravenous Q6H PRN Almond Lint, MD   5 mg at 11/26/23 2007     Discharge Medications: Please see discharge summary for a list of discharge medications.  Relevant Imaging Results:  Relevant Lab Results:   Additional Information SSn; [phone] [place], LCSW

## 2023-11-29 NOTE — Progress Notes (Signed)
Per order to stop pump infusion, this RN clamped the pump however it still attached to the pt, will follow MD directions prior to removal from the Pt

## 2023-11-29 NOTE — Progress Notes (Signed)
6 Days Post-Op   Subjective/Chief Complaint: Less nausea overnight, but minimal intake.  Getting confused about where she is, but knows she had surgery and is oriented to person.     Objective: Vital signs in last 24 hours: Temp:  [97.6 F (36.4 C)-99.9 F (37.7 C)] 98.6 F (37 C) (12/09 1200) Pulse Rate:  [70-100] 84 (12/09 1200) Resp:  [18-31] 28 (12/09 1200) BP: (111-163)/(51-77) 111/58 (12/09 1200) SpO2:  [93 %-98 %] 95 % (12/09 1200) Last BM Date : 11/26/23  Intake/Output from previous day: 12/08 0701 - 12/09 0700 In: 1037 [P.O.:250; IV Piggyback:787] Out: 1805 [Urine:1770; Drains:35] Intake/Output this shift: Total I/O In: 26.7 [IV Piggyback:26.7] Out: 250 [Urine:250]  General appearance: alert, cooperative Resp: breathing comfortably on [place].  GI: soft, non distended, approp tender. Drains serosang, onQ in place, still around 1 day on OnQ.  Clamps tight.  Extremities: extremities normal, atraumatic, no cyanosis or edema  Lab Results:  Recent Labs    11/28/23 0509 11/29/23 0926  WBC 15.3* 20.4*  HGB 8.4* 10.8*  HCT 27.0* 34.0*  PLT 378 552*   BMET Recent Labs    11/28/23 0509 11/29/23 0926  NA 139 141  K 3.5 3.3*  CL 105 105  CO2 23 22  GLUCOSE 120* 138*  BUN 16 23  CREATININE 0.55 0.69  CALCIUM 9.1 8.8*   PT/INR No results for input(s): "LABPROT", "INR" in the last 72 hours.  ABG No results for input(s): "PHART", "HCO3" in the last 72 hours.  Invalid input(s): "PCO2", "PO2"   Studies/Results: DG CHEST PORT 1 VIEW  Result Date: 11/28/2023 CLINICAL DATA:  Dyspnea EXAM: PORTABLE CHEST 1 VIEW COMPARISON:  11/07/2023. FINDINGS: Grossly enlarged cardiac silhouette. Bilateral pleural effusions larger on the right there are layering. Interstitial and alveolar opacities consistent with diffuse bilateral pneumonia versus more likely pulmonary edema. Calcified aorta. No pneumothorax. IMPRESSION: Findings suggest worsening CHF. Diffuse bilateral pneumonia  cannot be excluded. Electronically Signed   By: Layla Maw M.D.   On: 11/28/2023 11:06    Anti-infectives: Anti-infectives (From admission, onward)    Start     Dose/Rate Route Frequency Ordered Stop   11/26/23 0945  piperacillin-tazobactam (ZOSYN) IVPB 3.375 g        3.375 g 12.5 mL/hr over 240 Minutes Intravenous Every 8 hours 11/26/23 0853     11/23/23 2100  ceFAZolin (ANCEF) IVPB 2g/100 mL premix        2 g 200 mL/hr over 30 Minutes Intravenous Every 8 hours 11/23/23 1654 11/23/23 2214   11/23/23 0700  ceFAZolin (ANCEF) IVPB 2g/100 mL premix        2 g 200 mL/hr over 30 Minutes Intravenous On call to O.R. 11/23/23 0654 11/23/23 1249       Assessment/Plan: s/p Procedure(s) with comments: LAPAROSCOPY DIAGNOSTIC (N/A) - 8 HOURS ROOM 10 WHIPPLE PROCEDURE (N/A)   HOCM - appreciate cards input, diuresing.  Anemia of chronic disease, chronic blood loss anemia, and acute blood loss anemia - stable.  Hyperglycemia - check fingersticks. Hypophosphatemia - replete Hypokalemia - replete Emesis - improved.  Advance diet and calorie counts.    Pancreatic leak - Continue Zosyn.  WBC stable Pain control with OnQ pain pump, standing tylenol, prn robaxin, prn fentanyl. onQ replaced 12/6. Still has 1 day.  OOB, IS, PT consult.  Continue lovenox.   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   SW consult.    LOS: 6 days    Darrick Huntsman  Ronnett Pullin 11/29/2023

## 2023-11-29 NOTE — Progress Notes (Signed)
   Patient Name: Christine Morse Date of Encounter: 11/29/2023 [facility] Cardiologist: Rollene Rotunda, MD   Interval Summary  .    Duodenal adenocarcinoma.  Hypertrophic cardiomyopathy.  IV beta-blocker due to unable to tolerate p.o.  Vital Signs .    Vitals:   11/29/23 0600 11/29/23 0700 11/29/23 0800 11/29/23 0900  BP: 132/62 134/63 138/62 (!) 131/59  Pulse: 89 91 84 84  Resp: (!) 31 (!) 31 (!) 31 (!) 29  Temp:   99.9 F (37.7 C)   TempSrc:   Oral   SpO2: 94% 93% 96% 95%  Weight:      Height:        Intake/Output Summary (Last 24 hours) at 11/29/2023 1024 Last data filed at 11/29/2023 0900 Gross per 24 hour  Intake 1061.98 ml  Output 1465 ml  Net -403.02 ml      11/23/2023    7:01 AM 11/15/2023   10:51 AM 11/11/2023    2:55 PM  Last 3 Weights  Weight (lbs) 122 lb 122 lb 1.6 oz 123 lb 3.2 oz  Weight (kg) 55.339 kg 55.384 kg 55.883 kg      Telemetry/ECG    Normal sinus rhythm- Personally Reviewed  Physical Exam .   GEN: No acute distress.   Neck: No JVD Cardiac: RRR, 2/6 SM, no rubs, or gallops.  Respiratory: Clear to auscultation bilaterally. GI: drain MS: No edema  Assessment & Plan .     87 year old female with hypertrophic obstructive cardiomyopathy with peak LVOT velocity at rest 3.3 m/s with severe left atrial enlargement and moderate mitral regurgitation  Hypertrophic obstructive cardiomyopathy - Currently on labetalol IV as she is not able to tolerate p.o. medications.  Unable to tolerate p.o. medications, will transition to p.o. when able.  Hyperlipidemia - Continue to hold p.o. statin  Hypertension - IV hydralazine was added.  Continue with IV labetalol as well.  Pancreatic leak and-Zosyn, final pathology positive adenocarcinoma with positive lymph nodes shared with family by Dr. Donell Beers  For questions or updates, please contact  [facility] Please consult [email or web address] for contact info under        Signed, Donato Schultz, MD

## 2023-11-29 NOTE — Progress Notes (Signed)
Physical Therapy Treatment Patient Details Name: Christine Morse MRN: [id number] DOB: 1936/01/07 Today's Date: 11/29/2023   History of Present Illness Patient is an 87 y/o female admitted 11/23/23 with diagnosis of duodenal adenoma and underwent laparoscopic Whipple procedure.  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    PT Comments  Pt is progressing steadily toward goals, but a ways to go.  Patient will benefit from continued inpatient follow up therapy, <3 hours/day.  Emphasis today on warm up, transition to EOB including scooting.  Safety with sit to stand and progression of gait stability and stamina with the RW at this point and min to mod assist overall.      If plan is discharge home, recommend the following: A little help with walking and/or transfers;Assistance with cooking/housework;Assist for transportation;Help with stairs or ramp for entrance;A little help with bathing/dressing/bathroom   Can travel by private vehicle     Yes  Equipment Recommendations  Rolling walker (2 wheels);BSC/3in1    Recommendations for Other Services       Precautions / Restrictions Precautions Precautions: Fall Precaution Comments: bilat Blake drains and On-Q pump Restrictions Weight Bearing Restrictions: No     Mobility  Bed Mobility Overal bed mobility: Needs Assistance Bed Mobility: Supine to Sit Rolling: Mod assist Sidelying to sit: Mod assist       General bed mobility comments: assist through trunk and UE's    Transfers Overall transfer level: Needs assistance Equipment used: Rolling walker (2 wheels) Transfers: Sit to/from Stand Sit to Stand: Min assist           General transfer comment: cues for technique and assist both forward and boost    Ambulation/Gait Ambulation/Gait assistance: Min assist Gait Distance (Feet): 150 Feet Assistive device: Rolling walker (2 wheels) Gait Pattern/deviations:  Step-through pattern, Decreased stride length, Trunk flexed   Gait velocity interpretation: <1.8 ft/sec, indicate of risk for recurrent falls   General Gait Details: Short, slower, lower amplitude steps, but without shuffle, flexed posture, safe use of the RW with minimal stepping out of the RW.  Spo2  with gait dropping to 87% and >95 on4L [place].  HR in the 90's   Stairs             Wheelchair Mobility     Tilt Bed    Modified Rankin (Stroke Patients Only)       Balance Overall balance assessment: Needs assistance Sitting-balance support: Feet supported Sitting balance-[name]: Fair       Standing balance-[name]: Poor Standing balance comment: UE support for balance                            Cognition Arousal: Alert Behavior During Therapy: Flat affect, WFL for tasks assessed/performed Overall Cognitive Status:  (NT formally)                                          Exercises      General Comments        Pertinent Vitals/Pain Pain Assessment Pain Assessment: Faces Faces Pain Scale: No hurt Pain Intervention(s):  (pain pump on)    Home Living                          Prior Function  PT Goals (current goals can now be found in the care plan section) Acute Rehab PT Goals Patient Stated Goal: return to independent PT Goal Formulation: With patient/family Time For Goal Achievement: 12/09/23 Potential to Achieve Goals: Good Progress towards PT goals: Progressing toward goals    Frequency    Min 1X/week      PT Plan      Co-evaluation              AM-PAC PT "6 Clicks" Mobility   Outcome Measure  Help needed turning from your back to your side while in a flat bed without using bedrails?: A Little Help needed moving from lying on your back to sitting on the side of a flat bed without using bedrails?: A Little Help needed moving to and from a bed to a chair (including a  wheelchair)?: A Little Help needed standing up from a chair using your arms (e.g., wheelchair or bedside chair)?: A Little Help needed to walk in hospital room?: A Little Help needed climbing 3-5 steps with a railing? : A Lot 6 Click Score: 17    End of Session Equipment Utilized During Treatment: Oxygen Activity Tolerance: Patient limited by fatigue;Patient tolerated treatment well Patient left: in chair;with call bell/phone within reach;with family/visitor present;with chair alarm set Nurse Communication: Mobility status PT Visit Diagnosis: Other abnormalities of gait and mobility (R26.89);Difficulty in walking, not elsewhere classified (R26.2);Pain     Time: 9563-8756 PT Time Calculation (min) (ACUTE ONLY): 37 min  Charges:    $Gait Training: 8-22 mins $Therapeutic Activity: 8-22 mins PT General Charges $$ ACUTE PT VISIT: 1 Visit                     11/29/2023  Jacinto Halim., PT Acute Rehabilitation Services [phone]  (office)   [name] 11/29/2023, 1:01 PM

## 2023-11-29 NOTE — TOC Initial Note (Signed)
Transition of Care [facility]) - Initial/Assessment Note    Patient Details  Name: Christine Morse MRN: [id number] Date of Birth: 1935-12-27  Transition of Care [facility]) CM/SW Contact:    Mearl Latin, LCSW Phone Number: 11/29/2023, 4:04 PM  Clinical Narrative:                 CSW received consult for possible SNF placement at time of discharge. CSW spoke with patient's daughter due to patient's intermittent confusion. Patient's daughter reported that they have not thought about discharge plan yet but asked about CIR (daughter lives in [place] bu she has heard that the hospital in [place] has Rehab on the 3rd floor). CSW reached out to CIR to ask about candidacy. Patient's daughter expressed understanding of PT recommendation and is agreeable to CSW sending referral to SNFs as well while they discuss plan. CSW discussed insurance authorization process and will provide Medicare SNF ratings list.   Skilled Nursing Rehab Facilities-   [email or web address]   Ratings out of 5 stars (5 the highest)   Name Address  Phone # Quality Care Staffing Health Inspection Overall  [facility] [address] [phone] 2 2 5 5   [facility] [address] [place] [phone] 4 2 4 4   [facility] [address], [place] [phone] 2 1 2 1   [facility] [address], [place] [phone] 3 1 4 3   [facility]  [address], [place] [phone] 4 4 5 5   [facility] [address] [facility] [phone] 3 2 2 2   [facility] [address] [place] [phone] 5 1 2 2   [facility] [id number] [address] [place] [phone] 1 1 3 1   [address] ([place]) [address] [place] [phone] 2 2 2 2   [facility] [address] [place], [place] [phone] 2 2 1 1   [facility] [address], [place] [phone] 3 1 1 1   [facility]igha Bridegroom [address] Liliane Shi [phone] 3 3 4 4   [facility] [address] [place] [phone] 2 2 3 3           [facility] [address], [place] [phone] 4 2 1 1   [facility], [place] [address], [place] [phone] 1 1 2 1   [facility] [address] [place] [phone] 2 1 4 3   [facility] [address]itto [phone] 2 1 4 3   [facility] [address], [place] [phone] 2 3 3 3           [address] (no [facility]) [address], [place] [phone] 4 5 5 5   [facility] (No [employer]) [address] Laver[place] Guise [phone] 1 2 4 3   [facility] [address], [place] [phone] 2 1 2 1   [facility] (No [employer]) [address], [place] [phone] 4 1 5 4   [facility] [address], [facility] [phone] 3 4 2 2   [facility] [address], [place] [phone] 2 1 1 1   [facility] [address] Liliane Shi [phone] 4 2 5 5   [facility]  [address], [place] [phone] 2 2 3 3   [facility] [address], [place] [phone] 4 1 1 1   [facility] [address] [place], [place] [phone] 2 2 3 3           [facility] [address] [place] [phone] 2 1 1  1  [facility] [address] Evlyn Clines  [phone] 3 3 3 3   [facility] (No Humana) [address], [place] [phone] 2 2 4 4   [facility]) [address], [place]an [phone] 2 1 1 1   [facility] [address] [place]an [phone] 4 3 5 5   [facility] [place] [address], [place] [phone] 1 1 1 1           [facility] [address] [place], [place] [phone] 5 4 5 5   [facility])  [address] [place] [phone] 1 1 2 1   [facility]) [address], [place] [phone]  2 4 4   [facility] [address] [place] [phone] 3 5 5 5    [address] [place], [place] [phone] 4 2 2 2   [facility]) [address] [place] [phone] 2 1 3 2        Barriers to Discharge: Continued Medical Work up   Patient Goals and CMS Choice Patient states their goals for this hospitalization and ongoing recovery are:: Rehab CMS Medicare.gov Compare Post Acute Care list provided to:: Patient Represenative (must comment) Choice offered to / list presented to : Adult Children [place] ownership interest in [facility].provided to:: Adult Children    Expected Discharge Plan and Services In-house Referral: Clinical Social Work   Post Acute Care Choice: Skilled Nursing Facility Living arrangements for the past 2 months: Single Family Home                                      Prior Living Arrangements/Services Living arrangements for the past 2 months: Single Family Home Lives with:: Self Patient language and need for interpreter reviewed:: Yes Do you feel safe going back to the place where you live?: Yes      Need for Family Participation in Patient Care: Yes (Comment) Care giver support system in place?: Yes (comment)   Criminal Activity/Legal Involvement Pertinent to Current Situation/Hospitalization: No - Comment as needed  Activities of Daily Living   ADL Screening (condition at time of admission) Independently performs ADLs?: Yes (appropriate for developmental age) Is the patient deaf or have difficulty hearing?: No Does the patient have difficulty seeing, even when wearing glasses/contacts?: No Does the patient have difficulty concentrating, remembering, or making decisions?: No  Permission Sought/Granted Permission sought to share information with : Facility Medical sales representative, Family Supports Permission granted to share information with : Yes, Verbal Permission Granted  Share Information with NAME: Heidi  Permission granted to share info w  AGENCY: SNFs  Permission granted to share info w Relationship: Daughter  Permission granted to share info w Contact Information: [phone]  Emotional Assessment Appearance:: Appears stated age Attitude/Demeanor/Rapport: Unable to Assess Affect (typically observed): Unable to Assess Orientation: : Oriented to Self, Fluctuating Orientation (Suspected and/or reported Sundowners), Oriented to Place, Oriented to  Time, Oriented to Situation Alcohol / Substance Use: Not Applicable Psych Involvement: No (comment)  Admission diagnosis:  Duodenal adenoma [D13.2] Patient Active Problem List   Diagnosis Date Noted   Duodenal adenoma 11/23/2023   Upper GI bleed 11/08/2023   Hyponatremia 11/07/2023   Duodenal cancer (HCC) 10/26/2023   Positive fecal occult blood test 10/26/2023   Duodenal mass 10/25/2023   Symptomatic anemia 10/24/2023   Nonrheumatic aortic valve stenosis 07/30/2022   Abnormal stress  test 07/30/2022   Pyelonephritis 09/19/2017   Essential hypertension 09/19/2017   Hyperlipidemia 09/19/2017   Pulmonary vascular congestion 09/19/2017   Cameron ulcer 10/05/2013   Iron deficiency anemia due to chronic blood loss 10/04/2013   Other specified gastritis without mention of hemorrhage 10/04/2013   Chest tightness 10/03/2013   Vitamin D deficiency 04/08/2009   GERD 03/26/2009   Diverticulosis of colon 03/26/2009   DIARRHEA 03/26/2009   NEPHROLITHIASIS, HX OF 03/26/2009   PCP:  [name], MD Pharmacy:   [facility] [address], [place] [address] [place] [address] Phone: [phone] Fax: [phone]     Social Determinants of Health (SDOH) Social History: SDOH Screenings   Food Insecurity: No Food Insecurity (11/23/2023)  Housing: Patient Declined (11/23/2023)  Transportation Needs: No Transportation Needs (11/23/2023)  Utilities: Not At Risk (11/23/2023)  Tobacco Use: Low Risk  (11/23/2023)  Recent Concern: Tobacco Use  - Medium Risk (11/07/2023)   SDOH Interventions:     Readmission Risk Interventions    10/27/2023    9:03 AM  Readmission Risk Prevention Plan  Transportation Screening Complete  PCP or Specialist Appt within 5-7 Days Complete  Home Care Screening Complete  Medication Review (RN CM) Complete

## 2023-11-29 NOTE — Progress Notes (Addendum)
Verbal Per DR. Byerly- unclamp pain pump and will remove tomorrow AM.  0930: Spoke with pharmacy- Bupivacaine order continued in the [facility] with a stop time for 11/30/23 8AM per Dr. Donell Beers.

## 2023-11-30 LAB — COMPREHENSIVE METABOLIC PANEL
ALT: 16 U/L (ref 0–44)
AST: 23 U/L (ref 15–41)
Albumin: 2.9 g/dL — ABNORMAL LOW (ref 3.5–5.0)
Alkaline Phosphatase: 42 U/L (ref 38–126)
Anion gap: 9 (ref 5–15)
BUN: 35 mg/dL — ABNORMAL HIGH (ref 8–23)
CO2: 26 mmol/L (ref 22–32)
Calcium: 8.6 mg/dL — ABNORMAL LOW (ref 8.9–10.3)
Chloride: 106 mmol/L (ref 98–111)
Creatinine, Ser: 0.64 mg/dL (ref 0.44–1.00)
GFR, Estimated: >60 mL/min (ref >60)
Glucose, Bld: 135 mg/dL — ABNORMAL HIGH (ref 70–99)
Potassium: 3.8 mmol/L (ref 3.5–5.1)
Sodium: 141 mmol/L (ref 135–145)
Total Bilirubin: 1.5 mg/dL — ABNORMAL HIGH (ref <1.2)
Total Protein: 4.7 g/dL — ABNORMAL LOW (ref 6.5–8.1)

## 2023-11-30 LAB — CBC
HCT: 32.3 % — ABNORMAL LOW (ref 36.0–46.0)
Hemoglobin: 10.2 g/dL — ABNORMAL LOW (ref 12.0–15.0)
MCH: 30.4 pg (ref 26.0–34.0)
MCHC: 31.6 g/dL (ref 30.0–36.0)
MCV: 96.1 fL (ref 80.0–100.0)
Platelets: 446 10*3/uL — ABNORMAL HIGH (ref 150–400)
RBC: 3.36 MIL/uL — ABNORMAL LOW (ref 3.87–5.11)
RDW: 14.9 % (ref 11.5–15.5)
WBC: 16.7 10*3/uL — ABNORMAL HIGH (ref 4.0–10.5)
nRBC: 0 % (ref 0.0–0.2)

## 2023-11-30 LAB — GLUCOSE, CAPILLARY
Glucose-Capillary: 116 mg/dL — ABNORMAL HIGH (ref 70–99)
Glucose-Capillary: 115 mg/dL — ABNORMAL HIGH (ref 70–99)

## 2023-11-30 LAB — PHOSPHORUS: Phosphorus: 3.5 mg/dL (ref 2.5–4.6)

## 2023-11-30 MED ORDER — FLUCONAZOLE IN SODIUM CHLORIDE 400-0.9 MG/200ML-% IV SOLN
400.0000 mg | INTRAVENOUS | Status: DC
Start: 2023-11-30 — End: 2023-12-28
  Administered 2023-11-30 – 2023-12-28 (×29): 400 mg via INTRAVENOUS
  Filled 2023-11-30 (×29): qty 200

## 2023-11-30 MED ORDER — ADULT MULTIVITAMIN W/MINERALS CH
1.0000 | ORAL_TABLET | Freq: Every day | ORAL | Status: DC
  Administered 2023-12-01 – 2023-12-04 (×3): 1 via ORAL
  Filled 2023-11-30 (×3): qty 1

## 2023-11-30 MED ORDER — ENSURE ENLIVE PO LIQD
237.0000 mL | Freq: Three times a day (TID) | ORAL | Status: DC
  Administered 2023-11-30 – 2023-12-04 (×10): 237 mL via ORAL

## 2023-11-30 MED ORDER — ADULT MULTIVITAMIN W/MINERALS CH
1.0000 | ORAL_TABLET | Freq: Every day | ORAL | Status: DC
  Administered 2023-11-30: 1
  Filled 2023-11-30: qty 1

## 2023-11-30 MED ORDER — GLUCERNA SHAKE PO LIQD: 237.0000 mL | Freq: Two times a day (BID) | ORAL | Status: DC

## 2023-11-30 MED ORDER — GUAIFENESIN-DM 100-10 MG/5ML PO SYRP
15.0000 mL | ORAL_SOLUTION | ORAL | Status: DC | PRN
  Administered 2023-11-30 – 2023-12-04 (×6): 15 mL via ORAL
  Filled 2023-11-30: qty 15
  Filled 2023-11-30: qty 20
  Filled 2023-11-30 (×5): qty 15

## 2023-11-30 NOTE — Progress Notes (Signed)
Initial Nutrition Assessment  DOCUMENTATION CODES:  Severe malnutrition in context of acute illness/injury  INTERVENTION:  Continue current diet as ordered, encourage PO intake Ensure Enlive po TID, each supplement provides 350 kcal and 20 grams of protein. MVI with minerals daily 48-hour kcal count per MD request to assess nutrition intake  NUTRITION DIAGNOSIS:   Severe Malnutrition (in the context of acute illness) related to  (inadequate energy intake) as evidenced by energy intake < or equal to 50% for > or equal to 5 days, moderate fat depletion, moderate muscle depletion.  GOAL:   Patient will meet greater than or equal to 90% of their needsmalignant    MONITOR:  PO intake, Supplement acceptance, Labs, Weight trends  REASON FOR ASSESSMENT:  Consult Assessment of nutrition requirement/status  ASSESSMENT:  Pt with hx of HTN, HLD, GERD, and diverticulosis presented for planned surgery after a large duodenal mass was seen on imaging at admission in November and was presumed to be malignant. Underwent pancreaticoduodenectomy and placement of pancreatic duct stent.  12/3 - Op, pancreaticoduodenectomy and placement of pancreatic duct stent.  Pt resting in bedside chair at the time of assessment. Lunch tray at bedside minimally consumed. Pt reports that it was the first solid food she has had since her surgery. Discussed clinical course since surgery. Pt with minimal intake for several days after operation with ongoing nausea. Pt reports that for several months prior to the discovery of her mass her intake was also decreased.   Typically eats oatmeal or grits for breakfast, skips lunch, and then cooks "whatever she has around" for dinner. Pt reports maintaining weight around 120 lbs and also that she drinks nutrition supplements at home.   Went over the handout "Diet and Nutrition after a Whipple Procedure" and discussed tips to ensure adequate intake. Also discussed staying hydrated  and monitoring stools for signs of malabsorption. Pt expresses understanding. MD requests calorie count to determine if PO intake is improving/adequate to meet needs. Pt is agreeable to ensure, provided with strawberry flavor prior to leaving unit and pt was consuming when RD left.   Discussed kcal could with RN, hung envelope and requested new measured weight.   Admit weight: 55.3 kg Current weight: 61 kg   Intake/Output Summary (Last 24 hours) at 11/30/2023 1716 Last data filed at 11/30/2023 1400 Gross per 24 hour  Intake 0 ml  Output 1120 ml  Net -1120 ml  Net IO Since Admission: 1,527.59 mL [11/30/23 1716]  Drains/Lines: 14F Drain to the LLQ: 10mL out x 24 hours 14F Drain to the RLQ: out x 24 hours  Average Meal Intake: 12/8: 10% intake x 1 recorded meals  Nutritionally Relevant Medications: Scheduled Meds:  GLUCERNA SHAKE  237 mL Oral BID BM   PROTONIX IV  40 mg Intravenous Q12H   Continuous Infusions:  fluconazole (DIFLUCAN) IV     piperacillin-tazobactam (ZOSYN)  IV 3.375 g (11/30/23 0616)   PRN Meds: ondansetron, prochlorperazine  Labs Reviewed: BUN 35 CBG ranges from 115-160 mg/dL over the last 24 hours HgbA1c 3.7% (11/15/23)  NUTRITION - FOCUSED PHYSICAL EXAM: Flowsheet Row Most Recent Value  Orbital Region Mild depletion  Upper Arm Region Moderate depletion  Thoracic and Lumbar Region Mild depletion  Buccal Region Moderate depletion  Temple Region Severe depletion  Clavicle Bone Region Severe depletion  Clavicle and Acromion Bone Region Severe depletion  Scapular Bone Region Moderate depletion  Dorsal Hand Severe depletion  Patellar Region Severe depletion  Anterior Thigh Region Severe depletion  Posterior Calf Region Mild depletion  Edema (RD Assessment) None  Hair Reviewed  Eyes Reviewed  Mouth Reviewed  Skin Reviewed  Nails Reviewed       Diet Order:   Diet Order             DIET SOFT Room service appropriate? Yes; Fluid  consistency: Thin  Diet effective now                   EDUCATION NEEDS:  Education needs have been addressed  Skin:  Skin Assessment: Reviewed RN Assessment  Last BM:  12/6  Height:  Ht Readings from Last 1 Encounters:  11/23/23 4\' 10"  (1.473 m)    Weight:  Wt Readings from Last 1 Encounters:  11/30/23 61 kg    Ideal Body Weight:  43.9 kg  BMI:  Body mass index is 28.09 kg/m.  Estimated Nutritional Needs:  Kcal:  1400-1700 kcal/d Protein:  65-80g/d Fluid:  1.5-1.7L/d    Greig Castilla, RD, LDN Registered Dietitian II RD pager # available in AMION  After hours/weekend pager # available in [facility]

## 2023-11-30 NOTE — Progress Notes (Signed)
7 Days Post-Op   Subjective/Chief Complaint: Less confused today.  Ate a few bites of yogurt.     Objective: Vital signs in last 24 hours: Temp:  [97.8 F (36.6 C)-100.2 F (37.9 C)] 98.8 F (37.1 C) (12/10 0800) Pulse Rate:  [73-96] 75 (12/10 1045) Resp:  [18-31] 24 (12/10 1045) BP: (106-155)/(45-108) 117/47 (12/10 1000) SpO2:  [92 %-97 %] 97 % (12/10 1045) Last BM Date : 11/26/23  Intake/Output from previous day: 12/09 0701 - 12/10 0700 In: 76.9 [IV Piggyback:76.9] Out: 1140 [Urine:900; Drains:240] Intake/Output this shift: No intake/output data recorded.  General appearance: alert, cooperative Resp: breathing comfortably on [place].  GI: soft, non distended, approp tender. Drains serosang. Onq empty Extremities: extremities normal, atraumatic, no cyanosis or edema  Lab Results:  Recent Labs    11/29/23 0926 11/30/23 0522  WBC 20.4* 16.7*  HGB 10.8* 10.2*  HCT 34.0* 32.3*  PLT 552* 446*   BMET Recent Labs    11/29/23 0926 11/30/23 0522  NA 141 141  K 3.3* 3.8  CL 105 106  CO2 22 26  GLUCOSE 138* 135*  BUN 23 35*  CREATININE 0.69 0.64  CALCIUM 8.8* 8.6*   PT/INR No results for input(s): "LABPROT", "INR" in the last 72 hours.  ABG No results for input(s): "PHART", "HCO3" in the last 72 hours.  Invalid input(s): "PCO2", "PO2"   Studies/Results: No results found.  Anti-infectives: Anti-infectives (From admission, onward)    Start     Dose/Rate Route Frequency Ordered Stop   11/26/23 0945  piperacillin-tazobactam (ZOSYN) IVPB 3.375 g        3.375 g 12.5 mL/hr over 240 Minutes Intravenous Every 8 hours 11/26/23 0853     11/23/23 2100  ceFAZolin (ANCEF) IVPB 2g/100 mL premix        2 g 200 mL/hr over 30 Minutes Intravenous Every 8 hours 11/23/23 1654 11/23/23 2214   11/23/23 0700  ceFAZolin (ANCEF) IVPB 2g/100 mL premix        2 g 200 mL/hr over 30 Minutes Intravenous On call to O.R. 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   HOCM - appreciate cards input, diuresing. Repeat CXR tomorrow.  Anemia of chronic disease, chronic blood loss anemia, and acute blood loss anemia - stable.  Hyperglycemia - check fingersticks. Hypophosphatemia - repleted, normal today Hypokalemia - repleted, back to normal. Emesis - improved. Soft diet. Nutrition consult.  Pancreatic leak - Continue Zosyn.  Add fluconazole given continued leukocytosis.  Pain control with prn tylenol, prn robaxin, prn fentanyl.  OOB, IS, PT consult.  Continue lovenox.     SW consult.    LOS: 7 days    Almond Lint 11/30/2023

## 2023-11-30 NOTE — Progress Notes (Signed)
Inpatient Rehab Admissions Coordinator:   Per TOC/family request,  patient was screened for CIR candidacy by Megan Salon, MS, CCC-SLP. At this time, Pt. Appears to be a a potential candidate for CIR. I will place order for rehab consult per protocol for full assessment. Please contact me any with questions.  Megan Salon, MS, CCC-SLP Rehab Admissions Coordinator  [phone] (celll) [phone] (office)

## 2023-11-30 NOTE — Progress Notes (Signed)
Occupational Therapy Treatment Patient Details Name: Christine Morse: [id number] DOB: [birth year] Today's Date: 11/30/2023   History of present illness Patient is an 87 y/o female admitted 11/23/23 with diagnosis of duodenal adenoma and underwent laparoscopic Whipple procedure.  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   OT comments  Pt progressing towards OT goals. Remains mod to max A for LB dressing, min A for transfers with RW. Pt eager for OOB and RN removed some drains prior to OOB activity in addition to catheter. Granddaughter present at end of session. Cognition is improving, following commands appropriately, cooperative and oriented. Plan for post-acute rehab of <3 hours daily remains essential. OT will continue to follow acutely.      If plan is discharge home, recommend the following:  A lot of help with bathing/dressing/bathroom;A little help with walking and/or transfers;Assist for transportation;Assistance with cooking/housework;Help with stairs or ramp for entrance   Equipment Recommendations  BSC/3in1    Recommendations for Other Services      Precautions / Restrictions Precautions Precautions: Fall Precaution Comments: [place] Drains Restrictions Weight Bearing Restrictions: No       Mobility Bed Mobility Overal bed mobility: Needs Assistance Bed Mobility: Rolling, Sidelying to Sit Rolling: Mod assist Sidelying to sit: Mod assist       General bed mobility comments: assist through trunk and UE's    Transfers Overall transfer level: Needs assistance Equipment used: Rolling walker (2 wheels) Transfers: Sit to/from Stand Sit to Stand: Min assist           General transfer comment: cues for technique and assist both forward and boost     Balance Overall balance assessment: Needs assistance Sitting-balance support: Feet supported Sitting balance-[name]: Fair     Standing balance  support: Bilateral upper extremity supported Standing balance-[name]: Poor Standing balance comment: UE support for balance                           ADL either performed or assessed with clinical judgement   ADL Overall ADL's : Needs assistance/impaired Eating/Feeding: Set up;Sitting Eating/Feeding Details (indicate cue type and reason): in recliner Grooming: Wash/dry hands;Wash/dry face;Set up Grooming Details (indicate cue type and reason): sitting in recliner         Upper Body Dressing : Moderate assistance;Sitting Upper Body Dressing Details (indicate cue type and reason): new gown Lower Body Dressing: Moderate assistance;Sit to/from stand Lower Body Dressing Details (indicate cue type and reason): donning socks Toilet Transfer: Minimal assistance;Regular Toilet;Rolling walker (2 wheels)   Toileting- Clothing Manipulation and Hygiene: Moderate assistance;Sit to/from stand Toileting - Clothing Manipulation Details (indicate cue type and reason): able to maintain standing for peri care (post-catheter removal)     Functional mobility during ADLs: Minimal assistance;Rolling walker (2 wheels);Cueing for safety General ADL Comments: needs further education on compensatory strategies for LB ADL    Extremity/Trunk Assessment Upper Extremity Assessment Upper Extremity Assessment: Generalized weakness   Lower Extremity Assessment Lower Extremity Assessment: Generalized weakness        Vision       Perception     Praxis      Cognition Arousal: Alert Behavior During Therapy: WFL for tasks assessed/performed Overall Cognitive Status: Within Functional Limits for tasks assessed  General Comments: Grandaughter present states cognition improving and at or close to baseline        Exercises      Shoulder Instructions       General Comments on 4L O2 with ambulation VSS, RN present, grandaughter in room  post therapy    Pertinent Vitals/ Pain       Pain Assessment Pain Assessment: Faces Faces Pain Scale: Hurts a little bit Pain Location: operative site with mobility Pain Descriptors / Indicators: Discomfort, Grimacing, Operative site guarding Pain Intervention(s): Limited activity within patient's tolerance, Monitored during session, Repositioned  Home Living                                          Prior Functioning/Environment              Frequency  Min 1X/week        Progress Toward Goals  OT Goals(current goals can now be found in the care plan section)  Progress towards OT goals: Progressing toward goals  Acute Rehab OT Goals Patient Stated Goal: get home eventually safely OT Goal Formulation: With patient Time For Goal Achievement: 12/10/23 Potential to Achieve Goals: Good  Plan      Co-evaluation                 AM-PAC OT "6 Clicks" Daily Activity     Outcome Measure   Help from another person eating meals?: A Little Help from another person taking care of personal grooming?: A Little Help from another person toileting, which includes using toliet, bedpan, or urinal?: A Lot Help from another person bathing (including washing, rinsing, drying)?: A Lot Help from another person to put on and taking off regular upper body clothing?: A Little Help from another person to put on and taking off regular lower body clothing?: A Lot 6 Click Score: 15    End of Session Equipment Utilized During Treatment: Rolling walker (2 wheels)  OT Visit Diagnosis: Unsteadiness on feet (R26.81);Muscle weakness (generalized) (M62.81)   Activity Tolerance Patient tolerated treatment well   Patient Left in chair;with call bell/phone within reach;with chair alarm set;with family/visitor present;with nursing/sitter in room   Nurse Communication Mobility status        Time: 1610-9604 OT Time Calculation (min): 38 min  Charges: OT General  Charges $OT Visit: 1 Visit OT Treatments $Self Care/Home Management : 23-37 mins  Nyoka Cowden OTR/L Acute Rehabilitation Services Office: [phone]  Emelda Fear 11/30/2023, 1:53 PM

## 2023-11-30 NOTE — Progress Notes (Signed)
Pt still unable to void in BR. Pt attempted to have a BM but small round stool came out, pt noted to have a rock of stool in the rectum and pt unable to push it out. MD on-call Dr. Dossie Der notified, new order received. Report given to oncoming RN. Dionne Bucy RN

## 2023-11-30 NOTE — Progress Notes (Signed)
  Inpatient Rehabilitation Admissions Coordinator   I met at bedside with patient and her granddaughter. Patient's 2 daughters live local and prefer CIR to rehab in [place]. Pt has 87 yo great granddaughter who lives with her but she works. I discussed goals and expectations of a possible Cir admit but that 24/7 caregiver supports would be needed after a CIR admit. Granddaughter to discuss with her Mom and Aunt and I will follow up tomorrow.  Ottie Glazier, RN, MSN Rehab Admissions Coordinator [phone] 11/30/2023 1:56 PM

## 2023-12-01 ENCOUNTER — Other Ambulatory Visit: Payer: Self-pay

## 2023-12-01 ENCOUNTER — Inpatient Hospital Stay (HOSPITAL_COMMUNITY): Payer: Medicare Other

## 2023-12-01 DIAGNOSIS — E43 Unspecified severe protein-calorie malnutrition: Secondary | ICD-10-CM | POA: Insufficient documentation

## 2023-12-01 DIAGNOSIS — K5730 Diverticulosis of large intestine without perforation or abscess without bleeding: Secondary | ICD-10-CM | POA: Diagnosis not present

## 2023-12-01 DIAGNOSIS — K4090 Unilateral inguinal hernia, without obstruction or gangrene, not specified as recurrent: Secondary | ICD-10-CM | POA: Diagnosis not present

## 2023-12-01 DIAGNOSIS — I517 Cardiomegaly: Secondary | ICD-10-CM | POA: Diagnosis not present

## 2023-12-01 DIAGNOSIS — K449 Diaphragmatic hernia without obstruction or gangrene: Secondary | ICD-10-CM | POA: Diagnosis not present

## 2023-12-01 DIAGNOSIS — I509 Heart failure, unspecified: Secondary | ICD-10-CM | POA: Diagnosis not present

## 2023-12-01 DIAGNOSIS — C170 Malignant neoplasm of duodenum: Secondary | ICD-10-CM | POA: Diagnosis not present

## 2023-12-01 DIAGNOSIS — J90 Pleural effusion, not elsewhere classified: Secondary | ICD-10-CM | POA: Diagnosis not present

## 2023-12-01 DIAGNOSIS — R918 Other nonspecific abnormal finding of lung field: Secondary | ICD-10-CM | POA: Diagnosis not present

## 2023-12-01 LAB — COMPREHENSIVE METABOLIC PANEL
ALT: 15 U/L (ref 0–44)
AST: 21 U/L (ref 15–41)
Albumin: 2.7 g/dL — ABNORMAL LOW (ref 3.5–5.0)
Alkaline Phosphatase: 45 U/L (ref 38–126)
Anion gap: 6 (ref 5–15)
BUN: 31 mg/dL — ABNORMAL HIGH (ref 8–23)
CO2: 26 mmol/L (ref 22–32)
Calcium: 8.6 mg/dL — ABNORMAL LOW (ref 8.9–10.3)
Chloride: 107 mmol/L (ref 98–111)
Creatinine, Ser: 0.75 mg/dL (ref 0.44–1.00)
GFR, Estimated: >60 mL/min (ref >60)
Glucose, Bld: 170 mg/dL — ABNORMAL HIGH (ref 70–99)
Potassium: 3.7 mmol/L (ref 3.5–5.1)
Sodium: 139 mmol/L (ref 135–145)
Total Bilirubin: 1.2 mg/dL — ABNORMAL HIGH (ref <1.2)
Total Protein: 4.3 g/dL — ABNORMAL LOW (ref 6.5–8.1)

## 2023-12-01 LAB — GLUCOSE, CAPILLARY: Glucose-Capillary: 126 mg/dL — ABNORMAL HIGH (ref 70–99)

## 2023-12-01 LAB — CBC
HCT: 30 % — ABNORMAL LOW (ref 36.0–46.0)
Hemoglobin: 9.8 g/dL — ABNORMAL LOW (ref 12.0–15.0)
MCH: 30.8 pg (ref 26.0–34.0)
MCHC: 32.7 g/dL (ref 30.0–36.0)
MCV: 94.3 fL (ref 80.0–100.0)
Platelets: 323 10*3/uL (ref 150–400)
RBC: 3.18 MIL/uL — ABNORMAL LOW (ref 3.87–5.11)
RDW: 15 % (ref 11.5–15.5)
WBC: 18.3 10*3/uL — ABNORMAL HIGH (ref 4.0–10.5)
nRBC: 0 % (ref 0.0–0.2)

## 2023-12-01 LAB — PHOSPHORUS: Phosphorus: 2.7 mg/dL (ref 2.5–4.6)

## 2023-12-01 MED ORDER — SODIUM CHLORIDE 0.9% FLUSH
10.0000 mL | Freq: Two times a day (BID) | INTRAVENOUS | Status: DC
  Administered 2023-12-01 – 2023-12-09 (×13): 10 mL
  Administered 2023-12-09: 40 mL
  Administered 2023-12-10: 20 mL
  Administered 2023-12-10 – 2023-12-11 (×2): 10 mL
  Administered 2023-12-11 – 2023-12-12 (×2): 40 mL
  Administered 2023-12-12 – 2023-12-20 (×17): 10 mL
  Administered 2023-12-21: 30 mL
  Administered 2023-12-21: 10 mL
  Administered 2023-12-22: 40 mL
  Administered 2023-12-22: 10 mL
  Administered 2023-12-23: 40 mL
  Administered 2023-12-23 – 2023-12-24 (×2): 10 mL
  Administered 2023-12-24 – 2023-12-25 (×2): 40 mL
  Administered 2023-12-25 – 2023-12-28 (×4): 10 mL
  Administered 2023-12-28 – 2023-12-29 (×2): 40 mL
  Administered 2023-12-29 – 2023-12-31 (×4): 10 mL
  Administered 2023-12-31: 40 mL
  Administered 2024-01-01 – 2024-01-06 (×6): 10 mL
  Administered 2024-01-06: 20 mL
  Administered 2024-01-07 – 2024-01-10 (×6): 10 mL
  Administered 2024-01-10: 30 mL
  Administered 2024-01-11 – 2024-01-13 (×6): 10 mL

## 2023-12-01 MED ORDER — INSULIN ASPART 100 UNIT/ML IJ SOLN
0.0000 [IU] | INTRAMUSCULAR | Status: DC
  Administered 2023-12-01 – 2023-12-02 (×4): 1 [IU] via SUBCUTANEOUS
  Administered 2023-12-02 (×2): 2 [IU] via SUBCUTANEOUS
  Administered 2023-12-03 – 2023-12-04 (×8): 1 [IU] via SUBCUTANEOUS

## 2023-12-01 MED ORDER — POTASSIUM CHLORIDE 2 MEQ/ML IV SOLN
INTRAVENOUS | Status: AC
  Filled 2023-12-01 (×2): qty 1000

## 2023-12-01 MED ORDER — IOHEXOL 350 MG/ML SOLN
75.0000 mL | Freq: Once | INTRAVENOUS | Status: AC | PRN
  Administered 2023-12-01: 75 mL via INTRAVENOUS

## 2023-12-01 MED ORDER — POTASSIUM CHLORIDE 2 MEQ/ML IV SOLN
INTRAVENOUS | Status: DC
  Filled 2023-12-01: qty 1000

## 2023-12-01 MED ORDER — METOCLOPRAMIDE HCL 5 MG/ML IJ SOLN
5.0000 mg | Freq: Three times a day (TID) | INTRAMUSCULAR | Status: AC
  Administered 2023-12-01 – 2023-12-02 (×3): 5 mg via INTRAVENOUS
  Filled 2023-12-01 (×3): qty 2

## 2023-12-01 MED ORDER — SODIUM CHLORIDE 0.9% FLUSH: 10.0000 mL | INTRAVENOUS | Status: DC | PRN

## 2023-12-01 MED ORDER — THIAMINE HCL 100 MG/ML IJ SOLN
100.0000 mg | Freq: Every day | INTRAMUSCULAR | Status: DC
  Administered 2023-12-01 – 2023-12-02 (×2): 100 mg via INTRAVENOUS
  Filled 2023-12-01 (×2): qty 2

## 2023-12-01 NOTE — Progress Notes (Signed)
Nutrition Brief Note  Last nutrition assessment was 12/10 where a 48 hour kcal count was started. Limited information available for calorie count but overall pt has had poor intake. RN reports she had some breakfast today but did not eat lunch due to vomiting. MD ordered consult for TPN. RN also reports pt was constipated but is now having loose stools.   Reglan added for continued N/V. Per MD likely due to delayed gastric emptying related to pancreatic leak.   Diet: Soft diet  Supplements: Ensure Enlive TID  12/10: Lunch: Nothing recorded  Dinner: Nothing recorded  12/11:  Breakfast: 50% grits, 1 Ensure (435 kcal, 21 gm protein)   Total intake: 435 kcal kcal (31% of minimum estimated needs)  21 gm protein (32% of minimum estimated needs)  INTERVENTION:  Continue current diet as ordered, encourage PO intake Ensure Enlive po TID, each supplement provides 350 kcal and 20 grams of protein. MVI with minerals daily TPN per pharmacy    NUTRITION DIAGNOSIS:    Severe Malnutrition (in the context of acute illness) related to  (inadequate energy intake) as evidenced by energy intake < or equal to 50% for > or equal to 5 days, moderate fat depletion, moderate muscle depletion.   GOAL:    Patient will meet greater than or equal to 90% of their needs   Estimated Nutritional Needs:  Kcal:  1400-1700 kcal/d Protein:  65-80g/d Fluid:  1.5-1.7L/d  Elliot Dally, RD Registered Dietitian  See Amion for more information

## 2023-12-01 NOTE — Progress Notes (Signed)
Peripherally Inserted Central Catheter Placement  The IV Nurse has discussed with the patient and/or persons authorized to consent for the patient, the purpose of this procedure and the potential benefits and risks involved with this procedure.  The benefits include less needle sticks, lab draws from the catheter, and the patient may be discharged home with the catheter. Risks include, but not limited to, infection, bleeding, blood clot (thrombus formation), and puncture of an artery; nerve damage and irregular heartbeat and possibility to perform a PICC exchange if needed/ordered by physician.  Alternatives to this procedure were also discussed.  Bard Power PICC patient education guide, fact sheet on infection prevention and patient information card has been provided to patient /or left at bedside.    PICC Placement Documentation  PICC Double Lumen 12/01/23 Right Basilic 35 cm 0 cm (Active)  Indication for Insertion or Continuance of Line Administration of hyperosmolar/irritating solutions (i.e. TPN, Vancomycin, etc.) 12/01/23 1901  Exposed Catheter (cm) 0 cm 12/01/23 1901  Site Assessment Clean, Dry, Intact 12/01/23 1901  Lumen #1 Status Flushed;Saline locked;Blood return noted 12/01/23 1901  Lumen #2 Status Flushed;Saline locked;Blood return noted 12/01/23 1901  Dressing Type Transparent;Securing device 12/01/23 1901  Dressing Status Antimicrobial disc in place 12/01/23 1901  Line Care Connections checked and tightened 12/01/23 1901  Line Adjustment (NICU/IV Team Only) No 12/01/23 1901  Dressing Intervention New dressing;Adhesive placed at insertion site (IV team only) 12/01/23 1901  Dressing Change Due 12/08/23 12/01/23 1901       Elenore Paddy 12/01/2023, 7:15 PM

## 2023-12-01 NOTE — NC FL2 (Signed)
[place] MEDICAID FL2 LEVEL OF CARE FORM     IDENTIFICATION  Patient Name: Christine Morse Birthdate: 08-Jan-1936 Sex: female Admission Date (Current Location): 11/23/2023  [facility] and [place] Number:   ([place])   Facility and Address:  [facility] [place], [address], [place], [place] [address]      Provider Number: [id number]  Attending Physician Name and Address:  Almond Lint, MD  Relative Name and Phone Number:       Current Level of Care: Hospital Recommended Level of Care: Skilled Nursing Facility Prior Approval Number:    Date Approved/Denied:   PASRR Number: [id number]  Discharge Plan: SNF    Current Diagnoses: Patient Active Problem List   Diagnosis Date Noted   Protein-calorie malnutrition, severe 12/01/2023   Duodenal adenoma 11/23/2023   Upper GI bleed 11/08/2023   Hyponatremia 11/07/2023   Duodenal cancer (HCC) 10/26/2023   Positive fecal occult blood test 10/26/2023   Duodenal mass 10/25/2023   Symptomatic anemia 10/24/2023   Nonrheumatic aortic valve stenosis 07/30/2022   Abnormal stress test 07/30/2022   Pyelonephritis 09/19/2017   Essential hypertension 09/19/2017   Hyperlipidemia 09/19/2017   Pulmonary vascular congestion 09/19/2017   Cameron ulcer 10/05/2013   Iron deficiency anemia due to chronic blood loss 10/04/2013   Other specified gastritis without mention of hemorrhage 10/04/2013   Chest tightness 10/03/2013   Vitamin D deficiency 04/08/2009   GERD 03/26/2009   Diverticulosis of colon 03/26/2009   DIARRHEA 03/26/2009   NEPHROLITHIASIS, HX OF 03/26/2009    Orientation RESPIRATION BLADDER Height & Weight     Self, Time, Situation, Place  O2 Incontinent, Indwelling catheter Weight: 134 lb 6.4 oz (61 kg) Height:  4\' 10"  (147.3 cm)  BEHAVIORAL SYMPTOMS/MOOD NEUROLOGICAL BOWEL NUTRITION STATUS      Continent Diet (See dc summary)  AMBULATORY STATUS COMMUNICATION OF NEEDS Skin   Limited Assist Verbally  Surgical wounds (Closed incision on abdomen)                       Personal Care Assistance Level of Assistance  Bathing, Feeding, Dressing Bathing Assistance: Limited assistance Feeding assistance: Limited assistance Dressing Assistance: Limited assistance     Functional Limitations Info  Sight Sight Info: Impaired        SPECIAL CARE FACTORS FREQUENCY  PT (By licensed PT), OT (By licensed OT)     PT Frequency: 5x/week OT Frequency: 5x/week            Contractures Contractures Info: Not present    Additional Factors Info  Code Status, Allergies Code Status Info: Full Allergies Info: Codeine           Current Medications (12/01/2023):  This is the current hospital active medication list Current Facility-Administered Medications  Medication Dose Route Frequency Provider Last Rate Last Admin   acetaminophen (TYLENOL) tablet 650 mg  650 mg Oral Q6H PRN Almond Lint, MD   650 mg at 12/01/23 0902   Chlorhexidine Gluconate Cloth 2 % PADS 6 each  6 each Topical Q0600 Almond Lint, MD   6 each at 12/01/23 0905   enoxaparin (LOVENOX) injection 40 mg  40 mg Subcutaneous Q24H Almond Lint, MD   40 mg at 12/01/23 0902   feeding supplement (ENSURE ENLIVE / ENSURE PLUS) liquid 237 mL  237 mL Oral TID BM Almond Lint, MD   237 mL at 12/01/23 1306   fentaNYL (SUBLIMAZE) injection 25 mcg  25 mcg Intravenous Q1H PRN Almond Lint,  MD   25 mcg at 11/29/23 0336   fluconazole (DIFLUCAN) IVPB 400 mg  400 mg Intravenous Q24H Almond Lint, MD 100 mL/hr at 12/01/23 0905 Infusion Verify at 12/01/23 0905   guaiFENesin-dextromethorphan (ROBITUSSIN DM) 100-10 MG/5ML syrup 15 mL  15 mL Oral Q4H PRN Almond Lint, MD   15 mL at 12/01/23 0902   haloperidol lactate (HALDOL) injection 2 mg  2 mg Intravenous Q8H PRN Almond Lint, MD   2 mg at 11/26/23 2241   hydrALAZINE (APRESOLINE) injection 5 mg  5 mg Intravenous Q6H PRN Wendall Stade, MD       ipratropium-albuterol (DUONEB) 0.5-2.5 (3)  MG/3ML nebulizer solution 3 mL  3 mL Nebulization Q6H PRN Almond Lint, MD       labetalol (NORMODYNE) injection 20 mg  20 mg Intravenous Q4H Wendall Stade, MD   20 mg at 12/01/23 1258   labetalol (NORMODYNE) injection 5 mg  5 mg Intravenous Q2H PRN Wendall Stade, MD       methocarbamol (ROBAXIN) injection 500 mg  500 mg Intravenous Q8H PRN Almond Lint, MD   500 mg at 12/01/23 0849   metoCLOPramide (REGLAN) injection 5 mg  5 mg Intravenous Q8H Almond Lint, MD       multivitamin with minerals tablet 1 tablet  1 tablet Oral Daily Almond Lint, MD   1 tablet at 12/01/23 0902   ondansetron (ZOFRAN) injection 4 mg  4 mg Intravenous Q6H PRN Almond Lint, MD   4 mg at 11/30/23 1642   Oral care mouth rinse  15 mL Mouth Rinse PRN Almond Lint, MD       pantoprazole (PROTONIX) injection 40 mg  40 mg Intravenous Q12H Moise Boring, MD   40 mg at 12/01/23 0849   piperacillin-tazobactam (ZOSYN) IVPB 3.375 g  3.375 g Intravenous Q8H Calton Dach I, RPH 12.5 mL/hr at 12/01/23 0913 3.375 g at 12/01/23 0913   prochlorperazine (COMPAZINE) tablet 10 mg  10 mg Oral Q6H PRN Almond Lint, MD       Or   prochlorperazine (COMPAZINE) injection 5-10 mg  5-10 mg Intravenous Q6H PRN Almond Lint, MD   10 mg at 12/01/23 1258     Discharge Medications: Please see discharge summary for a list of discharge medications.  Relevant Imaging Results:  Relevant Lab Results:   Additional Information SSn; [id number]  Eduard Roux, [place]

## 2023-12-01 NOTE — Care Management Important Message (Signed)
Important Message  Patient Details  Name: OLANNA SCHWERDT MRN: [id number] Date of Birth: 1936/08/17   Important Message Given:  Yes - Medicare IM     Sherilyn Banker 12/01/2023, 1:24 PM

## 2023-12-01 NOTE — Progress Notes (Signed)
PHARMACY - TOTAL PARENTERAL NUTRITION CONSULT NOTE   Indication:  delayed gastric emptying  Patient Measurements: Height: 4\' 10"  (147.3 cm) Weight: 61 kg (134 lb 6.4 oz) IBW/kg (Calculated) : 40.9 TPN AdjBW (KG): 44.5 Body mass index is 28.09 kg/m. Usual Weight: 54-61 kg (2014-2024)   Assessment: Patient is an [name] with duodenal adenoma/cancer admitted on 12/3 for diagnostic laparoscopy, pancreaticoduodenectomy (Whipple procedure), and placement of pancreatic duct stent.   Patient has had minimal PO intake since being post-op. Continues to experience nausea. Delayed gastric emptying likely related to possible pancreatic leak.  Pharmacy consulted to start TPN on 12/11. Due to being after 12:00 when consult was entered, dextrose solution will be provided for today with TPN to start tomorrow. Patient also is a refeeding risk, so providing lower percentage of calories on day one will be beneficial to avoid large electrolyte shifts.   Glucose / Insulin: CBGs < 180, A1c 3.7 (11/15/23) Electrolytes: WNL, CoCa = 9.6 Renal: Scr 0.75, stable. UOP 425 mL documented in last 24 hr (0.3 mL/kg/hr) Hepatic: AST/ALT/Alk phos WNL, Tbili 1.2  Intake / Output; MIVF: No mIVF at this time, negative 610 mL in last 24 h, +1.4 L for admission  GI Imaging: 12/07 Ab XR  Unremarkable bowel gas pattern. 12/11 CT Ab P ordered today to rule out abscess GI Surgeries / Procedures:  12/3 diagnostic laparoscopy, pancreaticoduodenectomy (Whipple procedure), and placement of pancreatic duct stent.   Central access: PICC ordered 12/11 TPN start date: 12/12  Nutritional Goals: Clinimix E 8/10 1L at 42 ml/hr on Sunday, Tuesday, Wednesday, Thursday, Saturday   Clinimix E 8/10 2L at 83 ml/hr on Monday and Friday  SMOF lipids daily  Provides 100% AA goal and ~95% of Kcal goal.   RD Assessment: Estimated Needs Total Energy Estimated Needs: 1400-1700 kcal/d Total Protein Estimated Needs: 65-80g/d Total Fluid  Estimated Needs: 1.5-1.7L/d  Current Nutrition:  12/4 NPO, NGT  12/5 Sips and CLD  12/6-12/9 CLD 12/10-12/11 Soft diet  Plan:  D10 1/2 NS 20 mEq KCL @ 41 mL/hr to start tonight to provide 334 kcal (~24% of goal) Electrolytes in Clinimix E 1L bag: Na , K , Mag , Ca 4.33mEq, Phos , Ac , Cl  Electrolytes in Clnimix E 8/10 2L bag: Na 70 mEq, K 60 mEq, Mag 10 mEq, Ca 9 mEq, Phos 30 mmol, Acetate 166 , Cl 152 mEq  Add standard MVI and trace elements to TPN Initiate Sensitive q6h SSI and adjust as needed  Monitor TPN labs on Mon/Thurs Thiamine 100 mg IV daily x 5 days   Cedric Fishman 12/01/2023,2:19 PM

## 2023-12-01 NOTE — TOC Progression Note (Signed)
Transition of Care [facility]) - Progression Note    Patient Details  Name: Christine Morse MRN: [id number] Date of Birth: 06/11/1936  Transition of [facility]) CM/SW Contact  Eduard Roux, [place] Phone Number: 12/01/2023, 1:56 PM  Clinical Narrative:    Received message from CIR - family unable to provide   24/7 support and will need SNF.  Patient's daughter, [name], she  confirmed family wants to move forward with rehab at [facility]. CSW explained the SNF process. No preferred SNF at this time.   TOC will provide bed offers once available TOC will continue to follow and assist with discharge planning.  Antony Blackbird, MSW, LCSW Clinical Social Worker      Barriers to Discharge: Continued Medical Work up  Expected Discharge Plan and Services In-house Referral: Clinical Social Work   Post Acute Care Choice: Skilled Nursing Facility Living arrangements for the past 2 months: Single Family Home                                       Social Determinants of Health (SDOH) Interventions SDOH Screenings   Food Insecurity: No Food Insecurity (11/23/2023)  Housing: Patient Declined (11/23/2023)  Transportation Needs: No Transportation Needs (11/23/2023)  Utilities: Not At Risk (11/23/2023)  Tobacco Use: Low Risk  (11/23/2023)  Recent Concern: Tobacco Use - Medium Risk (11/07/2023)    Readmission Risk Interventions    10/27/2023    9:03 AM  Readmission Risk Prevention Plan  Transportation Screening Complete  PCP or Specialist Appt within 5-7 Days Complete  Home Care Screening Complete  Medication Review (RN CM) Complete

## 2023-12-01 NOTE — Progress Notes (Signed)
Inpatient Rehabilitation Admissions Coordinator   I met with patient at bedside with her grand daughter and spoke with her daughter, Meriam Sprague, by phone. Family can not arrange 24/7 assistance for patient at home after a CIR admit. I do not anticipate that she will reach independent level to return home alone. Therefore ,SNF is recommended. I will alert acute team and TOC .We will sign off.  Ottie Glazier, RN, MSN Rehab Admissions Coordinator [phone] 12/01/2023 12:14 PM

## 2023-12-01 NOTE — Progress Notes (Signed)
PT Cancellation Note  Patient Details Name: TAJ DILS MRN: [id number] DOB: 04/12/1936   Cancelled Treatment:    Reason Eval/Treat Not Completed: Patient not medically ready.  I can't today, I have diarrhea. Will see pt 12/12 as able. 12/01/2023  Jacinto Halim., PT Acute Rehabilitation Services [phone]  (office)   Eliseo Gum Dakai Braithwaite 12/01/2023, 3:10 PM

## 2023-12-01 NOTE — Progress Notes (Signed)
8 Days Post-Op   Subjective/Chief Complaint: Less confused today.  Ate a few bites of yogurt.     Objective: Vital signs in last 24 hours: Temp:  [97.7 F (36.5 C)-98.1 F (36.7 C)] 97.9 F (36.6 C) (12/11 1106) Pulse Rate:  [67-78] 67 (12/11 1106) Resp:  [18-20] 18 (12/11 1106) BP: (120-160)/(52-82) 160/52 (12/11 1106) SpO2:  [93 %-96 %] 95 % (12/11 1106) Weight:  [61 kg] 61 kg (12/10 1403) Last BM Date : 12/01/23  Intake/Output from previous day: 12/10 0701 - 12/11 0700 In: 81.8 [IV Piggyback:81.8] Out: 695 [Urine:500; Drains:195] Intake/Output this shift: Total I/O In: 409.5 [IV Piggyback:409.5] Out: 150 [Drains:150]  General appearance: alert, cooperative Resp: breathing comfortably on .  GI: soft, non distended, approp tender. Drains serosang. Onq empty Extremities: extremities normal, atraumatic, no cyanosis or edema  Lab Results:  Recent Labs    11/30/23 0522 12/01/23 0454  WBC 16.7* 18.3*  HGB 10.2* 9.8*  HCT 32.3* 30.0*  PLT 446* 323   BMET Recent Labs    11/30/23 0522 12/01/23 0454  NA 141 139  K 3.8 3.7  CL 106 107  CO2 26 26  GLUCOSE 135* 170*  BUN 35* 31*  CREATININE 0.64 0.75  CALCIUM 8.6* 8.6*   PT/INR No results for input(s): "LABPROT", "INR" in the last 72 hours.  ABG No results for input(s): "PHART", "HCO3" in the last 72 hours.  Invalid input(s): "PCO2", "PO2"   Studies/Results: [place] EKG SITE RITE  Result Date: 12/01/2023 If Site Rite image not attached, placement could not be confirmed due to current cardiac rhythm.  DG CHEST PORT 1 VIEW  Result Date: 12/01/2023 CLINICAL DATA:  [id number] CHF (congestive heart failure) (HCC) [id number] EXAM: PORTABLE CHEST 1 VIEW COMPARISON:  11/28/2023 FINDINGS: Stable cardiomegaly. Aortic atherosclerosis. Moderate right and small left pleural effusions with hazy bibasilar opacity. No pneumothorax. IMPRESSION: Moderate right and small left pleural effusions with hazy bibasilar opacity, likely  atelectasis. Electronically Signed   By: Duanne Guess D.O.   On: 12/01/2023 09:15    Anti-infectives: Anti-infectives (From admission, onward)    Start     Dose/Rate Route Frequency Ordered Stop   11/30/23 1230  fluconazole (DIFLUCAN) IVPB 400 mg        400 mg 100 mL/hr over 120 Minutes Intravenous Every 24 hours 11/30/23 1135     11/26/23 0945  piperacillin-tazobactam (ZOSYN) IVPB 3.375 g        3.375 g 12.5 mL/hr over 240 Minutes Intravenous Every 8 hours 11/26/23 0853     11/23/23 2100  ceFAZolin (ANCEF) IVPB 2g/100 mL premix        2 g 200 mL/hr over 30 Minutes Intravenous Every 8 hours 11/23/23 1654 11/23/23 2214   11/23/23 0700  ceFAZolin (ANCEF) IVPB 2g/100 mL premix        2 g 200 mL/hr over 30 Minutes Intravenous On call to O.R. 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   HOCM - appreciate cards input, diuresing. Still with some effusions.  Anemia of chronic disease, chronic blood loss anemia, and acute blood loss anemia - stable.  Hyperglycemia - check fingersticks. Hypophosphatemia - repleted, normal today Hypokalemia - repleted, back to normal. Emesis - improved. Soft diet. Nutrition seeing  Pancreatic leak - Continue Zosyn/Fluconazole given continued leukocytosis.  Pain control with prn tylenol, prn robaxin, prn  fentanyl.  OOB, IS, PT consult.  Continue lovenox.   Add 24 h reglan CT to rule out abscess given continued leukocytosis and n/v.   Likely delayed gastric emptying related to panc leak. TPN given poor intake.   SW consult.    LOS: 8 days    Almond Lint 12/01/2023

## 2023-12-02 LAB — GLUCOSE, CAPILLARY
Glucose-Capillary: 169 mg/dL — ABNORMAL HIGH (ref 70–99)
Glucose-Capillary: 171 mg/dL — ABNORMAL HIGH (ref 70–99)
Glucose-Capillary: 100 mg/dL — ABNORMAL HIGH (ref 70–99)
Glucose-Capillary: 127 mg/dL — ABNORMAL HIGH (ref 70–99)
Glucose-Capillary: 136 mg/dL — ABNORMAL HIGH (ref 70–99)
Glucose-Capillary: 144 mg/dL — ABNORMAL HIGH (ref 70–99)

## 2023-12-02 LAB — CBC
HCT: 28.9 % — ABNORMAL LOW (ref 36.0–46.0)
Hemoglobin: 9.1 g/dL — ABNORMAL LOW (ref 12.0–15.0)
MCH: 30.5 pg (ref 26.0–34.0)
MCHC: 31.5 g/dL (ref 30.0–36.0)
MCV: 97 fL (ref 80.0–100.0)
Platelets: 427 10*3/uL — ABNORMAL HIGH (ref 150–400)
RBC: 2.98 MIL/uL — ABNORMAL LOW (ref 3.87–5.11)
RDW: 15.1 % (ref 11.5–15.5)
WBC: 17.5 10*3/uL — ABNORMAL HIGH (ref 4.0–10.5)
nRBC: 0 % (ref 0.0–0.2)

## 2023-12-02 LAB — COMPREHENSIVE METABOLIC PANEL
ALT: 15 U/L (ref 0–44)
AST: 17 U/L (ref 15–41)
Albumin: 2.6 g/dL — ABNORMAL LOW (ref 3.5–5.0)
Alkaline Phosphatase: 45 U/L (ref 38–126)
Anion gap: 9 (ref 5–15)
BUN: 24 mg/dL — ABNORMAL HIGH (ref 8–23)
CO2: 27 mmol/L (ref 22–32)
Calcium: 8.5 mg/dL — ABNORMAL LOW (ref 8.9–10.3)
Chloride: 105 mmol/L (ref 98–111)
Creatinine, Ser: 0.47 mg/dL (ref 0.44–1.00)
GFR, Estimated: >60 mL/min (ref >60)
Glucose, Bld: 163 mg/dL — ABNORMAL HIGH (ref 70–99)
Potassium: 3.1 mmol/L — ABNORMAL LOW (ref 3.5–5.1)
Sodium: 141 mmol/L (ref 135–145)
Total Bilirubin: 0.6 mg/dL (ref <1.2)
Total Protein: 4.6 g/dL — ABNORMAL LOW (ref 6.5–8.1)

## 2023-12-02 LAB — MAGNESIUM: Magnesium: 2.1 mg/dL (ref 1.7–2.4)

## 2023-12-02 LAB — PHOSPHORUS: Phosphorus: 3.1 mg/dL (ref 2.5–4.6)

## 2023-12-02 MED ORDER — FAT EMUL FISH OIL/PLANT BASED 20% (SMOFLIPID)IV EMUL
250.0000 mL | INTRAVENOUS | Status: AC
Start: 2023-12-02 — End: 2023-12-03
  Administered 2023-12-02: 250 mL via INTRAVENOUS
  Filled 2023-12-02: qty 250

## 2023-12-02 MED ORDER — ENOXAPARIN SODIUM 40 MG/0.4ML IJ SOSY
40.0000 mg | PREFILLED_SYRINGE | INTRAMUSCULAR | Status: DC
Start: 2023-12-04 — End: 2023-12-07
  Administered 2023-12-04 – 2023-12-07 (×4): 40 mg via SUBCUTANEOUS
  Filled 2023-12-02 (×4): qty 0.4

## 2023-12-02 MED ORDER — FUROSEMIDE 10 MG/ML IJ SOLN
20.0000 mg | Freq: Once | INTRAMUSCULAR | Status: AC
  Administered 2023-12-02: 20 mg via INTRAVENOUS
  Filled 2023-12-02: qty 2

## 2023-12-02 MED ORDER — CLINIMIX E/DEXTROSE (8/10) 8 % IV SOLN
INTRAVENOUS | Status: AC
  Filled 2023-12-02: qty 1000

## 2023-12-02 MED ORDER — POTASSIUM CHLORIDE 10 MEQ/50ML IV SOLN
10.0000 meq | INTRAVENOUS | Status: AC
  Administered 2023-12-02 (×3): 10 meq via INTRAVENOUS
  Filled 2023-12-02 (×2): qty 50

## 2023-12-02 MED ORDER — POTASSIUM CHLORIDE CRYS ER 20 MEQ PO TBCR
40.0000 meq | EXTENDED_RELEASE_TABLET | Freq: Once | ORAL | Status: AC
  Administered 2023-12-02: 40 meq via ORAL
  Filled 2023-12-02: qty 2

## 2023-12-02 NOTE — Progress Notes (Signed)
Physical Therapy Treatment Patient Details Name: Christine Morse MRN: [id number] DOB: 10-Aug-1936 Today's Date: 12/02/2023   History of Present Illness Patient is an 87 y/o female admitted 11/23/23 with diagnosis of duodenal adenoma and underwent laparoscopic Whipple procedure.  Additional drain placement planned 12/13. 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    PT Comments  Pt reports mild nausea and abdominal pain, but agreeable to mobility progression. Pt ambulatory in hallway with use of RW and steadying assist from PT, mod cues needed for form and safety throughout mobility. Pt needing assist for toileting and clean up after BM, typically at baseline pt independent with this. PT to continue to follow.      If plan is discharge home, recommend the following: A little help with walking and/or transfers;Assistance with cooking/housework;Assist for transportation;Help with stairs or ramp for entrance;A little help with bathing/dressing/bathroom   Can travel by private vehicle        Equipment Recommendations  Rolling walker (2 wheels);BSC/3in1    Recommendations for Other Services       Precautions / Restrictions Precautions Precautions: Fall Precaution Comments: Bilat Blake Drains Restrictions Weight Bearing Restrictions Per Provider Order: No     Mobility  Bed Mobility Overal bed mobility: Needs Assistance             General bed mobility comments: up in chair    Transfers Overall transfer level: Needs assistance Equipment used: Rolling walker (2 wheels) Transfers: Sit to/from Stand Sit to Stand: Min assist           General transfer comment: light rise and steady assist, stand x2 from recliner and toilet    Ambulation/Gait Ambulation/Gait assistance: Min assist Gait Distance (Feet): 90 Feet Assistive device: Rolling walker (2 wheels) Gait Pattern/deviations: Step-through pattern, Decreased stride length, Trunk  flexed Gait velocity: decr     General Gait Details: assist to steady, guide RW. Cues for upright posture, placement in RW. SpO2 inaccurate reading throughout, but 92% on RA immediately post-gait   Stairs             Wheelchair Mobility     Tilt Bed    Modified Rankin (Stroke Patients Only)       Balance Overall balance assessment: Needs assistance Sitting-balance support: Feet supported Sitting balance-[name]: Fair     Standing balance support: Bilateral upper extremity supported Standing balance-[name]: Poor Standing balance comment: UE support for balance                            Cognition Arousal: Alert Behavior During Therapy: WFL for tasks assessed/performed Overall Cognitive Status: Within Functional Limits for tasks assessed                                          Exercises      General Comments        Pertinent Vitals/Pain Pain Assessment Pain Assessment: Faces Faces Pain Scale: Hurts little more Pain Location: operative site with mobility Pain Descriptors / Indicators: Discomfort, Grimacing, Operative site guarding Pain Intervention(s): Monitored during session, Limited activity within patient's tolerance, Repositioned    Home Living                          Prior Function  PT Goals (current goals can now be found in the care plan section) Acute Rehab PT Goals Patient Stated Goal: return to independent PT Goal Formulation: With patient/family Time For Goal Achievement: 12/09/23 Potential to Achieve Goals: Good Progress towards PT goals: Progressing toward goals    Frequency    Min 1X/week      PT Plan      Co-evaluation              AM-PAC PT "6 Clicks" Mobility   Outcome Measure  Help needed turning from your back to your side while in a flat bed without using bedrails?: A Little Help needed moving from lying on your back to sitting on the side of a  flat bed without using bedrails?: A Little Help needed moving to and from a bed to a chair (including a wheelchair)?: A Little Help needed standing up from a chair using your arms (e.g., wheelchair or bedside chair)?: A Little Help needed to walk in hospital room?: A Little Help needed climbing 3-5 steps with a railing? : A Lot 6 Click Score: 17    End of Session   Activity Tolerance: Patient limited by fatigue;Patient tolerated treatment well Patient left: in chair;with call bell/phone within reach;with chair alarm set Nurse Communication: Mobility status PT Visit Diagnosis: Other abnormalities of gait and mobility (R26.89);Difficulty in walking, not elsewhere classified (R26.2);Pain     Time: 1535-1558 PT Time Calculation (min) (ACUTE ONLY): 23 min  Charges:    $Gait Training: 8-22 mins $Self Care/Home Management: 8-22 PT General Charges $$ ACUTE PT VISIT: 1 Visit                     Marye Round, PT DPT Acute Rehabilitation Services Secure Chat Preferred  Office [phone]    Aniceto Kyser E Christain [place] 12/02/2023, 5:06 PM

## 2023-12-02 NOTE — Progress Notes (Signed)
PHARMACY - TOTAL PARENTERAL NUTRITION CONSULT NOTE  Indication:  delayed gastric emptying  Patient Measurements: Height: 4\' 10"  (147.3 cm) Weight: 61 kg (134 lb 6.4 oz) IBW/kg (Calculated) : 40.9 TPN AdjBW (KG): 44.5 Body mass index is 28.09 kg/m. 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  Patient was started on a CLD on 12/6-12/9 and then advanced to a soft diet since 12/10.  Patient has had minimal PO intake since being post-op and continues to experience nausea. Delayed gastric emptying likely related to possible pancreatic leak.  Pharmacy consulted to dose TPN.  Glucose / Insulin: A1c 3.7 (11/15/23) - CBGs < 180 Used 2 units SSI in the past 24 hrs Electrolytes: possible refeeding - K down to 3.1, others WNL Renal: SCr < 1, BUN 20s Hepatic: LFTs / tbili WNL, albumin 2.6 Intake / Output; MIVF: UOP not charted (4 occurrences), BM x6, emesis x2 GI Imaging: 12/07 Ab XR: unremarkable bowel gas pattern. 12/11 CT: possible post-op seroma or hematoma vs abscess, distal colonic diverticulosis GI Surgeries / Procedures:  12/3 diagnostic laparoscopy, Whipple procedure, pancreatic duct stent  Central access: PICC placed 12/01/23 TPN start date: 12/02/23  Nutritional Goals: Clinimix E 8/10 1L at 42 ml/hr on Sun/Tues/Wed/Thurs/Fri/Sat Clinimix E 8/10 2L at 83 ml/hr on Mon SMOF lipids daily  Provides 100% AA goal and ~95% of kCal goal  Electrolytes in Clinimix E 1L bag: Na , K , Mag , Ca 4.47mEq, Phos , Ac , Cl  Electrolytes in Clnimix E 8/10 2L bag: Na 70 mEq, K 60 mEq, Mag 10 mEq, Ca 9 mEq, Phos 30 mmol, Acetate 166 , Cl 152 mEq   RD Estimated Needs Total Energy Estimated Needs: 1400-1700 kcal/d Total Protein Estimated Needs: 65-80g/d Total Fluid Estimated Needs: 1.5-1.7L/d  Current Nutrition:  Soft  diet: consume < 10% of meals d/t N/V Ensure Enlive TID - 3 charted as given yesterday (doesn't drink the entire container) 12/11 calorie count: meeting ~30% of kCal and protein needs  Plan:  D/C D10-1/2NS 20K when TPN starts tonight at 1800 Clinimix E 8/10 1L at 42 ml/hr on Sun/Tues/Wed/Thurs/Fri/Sat Clinimix E 8/10 2L at 83 ml/hr on Mon only SMOFlipid daily  Provides a weekly average of 125 kCal and 92g AA per day, meeting 90% of minimal kCal and 115% of protein needs PO multivitamin daily (no MVI and trace elements in TPN) Continue sensitive SSI Q6H KCL x 3 runs and KCL PO Thiamine 100 mg IV daily x 5 days   Monitor TPN labs on Mon/Thurs - labs in AM F/U PO intake to wean TPN  Christine Morse, PharmD, BCPS, BCCCP 12/02/2023, 9:05 AM

## 2023-12-02 NOTE — Progress Notes (Signed)
9 Days Post-Op   Subjective/Chief Complaint: CT last night showed small fluid collection anterior to liver.  Still minimal intake with some n/v.  Minimal pain.   Objective: Vital signs in last 24 hours: Temp:  [97.5 F (36.4 C)-98.8 F (37.1 C)] 97.5 F (36.4 C) (12/12 1100) Pulse Rate:  [64-74] 66 (12/12 1100) Resp:  [17-22] 18 (12/12 1100) BP: (118-151)/(45-59) 135/53 (12/12 1100) SpO2:  [90 %-98 %] 98 % (12/12 1100) Weight:  [61 kg] 61 kg (12/12 0457) Last BM Date : 12/01/23  Intake/Output from previous day: 12/11 0701 - 12/12 0700 In: 1127.1 [I.V.:433.8; IV Piggyback:693.3] Out: 460 [Drains:460] Intake/Output this shift: No intake/output data recorded.  General appearance: alert, cooperative Resp: breathing comfortably on [place].  GI: soft, non distended, approp tender. Drains serosang. Incision without erythema or drainage.   Extremities: extremities normal, atraumatic, no cyanosis or edema  Lab Results:  Recent Labs    12/01/23 0454 12/02/23 0500  WBC 18.3* 17.5*  HGB 9.8* 9.1*  HCT 30.0* 28.9*  PLT 323 427*   BMET Recent Labs    12/01/23 0454 12/02/23 0500  NA 139 141  K 3.7 3.1*  CL 107 105  CO2 26 27  GLUCOSE 170* 163*  BUN 31* 24*  CREATININE 0.75 0.47  CALCIUM 8.6* 8.5*   PT/INR No results for input(s): "LABPROT", "INR" in the last 72 hours.  ABG No results for input(s): "PHART", "HCO3" in the last 72 hours.  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 RADIATION DOSE REDUCTION: This exam was performed according to the departmental dose-optimization program which includes automated exposure control, adjustment of the  mA and/or kV according to patient size and/or use of iterative reconstruction technique. CONTRAST:  75mL OMNIPAQUE IOHEXOL 350 MG/ML SOLN COMPARISON:  10/26/2023 FINDINGS: Lower chest: Moderate bilateral pleural effusions with compressive lower lobe atelectasis. Cardiomegaly without pericardial effusion. Large hiatal hernia. Hepatobiliary: Nonspecific subcapsular fluid collection along the anterior aspect left lobe liver, measuring 10.0 x 4.4 cm. There is mild rim enhancement. Differential includes postoperative seroma or hematoma versus developing abscess. Continued radiographic follow-up recommended. No other focal liver abnormalities. Status post cholecystectomy. No biliary dilatation. Pancreas: Postsurgical changes from pancreatico jejunostomy, with pancreatic duct stent extending into the jejunal lumen. No inflammatory changes or pancreatic duct dilation. Spleen: Normal in size without focal abnormality. Adrenals/Urinary Tract: Numerous bilateral renal cortical cysts are unchanged. No specific follow-up is recommended. Stable bilateral nonobstructing renal calculi. No obstructive uropathy within either kidney. The adrenals are unremarkable. The bladder is moderately distended without filling defect. Punctate gas lucencies within the bladder lumen likely reflect recent instrumentation. Stomach/Bowel: Postsurgical changes are seen from distal gastrectomy and duodenectomy. Gastrojejunostomy within the left upper quadrant appears unremarkable. The afferent jejunal limb leading to the gastrojejunostomy is dilated, measuring up to 3.3 cm in diameter. The small bowel distal to the gastrojejunostomy is decompressed, as is colon. There is diverticulosis of the sigmoid colon without evidence of acute diverticulitis. Large left inguinal hernia contains multiple loops of distal small bowel, without evidence of incarceration or bowel obstruction. Vascular/Lymphatic: Aortic atherosclerosis. No enlarged abdominal or pelvic  lymph nodes. Reproductive: Status post hysterectomy. No adnexal masses. Other: There is a small amount of free fluid in the sub hepatic  space and porta hepatis. Bilateral surgical drains in the upper abdomen traverse the surgical bed and sub hepatic region. No free intraperitoneal gas. Postsurgical changes from midline laparotomy. Large left inguinal hernia contains multiple loops of distal small bowel, without evidence of incarceration, obstruction, or bowel wall ischemia. Musculoskeletal: No acute or destructive bony abnormalities. Reconstructed images demonstrate no additional findings. IMPRESSION: 1. Postsurgical changes from distal gastrectomy, duodenectomy, partial pancreatectomy, pancreaticojejunostomy, and gastrojejunostomy. 2. Dilated afferent jejunal limb proximal to the gastrojejunostomy. The distal small bowel and colon are decompressed, with no evidence of high-grade small bowel obstruction. 3. Subcapsular fluid collection along the ventral margin of the left lobe liver. There is minimal peripheral rim enhancement which is nonspecific. This could reflect postoperative seroma or hematoma, though developing abscess cannot be completely excluded. Continued radiographic follow-up is recommended. 4. Small amount of free fluid also within the right upper quadrant abdomen, with surgical drains in the sub hepatic region and porta hepatis as above. 5. Moderate bilateral pleural effusions with compressive lower lobe atelectasis. 6. Large left inguinal hernia, now containing multiple loops of distal small bowel. No incarceration, obstruction, or bowel ischemia. 7. Distal colonic diverticulosis without diverticulitis. 8. Hiatal hernia. 9.  Aortic Atherosclerosis (ICD10-I70.0). Electronically Signed   By: Sharlet Salina M.D.   On: 12/01/2023 21:40   [place] EKG SITE RITE Result Date: 12/01/2023 If Site Rite image not attached, placement could not be confirmed due to current cardiac rhythm.  DG CHEST PORT 1  VIEW Result Date: 12/01/2023 CLINICAL DATA:  [id number] CHF (congestive heart failure) (HCC) [id number] EXAM: PORTABLE CHEST 1 VIEW COMPARISON:  11/28/2023 FINDINGS: Stable cardiomegaly. Aortic atherosclerosis. Moderate right and small left pleural effusions with hazy bibasilar opacity. No pneumothorax. IMPRESSION: Moderate right and small left pleural effusions with hazy bibasilar opacity, likely atelectasis. Electronically Signed   By: Duanne Guess D.O.   On: 12/01/2023 09:15    Anti-infectives: Anti-infectives (From admission, onward)    Start     Dose/Rate Route Frequency Ordered Stop   11/30/23 1230  fluconazole (DIFLUCAN) IVPB 400 mg        400 mg 100 mL/hr over 120 Minutes Intravenous Every 24 hours 11/30/23 1135     11/26/23 0945  piperacillin-tazobactam (ZOSYN) IVPB 3.375 g        3.375 g 12.5 mL/hr over 240 Minutes Intravenous Every 8 hours 11/26/23 0853     11/23/23 2100  ceFAZolin (ANCEF) IVPB 2g/100 mL premix        2 g 200 mL/hr over 30 Minutes Intravenous Every 8 hours 11/23/23 1654 11/23/23 2214   11/23/23 0700  ceFAZolin (ANCEF) IVPB 2g/100 mL premix        2 g 200 mL/hr over 30 Minutes Intravenous On call to O.R. 11/23/23 0654 11/23/23 1249       Assessment/Plan: s/p Procedure(s) with comments: WHIPPLE PROCEDURE (N/A) 11/23/23 POD 9   Final pathology - adenocarcinoma duodenum, positive margin, positive LN - pT3pN2cM0, discussed with family last week.   HOCM - continue beta blockade and treat hypertension.   Anemia of chronic disease, chronic blood loss anemia, and acute blood loss anemia - stable.  Hyperglycemia - continue to check fingersticks.  CBG 100-171 last 24 hours.  Anticipate this to be worse as TPN gets going.   Hypophosphatemia - repleted, normal  Hypokalemia - replete today. May need repeat tomorrow as I am giving another dose of lasix.   Emesis - continued. Soft diet as tolerated, just eating <400 calories per day at this point.  Nutrition  seeing  Pancreatic leak - Continue Zosyn/Fluconazole given continued leukocytosis.   CT showed fluid collection.  Might not be infected, but given high WBCs and proximity to stomach, have discussed aspiration/drain with IR as the mechanical effect may be causing her n/v and poor appetite.    Pain control with prn tylenol, prn robaxin, prn fentanyl.  OOB, IS, PT consult.  Continue lovenox.   SW consult.    LOS: 9 days    Almond Lint 12/02/2023

## 2023-12-02 NOTE — Consult Note (Signed)
Chief Complaint: Patient was seen in consultation today for Abdominal abscess drainage at the request of Dr Marilynne Halsted   Supervising Physician: Gilmer Mor  Patient Status: [facility] - In-pt  History of Present Illness: Christine Morse is a 87 y.o. female   FULL Code status per pt Post op day 9 - Whipple procedure with Dr Donell Beers  Final pathology - adenocarcinoma duodenum, positive margin, positive LN - pT3pN2cM0,  Leukocytosis CT yesterday:  IMPRESSION: 1. Postsurgical changes from distal gastrectomy, duodenectomy, partial pancreatectomy, pancreaticojejunostomy, and gastrojejunostomy. 2. Dilated afferent jejunal limb proximal to the gastrojejunostomy. The distal small bowel and colon are decompressed, with no evidence of high-grade small bowel obstruction. 3. Subcapsular fluid collection along the ventral margin of the left lobe liver. There is minimal peripheral rim enhancement which is nonspecific. This could reflect postoperative seroma or hematoma, though developing abscess cannot be completely excluded. Continued radiographic follow-up is recommended. 4. Small amount of free fluid also within the right upper quadrant abdomen, with surgical drains in the sub hepatic region and porta hepatis as above. 5. Moderate bilateral pleural effusions with compressive lower lobe atelectasis. 6. Large left inguinal hernia, now containing multiple loops of distal small bowel. No incarceration, obstruction, or bowel ischemia.  Request for abdominal collection drainage Approved with Dr Loreta Ave Scheduled in IR 12/13   Past Medical History:  Diagnosis Date   Anemia    Aortic Stenosis    Arthritis    Cyst of ovary, right 2013   "/US" (10/04/2013)   Diverticulitis    Diverticulosis    Duodenal adenoma    GERD (gastroesophageal reflux disease)    History of blood transfusion    "just today; my HgB is 5.1" (10/04/2013)   History of gastric ulcer    History of hiatal hernia     HOCM (hypertrophic obstructive cardiomyopathy) (HCC) 10/24/2023   Hyperlipidemia    Hypertension    Mitral regurgitation    moderate MR 10/24/23   Nephrolithiasis    "passed them on my own; went away after I quit drinking tea" (10/04/2013)   Osteoporosis    Vitamin D deficiency     Past Surgical History:  Procedure Laterality Date   ABDOMINAL HYSTERECTOMY  1981   BIOPSY  10/25/2023   Procedure: BIOPSY;  Surgeon: Hilarie Fredrickson, MD;  Location: [facility] END[facility]COPY;  Service: Gastroenterology;;   ESOPHAGOGASTRODUODENOSCOPY N/A 10/04/2013   Procedure: ESOPHAGOGASTRODUODENOSCOPY (EGD);  Surgeon: Hart Carwin, MD;  Location: [facility] ENDOSCOPY;  Service: Endoscopy;  Laterality: N/A;   ESOPHAGOGASTRODUODENOSCOPY (EGD) WITH PROPOFOL N/A 10/25/2023   Procedure: ESOPHAGOGASTRODUODENOSCOPY (EGD) WITH PROPOFOL;  Surgeon: Hilarie Fredrickson, MD;  Location: [facility] ENDOSCOPY;  Service: Gastroenterology;  Laterality: N/A;   LAPAROSCOPY N/A 11/23/2023   Procedure: LAPAROSCOPY DIAGNOSTIC;  Surgeon: Almond Lint, MD;  Location: [facility] OR;  Service: General;  Laterality: N/A;  8 HOURS ROOM 10   LEFT OOPHORECTOMY Left 1981   MALONEY DILATION N/A 10/04/2013   Procedure: Elease Hashimoto DILATION;  Surgeon: Hart Carwin, MD;  Location: [facility] ENDOSCOPY;  Service: Endoscopy;  Laterality: N/A;   WHIPPLE PROCEDURE N/A 11/23/2023   Procedure: WHIPPLE PROCEDURE;  Surgeon: Almond Lint, MD;  Location: [facility] OR;  Service: General;  Laterality: N/A;    Allergies: Codeine  Medications: Prior to Admission medications   Medication Sig Start Date End Date Taking? Authorizing Provider  acetaminophen (TYLENOL) 500 MG tablet Take 1,000 mg by mouth every 6 (six) hours as needed for mild pain, moderate pain or headache.   Yes [provider]  Calcium Carbonate-Vitamin D (CALCIUM + D PO) Take 2 tablets by mouth daily.    Yes [provider]  ferrous sulfate 325 (65 FE) MG tablet Take 1 tablet (325 mg total) by mouth daily with breakfast.  10/05/13  Yes Christiane Ha, MD  metoprolol succinate (TOPROL-XL) 25 MG 24 hr tablet Take 0.5 tablets (12.5 mg total) by mouth daily. 10/27/23 11/26/23 Yes Pahwani, Daleen Bo, MD  pantoprazole (PROTONIX) 40 MG tablet Take 1 tablet (40 mg total) by mouth 2 (two) times daily. 11/08/23 05/06/24 Yes Almon Hercules, MD  rosuvastatin (CRESTOR) 5 MG tablet Take 5 mg by mouth daily. 07/10/22  Yes [provider]     Family History  Problem Relation Age of Onset   Heart failure Mother 69   Dementia Father    Ovarian cancer Sister    Cancer Brother        LUNG CANCER   Colon cancer Neg Hx     Social History   Socioeconomic History   Marital status: Widowed    Spouse name: Not on file   Number of children: 2   Years of education: Not on file   Highest education level: Not on file  Occupational History   Occupation: Retired  Tobacco Use   Smoking status: Never   Smokeless tobacco: Never  Vaping Use   Vaping status: Never Used  Substance and Sexual Activity   Alcohol use: No   Drug use: No   Sexual activity: Never  Other Topics Concern   Not on file  Social History Narrative   Lives at home with granddaughter she raised.     Social Drivers of Corporate investment banker Strain: Not on file  Food Insecurity: No Food Insecurity (11/23/2023)   Hunger Vital Sign    Worried About Running Out of Food in the Last Year: Never true    Ran Out of Food in the Last Year: Never true  Transportation Needs: No Transportation Needs (11/23/2023)   PRAPARE - Administrator, Civil Service (Medical): No    Lack of Transportation (Non-Medical): No  Physical Activity: Not on file  Stress: Not on file  Social Connections: Not on file    Review of Systems: A 12 point ROS discussed and pertinent positives are indicated in the HPI above.  All other systems are negative.  Review of Systems  Constitutional:  Positive for activity change and appetite change. Negative for fever.   Respiratory:  Negative for cough and shortness of breath.   Cardiovascular:  Negative for chest pain.  Gastrointestinal:  Positive for abdominal pain.  Neurological:  Positive for weakness.  Psychiatric/Behavioral:  Negative for behavioral problems and confusion.     Vital Signs: BP (!) 135/53 (BP Location: Left Arm)   Pulse 66   Temp (!) 97.5 F (36.4 C) (Axillary)   Resp 18   Ht 4\' 10"  (1.473 m)   Wt 134 lb 6.4 oz (61 kg)   SpO2 98%   BMI 28.09 kg/m   Advance Care Plan: The advanced care plan/surrogate decision maker was discussed at the time of visit and documented in the medical record.    Physical Exam Vitals reviewed.  HENT:     Mouth/Throat:     Mouth: Mucous membranes are moist.  Cardiovascular:     Rate and Rhythm: Normal rate and regular rhythm.     Heart sounds: Normal heart sounds.  Pulmonary:     Effort: Pulmonary effort is  normal.     Breath sounds: Normal breath sounds. No wheezing.  Abdominal:     Tenderness: There is abdominal tenderness.  Musculoskeletal:        General: Normal range of motion.  Skin:    General: Skin is warm.  Neurological:     Mental Status: She is alert and oriented to person, place, and time.  Psychiatric:        Behavior: Behavior normal.     Imaging: CT ABDOMEN PELVIS W CONTRAST Result Date: 12/01/2023 CLINICAL DATA:  Malignant duodenal ulcer status post Whipple procedure EXAM: CT ABDOMEN AND PELVIS WITH CONTRAST TECHNIQUE: Multidetector CT imaging of the abdomen and pelvis was performed using the standard protocol following bolus administration of intravenous contrast. RADIATION DOSE REDUCTION: This exam was performed according to the departmental dose-optimization program which includes automated exposure control, adjustment of the mA and/or kV according to patient size and/or use of iterative reconstruction technique. CONTRAST:  75mL OMNIPAQUE IOHEXOL 350 MG/ML SOLN COMPARISON:  10/26/2023 FINDINGS: Lower chest: Moderate  bilateral pleural effusions with compressive lower lobe atelectasis. Cardiomegaly without pericardial effusion. Large hiatal hernia. Hepatobiliary: Nonspecific subcapsular fluid collection along the anterior aspect left lobe liver, measuring 10.0 x 4.4 cm. There is mild rim enhancement. Differential includes postoperative seroma or hematoma versus developing abscess. Continued radiographic follow-up recommended. No other focal liver abnormalities. Status post cholecystectomy. No biliary dilatation. Pancreas: Postsurgical changes from pancreatico jejunostomy, with pancreatic duct stent extending into the jejunal lumen. No inflammatory changes or pancreatic duct dilation. Spleen: Normal in size without focal abnormality. Adrenals/Urinary Tract: Numerous bilateral renal cortical cysts are unchanged. No specific follow-up is recommended. Stable bilateral nonobstructing renal calculi. No obstructive uropathy within either kidney. The adrenals are unremarkable. The bladder is moderately distended without filling defect. Punctate gas lucencies within the bladder lumen likely reflect recent instrumentation. Stomach/Bowel: Postsurgical changes are seen from distal gastrectomy and duodenectomy. Gastrojejunostomy within the left upper quadrant appears unremarkable. The afferent jejunal limb leading to the gastrojejunostomy is dilated, measuring up to 3.3 cm in diameter. The small bowel distal to the gastrojejunostomy is decompressed, as is colon. There is diverticulosis of the sigmoid colon without evidence of acute diverticulitis. Large left inguinal hernia contains multiple loops of distal small bowel, without evidence of incarceration or bowel obstruction. Vascular/Lymphatic: Aortic atherosclerosis. No enlarged abdominal or pelvic lymph nodes. Reproductive: Status post hysterectomy. No adnexal masses. Other: There is a small amount of free fluid in the sub hepatic space and porta hepatis. Bilateral surgical drains in the  upper abdomen traverse the surgical bed and sub hepatic region. No free intraperitoneal gas. Postsurgical changes from midline laparotomy. Large left inguinal hernia contains multiple loops of distal small bowel, without evidence of incarceration, obstruction, or bowel wall ischemia. Musculoskeletal: No acute or destructive bony abnormalities. Reconstructed images demonstrate no additional findings. IMPRESSION: 1. Postsurgical changes from distal gastrectomy, duodenectomy, partial pancreatectomy, pancreaticojejunostomy, and gastrojejunostomy. 2. Dilated afferent jejunal limb proximal to the gastrojejunostomy. The distal small bowel and colon are decompressed, with no evidence of high-grade small bowel obstruction. 3. Subcapsular fluid collection along the ventral margin of the left lobe liver. There is minimal peripheral rim enhancement which is nonspecific. This could reflect postoperative seroma or hematoma, though developing abscess cannot be completely excluded. Continued radiographic follow-up is recommended. 4. Small amount of free fluid also within the right upper quadrant abdomen, with surgical drains in the sub hepatic region and porta hepatis as above. 5. Moderate bilateral pleural effusions with compressive lower lobe atelectasis. 6.  Large left inguinal hernia, now containing multiple loops of distal small bowel. No incarceration, obstruction, or bowel ischemia. 7. Distal colonic diverticulosis without diverticulitis. 8. Hiatal hernia. 9.  Aortic Atherosclerosis (ICD10-I70.0). Electronically Signed   By: Sharlet Salina M.D.   On: 12/01/2023 21:40   [place] EKG SITE RITE Result Date: 12/01/2023 If Site Rite image not attached, placement could not be confirmed due to current cardiac rhythm.  DG CHEST PORT 1 VIEW Result Date: 12/01/2023 CLINICAL DATA:  [id number] CHF (congestive heart failure) (HCC) [id number] EXAM: PORTABLE CHEST 1 VIEW COMPARISON:  11/28/2023 FINDINGS: Stable cardiomegaly. Aortic atherosclerosis.  Moderate right and small left pleural effusions with hazy bibasilar opacity. No pneumothorax. IMPRESSION: Moderate right and small left pleural effusions with hazy bibasilar opacity, likely atelectasis. Electronically Signed   By: Duanne Guess D.O.   On: 12/01/2023 09:15   DG CHEST PORT 1 VIEW Result Date: 11/28/2023 CLINICAL DATA:  Dyspnea EXAM: PORTABLE CHEST 1 VIEW COMPARISON:  11/07/2023. FINDINGS: Grossly enlarged cardiac silhouette. Bilateral pleural effusions larger on the right there are layering. Interstitial and alveolar opacities consistent with diffuse bilateral pneumonia versus more likely pulmonary edema. Calcified aorta. No pneumothorax. IMPRESSION: Findings suggest worsening CHF. Diffuse bilateral pneumonia cannot be excluded. Electronically Signed   By: Layla Maw M.D.   On: 11/28/2023 11:06   DG Abd Portable 1V Result Date: 11/27/2023 CLINICAL DATA:  Nausea EXAM: PORTABLE ABDOMEN - 1 VIEW COMPARISON:  None Available. FINDINGS: The bowel gas pattern is normal. No radio-opaque calculi or other significant radiographic abnormality are seen. Midline skin staples noted. Catheter overlies the midline lower abdomen. IMPRESSION: Unremarkable bowel gas pattern. Electronically Signed   By: Layla Maw M.D.   On: 11/27/2023 10:09   DG Chest 2 View Result Date: 11/07/2023 CLINICAL DATA:  Shortness of breath.  Lower extremity edema. EXAM: CHEST - 2 VIEW COMPARISON:  10/23/2023 FINDINGS: Cardiomegaly. No confluent opacities, effusions or edema. No acute bony abnormality. Aortic atherosclerosis. IMPRESSION: Cardiomegaly.  No active disease. Electronically Signed   By: Charlett Nose M.D.   On: 11/07/2023 22:46    Labs:  CBC: Recent Labs    11/29/23 0926 11/30/23 0522 12/01/23 0454 12/02/23 0500  WBC 20.4* 16.7* 18.3* 17.5*  HGB 10.8* 10.2* 9.8* 9.1*  HCT 34.0* 32.3* 30.0* 28.9*  PLT 552* 446* 323 427*    COAGS: Recent Labs    11/15/23 1115 11/24/23 0405 11/25/23 0510   INR 1.0 1.4* 1.3*    BMP: Recent Labs    11/29/23 0926 11/30/23 0522 12/01/23 0454 12/02/23 0500  NA 141 141 139 141  K 3.3* 3.8 3.7 3.1*  CL 105 106 107 105  CO2 22 26 26 27   GLUCOSE 138* 135* 170* 163*  BUN 23 35* 31* 24*  CALCIUM 8.8* 8.6* 8.6* 8.5*  CREATININE 0.69 0.64 0.75 0.47  GFRNONAA >60 >60 >60 >60    LIVER FUNCTION TESTS: Recent Labs    11/29/23 0926 11/30/23 0522 12/01/23 0454 12/02/23 0500  BILITOT 1.7* 1.5* 1.2* 0.6  AST 11* 23 21 17   ALT 12 16 15 15   ALKPHOS 40 42 45 45  PROT 5.2* 4.7* 4.3* 4.6*  ALBUMIN 3.4* 2.9* 2.7* 2.6*    TUMOR MARKERS: No results for input(s): "AFPTM", "CEA", "CA199", "CHROMGRNA" in the last 8760 hours.  Assessment and Plan:  Scheduled for abdominal abscess drainage Risks and benefits discussed with the patient including bleeding, infection, damage to adjacent structures, bowel perforation/fistula connection, and sepsis.  All of the patient's questions were answered,  patient is agreeable to proceed. Consent signed and in chart.  Thank you for this interesting consult.  I greatly enjoyed meeting ILANNA MELLICK and look forward to participating in their care.  A copy of this report was sent to the requesting provider on this date.  Electronically Signed: Robet Leu, PA-C 12/02/2023, 2:35 PM   I spent a total of 20 Minutes    in face to face in clinical consultation, greater than 50% of which was counseling/coordinating care for abdominal abscess drainage

## 2023-12-02 NOTE — Progress Notes (Signed)
Nutrition Follow-up  DOCUMENTATION CODES:   Severe malnutrition in context of acute illness/injury  INTERVENTION:   Continue current diet as ordered, encourage PO intake Ensure Enlive po TID, each supplement provides 350 kcal and 20 grams of protein. MVI with minerals PO daily TPN per pharmacy   NUTRITION DIAGNOSIS:   Severe Malnutrition (in the context of acute illness) related to  (inadequate energy intake) as evidenced by energy intake < or equal to 50% for > or equal to 5 days, moderate fat depletion, moderate muscle depletion.  - still applicable   GOAL:   Patient will meet greater than or equal to 90% of their needs  - Meeting via TPN   MONITOR:   PO intake, Supplement acceptance, Labs, Weight trends  REASON FOR ASSESSMENT:   Consult Assessment of nutrition requirement/status  ASSESSMENT:   Pt with hx of HTN, HLD, GERD, and diverticulosis presented for planned surgery after a large duodenal mass was seen on imaging at admission in November and was presumed to be malignant. Underwent pancreaticoduodenectomy and placement of pancreatic duct stent.  12/3 - Op, pancreaticoduodenectomy and placement of pancreatic duct stent.  12/12 - TPN started   Pt started TPN yesterday due to delayed gastric emptying. MD suspects pancreatic leak. Pt has been having poor PO intake. On visit pt feeling better then yesterday but still reports nausea and small amounts of vomiting. Pt did not have breakfast today but was drinking an Ensure on visit.   Yesterday pt had some breakfast and 1 Ensure but did not eat lunch due to vomiting. Unsure if she had dinner.   Potassium low, being supplemented.   Clinimix E 8/10 1L at 42 ml/hr on Sun/Tues/Wed/Thurs/Fri/Sat Clinimix E 8/10 2L at 83 ml/hr on Mon only SMOFlipid daily  Provides a weekly average of 1125 kCal and 92g AA per day, meeting 90% of minimal kCal and 115% of protein needs  Admit weight: 55.3 kg - stated? Current weight:  60.9 kg    Average Meal Intake: 12/8: 8% intake x 1 recorded meals  Nutritionally Relevant Medications: Scheduled Meds:  insulin aspart  0-9 Units Subcutaneous Q4H   multivitamin with minerals  1 tablet Oral Daily   potassium chloride  40 mEq Oral Once   thiamine (VITAMIN B1) injection  100 mg Intravenous Daily   Continuous Infusions:  dextrose 10 % and 0.45 % NaCl 1,000 mL with potassium chloride 20 mEq infusion 41 mL/hr at 12/01/23 1910   TPN (CLINIMIX-E) Adult     And   fat emul(SMOFlipid)     fluconazole (DIFLUCAN) IV 400 mg (12/02/23 1239)   piperacillin-tazobactam (ZOSYN)  IV 3.375 g (12/02/23 1036)    Labs Reviewed: Potassium 3.1 (L), CBG ranges from 100-171 mg/dL over the last 24 hours   Diet Order:   Diet Order             Diet NPO time specified Except for: Sips with Meds  Diet effective midnight           DIET SOFT Room service appropriate? No; Fluid consistency: Thin  Diet effective now                   EDUCATION NEEDS:   Education needs have been addressed  Skin:  Skin Assessment: Skin Integrity Issues: Skin Integrity Issues:: Incisions Incisions: Abdomen  Last BM:  12/11, type 1, large  Height:   Ht Readings from Last 1 Encounters:  11/23/23 4\' 10"  (1.473 m)    Weight:  Wt Readings from Last 1 Encounters:  12/02/23 61 kg    Ideal Body Weight:  43.9 kg  BMI:  Body mass index is 28.09 kg/m.  Estimated Nutritional Needs:   Kcal:  1400-1700 kcal/d  Protein:  65-80g/d  Fluid:  1.5-1.7L/d   Elliot Dally, RD Registered Dietitian  See Amion for more information

## 2023-12-03 ENCOUNTER — Inpatient Hospital Stay (HOSPITAL_COMMUNITY): Payer: Medicare Other

## 2023-12-03 DIAGNOSIS — R918 Other nonspecific abnormal finding of lung field: Secondary | ICD-10-CM | POA: Diagnosis not present

## 2023-12-03 DIAGNOSIS — I517 Cardiomegaly: Secondary | ICD-10-CM | POA: Diagnosis not present

## 2023-12-03 DIAGNOSIS — D132 Benign neoplasm of duodenum: Secondary | ICD-10-CM | POA: Diagnosis not present

## 2023-12-03 DIAGNOSIS — J90 Pleural effusion, not elsewhere classified: Secondary | ICD-10-CM | POA: Diagnosis not present

## 2023-12-03 DIAGNOSIS — Z452 Encounter for adjustment and management of vascular access device: Secondary | ICD-10-CM | POA: Diagnosis not present

## 2023-12-03 LAB — COMPREHENSIVE METABOLIC PANEL
ALT: 14 U/L (ref 0–44)
AST: 16 U/L (ref 15–41)
Albumin: 2.6 g/dL — ABNORMAL LOW (ref 3.5–5.0)
Alkaline Phosphatase: 50 U/L (ref 38–126)
Anion gap: 5 (ref 5–15)
BUN: 18 mg/dL (ref 8–23)
CO2: 27 mmol/L (ref 22–32)
Calcium: 8.7 mg/dL — ABNORMAL LOW (ref 8.9–10.3)
Chloride: 108 mmol/L (ref 98–111)
Creatinine, Ser: 0.75 mg/dL (ref 0.44–1.00)
GFR, Estimated: >60 mL/min (ref >60)
Glucose, Bld: 147 mg/dL — ABNORMAL HIGH (ref 70–99)
Potassium: 3.9 mmol/L (ref 3.5–5.1)
Sodium: 140 mmol/L (ref 135–145)
Total Bilirubin: 0.7 mg/dL (ref <1.2)
Total Protein: 4.7 g/dL — ABNORMAL LOW (ref 6.5–8.1)

## 2023-12-03 LAB — CBC
HCT: 32 % — ABNORMAL LOW (ref 36.0–46.0)
Hemoglobin: 10 g/dL — ABNORMAL LOW (ref 12.0–15.0)
MCH: 30.6 pg (ref 26.0–34.0)
MCHC: 31.3 g/dL (ref 30.0–36.0)
MCV: 97.9 fL (ref 80.0–100.0)
Platelets: 478 10*3/uL — ABNORMAL HIGH (ref 150–400)
RBC: 3.27 MIL/uL — ABNORMAL LOW (ref 3.87–5.11)
RDW: 15 % (ref 11.5–15.5)
WBC: 19.2 10*3/uL — ABNORMAL HIGH (ref 4.0–10.5)
nRBC: 0 % (ref 0.0–0.2)

## 2023-12-03 LAB — GLUCOSE, CAPILLARY
Glucose-Capillary: 147 mg/dL — ABNORMAL HIGH (ref 70–99)
Glucose-Capillary: 133 mg/dL — ABNORMAL HIGH (ref 70–99)
Glucose-Capillary: 144 mg/dL — ABNORMAL HIGH (ref 70–99)
Glucose-Capillary: 125 mg/dL — ABNORMAL HIGH (ref 70–99)
Glucose-Capillary: 128 mg/dL — ABNORMAL HIGH (ref 70–99)
Glucose-Capillary: 134 mg/dL — ABNORMAL HIGH (ref 70–99)

## 2023-12-03 LAB — MAGNESIUM: Magnesium: 2.1 mg/dL (ref 1.7–2.4)

## 2023-12-03 LAB — PHOSPHORUS: Phosphorus: 3.3 mg/dL (ref 2.5–4.6)

## 2023-12-03 LAB — PROTIME-INR
INR: 1.2 (ref 0.8–1.2)
Prothrombin Time: 15.2 s (ref 11.4–15.2)

## 2023-12-03 MED ORDER — MIDAZOLAM HCL 2 MG/2ML IJ SOLN
INTRAMUSCULAR | Status: AC | PRN
  Administered 2023-12-03 (×2): .5 mg via INTRAVENOUS

## 2023-12-03 MED ORDER — LIDOCAINE HCL 1 % IJ SOLN
10.0000 mL | Freq: Once | INTRAMUSCULAR | Status: AC
  Administered 2023-12-03: 10 mL via INTRADERMAL
  Filled 2023-12-03: qty 10

## 2023-12-03 MED ORDER — FAT EMUL FISH OIL/PLANT BASED 20% (SMOFLIPID)IV EMUL
250.0000 mL | INTRAVENOUS | Status: AC
  Administered 2023-12-03: 250 mL via INTRAVENOUS
  Filled 2023-12-03: qty 250

## 2023-12-03 MED ORDER — FENTANYL CITRATE (PF) 100 MCG/2ML IJ SOLN
INTRAMUSCULAR | Status: AC
  Filled 2023-12-03: qty 2

## 2023-12-03 MED ORDER — POTASSIUM CHLORIDE 20 MEQ PO PACK: 20.0000 meq | PACK | Freq: Once | ORAL | Status: DC

## 2023-12-03 MED ORDER — FENTANYL CITRATE (PF) 100 MCG/2ML IJ SOLN
INTRAMUSCULAR | Status: AC | PRN
  Administered 2023-12-03: 25 ug via INTRAVENOUS

## 2023-12-03 MED ORDER — MIDAZOLAM HCL 2 MG/2ML IJ SOLN
INTRAMUSCULAR | Status: AC
  Filled 2023-12-03: qty 2

## 2023-12-03 MED ORDER — FENTANYL CITRATE (PF) 100 MCG/2ML IJ SOLN
INTRAMUSCULAR | Status: AC | PRN
  Administered 2023-12-03 (×3): 25 ug via INTRAVENOUS

## 2023-12-03 MED ORDER — SODIUM CHLORIDE 0.9% FLUSH
5.0000 mL | Freq: Three times a day (TID) | INTRAVENOUS | Status: DC
  Administered 2023-12-03 – 2024-01-18 (×111): 5 mL

## 2023-12-03 MED ORDER — MIDAZOLAM HCL 2 MG/2ML IJ SOLN
INTRAMUSCULAR | Status: AC | PRN
  Administered 2023-12-03: 1 mg via INTRAVENOUS

## 2023-12-03 MED ORDER — CLINIMIX E/DEXTROSE (8/10) 8 % IV SOLN
INTRAVENOUS | Status: AC
  Filled 2023-12-03 (×2): qty 1000

## 2023-12-03 NOTE — Progress Notes (Signed)
PHARMACY - TOTAL PARENTERAL NUTRITION CONSULT NOTE  Indication:  delayed gastric emptying  Patient Measurements: Height: 4\' 10"  (147.3 cm) Weight: 61 kg (134 lb 6.4 oz) IBW/kg (Calculated) : 40.9 TPN AdjBW (KG): 44.5 Body mass index is 28.09 kg/m. 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  Patient was started on a CLD on 12/6-12/9 and then advanced to a soft diet since 12/10.  Patient has had minimal PO intake since being post-op and continues to experience nausea. Delayed gastric emptying likely related to possible pancreatic leak.  Pharmacy consulted to dose TPN.  Glucose / Insulin: A1c 3.7 (11/15/23) - CBGs < 180 Used 6 units SSI in the past 24 hrs Electrolytes: K up to 3.9 post and Lasix 20mg  IV, others WNL Renal: SCr < 1, BUN WNL Hepatic: LFTs / tbili WNL, albumin 2.6 Intake / Output; MIVF: UOP not charted s/p Lasix 20mg  IV (4 occurrences), drain , BM x2, emesis x2 (s/p Reglan x3 doses 12/11-12/12) GI Imaging: 12/07 Ab XR: unremarkable bowel gas pattern. 12/11 CT: possible post-op seroma or hematoma vs abscess, distal colonic diverticulosis GI Surgeries / Procedures:  12/3 diagnostic laparoscopy, Whipple procedure, pancreatic duct stent  Central access: PICC placed 12/01/23 TPN start date: 12/02/23  Nutritional Goals: Clinimix E 8/10 1L at 42 ml/hr on Sun/Tues/Wed/Thurs/Fri/Sat Clinimix E 8/10 2L at 83 ml/hr on Mon SMOF lipids daily  Provides 100% AA goal and ~95% of kCal goal  Electrolytes in Clinimix E 1L bag: Na , K , Mag , Ca 4.54mEq, Phos , Ac , Cl  Electrolytes in Clnimix E 8/10 2L bag: Na 70 mEq, K 60 mEq, Mag 10 mEq, Ca 9 mEq, Phos 30 mmol, Acetate 166 , Cl 152 mEq   RD Estimated Needs Total Energy Estimated Needs: 1400-1700 kcal/d Total Protein Estimated Needs:  65-80g/d Total Fluid Estimated Needs: 1.5-1.7L/d  Current Nutrition:  TPN Soft diet: minimal intake Ensure Enlive TID - 1 charted as given yesterday (doesn't drink the entire container) 12/11 calorie count: meeting ~30% of kCal and protein needs  Plan:  Clinimix E 8/10 1L at 42 ml/hr on Sun/Tues/Wed/Thurs/Fri/Sat Clinimix E 8/10 2L at 83 ml/hr on Mon only SMOFlipid daily  Provides a weekly average of 1257 kCal and 92g AA per day, meeting 90% of minimal kCal and 115% of protein needs PO multivitamin daily (no MVI and trace elements in TPN) Continue sensitive SSI Q6H D/C thiamine  KCL PO Monitor TPN labs on Mon/Thurs - labs in AM F/U abd abscess aspiration/drain placement by IR, PO intake to wean TPN  Vester Titsworth D. Laney Potash, PharmD, BCPS, BCCCP 12/03/2023, 7:26 AM

## 2023-12-03 NOTE — Procedures (Signed)
Interventional Radiology Procedure Note  Procedure: CT guided subhepatic fluid collection aspiration and drainage  Findings: Please refer to procedural dictation for full description. 10 Fr skater in subhepatic space.  Aspiration of approximately 80 mL serosanguinous fluid.  Sample sent for culture.  Complications:  None immediate  Estimated Blood Loss: <5 mL  Recommendations: Keep to bulb suction for now. Follow culture. IR will follow.   Marliss Coots, MD

## 2023-12-03 NOTE — Progress Notes (Signed)
OT Cancellation Note  Patient Details Name: Christine Morse MRN: [id number] DOB: 09/09/1936   Cancelled Treatment:    Reason Eval/Treat Not Completed: Patient not medically ready.  Pt with hypoxic event leading to rapid response call after drain placement.  Will hold therapy for now and check back as able to continue with OT treatment.   Aryonna Gunnerson OTR/L 12/03/2023, 11:15 AM

## 2023-12-03 NOTE — Consult Note (Signed)
[facility] Liaison Note  12/03/2023  Christine Morse 1936/10/28 [id number]  Covering Charlesetta Shanks, RN [facility] hospital liaison)  Location: [facility] Liaison screened the patient remotely at [facility].  Insurance: Medicare   Christine Morse is a 87 y.o. female who is a Primary Care Patient of [name], MD.The patient was screened for 30 day readmission hospitalization with noted extreme risk score for unplanned readmission risk with 3 IP in 6 months.  The patient was assessed for potential Care Management service needs for post hospital transition for care coordination. Review of patient's electronic medical record reveals patient was admitted for Duodenal Adenoma.   Recommendations for SNF currently pending bed offers. It pt is discharged to an affiliated SNF under VBCI liaison will collaborate with PAC-RN to follow. If the facility is not in network with VBCI coverage the facility will address pt's ongoing needs.  Plan: [facility] Liaison will continue to follow progress and disposition to asess for post hospital community care coordination/management needs.  Referral request for community care coordination: pending disposition.   VBCI Care Management/Population Health does not replace or interfere with any arrangements made by the Inpatient Transition of Care team.   For questions contact:   Elliot Cousin, RN, [facility] Liaison [place]   [facility], Population Health Office Hours MTWF  8:00 am-6:00 pm Direct Dial: [phone] mobile [phone] [Office toll free line] Office Hours are M-F 8:30 - 5 pm [email or web address]

## 2023-12-03 NOTE — Progress Notes (Addendum)
   Patient Name: Christine Morse Date of Encounter: 12/03/2023 [facility] Cardiologist: Rollene Rotunda, MD   Interval Summary  .    Duodenal adenocarcinoma.  Hypertrophic cardiomyopathy.  IV beta-blocker due to unable to tolerate p.o. Down in CT imaging.   Vital Signs .    Vitals:   12/03/23 0305 12/03/23 0500 12/03/23 0703 12/03/23 0725  BP: (!) 161/53  (!) 166/91   Pulse:   70   Resp:   (!) 24 20  Temp: 98.6 F (37 C)  98.4 F (36.9 C)   TempSrc: Oral  Oral   SpO2:   94%   Weight:  61 kg    Height:        Intake/Output Summary (Last 24 hours) at 12/03/2023 0908 Last data filed at 12/03/2023 0820 Gross per 24 hour  Intake 767.69 ml  Output 680 ml  Net 87.69 ml      12/03/2023    5:00 AM 12/02/2023    4:57 AM 11/30/2023    2:03 PM  Last 3 Weights  Weight (lbs) 134 lb 6.4 oz 134 lb 6.4 oz 134 lb 6.4 oz  Weight (kg) 60.963 kg 60.963 kg 60.963 kg      Telemetry/ECG    Normal sinus rhythm- Personally Reviewed   Assessment & Plan .     87 year old female with hypertrophic obstructive cardiomyopathy with peak LVOT velocity at rest 3.3 m/s with severe left atrial enlargement and moderate mitral regurgitation, duodenal adenocarcinoma, frail. Reviewed exam from this AM.   Hypertrophic obstructive cardiomyopathy - Currently on labetalol IV. Not able to tolerate p.o. medications.  Unable to tolerate p.o. medications, will transition to p.o. beta-blocker when able.  Hyperlipidemia - Continue to hold p.o. statin, no changes  Hypertension - continuing with IV labetalol  Pancreatic leak and-Zosyn, TPN, adenocarcinoma   For questions or updates, please contact [facility] Please consult [email or web address] for contact info under        Signed, Donato Schultz, MD

## 2023-12-03 NOTE — Progress Notes (Signed)
10 Days Post-Op   Subjective/Chief Complaint: Still feels "yucky."    Objective: Vital signs in last 24 hours: Temp:  [97.5 F (36.4 C)-98.6 F (37 C)] 98.4 F (36.9 C) (12/13 0703) Pulse Rate:  [66-71] 70 (12/13 0703) Resp:  [18-24] 20 (12/13 0725) BP: (98-166)/(53-91) 166/91 (12/13 0703) SpO2:  [94 %-98 %] 94 % (12/13 0703) Weight:  [61 kg] 61 kg (12/13 0500) Last BM Date : 12/02/23  Intake/Output from previous day: 12/12 0701 - 12/13 0700 In: 767.7 [I.V.:717.7; IV Piggyback:50] Out: 605 [Drains:605] Intake/Output this shift: No intake/output data recorded.  General appearance: alert, cooperative. Looks weak  Resp: breathing comfortably on [place].  GI: soft, non distended, approp tender. Drains left bilious and murky. Right murky brown.  Incision without erythema or drainage.   Extremities: extremities normal, atraumatic, no cyanosis or edema  Lab Results:  Recent Labs    12/02/23 0500 12/03/23 0544  WBC 17.5* 19.2*  HGB 9.1* 10.0*  HCT 28.9* 32.0*  PLT 427* 478*   BMET Recent Labs    12/02/23 0500 12/03/23 0544  NA 141 140  K 3.1* 3.9  CL 105 108  CO2 27 27  GLUCOSE 163* 147*  BUN 24* 18  CREATININE 0.47 0.75  CALCIUM 8.5* 8.7*   PT/INR Recent Labs    12/03/23 0544  LABPROT 15.2  INR 1.2    ABG No results for input(s): "PHART", "HCO3" in the last 72 hours.  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 RADIATION DOSE REDUCTION: This exam was performed according to the departmental dose-optimization program which includes automated exposure control, adjustment of the mA and/or kV according to patient size and/or use of  iterative reconstruction technique. CONTRAST:  75mL OMNIPAQUE IOHEXOL 350 MG/ML SOLN COMPARISON:  10/26/2023 FINDINGS: Lower chest: Moderate bilateral pleural effusions with compressive lower lobe atelectasis. Cardiomegaly without pericardial effusion. Large hiatal hernia. Hepatobiliary: Nonspecific subcapsular fluid collection along the anterior aspect left lobe liver, measuring 10.0 x 4.4 cm. There is mild rim enhancement. Differential includes postoperative seroma or hematoma versus developing abscess. Continued radiographic follow-up recommended. No other focal liver abnormalities. Status post cholecystectomy. No biliary dilatation. Pancreas: Postsurgical changes from pancreatico jejunostomy, with pancreatic duct stent extending into the jejunal lumen. No inflammatory changes or pancreatic duct dilation. Spleen: Normal in size without focal abnormality. Adrenals/Urinary Tract: Numerous bilateral renal cortical cysts are unchanged. No specific follow-up is recommended. Stable bilateral nonobstructing renal calculi. No obstructive uropathy within either kidney. The adrenals are unremarkable. The bladder is moderately distended without filling defect. Punctate gas lucencies within the bladder lumen likely reflect recent instrumentation. Stomach/Bowel: Postsurgical changes are seen from distal gastrectomy and duodenectomy. Gastrojejunostomy within the left upper quadrant appears unremarkable. The afferent jejunal limb leading to the gastrojejunostomy is dilated, measuring up to 3.3 cm in diameter. The small bowel distal to the gastrojejunostomy is decompressed, as is colon. There is diverticulosis of the sigmoid colon without evidence of acute diverticulitis. Large left inguinal hernia contains multiple loops of distal small bowel, without evidence of incarceration or bowel obstruction. Vascular/Lymphatic: Aortic atherosclerosis. No enlarged abdominal or pelvic lymph nodes. Reproductive: Status post hysterectomy. No  adnexal masses. Other: There is a small amount of free fluid in the sub hepatic space and  porta hepatis. Bilateral surgical drains in the upper abdomen traverse the surgical bed and sub hepatic region. No free intraperitoneal gas. Postsurgical changes from midline laparotomy. Large left inguinal hernia contains multiple loops of distal small bowel, without evidence of incarceration, obstruction, or bowel wall ischemia. Musculoskeletal: No acute or destructive bony abnormalities. Reconstructed images demonstrate no additional findings. IMPRESSION: 1. Postsurgical changes from distal gastrectomy, duodenectomy, partial pancreatectomy, pancreaticojejunostomy, and gastrojejunostomy. 2. Dilated afferent jejunal limb proximal to the gastrojejunostomy. The distal small bowel and colon are decompressed, with no evidence of high-grade small bowel obstruction. 3. Subcapsular fluid collection along the ventral margin of the left lobe liver. There is minimal peripheral rim enhancement which is nonspecific. This could reflect postoperative seroma or hematoma, though developing abscess cannot be completely excluded. Continued radiographic follow-up is recommended. 4. Small amount of free fluid also within the right upper quadrant abdomen, with surgical drains in the sub hepatic region and porta hepatis as above. 5. Moderate bilateral pleural effusions with compressive lower lobe atelectasis. 6. Large left inguinal hernia, now containing multiple loops of distal small bowel. No incarceration, obstruction, or bowel ischemia. 7. Distal colonic diverticulosis without diverticulitis. 8. Hiatal hernia. 9.  Aortic Atherosclerosis (ICD10-I70.0). Electronically Signed   By: Sharlet Salina M.D.   On: 12/01/2023 21:40   [place] EKG SITE RITE Result Date: 12/01/2023 If Site Rite image not attached, placement could not be confirmed due to current cardiac rhythm.   Anti-infectives: Anti-infectives (From admission, onward)    Start      Dose/Rate Route Frequency Ordered Stop   11/30/23 1230  fluconazole (DIFLUCAN) IVPB 400 mg        400 mg 100 mL/hr over 120 Minutes Intravenous Every 24 hours 11/30/23 1135     11/26/23 0945  piperacillin-tazobactam (ZOSYN) IVPB 3.375 g        3.375 g 12.5 mL/hr over 240 Minutes Intravenous Every 8 hours 11/26/23 0853     11/23/23 2100  ceFAZolin (ANCEF) IVPB 2g/100 mL premix        2 g 200 mL/hr over 30 Minutes Intravenous Every 8 hours 11/23/23 1654 11/23/23 2214   11/23/23 0700  ceFAZolin (ANCEF) IVPB 2g/100 mL premix        2 g 200 mL/hr over 30 Minutes Intravenous On call to O.R. 11/23/23 0654 11/23/23 1249       Assessment/Plan: s/p Procedure(s) with comments: WHIPPLE PROCEDURE (N/A) 11/23/23 POD 10   Final pathology - adenocarcinoma duodenum, positive margin, positive LN - pT3pN2cM0, discussed with family last week.   HOCM - continue beta blockade and treat hypertension.   Anemia of chronic disease, chronic blood loss anemia, and acute blood loss anemia - stable.  Hyperglycemia - continue to check fingersticks.   Hypophosphatemia - repleted, normal  Hypokalemia - repleted yesterday.  No lasix today since having procedure.  Severe protein calorie malnutrition - TNA, nutrition consult.   Pancreatic leak - Continue Zosyn/Fluconazole given continued leukocytosis.  For CT aspiration today.    Pain control with prn tylenol, prn robaxin, prn fentanyl.  OOB, IS, PT consult.  Continue lovenox.   SW consult.    LOS: 10 days    Almond Lint 12/03/2023

## 2023-12-03 NOTE — Significant Event (Signed)
Rapid Response Event Note   Reason for Call :  Hypoxia following abcess drain in CT  Initial Focused Assessment:  On arrival pt sitting up in bed in CT with increased work of breathing. Rhonchi with wet congested cough noted. Moderate amounts of  bile looking fluid suctioned from pts mouth.  Vitals: HR 83, BP 172/64, RR 24, spO2 94% on NRB  Pt transferred back to room on NRB then placed back [place] when in room- spO2 93% on 3L. RN states pt looks about the same as she has with lung sounds and sputum amount/color unchanged.  Interventions:  Orally Suctioned- PTA Placed on NRB- PTA Md made aware- PTA Transported back to room- 4NP02  Plan of Care:  Continue to monitor pt respiratory status with suctioning as needed. RN to call with any changes or concerns.    Event Summary:   MD Notified: PTA Call Time: 1000 Arrival Time: 1002 End Time: 1020  Mordecai Rasmussen, RN

## 2023-12-03 NOTE — Progress Notes (Signed)
Pt is off the unit to IR.  

## 2023-12-03 NOTE — Care Management Important Message (Signed)
Important Message  Patient Details  Name: Christine Morse MRN: [id number] Date of Birth: 03-25-1936   Important Message Given:  Yes - Medicare IM     Sherilyn Banker 12/03/2023, 3:46 PM

## 2023-12-03 NOTE — Progress Notes (Signed)
Pt returned from IR on non-rebreather SpO2 at 98%. Rapid response nurse at bedside pt had an episode of emesis in IR with hypoxia. Attempted to wean pt back to [place] at 6L pt SpO2 dropped to 85% placed non-rebreather back SpO2 improved to 100%. VS WDL. PA Barnetta Chapel came to bedside. Order placed for NT suction. RT called and came to bedside.   Pt currently on 5L SpO2 94%. WOB improved. VS WDL.

## 2023-12-04 LAB — BASIC METABOLIC PANEL
Anion gap: 4 — ABNORMAL LOW (ref 5–15)
BUN: 20 mg/dL (ref 8–23)
CO2: 23 mmol/L (ref 22–32)
Calcium: 8.3 mg/dL — ABNORMAL LOW (ref 8.9–10.3)
Chloride: 109 mmol/L (ref 98–111)
Creatinine, Ser: 0.48 mg/dL (ref 0.44–1.00)
GFR, Estimated: >60 mL/min (ref >60)
Glucose, Bld: 155 mg/dL — ABNORMAL HIGH (ref 70–99)
Potassium: 3.7 mmol/L (ref 3.5–5.1)
Sodium: 136 mmol/L (ref 135–145)

## 2023-12-04 LAB — GLUCOSE, CAPILLARY
Glucose-Capillary: 132 mg/dL — ABNORMAL HIGH (ref 70–99)
Glucose-Capillary: 137 mg/dL — ABNORMAL HIGH (ref 70–99)
Glucose-Capillary: 142 mg/dL — ABNORMAL HIGH (ref 70–99)
Glucose-Capillary: 142 mg/dL — ABNORMAL HIGH (ref 70–99)
Glucose-Capillary: 145 mg/dL — ABNORMAL HIGH (ref 70–99)
Glucose-Capillary: 161 mg/dL — ABNORMAL HIGH (ref 70–99)

## 2023-12-04 LAB — MAGNESIUM: Magnesium: 2.1 mg/dL (ref 1.7–2.4)

## 2023-12-04 LAB — PHOSPHORUS: Phosphorus: 3.2 mg/dL (ref 2.5–4.6)

## 2023-12-04 MED ORDER — INSULIN ASPART 100 UNIT/ML IJ SOLN
0.0000 [IU] | Freq: Three times a day (TID) | INTRAMUSCULAR | Status: DC
  Administered 2023-12-04 (×2): 1 [IU] via SUBCUTANEOUS
  Administered 2023-12-05 (×2): 2 [IU] via SUBCUTANEOUS
  Administered 2023-12-06: 1 [IU] via SUBCUTANEOUS
  Administered 2023-12-07: 2 [IU] via SUBCUTANEOUS

## 2023-12-04 MED ORDER — CLINIMIX E/DEXTROSE (8/10) 8 % IV SOLN
INTRAVENOUS | Status: AC
  Filled 2023-12-04 (×2): qty 1000

## 2023-12-04 MED ORDER — POTASSIUM CHLORIDE CRYS ER 20 MEQ PO TBCR
40.0000 meq | EXTENDED_RELEASE_TABLET | Freq: Once | ORAL | Status: AC
  Administered 2023-12-04: 40 meq via ORAL
  Filled 2023-12-04: qty 2

## 2023-12-04 MED ORDER — FAT EMUL FISH OIL/PLANT BASED 20% (SMOFLIPID)IV EMUL
250.0000 mL | INTRAVENOUS | Status: AC
Start: 2023-12-04 — End: 2023-12-05
  Administered 2023-12-04: 250 mL via INTRAVENOUS
  Filled 2023-12-04: qty 250

## 2023-12-04 NOTE — Progress Notes (Signed)
11 Days Post-Op   Subjective/Chief Complaint: Feeling better today, no complaints voiced.  Objective: Vital signs in last 24 hours: Temp:  [97.7 F (36.5 C)-98.8 F (37.1 C)] 98.3 F (36.8 C) (12/14 0721) Pulse Rate:  [72-86] 81 (12/14 0721) Resp:  [13-23] 20 (12/14 0721) BP: (117-172)/(51-89) 168/69 (12/14 0721) SpO2:  [88 %-99 %] 94 % (12/14 0721) Weight:  [61 kg] 61 kg (12/14 0500) Last BM Date : 12/02/23  Intake/Output from previous day: 12/13 0701 - 12/14 0700 In: 1681.1 [P.O.:240; I.V.:1047.5; IV Piggyback:393.6] Out: 435 [Drains:435] Intake/Output this shift: No intake/output data recorded.  General appearance: alert, cooperative. Looks frail  Resp: breathing comfortably on [place].  GI: soft, non distended, approp tender. Drains left remains bilious and murky. Right murky brown.  Incision without erythema or drainage.   Extremities: extremities normal, atraumatic, no cyanosis or edema  Lab Results:  Recent Labs    12/02/23 0500 12/03/23 0544  WBC 17.5* 19.2*  HGB 9.1* 10.0*  HCT 28.9* 32.0*  PLT 427* 478*   BMET Recent Labs    12/03/23 0544 12/04/23 0329  NA 140 136  K 3.9 3.7  CL 108 109  CO2 27 23  GLUCOSE 147* 155*  BUN 18 20  CREATININE 0.75 0.48  CALCIUM 8.7* 8.3*   PT/INR Recent Labs    12/03/23 0544  LABPROT 15.2  INR 1.2    ABG No results for input(s): "PHART", "HCO3" in the last 72 hours.  Invalid input(s): "PCO2", "PO2"   Studies/Results: CT GUIDED PERITONEAL/RETROPERITONEAL FLUID DRAIN BY PERC CATH Result Date: 12/03/2023 INDICATION: 87 year old female with postoperative, subcapsular fluid collection about the anterior aspect of the left lobe of the liver. EXAM: CT PERC DRAIN PERITONEAL ABCESS COMPARISON:  CT abdomen pelvis from 12/01/2023 MEDICATIONS: The patient is currently admitted to the hospital and receiving intravenous antibiotics. The antibiotics were administered within an appropriate time frame prior to the initiation of  the procedure. ANESTHESIA/SEDATION: Moderate (conscious) sedation was employed during this procedure. A total of Versed 2 mg and Fentanyl 100 mcg was administered intravenously. Moderate Sedation Time: 15 minutes. The patient's level of consciousness and vital signs were monitored continuously by radiology nursing throughout the procedure under my direct supervision. CONTRAST:  None COMPLICATIONS: None immediate. PROCEDURE: RADIATION DOSE REDUCTION: This exam was performed according to the departmental dose-optimization program which includes automated exposure control, adjustment of the mA and/or kV according to patient size and/or use of iterative reconstruction technique. Informed written consent was obtained from the patient after a discussion of the risks, benefits and alternatives to treatment. The patient was placed supine on the CT gantry and a pre procedural CT was performed re-demonstrating the known abscess/fluid collection within the upper abdomen. The procedure was planned. A timeout was performed prior to the initiation of the procedure. The subxiphoid region was prepped and draped in the usual sterile fashion. The overlying soft tissues were anesthetized with 1% lidocaine with epinephrine. Appropriate trajectory was planned with the use of a 22 gauge spinal needle. An 18 gauge trocar needle was advanced into the abscess/fluid collection and a short Amplatz super stiff wire was coiled within the collection. Appropriate positioning was confirmed with a limited CT scan. The tract was serially dilated allowing placement of a 10 [place] all-purpose drainage catheter. Appropriate positioning was confirmed with a limited postprocedural CT scan. Approximately 80 ml of serosanguineous fluid was aspirated. The tube was connected to a bulb suction and sutured in place. A dressing was placed. The patient tolerated the  procedure well without immediate post procedural complication. IMPRESSION: Successful CT guided  placement of a 10 French all purpose drain catheter into the subcapsular anterior hepatic fluid collection with aspiration of approximately 80 mL of serosanguineous fluid. Samples were sent to the laboratory as requested by the ordering clinical team. Marliss Coots, MD [facility] Electronically Signed   By: Marliss Coots M.D.   On: 12/03/2023 14:08   DG CHEST PORT 1 VIEW Result Date: 12/03/2023 CLINICAL DATA:  142230 Pleural effusion 142230 EXAM: PORTABLE CHEST 1 VIEW COMPARISON:  12/01/2023 FINDINGS: Interval placement of a right-sided PICC line with distal tip at the superior cavoatrial junction. Stable cardiomegaly. Aortic atherosclerosis. Moderate bilateral pleural effusions, right greater than left, slightly increased. Hazy bibasilar opacities. No pneumothorax. IMPRESSION: 1. Moderate bilateral pleural effusions, right greater than left, slightly increased. 2. Hazy bibasilar opacities, likely atelectasis. Electronically Signed   By: Duanne Guess D.O.   On: 12/03/2023 10:50    Anti-infectives: Anti-infectives (From admission, onward)    Start     Dose/Rate Route Frequency Ordered Stop   11/30/23 1230  fluconazole (DIFLUCAN) IVPB 400 mg        400 mg 100 mL/hr over 120 Minutes Intravenous Every 24 hours 11/30/23 1135     11/26/23 0945  piperacillin-tazobactam (ZOSYN) IVPB 3.375 g        3.375 g 12.5 mL/hr over 240 Minutes Intravenous Every 8 hours 11/26/23 0853     11/23/23 2100  ceFAZolin (ANCEF) IVPB 2g/100 mL premix        2 g 200 mL/hr over 30 Minutes Intravenous Every 8 hours 11/23/23 1654 11/23/23 2214   11/23/23 0700  ceFAZolin (ANCEF) IVPB 2g/100 mL premix        2 g 200 mL/hr over 30 Minutes Intravenous On call to O.R. 11/23/23 0654 11/23/23 1249       Assessment/Plan: s/p Procedure(s) with comments: WHIPPLE PROCEDURE (N/A) 11/23/23 POD 11   Final pathology - adenocarcinoma duodenum, positive margin, positive LN  - pT3pN2cM0, discussed with family last week.   HOCM - continue beta blockade and treat hypertension.   Anemia of chronic disease, chronic blood loss anemia, and acute blood loss anemia - stable.  Hyperglycemia - continue to check fingersticks.   Hypophosphatemia - currently normal  Hypokalemia - now normal Severe protein calorie malnutrition - TNA, nutrition consult.   Pancreatic leak - Continue Zosyn/Fluconazole given continued leukocytosis.  CT aspiration and drain placement 12/13 (anterior hepatic fluid collection) - serosang. Gram stain - no organisms; cxs pending  Pain control with prn tylenol, prn robaxin, prn fentanyl.  OOB, IS, PT consult.  Continue lovenox.   SW consult.    LOS: 11 days   Stephanie Coup [facility] 12/04/2023

## 2023-12-04 NOTE — Plan of Care (Signed)
  Problem: Education: Goal: Knowledge of General Education information will improve Description: Including pain rating scale, medication(s)/side effects and non-pharmacologic comfort measures Outcome: Progressing   Problem: Clinical Measurements: Goal: Cardiovascular complication will be avoided Outcome: Progressing   Problem: Activity: Goal: Risk for activity intolerance will decrease Outcome: Progressing   Problem: Elimination: Goal: Will not experience complications related to urinary retention Outcome: Progressing   Problem: Safety: Goal: Ability to remain free from injury will improve Outcome: Progressing

## 2023-12-04 NOTE — Progress Notes (Signed)
PHARMACY - TOTAL PARENTERAL NUTRITION CONSULT NOTE  Indication:  delayed gastric emptying  Patient Measurements: Height: 4\' 10"  (147.3 cm) Weight: 61 kg (134 lb 6.3 oz) IBW/kg (Calculated) : 40.9 TPN AdjBW (KG): 44.5 Body mass index is 28.09 kg/m. 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  Patient was started on a CLD on 12/6-12/9 and then advanced to a soft diet since 12/10.  Patient has had minimal PO intake since being post-op and continues to experience nausea. Delayed gastric emptying likely related to possible pancreatic leak.  Pharmacy consulted to dose TPN.  Glucose / Insulin: A1c 3.7 (11/15/23) - CBGs < 180 Used 6 units SSI in the past 24 hrs Electrolytes: K down to 3.7 ( dose not given), others WNL Renal: SCr < 1, BUN WNL Hepatic: LFTs / tbili WNL, albumin 2.6 Intake / Output; MIVF: UOP not charted (2 occurrences, last Lasix 12/12), drain , BM x2 (s/p Reglan x3 doses 12/11-12/12) GI Imaging: 12/07 Ab XR: unremarkable bowel gas pattern. 12/11 CT: possible post-op seroma or hematoma vs abscess, distal colonic diverticulosis GI Surgeries / Procedures:  12/3 diagnostic laparoscopy, Whipple procedure, pancreatic duct stent 12/13 subhepatic fluid collection aspiration and drainage  Central access: PICC placed 12/01/23 TPN start date: 12/02/23  Nutritional Goals: Clinimix E 8/10 1L at 42 ml/hr on Sun/Tues/Wed/Thurs/Fri/Sat Clinimix E 8/10 2L at 83 ml/hr on Mon SMOF lipids daily  Provides 100% AA goal and ~95% of kCal goal  Electrolytes in Clinimix E 1L bag: Na , K , Mag , Ca 4.9mEq, Phos , Ac , Cl  Electrolytes in Clnimix E 8/10 2L bag: Na 70 mEq, K 60 mEq, Mag 10 mEq, Ca 9 mEq, Phos 30 mmol, Acetate 166 , Cl 152 mEq   RD Estimated Needs Total Energy Estimated Needs: 1400-1700  kcal/d Total Protein Estimated Needs: 65-80g/d Total Fluid Estimated Needs: 1.5-1.7L/d  Current Nutrition:  TPN Soft diet: minimal intake, ~10% of meals Ensure Enlive TID - 1 charted as given yesterday (doesn't drink the entire container) 12/11 calorie count: meeting ~30% of kCal and protein needs  Plan:  Clinimix E 8/10 1L at 42 ml/hr on Sun/Tues/Wed/Thurs/Fri/Sat Clinimix E 8/10 2L at 83 ml/hr on Mon only SMOFlipid daily  Provides a weekly average of 1257 kCal and 92g AA per day, meeting 90% of minimal kCal and 115% of protein needs PO multivitamin daily (no MVI and trace elements in TPN) Reduce sensitive SSI to TIDwm - stop if use remains minimal  KCL PO Monitor TPN labs on Mon/Thurs - labs on Mon F/U PO intake to wean TPN  Arlin Sass D. Laney Potash, PharmD, BCPS, BCCCP 12/04/2023, 7:26 AM

## 2023-12-05 ENCOUNTER — Inpatient Hospital Stay (HOSPITAL_COMMUNITY): Payer: Medicare Other

## 2023-12-05 DIAGNOSIS — Z4682 Encounter for fitting and adjustment of non-vascular catheter: Secondary | ICD-10-CM | POA: Diagnosis not present

## 2023-12-05 DIAGNOSIS — R140 Abdominal distension (gaseous): Secondary | ICD-10-CM | POA: Diagnosis not present

## 2023-12-05 LAB — GLUCOSE, CAPILLARY
Glucose-Capillary: 157 mg/dL — ABNORMAL HIGH (ref 70–99)
Glucose-Capillary: 155 mg/dL — ABNORMAL HIGH (ref 70–99)
Glucose-Capillary: 117 mg/dL — ABNORMAL HIGH (ref 70–99)

## 2023-12-05 MED ORDER — FAT EMUL FISH OIL/PLANT BASED 20% (SMOFLIPID)IV EMUL
250.0000 mL | INTRAVENOUS | Status: AC
Start: 2023-12-05 — End: 2023-12-06
  Administered 2023-12-05: 250 mL via INTRAVENOUS
  Filled 2023-12-05: qty 250

## 2023-12-05 MED ORDER — CLINIMIX E/DEXTROSE (8/10) 8 % IV SOLN
INTRAVENOUS | Status: AC
Start: 2023-12-05 — End: 2023-12-06
  Filled 2023-12-05 (×2): qty 1000

## 2023-12-05 MED ORDER — METOCLOPRAMIDE HCL 5 MG/ML IJ SOLN: 10.0000 mg | Freq: Three times a day (TID) | INTRAMUSCULAR | Status: DC

## 2023-12-05 MED ORDER — METOCLOPRAMIDE HCL 5 MG/ML IJ SOLN
5.0000 mg | Freq: Three times a day (TID) | INTRAMUSCULAR | Status: DC
  Administered 2023-12-05 – 2024-01-17 (×130): 5 mg via INTRAVENOUS
  Filled 2023-12-05 (×130): qty 2

## 2023-12-05 NOTE — Progress Notes (Signed)
..  Trauma Event Note    NGT placed R nare x 2 during the night. Pt is alert and states she is aware she pulled the tube out and did so intentionally. Pt reminded of the need for the NGT at this time.  Repeat ABD xray ordered.   Last imported Vital Signs BP (!) 117/55 (BP Location: Left Arm)   Pulse 91   Temp (!) 97.1 F (36.2 C) (Axillary)   Resp (!) 22   Ht 4\' 10"  (1.473 m)   Wt 134 lb 6.3 oz (61 kg)   SpO2 95%   BMI 28.09 kg/m   Trending CBC Recent Labs    12/03/23 0544  WBC 19.2*  HGB 10.0*  HCT 32.0*  PLT 478*    Trending Coag's Recent Labs    12/03/23 0544  INR 1.2    Trending BMET Recent Labs    12/03/23 0544 12/04/23 0329  NA 140 136  K 3.9 3.7  CL 108 109  CO2 27 23  BUN 18 20  CREATININE 0.75 0.48  GLUCOSE 147* 155*      Christine Morse  Trauma Response RN  Please call TRN at [phone] for further assistance.

## 2023-12-05 NOTE — Progress Notes (Signed)
Pt had an episode of coughing and vomiting. Emesis greenish in color. Oral suctioning performed, and a total of of bile/greenish emesis drained out. MD notified. Order to insert/place NG Tube  to decompress the abdomen. Three attempts were unsuccessful. MD notified.

## 2023-12-05 NOTE — Progress Notes (Signed)
PHARMACY - TOTAL PARENTERAL NUTRITION CONSULT NOTE  Indication:  delayed gastric emptying  Patient Measurements: Height: 4\' 10"  (147.3 cm) Weight: 61 kg (134 lb 6.3 oz) IBW/kg (Calculated) : 40.9 TPN AdjBW (KG): 44.5 Body mass index is 28.09 kg/m. 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  Patient was started on a CLD on 12/6-12/9 and then advanced to a soft diet since 12/10.  Patient has had minimal PO intake since being post-op and continues to experience nausea. Delayed gastric emptying likely related to possible pancreatic leak.  Pharmacy consulted to dose TPN.  Glucose / Insulin: A1c 3.7 (11/15/23) - CBGs < 180 Used 4 units SSI in the past 24 hrs Electrolytes: 12/14 labs - K down to 3.7 and PO given, others WNL Renal: SCr < 1, BUN WNL Hepatic: LFTs / tbili WNL, albumin 2.6 Intake / Output; MIVF: UOP not charted (3 occurrences, last Lasix 12/12), NG inserted 12/14 with output, drain , LBM 12/13 (s/p Reglan x3 doses 12/11-12/12, restarted 12/15) GI Imaging: 12/07 Ab XR: unremarkable bowel gas pattern. 12/11 CT: possible post-op seroma or hematoma vs abscess, distal colonic diverticulosis GI Surgeries / Procedures:  12/3 diagnostic laparoscopy, Whipple procedure, pancreatic duct stent 12/13 subhepatic fluid collection aspiration and drainage  Central access: PICC placed 12/01/23 TPN start date: 12/02/23  Nutritional Goals: Clinimix E 8/10 1L at 42 ml/hr on Sun/Tues/Wed/Thurs/Fri/Sat Clinimix E 8/10 2L at 83 ml/hr on Mon SMOF lipids daily  Provides 100% AA goal and ~95% of kCal goal  Electrolytes in Clinimix E 1L bag: Na , K , Mag , Ca 4.73mEq, Phos , Ac , Cl  Electrolytes in Clnimix E 8/10 2L bag: Na 70 mEq, K 60 mEq, Mag 10 mEq, Ca 9 mEq, Phos 30 mmol, Acetate 166 , Cl 152 mEq    RD Estimated Needs Total Energy Estimated Needs: 1400-1700 kcal/d Total Protein Estimated Needs: 65-80g/d Total Fluid Estimated Needs: 1.5-1.7L/d  Current Nutrition:  TPN Soft diet: minimal intake, ~10% of meals Ensure Enlive TID - 3 charted as given yesterday (doesn't drink the entire container) 12/11 calorie count: meeting ~30% of kCal and protein needs  Plan:  Clinimix E 8/10 1L at 42 ml/hr on Sun/Tues/Wed/Thurs/Fri/Sat Clinimix E 8/10 2L at 83 ml/hr on Mon only SMOFlipid daily  Provides a weekly average of 1257 kCal and 92g AA per day, meeting 90% of minimal kCal and 115% of protein needs PO multivitamin daily (no MVI and trace elements in TPN) Continue sensitive SSI to TIDwm Monitor TPN labs on Mon/Thurs F/U PO intake to wean TPN  Leeana Creer D. Laney Potash, PharmD, BCPS, BCCCP 12/05/2023, 10:15 AM

## 2023-12-05 NOTE — Progress Notes (Signed)
12 Days Post-Op   Subjective/Chief Complaint: Agitation overnight, pulling at lines and was placed in mittens by on call team. NG also placed for persistent emesis; had event of 1L of bilious emesis, NG --> 1L addn'l bilious fluid. Feels much better since being placed.  Objective: Vital signs in last 24 hours: Temp:  [97.1 F (36.2 C)-98.4 F (36.9 C)] 97.6 F (36.4 C) (12/15 0720) Pulse Rate:  [74-91] 74 (12/15 0720) Resp:  [19-24] 19 (12/15 0720) BP: (110-172)/(52-82) 127/64 (12/15 0720) SpO2:  [94 %-97 %] 94 % (12/15 0720) Weight:  [61 kg] 61 kg (12/15 0500) Last BM Date : 12/04/23  Intake/Output from previous day: 12/14 0701 - 12/15 0700 In: 998.9 [I.V.:748.9; IV Piggyback:250] Out: 2390 [Emesis/NG output:2150; Drains:240] Intake/Output this shift: No intake/output data recorded.  General appearance: alert, cooperative. Looks quite frail  Resp: breathing comfortably on Datto.  GI: soft, non distended, not significantly tender. Drains left remains bilious and murky. Right murky brown.  Incision without erythema or drainage.   Extremities: extremities normal, atraumatic, no cyanosis or edema  Lab Results:  Recent Labs    12/03/23 0544  WBC 19.2*  HGB 10.0*  HCT 32.0*  PLT 478*   BMET Recent Labs    12/03/23 0544 12/04/23 0329  NA 140 136  K 3.9 3.7  CL 108 109  CO2 27 23  GLUCOSE 147* 155*  BUN 18 20  CREATININE 0.75 0.48  CALCIUM 8.7* 8.3*   PT/INR Recent Labs    12/03/23 0544  LABPROT 15.2  INR 1.2    ABG No results for input(s): "PHART", "HCO3" in the last 72 hours.  Invalid input(s): "PCO2", "PO2"   Studies/Results: DG Abdomen 1 View Result Date: 12/05/2023 CLINICAL DATA:  87 year old female status post nasogastric tube placement. EXAM: ABDOMEN - 1 VIEW COMPARISON:  Abdominal radiograph 12/05/2023. FINDINGS: Nasogastric tube tip is in the antral pre-pyloric region of the stomach. Midline surgical staples. Surgical drain projecting over the  abdomen with tip projecting over the right iliac crest. Small bore drainage catheter with pigtail reformed over the right upper quadrant of the abdomen. Paucity of bowel gas. IMPRESSION: 1. Support apparatus and postoperative changes, as above. Electronically Signed   By: Trudie Reed M.D.   On: 12/05/2023 08:03   DG Abdomen 1 View Result Date: 12/05/2023 CLINICAL DATA:  NG tube EXAM: ABDOMEN - 1 VIEW COMPARISON:  Abdominal x-ray 11/27/2023 FINDINGS: Nasogastric tube tip is at the level of the mid stomach. Midline skin staples and surgical drain are seen in the mid abdomen. Bowel-gas pattern is nonobstructive. IMPRESSION: Nasogastric tube tip is at the level of the mid stomach. Electronically Signed   By: Darliss Cheney M.D.   On: 12/05/2023 01:13   CT GUIDED PERITONEAL/RETROPERITONEAL FLUID DRAIN BY PERC CATH Result Date: 12/03/2023 INDICATION: 87 year old female with postoperative, subcapsular fluid collection about the anterior aspect of the left lobe of the liver. EXAM: CT PERC DRAIN PERITONEAL ABCESS COMPARISON:  CT abdomen pelvis from 12/01/2023 MEDICATIONS: The patient is currently admitted to the hospital and receiving intravenous antibiotics. The antibiotics were administered within an appropriate time frame prior to the initiation of the procedure. ANESTHESIA/SEDATION: Moderate (conscious) sedation was employed during this procedure. A total of Versed 2 mg and Fentanyl 100 mcg was administered intravenously. Moderate Sedation Time: 15 minutes. The patient's level of consciousness and vital signs were monitored continuously by radiology nursing throughout the procedure under my direct supervision. CONTRAST:  None COMPLICATIONS: None immediate. PROCEDURE: RADIATION DOSE REDUCTION: This  exam was performed according to the departmental dose-optimization program which includes automated exposure control, adjustment of the mA and/or kV according to patient size and/or use of iterative reconstruction  technique. Informed written consent was obtained from the patient after a discussion of the risks, benefits and alternatives to treatment. The patient was placed supine on the CT gantry and a pre procedural CT was performed re-demonstrating the known abscess/fluid collection within the upper abdomen. The procedure was planned. A timeout was performed prior to the initiation of the procedure. The subxiphoid region was prepped and draped in the usual sterile fashion. The overlying soft tissues were anesthetized with 1% lidocaine with epinephrine. Appropriate trajectory was planned with the use of a 22 gauge spinal needle. An 18 gauge trocar needle was advanced into the abscess/fluid collection and a short Amplatz super stiff wire was coiled within the collection. Appropriate positioning was confirmed with a limited CT scan. The tract was serially dilated allowing placement of a 10 [place] all-purpose drainage catheter. Appropriate positioning was confirmed with a limited postprocedural CT scan. Approximately 80 ml of serosanguineous fluid was aspirated. The tube was connected to a bulb suction and sutured in place. A dressing was placed. The patient tolerated the procedure well without immediate post procedural complication. IMPRESSION: Successful CT guided placement of a 10 French all purpose drain catheter into the subcapsular anterior hepatic fluid collection with aspiration of approximately 80 mL of serosanguineous fluid. Samples were sent to the laboratory as requested by the ordering clinical team. Marliss Coots, MD [facility] Electronically Signed   By: Marliss Coots M.D.   On: 12/03/2023 14:08    Anti-infectives: Anti-infectives (From admission, onward)    Start     Dose/Rate Route Frequency Ordered Stop   11/30/23 1230  fluconazole (DIFLUCAN) IVPB 400 mg        400 mg 100 mL/hr over 120 Minutes Intravenous Every 24 hours 11/30/23 1135      11/26/23 0945  piperacillin-tazobactam (ZOSYN) IVPB 3.375 g        3.375 g 12.5 mL/hr over 240 Minutes Intravenous Every 8 hours 11/26/23 0853     11/23/23 2100  ceFAZolin (ANCEF) IVPB 2g/100 mL premix        2 g 200 mL/hr over 30 Minutes Intravenous Every 8 hours 11/23/23 1654 11/23/23 2214   11/23/23 0700  ceFAZolin (ANCEF) IVPB 2g/100 mL premix        2 g 200 mL/hr over 30 Minutes Intravenous On call to O.R. 11/23/23 0654 11/23/23 1249       Assessment/Plan: s/p Procedure(s) with comments: WHIPPLE PROCEDURE (N/A) 11/23/23 POD 12   Final pathology - adenocarcinoma duodenum, positive margin, positive LN - pT3pN2cM0, Dr. Donell Beers discussed with family last week.   HOCM - continue beta blockade and treat hypertension.   Anemia of chronic disease, chronic blood loss anemia, and acute blood loss anemia - stable.  Hyperglycemia - continue to check fingersticks.   Hypophosphatemia - currently normal  Hypokalemia - now normal Severe protein calorie malnutrition - TNA, nutrition consult.   Pancreatic leak - Continue Zosyn/Fluconazole given continued leukocytosis.  DGE vs ileus; favored DGE as CT 12/11 did not show any dilation of bowel distal to GJ - NG placed with significant output; continue. Trial of reglan - 10 mg TID, IV.  CT aspiration and drain placement 12/13 (anterior hepatic fluid collection) - serosang. Gram stain - no organisms; NG < 24 hrs, follow  Pain control with prn tylenol, prn  robaxin, prn fentanyl.  OOB, IS, PT consult.  Continue lovenox.   SW consult.    LOS: 12 days   Stephanie Coup [facility] 12/05/2023

## 2023-12-05 NOTE — Progress Notes (Signed)
Trauma Team RN inserted NG Tube on pt. Pt pulled NG Tube out. Trauma Team notified. Trauma Team RN will reinsert NG Tube. Awaiting team member for the reinsertion of the NG Tube.

## 2023-12-06 DIAGNOSIS — D132 Benign neoplasm of duodenum: Secondary | ICD-10-CM | POA: Diagnosis not present

## 2023-12-06 LAB — COMPREHENSIVE METABOLIC PANEL
ALT: 23 U/L (ref 0–44)
AST: 19 U/L (ref 15–41)
Albumin: 2.3 g/dL — ABNORMAL LOW (ref 3.5–5.0)
Alkaline Phosphatase: 87 U/L (ref 38–126)
Anion gap: 6 (ref 5–15)
BUN: 23 mg/dL (ref 8–23)
CO2: 24 mmol/L (ref 22–32)
Calcium: 8.5 mg/dL — ABNORMAL LOW (ref 8.9–10.3)
Chloride: 109 mmol/L (ref 98–111)
Creatinine, Ser: 0.5 mg/dL (ref 0.44–1.00)
GFR, Estimated: >60 mL/min (ref >60)
Glucose, Bld: 134 mg/dL — ABNORMAL HIGH (ref 70–99)
Potassium: 4.3 mmol/L (ref 3.5–5.1)
Sodium: 139 mmol/L (ref 135–145)
Total Bilirubin: 0.6 mg/dL (ref <1.2)
Total Protein: 4.9 g/dL — ABNORMAL LOW (ref 6.5–8.1)

## 2023-12-06 LAB — PHOSPHORUS: Phosphorus: 3.2 mg/dL (ref 2.5–4.6)

## 2023-12-06 LAB — TRIGLYCERIDES: Triglycerides: 103 mg/dL (ref <150)

## 2023-12-06 LAB — GLUCOSE, CAPILLARY
Glucose-Capillary: 133 mg/dL — ABNORMAL HIGH (ref 70–99)
Glucose-Capillary: 115 mg/dL — ABNORMAL HIGH (ref 70–99)
Glucose-Capillary: 108 mg/dL — ABNORMAL HIGH (ref 70–99)
Glucose-Capillary: 132 mg/dL — ABNORMAL HIGH (ref 70–99)

## 2023-12-06 LAB — MAGNESIUM: Magnesium: 2.1 mg/dL (ref 1.7–2.4)

## 2023-12-06 MED ORDER — TRACE MINERALS CU-MN-SE-ZN 300-55-60-3000 MCG/ML IV SOLN
INTRAVENOUS | Status: AC
  Filled 2023-12-06 (×2): qty 2000

## 2023-12-06 MED ORDER — FAT EMUL FISH OIL/PLANT BASED 20% (SMOFLIPID)IV EMUL
250.0000 mL | INTRAVENOUS | Status: AC
  Administered 2023-12-06: 250 mL via INTRAVENOUS
  Filled 2023-12-06: qty 250

## 2023-12-06 NOTE — Plan of Care (Signed)
  Problem: Education: Goal: Knowledge of General Education information will improve Description: Including pain rating scale, medication(s)/side effects and non-pharmacologic comfort measures Outcome: Progressing   Problem: Clinical Measurements: Goal: Respiratory complications will improve Outcome: Progressing Goal: Cardiovascular complication will be avoided Outcome: Progressing   Problem: Activity: Goal: Risk for activity intolerance will decrease Outcome: Progressing   Problem: Coping: Goal: Level of anxiety will decrease Outcome: Progressing   Problem: Elimination: Goal: Will not experience complications related to urinary retention Outcome: Progressing   Problem: Pain Management: Goal: General experience of comfort will improve Outcome: Progressing   Problem: Safety: Goal: Ability to remain free from injury will improve Outcome: Progressing

## 2023-12-06 NOTE — Progress Notes (Signed)
Physical Therapy Treatment Patient Details Name: CAMERYN DELEEUW MRN: [id number] DOB: 11/11/1936 Today's Date: 12/06/2023   History of Present Illness Patient is an 87 y/o female admitted 11/23/23 with diagnosis of duodenal adenoma and underwent laparoscopic Whipple procedure. CT aspiration and drain placement 12/13. NGT inserted 12/15, dislodged then replaced. 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    PT Comments  Pt eager to get OOB, states she has been in bed all day. Pt ambulatory around the unit with light steadying assist, does fatigue with continued gait and needs rest at ~120 ft gait. Pt tolerated x10 LAQs bilat for LE strengthening while up in chair. pt does not complain of abdominal pain during session but is forward flexed throughout gait. Pt progressing well, VSS on 2LO2. PT to continue to follow.  .   If plan is discharge home, recommend the following: A little help with walking and/or transfers;Assistance with cooking/housework;Assist for transportation;Help with stairs or ramp for entrance;A little help with bathing/dressing/bathroom   Can travel by private vehicle        Equipment Recommendations  Rolling walker (2 wheels);BSC/3in1    Recommendations for Other Services       Precautions / Restrictions Precautions Precautions: Fall Precaution Comments: x2 blake drains, x1 JP drain; NGT Restrictions Weight Bearing Restrictions Per Provider Order: No     Mobility  Bed Mobility Overal bed mobility: Needs Assistance Bed Mobility: Rolling, Sidelying to Sit Rolling: Min assist Sidelying to sit: Mod assist       General bed mobility comments: assist for trunk elevation, steadying once sitting EOB    Transfers Overall transfer level: Needs assistance Equipment used: Rolling walker (2 wheels) Transfers: Sit to/from Stand Sit to Stand: Min assist           General transfer comment: light rise and steady assist     Ambulation/Gait Ambulation/Gait assistance: Min assist Gait Distance (Feet): 120 Feet Assistive device: Rolling walker (2 wheels) Gait Pattern/deviations: Step-through pattern, Decreased stride length, Trunk flexed Gait velocity: decr     General Gait Details: assist for steadying, correcting forward flexed posture. cues for placemet in RW and upright posture. SPO2 90% and greater on 2LO2   Stairs             Wheelchair Mobility     Tilt Bed    Modified Rankin (Stroke Patients Only)       Balance Overall balance assessment: Needs assistance Sitting-balance support: Feet supported Sitting balance-[name]: Fair     Standing balance support: Bilateral upper extremity supported Standing balance-[name]: Poor Standing balance comment: UE support for balance                            Cognition Arousal: Alert Behavior During Therapy: WFL for tasks assessed/performed Overall Cognitive Status: Within Functional Limits for tasks assessed                                 General Comments: bout of confusion overnight, but appropriate and pleasant this date        Exercises      General Comments        Pertinent Vitals/Pain Pain Assessment Pain Assessment: Faces Faces Pain Scale: Hurts little more Pain Location: operative site with mobility Pain Descriptors / Indicators: Discomfort, Grimacing, Operative site guarding Pain Intervention(s): Limited activity within patient's tolerance, Monitored  during session, Repositioned    Home Living                          Prior Function            PT Goals (current goals can now be found in the care plan section) Acute Rehab PT Goals Patient Stated Goal: return to independent PT Goal Formulation: With patient/family Time For Goal Achievement: 12/09/23 Potential to Achieve Goals: Good Progress towards PT goals: Progressing toward goals    Frequency    Min  1X/week      PT Plan      Co-evaluation              AM-PAC PT "6 Clicks" Mobility   Outcome Measure  Help needed turning from your back to your side while in a flat bed without using bedrails?: A Little Help needed moving from lying on your back to sitting on the side of a flat bed without using bedrails?: A Little Help needed moving to and from a bed to a chair (including a wheelchair)?: A Little Help needed standing up from a chair using your arms (e.g., wheelchair or bedside chair)?: A Little Help needed to walk in hospital room?: A Little Help needed climbing 3-5 steps with a railing? : A Lot 6 Click Score: 17    End of Session   Activity Tolerance: Patient limited by fatigue;Patient tolerated treatment well Patient left: in chair;with call bell/phone within reach;with chair alarm set Nurse Communication: Mobility status PT Visit Diagnosis: Other abnormalities of gait and mobility (R26.89);Difficulty in walking, not elsewhere classified (R26.2);Pain     Time: 1550-1611 PT Time Calculation (min) (ACUTE ONLY): 21 min  Charges:    $Therapeutic Activity: 8-22 mins PT General Charges $$ ACUTE PT VISIT: 1 Visit                     Marye Round, PT DPT Acute Rehabilitation Services Secure Chat Preferred  Office [phone]    Anjolaoluwa Siguenza Sheliah Plane 12/06/2023, 4:29 PM

## 2023-12-06 NOTE — TOC Progression Note (Signed)
Transition of Care [facility]) - Progression Note    Patient Details  Name: Christine Morse MRN: [id number] Date of Birth: Apr 25, 1936  Transition of [facility]) CM/SW Contact  Eduard Roux, [place] Phone Number: 12/06/2023, 3:53 PM  Clinical Narrative:     CSW  spoke with patient's daughter- informed bed offers will be placed in the room with medicare.gov website information to review ratings.   TOC will continue to follow and assist with discharge planning.  Antony Blackbird, MSW, LCSW Clinical Social Worker      Barriers to Discharge: Continued Medical Work up  Expected Discharge Plan and Services In-house Referral: Clinical Social Work   Post Acute Care Choice: Skilled Nursing Facility Living arrangements for the past 2 months: Single Family Home                                       Social Determinants of Health (SDOH) Interventions SDOH Screenings   Food Insecurity: No Food Insecurity (11/23/2023)  Housing: Patient Declined (11/23/2023)  Transportation Needs: No Transportation Needs (11/23/2023)  Utilities: Not At Risk (11/23/2023)  Tobacco Use: Low Risk  (11/23/2023)  Recent Concern: Tobacco Use - Medium Risk (11/07/2023)    Readmission Risk Interventions    10/27/2023    9:03 AM  Readmission Risk Prevention Plan  Transportation Screening Complete  PCP or Specialist Appt within 5-7 Days Complete  Home Care Screening Complete  Medication Review (RN CM) Complete

## 2023-12-06 NOTE — Progress Notes (Signed)
PHARMACY - TOTAL PARENTERAL NUTRITION CONSULT NOTE  Indication:  delayed gastric emptying  Patient Measurements: Height: 4\' 10"  (147.3 cm) Weight: 57.9 kg (127 lb 10.3 oz) IBW/kg (Calculated) : 40.9 TPN AdjBW (KG): 44.5 Body mass index is 26.68 kg/m. 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  Patient was started on a CLD on 12/6-12/9 and then advanced to a soft diet since 12/10.  Patient has had minimal PO intake since being post-op and continues to experience nausea. Delayed gastric emptying likely related to possible pancreatic leak.  Pharmacy consulted to dose TPN.  Glucose / Insulin: A1c 3.7 (11/15/23) - CBGs < 180 Used 4 units SSI in the past 24 hrs Electrolytes: K: 4.3 ( 12/14), Na: 143, CoCa: 9.9, all labs WNL  Renal: SCr < 1, BUN WNL Hepatic: LFTs / tbili WNL, albumin 2.3 Intake / Output; MIVF: UOP not charted (2 occurrences, last Lasix 12/12), NG inserted 12/14 with output, drain , LBM 12/15 (s/p Reglan x3 doses 12/11-12/12, restarted 12/15) GI Imaging: 12/07 Ab XR: unremarkable bowel gas pattern. 12/11 CT: possible post-op seroma or hematoma vs abscess, distal colonic diverticulosis 12/15 Ab XR: expected post-op changes  GI Surgeries / Procedures:  12/3 diagnostic laparoscopy, Whipple procedure, pancreatic duct stent 12/13 subhepatic fluid collection aspiration and drainage  Central access: PICC placed 12/01/23 TPN start date: 12/02/23  Nutritional Goals: Clinimix E 8/10 1L at 42 ml/hr on Sun/Tues/Wed/Thurs/Fri/Sat Clinimix E 8/10 2L at 83 ml/hr on Mon SMOF lipids daily  Provides 100% AA goal and ~95% of kCal goal  Electrolytes in Clinimix E 1L bag: Na , K , Mag , Ca 4.80mEq, Phos , Ac , Cl  Electrolytes in Clnimix E 8/10 2L bag: Na 70 mEq, K 60 mEq, Mag 10 mEq, Ca 9 mEq,  Phos 30 mmol, Acetate 166 , Cl 152 mEq   RD Estimated Needs Total Energy Estimated Needs: 1400-1700 kcal/d Total Protein Estimated Needs: 65-80g/d Total Fluid Estimated Needs: 1.5-1.7L/d  Current Nutrition:  TPN NPO Ensure Enlive TID - held given patient NPO   Plan:  Clinimix E 8/10 1L at 42 ml/hr on Sun/Tues/Wed/Thurs/Fri/Sat Clinimix E 8/10 2L at 83 ml/hr on Mon only SMOFlipid daily  Provides a weekly average of 1257 kCal and 92g AA per day, meeting 90% of minimal kCal and 115% of protein needs PO multivitamin daily (no MVI and trace elements in TPN) Continue sensitive SSI to TID  Monitor TPN labs on Mon/Thurs F/U ability to advance diet back from NPO   Estill Batten, PharmD, BCCCP  12/06/2023, 7:55 AM

## 2023-12-06 NOTE — Progress Notes (Signed)
Pt with RUE non-pitting edema. Sensation intact per pt and skin color appropriate for ethnicity. Pt able to move extremity. Rt Radial pulse +2. On call MD notified. U/S to RUE received.

## 2023-12-06 NOTE — Progress Notes (Signed)
Rounding Note    Patient Name: Christine Morse Date of Encounter: 12/06/2023  [facility] Cardiologist: Rollene Rotunda, MD   Subjective   Pt 13 days post op    No SOB   No CP   Feels "so/so"  Inpatient Medications    Scheduled Meds:  Chlorhexidine Gluconate Cloth  6 each Topical Q0600   enoxaparin (LOVENOX) injection  40 mg Subcutaneous Q24H   feeding supplement  237 mL Oral TID BM   insulin aspart  0-9 Units Subcutaneous TID AC   labetalol  20 mg Intravenous Q4H   metoCLOPramide (REGLAN) injection  5 mg Intravenous Q8H   multivitamin with minerals  1 tablet Oral Daily   pantoprazole (PROTONIX) IV  40 mg Intravenous Q12H   sodium chloride flush  10-40 mL Intracatheter Q12H   sodium chloride flush  5 mL Intracatheter Q8H   Continuous Infusions:  TPN (CLINIMIX-E) Adult     And   fat emul(SMOFlipid)     fluconazole (DIFLUCAN) IV Stopped (12/05/23 1246)   piperacillin-tazobactam (ZOSYN)  IV 3.375 g (12/06/23 0845)   TPN (CLINIMIX-E) Adult 42 mL/hr at 12/05/23 1839   PRN Meds: acetaminophen, fentaNYL (SUBLIMAZE) injection, guaiFENesin-dextromethorphan, haloperidol lactate, hydrALAZINE, ipratropium-albuterol, labetalol, methocarbamol (ROBAXIN) injection, ondansetron (ZOFRAN) IV, mouth rinse, prochlorperazine **OR** prochlorperazine, sodium chloride flush   Vital Signs    Vitals:   12/05/23 2308 12/06/23 0330 12/06/23 0732 12/06/23 1110  BP:  (!) 146/57 (!) 137/56 (!) 142/65  Pulse:  78 76 78  Resp:  18 18 20   Temp: 98 F (36.7 C) 98.3 F (36.8 C) 97.8 F (36.6 C) 98 F (36.7 C)  TempSrc: Axillary Oral Axillary Axillary  SpO2:  97% 95% 99%  Weight:  57.9 kg    Height:        Intake/Output Summary (Last 24 hours) at 12/06/2023 1111 Last data filed at 12/06/2023 0847 Gross per 24 hour  Intake 1464.56 ml  Output 1247 ml  Net 217.56 ml      12/06/2023    3:30 AM 12/05/2023    5:00 AM 12/04/2023    5:00 AM  Last 3 Weights  Weight (lbs) 127 lb  10.3 oz 134 lb 6.3 oz 134 lb 6.3 oz  Weight (kg) 57.9 kg 60.96 kg 60.96 kg      Telemetry    SR  - Personally Reviewed  ECG     - Personally Reviewed  Physical Exam   GEN: Very thin 87 yo in no acute distress.   Neck: No JVD Cardiac: RRR,Gr III/VI systolic murmur base t oapex    Respiratory: Clear to auscultation bilaterally.anteriorly  GI: Soft, nontender, non-distended  MS: No edema; No deformity. Neuro:  Nonfocal  Psych: Normal affect   Labs    High Sensitivity Troponin:  No results for input(s): "TROPONINIHS" in the last 720 hours.   Chemistry Recent Labs  Lab 12/02/23 0500 12/03/23 0544 12/04/23 0329 12/06/23 0537  NA 141 140 136 139  K 3.1* 3.9 3.7 4.3  CL 105 108 109 109  CO2 27 27 23 24   GLUCOSE 163* 147* 155* 134*  BUN 24* 18 20 23   CREATININE 0.47 0.75 0.48 0.50  CALCIUM 8.5* 8.7* 8.3* 8.5*  MG 2.1 2.1 2.1 2.1  PROT 4.6* 4.7*  --  4.9*  ALBUMIN 2.6* 2.6*  --  2.3*  AST 17 16  --  19  ALT 15 14  --  23  ALKPHOS 45 50  --  87  BILITOT  0.6 0.7  --  0.6  GFRNONAA >60 >60 >60 >60  ANIONGAP 9 5 4* 6    Lipids  Recent Labs  Lab 12/06/23 0537  TRIG 103    Hematology Recent Labs  Lab 12/01/23 0454 12/02/23 0500 12/03/23 0544  WBC 18.3* 17.5* 19.2*  RBC 3.18* 2.98* 3.27*  HGB 9.8* 9.1* 10.0*  HCT 30.0* 28.9* 32.0*  MCV 94.3 97.0 97.9  MCH 30.8 30.5 30.6  MCHC 32.7 31.5 31.3  RDW 15.0 15.1 15.0  PLT 323 427* 478*   Thyroid No results for input(s): "TSH", "FREET4" in the last 168 hours.  BNPNo results for input(s): "BNP", "PROBNP" in the last 168 hours.  DDimer No results for input(s): "DDIMER" in the last 168 hours.   Radiology    DG Abdomen 1 View Result Date: 12/05/2023 CLINICAL DATA:  87 year old female status post nasogastric tube placement. EXAM: ABDOMEN - 1 VIEW COMPARISON:  Abdominal radiograph 12/05/2023. FINDINGS: Nasogastric tube tip is in the antral pre-pyloric region of the stomach. Midline surgical staples. Surgical drain  projecting over the abdomen with tip projecting over the right iliac crest. Small bore drainage catheter with pigtail reformed over the right upper quadrant of the abdomen. Paucity of bowel gas. IMPRESSION: 1. Support apparatus and postoperative changes, as above. Electronically Signed   By: Trudie Reed M.D.   On: 12/05/2023 08:03   DG Abdomen 1 View Result Date: 12/05/2023 CLINICAL DATA:  NG tube EXAM: ABDOMEN - 1 VIEW COMPARISON:  Abdominal x-ray 11/27/2023 FINDINGS: Nasogastric tube tip is at the level of the mid stomach. Midline skin staples and surgical drain are seen in the mid abdomen. Bowel-gas pattern is nonobstructive. IMPRESSION: Nasogastric tube tip is at the level of the mid stomach. Electronically Signed   By: Darliss Cheney M.D.   On: 12/05/2023 01:13    Cardiac Studies   Echo  10/24/23    1. Proximal septal thickening with SAM and narrow LVOT; turbulence noted  with peak velocity 3.3 m/s (obstructive physiology). Previous study from  08/13/22 reviewed and appears similar (peak LVOT gradient 5.3 m/s;  obstruction appears to be at LVOT level  and not aortic stenosis as reported).   2. Left ventricular ejection fraction, by estimation, is 60 to 65%. The  left ventricle has normal function. The left ventricle has no regional  wall motion abnormalities. There is mild concentric left ventricular  hypertrophy. Left ventricular diastolic  parameters are consistent with Grade I diastolic dysfunction (impaired  relaxation). Elevated left atrial pressure.   3. Right ventricular systolic function is normal. The right ventricular  size is normal. There is mildly elevated pulmonary artery systolic  pressure.   4. Left atrial size was severely dilated.   5. The mitral valve is normal in structure. Moderate mitral valve  regurgitation. No evidence of mitral stenosis.   6. The aortic valve is calcified. Aortic valve regurgitation is trivial.  No aortic stenosis is present.   7. The  inferior vena cava is normal in size with greater than 50%  respiratory variability, suggesting right atrial pressure of 3 mmHg.    Patient Profile     87 y.o. female   Assessment & Plan    1  HOCM    PT with vigorous LV function   SOme dynamic obstruction   Follow BP and exam      Adjust meds (IV) as needed   2  Mitral regurgitation  Moderate on echo   Will need to be follow  3  HTN  BP is fair     Follow   4  HL  Was on Crestor as outpt   Held for now      For questions or updates, please contact [facility] Please consult [email or web address] for contact info under        Signed, Dietrich Pates, MD  12/06/2023, 11:11 AM

## 2023-12-06 NOTE — Progress Notes (Signed)
13 Days Post-Op   Subjective/Chief Complaint: Did better after NGT placed.  Less confused.  Has been ambulating.    Objective: Vital signs in last 24 hours: Temp:  [97.7 F (36.5 C)-98.3 F (36.8 C)] 97.8 F (36.6 C) (12/16 0732) Pulse Rate:  [76-82] 76 (12/16 0732) Resp:  [13-22] 18 (12/16 0732) BP: (137-150)/(56-63) 137/56 (12/16 0732) SpO2:  [95 %-99 %] 95 % (12/16 0732) Weight:  [57.9 kg] 57.9 kg (12/16 0330) Last BM Date : 12/05/23  Intake/Output from previous day: 12/15 0701 - 12/16 0700 In: 1454.6 [I.V.:1126.7; IV Piggyback:327.9] Out: 1247 [Emesis/NG output:1125; Drains:122] Intake/Output this shift: No intake/output data recorded.  General appearance: alert, cooperative.  Resp: breathing comfortably on [place].  GI: soft, non distended, not significantly tender. Drains left remains bilious and murky. Right murky brown.  Incision without erythema or drainage.  Perc drain serous and transparent.  Extremities: extremities normal, atraumatic, no cyanosis or edema  Lab Results:  No results for input(s): "WBC", "HGB", "HCT", "PLT" in the last 72 hours.  BMET Recent Labs    12/04/23 0329 12/06/23 0537  NA 136 139  K 3.7 4.3  CL 109 109  CO2 23 24  GLUCOSE 155* 134*  BUN 20 23  CREATININE 0.48 0.50  CALCIUM 8.3* 8.5*   PT/INR No results for input(s): "LABPROT", "INR" in the last 72 hours.   ABG No results for input(s): "PHART", "HCO3" in the last 72 hours.  Invalid input(s): "PCO2", "PO2"   Studies/Results: DG Abdomen 1 View Result Date: 12/05/2023 CLINICAL DATA:  87 year old female status post nasogastric tube placement. EXAM: ABDOMEN - 1 VIEW COMPARISON:  Abdominal radiograph 12/05/2023. FINDINGS: Nasogastric tube tip is in the antral pre-pyloric region of the stomach. Midline surgical staples. Surgical drain projecting over the abdomen with tip projecting over the right iliac crest. Small bore drainage catheter with pigtail reformed over the right upper  quadrant of the abdomen. Paucity of bowel gas. IMPRESSION: 1. Support apparatus and postoperative changes, as above. Electronically Signed   By: Trudie Reed M.D.   On: 12/05/2023 08:03   DG Abdomen 1 View Result Date: 12/05/2023 CLINICAL DATA:  NG tube EXAM: ABDOMEN - 1 VIEW COMPARISON:  Abdominal x-ray 11/27/2023 FINDINGS: Nasogastric tube tip is at the level of the mid stomach. Midline skin staples and surgical drain are seen in the mid abdomen. Bowel-gas pattern is nonobstructive. IMPRESSION: Nasogastric tube tip is at the level of the mid stomach. Electronically Signed   By: Darliss Cheney M.D.   On: 12/05/2023 01:13    Anti-infectives: Anti-infectives (From admission, onward)    Start     Dose/Rate Route Frequency Ordered Stop   11/30/23 1230  fluconazole (DIFLUCAN) IVPB 400 mg        400 mg 100 mL/hr over 120 Minutes Intravenous Every 24 hours 11/30/23 1135     11/26/23 0945  piperacillin-tazobactam (ZOSYN) IVPB 3.375 g        3.375 g 12.5 mL/hr over 240 Minutes Intravenous Every 8 hours 11/26/23 0853     11/23/23 2100  ceFAZolin (ANCEF) IVPB 2g/100 mL premix        2 g 200 mL/hr over 30 Minutes Intravenous Every 8 hours 11/23/23 1654 11/23/23 2214   11/23/23 0700  ceFAZolin (ANCEF) IVPB 2g/100 mL premix        2 g 200 mL/hr over 30 Minutes Intravenous On call to O.R. 11/23/23 0654 11/23/23 1249       Assessment/Plan: s/p Procedure(s) with comments: WHIPPLE PROCEDURE (N/A)  11/23/23 POD 13   Final pathology - adenocarcinoma duodenum, positive margin, positive LN - pT3pN2cM0, Dr. Donell Beers discussed with family last week.   HOCM - continue beta blockade and treat hypertension.   Anemia of chronic disease, chronic blood loss anemia, and acute blood loss anemia - stable.  Hyperglycemia - continue to check fingersticks.   Hypophosphatemia - currently normal  Hypokalemia - now normal Severe protein calorie malnutrition - TNA, nutrition consult.   Pancreatic leak - Continue  Zosyn/Fluconazole given continued leukocytosis.  Delayed gastric emptying. Can do some reglan, but not indefinitely due to QTc.    CT aspiration and drain placement 12/13 (anterior hepatic fluid collection) - serosang. Gram stain - no organisms; NGTD  Pain control with prn tylenol, prn robaxin, prn fentanyl.  OOB, IS, PT consult.  Continue lovenox.   SW consult.    LOS: 13 days   Maudry Diego, MD, FACS, FSSO Surgical Oncology, General Surgery, [facility], [place] [phone] for weekday/non holidays Check [email or web address] for coverage night/weekend/holidays

## 2023-12-07 ENCOUNTER — Encounter (HOSPITAL_COMMUNITY): Payer: Self-pay | Admitting: General Surgery

## 2023-12-07 ENCOUNTER — Inpatient Hospital Stay (HOSPITAL_COMMUNITY): Payer: Medicare Other

## 2023-12-07 DIAGNOSIS — D132 Benign neoplasm of duodenum: Secondary | ICD-10-CM | POA: Diagnosis not present

## 2023-12-07 DIAGNOSIS — M7989 Other specified soft tissue disorders: Secondary | ICD-10-CM | POA: Diagnosis not present

## 2023-12-07 LAB — CBC
HCT: 31.2 % — ABNORMAL LOW (ref 36.0–46.0)
Hemoglobin: 9.6 g/dL — ABNORMAL LOW (ref 12.0–15.0)
MCH: 30.5 pg (ref 26.0–34.0)
MCHC: 30.8 g/dL (ref 30.0–36.0)
MCV: 99 fL (ref 80.0–100.0)
Platelets: 424 10*3/uL — ABNORMAL HIGH (ref 150–400)
RBC: 3.15 MIL/uL — ABNORMAL LOW (ref 3.87–5.11)
RDW: 15.5 % (ref 11.5–15.5)
WBC: 14.5 10*3/uL — ABNORMAL HIGH (ref 4.0–10.5)
nRBC: 0 % (ref 0.0–0.2)

## 2023-12-07 LAB — GLUCOSE, CAPILLARY
Glucose-Capillary: 135 mg/dL — ABNORMAL HIGH (ref 70–99)
Glucose-Capillary: 151 mg/dL — ABNORMAL HIGH (ref 70–99)
Glucose-Capillary: 113 mg/dL — ABNORMAL HIGH (ref 70–99)
Glucose-Capillary: 104 mg/dL — ABNORMAL HIGH (ref 70–99)

## 2023-12-07 MED ORDER — ENOXAPARIN SODIUM 60 MG/0.6ML IJ SOSY
60.0000 mg | PREFILLED_SYRINGE | Freq: Two times a day (BID) | INTRAMUSCULAR | Status: DC
  Administered 2023-12-07 – 2023-12-23 (×31): 60 mg via SUBCUTANEOUS
  Filled 2023-12-07 (×33): qty 0.6

## 2023-12-07 MED ORDER — METOPROLOL TARTRATE 5 MG/5ML IV SOLN
5.0000 mg | Freq: Four times a day (QID) | INTRAVENOUS | Status: DC | PRN
  Administered 2023-12-07: 5 mg via INTRAVENOUS
  Filled 2023-12-07: qty 5

## 2023-12-07 MED ORDER — CLINIMIX E/DEXTROSE (8/10) 8 % IV SOLN
INTRAVENOUS | Status: AC
  Filled 2023-12-07 (×2): qty 1000

## 2023-12-07 MED ORDER — ALTEPLASE 2 MG IJ SOLR
2.0000 mg | Freq: Once | INTRAMUSCULAR | Status: AC
  Administered 2023-12-07: 2 mg
  Filled 2023-12-07: qty 2

## 2023-12-07 MED ORDER — FAT EMUL FISH OIL/PLANT BASED 20% (SMOFLIPID)IV EMUL
250.0000 mL | INTRAVENOUS | Status: AC
  Administered 2023-12-07: 250 mL via INTRAVENOUS
  Filled 2023-12-07: qty 250

## 2023-12-07 NOTE — Progress Notes (Addendum)
RUE with non-pitting edema.  PICC line flushed with no issues. No blood return noted from PICC line. IV team consulted. Vascular u/s completed per MD order. Per vascular ultrasound technician, multiple small blood clots around PICC Line. IV team notified. Trixie Deis, PA notified.

## 2023-12-07 NOTE — Progress Notes (Signed)
RN received call back from [place], [place] and told RN she will notify Dr. Donell Beers of results from vascular u/s. No new orders received. IV team consulted.

## 2023-12-07 NOTE — Plan of Care (Signed)
Problem: Education: Goal: Knowledge of General Education information will improve Description Including pain rating scale, medication(s)/side effects and non-pharmacologic comfort measures Outcome: Progressing   Problem: Clinical Measurements: Goal: Respiratory complications will improve Outcome: Progressing   Problem: Activity: Goal: Risk for activity intolerance will decrease Outcome: Progressing   Problem: Coping: Goal: Level of anxiety will decrease Outcome: Progressing   Problem: Elimination: Goal: Will not experience complications related to urinary retention Outcome: Progressing   Problem: Safety: Goal: Ability to remain free from injury will improve Outcome: Progressing

## 2023-12-07 NOTE — Progress Notes (Signed)
IVT consult placed to assess PICC. Patient has double lumen PICC; TPN infusing through purple lumen, IV abx infusing through red lumen. Red lumen flushes with no difficulty but unable to get blood return. Cathflo ordered. Pharmacy contacted to double check timing of medications. Primary RN instructed to infuse medications as ordered and place new IVT consult once medications complete as cathflo will need to instill for 2 hours.   Randal Goens Loyola Mast, RN

## 2023-12-07 NOTE — Progress Notes (Signed)
PHARMACY - TOTAL PARENTERAL NUTRITION CONSULT NOTE  Indication:  delayed gastric emptying  Patient Measurements: Height: 4\' 10"  (147.3 cm) Weight: 57.9 kg (127 lb 10.3 oz) IBW/kg (Calculated) : 40.9 TPN AdjBW (KG): 44.5 Body mass index is 26.68 kg/m. 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  Patient was started on a CLD on 12/6-12/9 and then advanced to a soft diet since 12/10.  Patient has had minimal PO intake since being post-op and continues to experience nausea. Delayed gastric emptying likely related to possible pancreatic leak.  Pharmacy consulted to dose TPN.  Glucose / Insulin: A1c 3.7 (11/15/23) - CBGs < 180 Used 1 units SSI in the past 24 hrs Electrolytes: K: 4.3 ( 12/14), Na: 143, CoCa: 9.9, all labs WNL  Renal: SCr < 1, BUN WNL Hepatic: LFTs / tbili WNL, albumin 2.3 Intake / Output; MIVF: UOP not charted (2 occurrences, last Lasix 12/12), NG inserted 12/14 with 700 mL output, drain 90 mL, LBM 12/15 (s/p Reglan x3 doses 12/11-12/12, restarted 12/15); GI Imaging: 12/07 Ab XR: unremarkable bowel gas pattern. 12/11 CT: possible post-op seroma or hematoma vs abscess, distal colonic diverticulosis 12/15 Ab XR: expected post-op changes  GI Surgeries / Procedures:  12/3 diagnostic laparoscopy, Whipple procedure, pancreatic duct stent 12/13 subhepatic fluid collection aspiration and drainage  Central access: PICC placed 12/01/23 TPN start date: 12/02/23  Nutritional Goals: Clinimix E 8/10 1L at 42 ml/hr on Sun/Tues/Wed/Thurs/Fri/Sat Clinimix E 8/10 2L at 83 ml/hr on Mon SMOF lipids daily  Provides 100% AA goal and ~95% of kCal goal  Electrolytes in Clinimix E 1L bag: Na , K , Mag , Ca 4.22mEq, Phos , Ac , Cl  Electrolytes in Clnimix E 8/10 2L bag: Na 70 mEq, K 60 mEq, Mag 10 mEq, Ca 9 mEq,  Phos 30 mmol, Acetate 166 , Cl 152 mEq   RD Estimated Needs Total Energy Estimated Needs: 1400-1700 kcal/d Total Protein Estimated Needs: 65-80g/d Total Fluid Estimated Needs: 1.5-1.7L/d  Current Nutrition:  TPN NPO Ensure Enlive TID - held given patient NPO   Plan:  Clinimix E 8/10 1L at 42 ml/hr on Sun/Tues/Wed/Thurs/Fri/Sat Clinimix E 8/10 2L at 83 ml/hr on Mon only SMOFlipid daily  Provides a weekly average of 1257 kCal and 92g AA per day, meeting 90% of minimal kCal and 115% of protein needs PO multivitamin daily (no MVI and trace elements in TPN) Continue sensitive SSI to TID  Monitor TPN labs on Mon/Thurs F/U ability to advance diet back from NPO    Megumi Treaster BS, PharmD, BCPS Clinical Pharmacist 12/07/2023 7:42 AM  Contact: [phone] after 3 PM  "Be curious, not judgmental..." -Debbora Dus

## 2023-12-07 NOTE — Progress Notes (Signed)
Nutrition Follow-up  DOCUMENTATION CODES:   Severe malnutrition in context of acute illness/injury  INTERVENTION:   Diet advancement per MD  MVI with minerals PO daily TPN per pharmacy   NUTRITION DIAGNOSIS:   Severe Malnutrition (in the context of acute illness) related to  (inadequate energy intake) as evidenced by energy intake < or equal to 50% for > or equal to 5 days, moderate fat depletion, moderate muscle depletion.  - Still applicable   GOAL:   Patient will meet greater than or equal to 90% of their needs  - Meeting via TPN  MONITOR:   PO intake, Supplement acceptance, Labs, Weight trends  REASON FOR ASSESSMENT:   Consult Assessment of nutrition requirement/status  ASSESSMENT:   Pt with hx of HTN, HLD, GERD, and diverticulosis presented for planned surgery after a large duodenal mass was seen on imaging at admission in November and was presumed to be malignant. Underwent pancreaticoduodenectomy and placement of pancreatic duct stent.  12/3 - Op, pancreaticoduodenectomy and placement of pancreatic duct stent.  12/10 - Soft diet  12/11 CT: possible post-op seroma or hematoma vs abscess, distal colonic diverticulosis  12/12 - TPN started,  12/13 - CT aspiration and drain placement, NPO 12/15 - NG inserted for persistent emesis   TPN continuing. Pt was still eating poorly when placed on soft diet 12/13. Pt diagnosed with pancreatic leak, still having delayed gastric emptying and was having persistent emesis which required an NG be placed with significant output on 12/15. Placed NPO. She is on reglan Q8.  RN reports pt still having high output NG.   TPN: Clinimix E 8/10 1L at 42 ml/hr on Sun/Tues/Wed/Thurs/Fri/Sat Clinimix E 8/10 2L at 83 ml/hr on Mon only SMOFlipid daily   Provides a weekly average of 1257 kCal and 92g AA per day, meeting 90% of minimal kCal and 115% of protein needs  Admit weight: 55.3 kg - stated?  Current weight: 57.9 kg    Admit  weight: 55.3 - stated?  Current weight: 57.9 kg    Intake/Output Summary (Last 24 hours) at 12/07/2023 0906 Last data filed at 12/07/2023 0819 Gross per 24 hour  Intake 10 ml  Output 790 ml  Net -780 ml   Net IO Since Admission: 1,266.66 mL [12/07/23 0906]  Drains/Lines: NG: 1125 ml x 24 hours Closed system drain 1 L LLQ: 30 ml x 24 hours Closed system drain 1 R RLQ: 80 ml x 24 hours  R Abdomen bulb drain: 12 ml x 24 hours   Average Meal Intake: 12/13: 10% intake x 1 recorded meals  Nutritionally Relevant Medications: Scheduled Meds:  Chlorhexidine Gluconate Cloth  6 each Topical Q0600   enoxaparin (LOVENOX) injection  40 mg Subcutaneous Q24H   feeding supplement  237 mL Oral TID BM   insulin aspart  0-9 Units Subcutaneous TID AC   labetalol  20 mg Intravenous Q4H   metoCLOPramide (REGLAN) injection  5 mg Intravenous Q8H   multivitamin with minerals  1 tablet Oral Daily   pantoprazole (PROTONIX) IV  40 mg Intravenous Q12H   sodium chloride flush  10-40 mL Intracatheter Q12H   sodium chloride flush  5 mL Intracatheter Q8H   Continuous Infusions:  TPN (CLINIMIX-E) Adult     And   fat emul(SMOFlipid)     fluconazole (DIFLUCAN) IV 400 mg (12/06/23 1244)   piperacillin-tazobactam (ZOSYN)  IV 3.375 g (12/07/23 0817)   TPN (CLINIMIX-E) Adult 83 mL/hr at 12/06/23 1749   Labs Reviewed: Calcium 8.5  CBG ranges from 115-157 mg/dL over the last 24 hours    Diet Order:   Diet Order             Diet NPO time specified  Diet effective now                   EDUCATION NEEDS:   Education needs have been addressed  Skin:  Skin Assessment: Skin Integrity Issues: Skin Integrity Issues:: Incisions Incisions: Abdomen  Last BM:  12/15, type 6  Height:   Ht Readings from Last 1 Encounters:  11/23/23 4\' 10"  (1.473 m)    Weight:   Wt Readings from Last 1 Encounters:  12/06/23 57.9 kg    Ideal Body Weight:  43.9 kg  BMI:  Body mass index is 26.68  kg/m.  Estimated Nutritional Needs:   Kcal:  1400-1700 kcal/d  Protein:  65-80g/d  Fluid:  1.5-1.7L/d   Elliot Dally, RD Registered Dietitian  See Amion for more information

## 2023-12-07 NOTE — Progress Notes (Signed)
Upper extremity venous duplex completed. Please see CV Procedures for preliminary results.  Initial findings reported to Lenard Forth, RN.  Shona Simpson, RVT 12/07/23 12:28 PM

## 2023-12-07 NOTE — Progress Notes (Signed)
PHARMACY - ANTICOAGULATION CONSULT NOTE  Pharmacy Consult for enoxaparin  Indication: DVT  Allergies  Allergen Reactions   Codeine     Patient Measurements: Height: 4\' 10"  (147.3 cm) Weight: 57.9 kg (127 lb 10.3 oz) IBW/kg (Calculated) : 40.9 Heparin Dosing Weight: 52.4 kg  Vital Signs: Temp: 96.7 F (35.9 C) (12/17 1121) Temp Source: Axillary (12/17 1121) BP: 145/59 (12/17 1121) Pulse Rate: 76 (12/17 1121)  Labs: Recent Labs    12/06/23 0537 12/07/23 1142  HGB  --  9.6*  HCT  --  31.2*  PLT  --  424*  CREATININE 0.50  --     Estimated Creatinine Clearance: 38.7 mL/min (by C-G formula based on SCr of 0.5 mg/dL).   Medical History: Past Medical History:  Diagnosis Date   Anemia    Arthritis    Cyst of ovary, right 2013   "/US" (10/04/2013)   Diverticulitis    Diverticulosis    Duodenal adenoma    GERD (gastroesophageal reflux disease)    History of blood transfusion    "just today; my HgB is 5.1" (10/04/2013)   History of gastric ulcer    History of hiatal hernia    HOCM (hypertrophic obstructive cardiomyopathy) (HCC) 10/24/2023   Hyperlipidemia    Hypertension    Mitral regurgitation    moderate MR 10/24/23   Nephrolithiasis    "passed them on my own; went away after I quit drinking tea" (10/04/2013)   Osteoporosis    Vitamin D deficiency     Assessment: 87 yo F with new acute superficial vein thrombosis and acute thrombosis surrounding PICC line. Pharmacy consulted for enoxaparin for treatment. Pt is NPO on TPN.   Hgb 9.6, Plt 424  Goal of Therapy:  Monitor platelets by anticoagulation protocol: Yes   Plan:  D/c DVT prophylaxis enoxaparin (40mg ) received this AM Enoxaparin 60 mg (1mg /kg) q12h F/u CBC, s/sx bleeding F/u renal function closely   Calton Dach, PharmD, BCCCP Clinical Pharmacist 12/07/2023 1:50 PM

## 2023-12-07 NOTE — Progress Notes (Signed)
14 Days Post-Op   Subjective/Chief Complaint: Still feels "really bad."  Had some right arm swelling last night.    Objective: Vital signs in last 24 hours: Temp:  [97.1 F (36.2 C)-98.9 F (37.2 C)] 97.6 F (36.4 C) (12/17 0804) Pulse Rate:  [73-79] 78 (12/17 0804) Resp:  [17-26] 26 (12/17 0804) BP: (142-184)/(61-68) 184/63 (12/17 0804) SpO2:  [97 %-99 %] 97 % (12/17 0804) Last BM Date : 12/05/23  Intake/Output from previous day: 12/16 0701 - 12/17 0700 In: 10 [I.V.:10] Out: 790 [Emesis/NG output:700; Drains:90] Intake/Output this shift: Total I/O In: 10 [I.V.:10] Out: -   General appearance: alert, cooperative. NGt with bilious output. Air flushed in blue port.  Resp: breathing comfortably on [place].  GI: soft, non distended, not significantly tender. Drains left remains bilious and murky. Right murky brown.  Incision without erythema or drainage.  Perc drain serous and transparent.  Extremities: extremities normal, atraumatic, no cyanosis or edema  Lab Results:  No results for input(s): "WBC", "HGB", "HCT", "PLT" in the last 72 hours.  BMET Recent Labs    12/06/23 0537  NA 139  K 4.3  CL 109  CO2 24  GLUCOSE 134*  BUN 23  CREATININE 0.50  CALCIUM 8.5*   PT/INR No results for input(s): "LABPROT", "INR" in the last 72 hours.   ABG No results for input(s): "PHART", "HCO3" in the last 72 hours.  Invalid input(s): "PCO2", "PO2"   Studies/Results: No results found.   Anti-infectives: Anti-infectives (From admission, onward)    Start     Dose/Rate Route Frequency Ordered Stop   11/30/23 1230  fluconazole (DIFLUCAN) IVPB 400 mg        400 mg 100 mL/hr over 120 Minutes Intravenous Every 24 hours 11/30/23 1135     11/26/23 0945  piperacillin-tazobactam (ZOSYN) IVPB 3.375 g        3.375 g 12.5 mL/hr over 240 Minutes Intravenous Every 8 hours 11/26/23 0853     11/23/23 2100  ceFAZolin (ANCEF) IVPB 2g/100 mL premix        2 g 200 mL/hr over 30 Minutes  Intravenous Every 8 hours 11/23/23 1654 11/23/23 2214   11/23/23 0700  ceFAZolin (ANCEF) IVPB 2g/100 mL premix        2 g 200 mL/hr over 30 Minutes Intravenous On call to O.R. 11/23/23 0654 11/23/23 1249       Assessment/Plan: s/p Procedure(s) with comments: WHIPPLE PROCEDURE (N/A) 11/23/23 POD 14   Final pathology - adenocarcinoma duodenum, positive margin, positive LN - pT3pN2cM0,   HOCM - continue beta blockade and treat hypertension.   Anemia of chronic disease, chronic blood loss anemia, and acute blood loss anemia - stable. Recheck today.  Hyperglycemia - continue to check fingersticks.   Hypophosphatemia - recheck thursday Hypokalemia - recheck thursday Severe protein calorie malnutrition - TNA, nutrition consult.   Pancreatic leak - Continue Zosyn/Fluconazole given continued leukocytosis. Recheck WBCs today.  Drain output coming down.    Delayed gastric emptying. Can do some reglan, but not indefinitely due to QTc.  Recheck EKG today.    CT aspiration and drain placement 12/13 (anterior hepatic fluid collection) - serosang. Gram stain - no organisms; NGTD  Pain control with prn tylenol, prn robaxin, prn fentanyl.  OOB, IS, PT consult.  Continue lovenox. Upper extremity duplex scheduled today for arm swelling.  SW consult for placement.   LOS: 14 days   Maudry Diego, MD, FACS, FSSO Surgical Oncology, General Surgery, Trauma and Critical Care [facility], [place] [phone] for weekday/non holidays Check [email or web address] for coverage night/weekend/holidays

## 2023-12-07 NOTE — Progress Notes (Addendum)
Rounding Note    Patient Name: Christine Morse Date of Encounter: 12/07/2023  [facility] Cardiologist: Rollene Rotunda, MD   Subjective   On 12/13 Had Surgery w/ specimens:   Liver nodule left lateral segment Cystic liver nodule left lateral segment 3.   Pancreaticoduodenectomy with gallbladder:  4.   Node at ligament of trietz 5.  Mesenteric nodule 6. Proximal jejunum  12/17 Pt denies SOB, no chest pain.  Says tube not coming out today. Says is using Incent Spirometry C/o swelling R arm  (PICC in that arm is clotted, [place] ordered, Nurse to make sure it happens this am, TPA ordered)   Inpatient Medications    Scheduled Meds:  alteplase  2 mg Intracatheter Once   Chlorhexidine Gluconate Cloth  6 each Topical Q0600   enoxaparin (LOVENOX) injection  40 mg Subcutaneous Q24H   feeding supplement  237 mL Oral TID BM   insulin aspart  0-9 Units Subcutaneous TID AC   labetalol  20 mg Intravenous Q4H   metoCLOPramide (REGLAN) injection  5 mg Intravenous Q8H   multivitamin with minerals  1 tablet Oral Daily   pantoprazole (PROTONIX) IV  40 mg Intravenous Q12H   sodium chloride flush  10-40 mL Intracatheter Q12H   sodium chloride flush  5 mL Intracatheter Q8H   Continuous Infusions:  TPN (CLINIMIX-E) Adult     And   fat emul(SMOFlipid)     fluconazole (DIFLUCAN) IV 400 mg (12/06/23 1244)   piperacillin-tazobactam (ZOSYN)  IV 3.375 g (12/07/23 0817)   TPN (CLINIMIX-E) Adult 83 mL/hr at 12/06/23 1749   PRN Meds: acetaminophen, fentaNYL (SUBLIMAZE) injection, guaiFENesin-dextromethorphan, haloperidol lactate, hydrALAZINE, ipratropium-albuterol, labetalol, methocarbamol (ROBAXIN) injection, ondansetron (ZOFRAN) IV, mouth rinse, prochlorperazine **OR** prochlorperazine, sodium chloride flush   Vital Signs    Vitals:   12/06/23 1708 12/06/23 1947 12/06/23 2342 12/07/23 0804  BP: (!) 147/65 (!) 162/62 (!) 162/68 (!) 184/63  Pulse: 77 78 79 78  Resp: 17 20 18  (!) 26   Temp: 97.8 F (36.6 C) 98.9 F (37.2 C) 98.5 F (36.9 C) 97.6 F (36.4 C)  TempSrc: Oral Oral Axillary Oral  SpO2: 99% 97% 98% 97%  Weight:      Height:        Intake/Output Summary (Last 24 hours) at 12/07/2023 1119 Last data filed at 12/07/2023 0819 Gross per 24 hour  Intake 10 ml  Output 790 ml  Net -780 ml      12/06/2023    3:30 AM 12/05/2023    5:00 AM 12/04/2023    5:00 AM  Last 3 Weights  Weight (lbs) 127 lb 10.3 oz 134 lb 6.3 oz 134 lb 6.3 oz  Weight (kg) 57.9 kg 60.96 kg 60.96 kg      Telemetry    SR  - Personally Reviewed  ECG    ECG today w/ HR 83, QT/QTc 366/430 ms previously was 386/491 ms on 11/02 - Personally Reviewed  Physical Exam   GEN: Very thin 87 yo in no acute distress.   Neck: No JVD Cardiac: RRR, III/VI systolic murmur noted   Respiratory:  few rales bases bilaterally GI: Soft, +tender, non-distended  MS: + R arm edema; No deformity. Neuro:  Nonfocal  Psych: Normal affect   Labs    High Sensitivity Troponin:  No results for input(s): "TROPONINIHS" in the last 720 hours.   Chemistry Recent Labs  Lab 12/02/23 0500 12/03/23 0544 12/04/23 0329 12/06/23 0537  NA 141 140 136 139  K 3.1* 3.9 3.7 4.3  CL 105 108 109 109  CO2 27 27 23 24   GLUCOSE 163* 147* 155* 134*  BUN 24* 18 20 23   CREATININE 0.47 0.75 0.48 0.50  CALCIUM 8.5* 8.7* 8.3* 8.5*  MG 2.1 2.1 2.1 2.1  PROT 4.6* 4.7*  --  4.9*  ALBUMIN 2.6* 2.6*  --  2.3*  AST 17 16  --  19  ALT 15 14  --  23  ALKPHOS 45 50  --  87  BILITOT 0.6 0.7  --  0.6  GFRNONAA >60 >60 >60 >60  ANIONGAP 9 5 4* 6    Lipids  Recent Labs  Lab 12/06/23 0537  TRIG 103    Hematology Recent Labs  Lab 12/01/23 0454 12/02/23 0500 12/03/23 0544  WBC 18.3* 17.5* 19.2*  RBC 3.18* 2.98* 3.27*  HGB 9.8* 9.1* 10.0*  HCT 30.0* 28.9* 32.0*  MCV 94.3 97.0 97.9  MCH 30.8 30.5 30.6  MCHC 32.7 31.5 31.3  RDW 15.0 15.1 15.0  PLT 323 427* 478*   Thyroid No results for input(s): "TSH",  "FREET4" in the last 168 hours.  BNPNo results for input(s): "BNP", "PROBNP" in the last 168 hours.  DDimer No results for input(s): "DDIMER" in the last 168 hours.   Radiology    No results found.   Cardiac Studies   Echo  10/24/23    1. Proximal septal thickening with SAM and narrow LVOT; turbulence noted with peak velocity 3.3 m/s (obstructive physiology). Previous study from 08/13/22 reviewed and appears similar (peak LVOT gradient 5.3 m/s; obstruction appears to be at LVOT level and not aortic stenosis as reported).   2. Left ventricular ejection fraction, by estimation, is 60 to 65%. The left ventricle has normal function. The left ventricle has no regional wall motion abnormalities. There is mild concentric left ventricular hypertrophy. Left ventricular diastolic parameters are consistent with Grade I diastolic dysfunction (impaired  relaxation). Elevated left atrial pressure.   3. Right ventricular systolic function is normal. The right ventricular size is normal. There is mildly elevated pulmonary artery systolic pressure.   4. Left atrial size was severely dilated.   5. The mitral valve is normal in structure. Moderate mitral valve  regurgitation. No evidence of mitral stenosis.   6. The aortic valve is calcified. Aortic valve regurgitation is trivial. No aortic stenosis is present.   7. The inferior vena cava is normal in size with greater than 50% respiratory variability, suggesting right atrial pressure of 3 mmHg.    Patient Profile     87 y.o. 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   Admitted 12/02 for surgery listed above, Cards has been following for HOCM.  PICC line inserted 12/17, U/A to get blood return.  Assessment & Plan    1  HOCM   - Echo w/ vigorous LV function  - some dynamic obstruction w/ peak gradient 5.3 m/s - no volume overload by exam - pt BP and HR are elevated, likely  influenced by acute illness - she has prn hydralazine ordered   I have discontinued  Vasodilator not good in setting of HOCM   -  WOuld move to metoprolol over labetalol for BP control as needed    - SBP range 137 - 184, but mostly 140s  Would not treat agressively for now     2  Mitral regurgitation  - moderate MR w/ calcified aortic  valve on echo   - follow as outpt  3  HTN   WOuld switch to metoprolol 5 mg IV  Can increase frequency as needed  Discontinued hydralazine   Stopped labetalol  BP 140s optimal for now as recovers  4  HL   - restart Crestor when taking POs  5. Adenocarcinoma duodenum s/p Whipple - has pancreatic leak - delayed gastric emptying, Dr Donell Beers wants to add Reglan, QT/QTc ok on todays ECG, follow if Reglan used   Otherwise,  per Surgery    For questions or updates, please contact [facility] Please consult [email or web address] for contact info under        Signed, Theodore Demark, PA-C  12/07/2023, 11:19 AM    Pt seen and examined   I have amended note above by R Barrett  S/p surgery Lungs   Relatively clear  Cardiac RRR  II/VI systolic murmur apex Abd   Tender  Ext   No LE  edema    As noted above, IV meds hydralazine and labetalol switched to metoprolol      Avoid preload reduction, pure afterload reduction given HOCM Follow   Dietrich Pates MD

## 2023-12-07 NOTE — Progress Notes (Addendum)
Inpatient Rehabilitation Admissions Coordinator   Received a call from daughter, Meriam Sprague. She and her daughter, Marisue Ivan, will be able to provide 24/7 care once home in [facility] home. They do not want SNF. I explained that I can follow her care to see what functional level she would be at after NGT removal and TNA weaned. . I will alert TOC and  replace rehab consult order. She may need LTACH.  Ottie Glazier, RN, MSN Rehab Admissions Coordinator [phone] 12/07/2023 12:49 PM

## 2023-12-07 NOTE — Progress Notes (Signed)
Occupational Therapy Treatment Patient Details Name: Christine Morse MRN: [id number] DOB: [birth year] Today's Date: 12/07/2023   History of present illness Patient is an 87 y/o female admitted 11/23/23 with diagnosis of duodenal adenoma and underwent laparoscopic Whipple procedure. CT aspiration and drain placement 12/13. NGT inserted 12/15, dislodged then replaced. 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   OT comments  Patient received in supine and eager to get OOB. Patient with mod assist to get to EOB with assistance for trunk and BLEs. Patient required cues for hand placement and min assist to stand and transfer to recliner. Patient performed light grooming in recliner and declined further self care or mobility. Patient will benefit from continued inpatient follow up therapy, <3 hours/day to address bed mobility, functional transfers, and self care. Acute OT to continue to follow.       If plan is discharge home, recommend the following:  A lot of help with bathing/dressing/bathroom;A little help with walking and/or transfers;Assist for transportation;Assistance with cooking/housework;Help with stairs or ramp for entrance   Equipment Recommendations  BSC/3in1    Recommendations for Other Services      Precautions / Restrictions Precautions Precautions: Fall Precaution Comments: x2 blake drains, x1 JP drain; NGT Restrictions Weight Bearing Restrictions Per Provider Order: No       Mobility Bed Mobility Overal bed mobility: Needs Assistance Bed Mobility: Rolling, Sidelying to Sit Rolling: Min assist Sidelying to sit: Mod assist       General bed mobility comments: assistance with trunk and BLEs    Transfers Overall transfer level: Needs assistance Equipment used: Rolling walker (2 wheels) Transfers: Sit to/from Stand, Bed to chair/wheelchair/BSC Sit to Stand: Min assist     Step pivot transfers: Min assist     General transfer  comment: cues for hand placemnt and assistance to stand     Balance Overall balance assessment: Needs assistance Sitting-balance support: Feet supported Sitting balance-[name]: Fair Sitting balance - Comments: EOB   Standing balance support: Bilateral upper extremity supported Standing balance-[name]: Poor Standing balance comment: UE support for balance                           ADL either performed or assessed with clinical judgement   ADL Overall ADL's : Needs assistance/impaired     Grooming: Wash/dry hands;Wash/dry face;Set up Grooming Details (indicate cue type and reason): sitting in recliner             Lower Body Dressing: Moderate assistance;Sit to/from stand Lower Body Dressing Details (indicate cue type and reason): donning slippers Toilet Transfer: Minimal assistance;Rolling walker (2 wheels) Toilet Transfer Details (indicate cue type and reason): simulated to recliner                Extremity/Trunk Assessment              Vision       Perception     Praxis      Cognition Arousal: Alert Behavior During Therapy: WFL for tasks assessed/performed Overall Cognitive Status: Within Functional Limits for tasks assessed Area of Impairment: Memory, Safety/judgement, Problem solving                         Safety/Judgement: Decreased awareness of safety, Decreased awareness of deficits   Problem Solving: Slow processing, Requires verbal cues General Comments: pleasant and eager to get OOB  Exercises      Shoulder Instructions       General Comments      Pertinent Vitals/ Pain       Pain Assessment Pain Assessment: Faces Faces Pain Scale: Hurts little more Pain Location: operative site with mobility Pain Descriptors / Indicators: Discomfort, Grimacing, Operative site guarding Pain Intervention(s): Limited activity within patient's tolerance, Monitored during session, Repositioned  Home Living                                           Prior Functioning/Environment              Frequency  Min 1X/week        Progress Toward Goals  OT Goals(current goals can now be found in the care plan section)  Progress towards OT goals: Progressing toward goals  Acute Rehab OT Goals Patient Stated Goal: get better OT Goal Formulation: With patient Time For Goal Achievement: 12/10/23 Potential to Achieve Goals: Good ADL Goals Pt Will Perform Grooming: with supervision;standing Pt Will Perform Upper Body Bathing: with set-up;sitting Pt Will Perform Lower Body Bathing: sit to/from stand;with contact guard assist Pt Will Perform Lower Body Dressing: with contact guard assist;sit to/from stand Pt Will Transfer to Toilet: with supervision;ambulating;regular height toilet  Plan      Co-evaluation                 AM-PAC OT "6 Clicks" Daily Activity     Outcome Measure   Help from another person eating meals?: A Little Help from another person taking care of personal grooming?: A Little Help from another person toileting, which includes using toliet, bedpan, or urinal?: A Lot Help from another person bathing (including washing, rinsing, drying)?: A Lot Help from another person to put on and taking off regular upper body clothing?: A Little Help from another person to put on and taking off regular lower body clothing?: A Lot 6 Click Score: 15    End of Session Equipment Utilized During Treatment: Rolling walker (2 wheels)  OT Visit Diagnosis: Unsteadiness on feet (R26.81);Muscle weakness (generalized) (M62.81)   Activity Tolerance Patient tolerated treatment well   Patient Left in chair;with call bell/phone within reach;with chair alarm set;with nursing/sitter in room   Nurse Communication Mobility status        Time: 9604-5409 OT Time Calculation (min): 18 min  Charges: OT General Charges $OT Visit: 1 Visit OT Treatments $Self Care/Home  Management : 8-22 mins  Alfonse Flavors, OTA Acute Rehabilitation Services  Office [phone]   Dewain Penning 12/07/2023, 10:51 AM

## 2023-12-07 NOTE — Progress Notes (Signed)
VTE noted on duplex.  + clots.  Switching lovenox to treatment dose.

## 2023-12-08 LAB — AEROBIC/ANAEROBIC CULTURE W GRAM STAIN (SURGICAL/DEEP WOUND)

## 2023-12-08 LAB — GLUCOSE, CAPILLARY: Glucose-Capillary: 103 mg/dL — ABNORMAL HIGH (ref 70–99)

## 2023-12-08 MED ORDER — METOPROLOL TARTRATE 5 MG/5ML IV SOLN
5.0000 mg | Freq: Four times a day (QID) | INTRAVENOUS | Status: DC
Start: 2023-12-08 — End: 2023-12-09
  Administered 2023-12-08 – 2023-12-09 (×4): 5 mg via INTRAVENOUS
  Filled 2023-12-08 (×4): qty 5

## 2023-12-08 MED ORDER — FAT EMUL FISH OIL/PLANT BASED 20% (SMOFLIPID)IV EMUL
250.0000 mL | INTRAVENOUS | Status: AC
  Administered 2023-12-08: 250 mL via INTRAVENOUS
  Filled 2023-12-08: qty 250

## 2023-12-08 MED ORDER — CLINIMIX E/DEXTROSE (8/10) 8 % IV SOLN
INTRAVENOUS | Status: AC
  Filled 2023-12-08: qty 1000

## 2023-12-08 NOTE — Progress Notes (Signed)
Physical Therapy Treatment Patient Details Name: Christine Morse MRN: [id number] DOB: 1936/01/06 Today's Date: 12/08/2023   History of Present Illness Patient is an 87 y/o female admitted 11/23/23 with diagnosis of duodenal adenoma and underwent laparoscopic Whipple procedure. post-op subcapsular fluid collection around the anterior aspect of the left liver lobe. Drain placement in IR 12/03/23. NGT inserted 12/15, dislodged then replaced.  Positive for R subclavian and axillary DVT on 12/07/23. 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    PT Comments  Patient progressing slowly with complications, now including R UE DVT.  She was able to mobilize overall with min A to bathroom then to walk in hallway.  Declined to doff briefs for toileting likely due to imbalance.  She continues to require acute level therapies to progress mobility.  Goals updated this session.  Plans for inpatient rehab (<3 hours/day) prior to d/c home.     If plan is discharge home, recommend the following: A little help with walking and/or transfers;Assistance with cooking/housework;Assist for transportation;Help with stairs or ramp for entrance;A little help with bathing/dressing/bathroom   Can travel by private vehicle     Yes  Equipment Recommendations  Rolling walker (2 wheels);BSC/3in1    Recommendations for Other Services       Precautions / Restrictions Precautions Precautions: Fall Precaution Comments: x2 blake drains, x1 JP drain; NGT     Mobility  Bed Mobility               General bed mobility comments: up on EOB with nurse tech (otw to bathroom and PT took over)    Transfers Overall transfer level: Needs assistance Equipment used: Rolling walker (2 wheels) Transfers: Sit to/from Stand Sit to Stand: Min assist           General transfer comment: some lifting help off EOB and CGA from toilet in bathroom (higher seat)    Ambulation/Gait Ambulation/Gait  assistance: Contact guard assist Gait Distance (Feet): 150 Feet Assistive device: Rolling walker (2 wheels) Gait Pattern/deviations: Step-through pattern, Decreased stride length, Trunk flexed, Wide base of support       General Gait Details: cues for forward gaze, assist for walker proximity, SpO2 on RA 91%   Stairs             Wheelchair Mobility     Tilt Bed    Modified Rankin (Stroke Patients Only)       Balance Overall balance assessment: Needs assistance Sitting-balance support: Feet supported Sitting balance-[name]: Fair Sitting balance - Comments: on EOB and on toilet in bathroom   Standing balance support: Bilateral upper extremity supported Standing balance-[name]: Poor Standing balance comment: declined to help with doffing/donning brief to toilet                            Cognition Arousal: Alert Behavior During Therapy: Flat affect Overall Cognitive Status: Impaired/Different from baseline Area of Impairment: Memory, Safety/judgement, Problem solving                     Memory: Decreased short-term memory   Safety/Judgement: Decreased awareness of deficits   Problem Solving: Slow processing          Exercises      General Comments General comments (skin integrity, edema, etc.): NGT hooked back to suction end of session; RN aware O2 off and SpO2 90's; attempted to remove nasal cannula, though hooked in through  NGT      Pertinent Vitals/Pain Pain Assessment Pain Assessment: Faces Faces Pain Scale: Hurts little more Pain Location: generalized with mobility Pain Descriptors / Indicators: Discomfort, Grimacing, Operative site guarding Pain Intervention(s): Monitored during session, Repositioned    Home Living                          Prior Function            PT Goals (current goals can now be found in the care plan section) Acute Rehab PT Goals Patient Stated Goal: return to independent PT  Goal Formulation: With patient/family Time For Goal Achievement: 12/22/23 Potential to Achieve Goals: Fair Progress towards PT goals: Progressing toward goals;Goals updated    Frequency    Min 1X/week      PT Plan      Co-evaluation              AM-PAC PT "6 Clicks" Mobility   Outcome Measure  Help needed turning from your back to your side while in a flat bed without using bedrails?: A Little Help needed moving from lying on your back to sitting on the side of a flat bed without using bedrails?: A Little Help needed moving to and from a bed to a chair (including a wheelchair)?: A Little Help needed standing up from a chair using your arms (e.g., wheelchair or bedside chair)?: A Little Help needed to walk in hospital room?: A Little Help needed climbing 3-5 steps with a railing? : Total 6 Click Score: 16    End of Session   Activity Tolerance: Patient limited by fatigue Patient left: in chair;with chair alarm set;with call bell/phone within reach   PT Visit Diagnosis: Other abnormalities of gait and mobility (R26.89);Difficulty in walking, not elsewhere classified (R26.2);Pain Pain - part of body:  (abdomen)     Time: 7829-5621 PT Time Calculation (min) (ACUTE ONLY): 20 min  Charges:    $Gait Training: 8-22 mins PT General Charges $$ ACUTE PT VISIT: 1 Visit                     Sheran Lawless, PT Acute Rehabilitation Services Office:[phone] 12/08/2023    [name] 12/08/2023, 5:19 PM

## 2023-12-08 NOTE — Progress Notes (Signed)
15 Days Post-Op   Subjective/Chief Complaint: Doing a little better today. Venous duplex was positive for VTE and pt started on treatment dose lovenox.    Objective: Vital signs in last 24 hours: Temp:  [96.7 F (35.9 C)-98.1 F (36.7 C)] 97.2 F (36.2 C) (12/18 0804) Pulse Rate:  [76-84] 76 (12/18 0804) Resp:  [18-21] 20 (12/18 0804) BP: (139-168)/(59-74) 166/61 (12/18 0804) SpO2:  [96 %-99 %] 98 % (12/18 0804) Weight:  [58.8 kg] 58.8 kg (12/18 0300) Last BM Date : 12/05/23  Intake/Output from previous day: 12/17 0701 - 12/18 0700 In: 15 [I.V.:15] Out: 930 [Emesis/NG output:900; Drains:30] Intake/Output this shift: No intake/output data recorded.  General appearance: alert, cooperative. NGT with bilious output.  Resp: breathing comfortably on [place].  GI: soft, non distended, not significantly tender. Drains left remains bilious and murky. Right murky brown.  Incision without erythema or drainage.  Perc drain remains serous and transparent.  Extremities: extremities normal, atraumatic, no cyanosis or edema  Lab Results:  Recent Labs    12/07/23 1142  WBC 14.5*  HGB 9.6*  HCT 31.2*  PLT 424*    BMET Recent Labs    12/06/23 0537  NA 139  K 4.3  CL 109  CO2 24  GLUCOSE 134*  BUN 23  CREATININE 0.50  CALCIUM 8.5*   PT/INR No results for input(s): "LABPROT", "INR" in the last 72 hours.   ABG No results for input(s): "PHART", "HCO3" in the last 72 hours.  Invalid input(s): "PCO2", "PO2"   Studies/Results: VAS [place] UPPER EXTREMITY VENOUS DUPLEX Result Date: 12/07/2023 UPPER VENOUS STUDY  Patient Name:  Christine Morse  Date of Exam:   12/07/2023 Medical Rec #: [id number]       Accession #:    [id number] Date of Birth: 1936-04-01      Patient Gender: F Patient Age:   87 years Exam Location:  [facility] Procedure:      VAS [place] UPPER EXTREMITY VENOUS DUPLEX Referring Phys: CHELSEA CONNOR  --------------------------------------------------------------------------------  Indications: Swelling Risk Factors: Immobility. Comparison Study: None Performing Technologist: Shona Simpson  Examination Guidelines: A complete evaluation includes B-mode imaging, spectral Doppler, color Doppler, and power Doppler as needed of all accessible portions of each vessel. Bilateral testing is considered an integral part of a complete examination. Limited examinations for reoccurring indications may be performed as noted.  Right Findings: +----------+------------+---------+-----------+----------+-------+ RIGHT     CompressiblePhasicitySpontaneousPropertiesSummary +----------+------------+---------+-----------+----------+-------+ IJV           Full       Yes       Yes                      +----------+------------+---------+-----------+----------+-------+ Subclavian  Partial      Yes       Yes               Acute  +----------+------------+---------+-----------+----------+-------+ Axillary    Partial      Yes       Yes               Acute  +----------+------------+---------+-----------+----------+-------+ Brachial      Full       Yes       Yes                      +----------+------------+---------+-----------+----------+-------+ Radial        Full       Yes       Yes                      +----------+------------+---------+-----------+----------+-------+  Ulnar         Full       Yes       Yes                      +----------+------------+---------+-----------+----------+-------+ Cephalic      Full       Yes       Yes                      +----------+------------+---------+-----------+----------+-------+ Basilic       None       No        No                Acute  +----------+------------+---------+-----------+----------+-------+ Acute Thrombosis surrounding PICC line from Basilic V, through Axial V, and into the Subclavian V.  Left Findings:  +----------+------------+---------+-----------+----------+-------+ LEFT      CompressiblePhasicitySpontaneousPropertiesSummary +----------+------------+---------+-----------+----------+-------+ Subclavian    Full       Yes       Yes                      +----------+------------+---------+-----------+----------+-------+  Summary:  Right: Findings consistent with acute deep vein thrombosis involving the right subclavian vein and right axillary vein. Findings consistent with acute superficial vein thrombosis involving the right basilic vein. Acute Thrombosis surrounding PICC line from Basilic V, through Axial V, and into the Subclavian V.  Left: No evidence of thrombosis in the subclavian.  *See table(s) above for measurements and observations.  Diagnosing physician: Lemar Livings MD Electronically signed by Lemar Livings MD on 12/07/2023 at 1:37:50 PM.    Final      Anti-infectives: Anti-infectives (From admission, onward)    Start     Dose/Rate Route Frequency Ordered Stop   11/30/23 1230  fluconazole (DIFLUCAN) IVPB 400 mg        400 mg 100 mL/hr over 120 Minutes Intravenous Every 24 hours 11/30/23 1135     11/26/23 0945  piperacillin-tazobactam (ZOSYN) IVPB 3.375 g        3.375 g 12.5 mL/hr over 240 Minutes Intravenous Every 8 hours 11/26/23 0853     11/23/23 2100  ceFAZolin (ANCEF) IVPB 2g/100 mL premix        2 g 200 mL/hr over 30 Minutes Intravenous Every 8 hours 11/23/23 1654 11/23/23 2214   11/23/23 0700  ceFAZolin (ANCEF) IVPB 2g/100 mL premix        2 g 200 mL/hr over 30 Minutes Intravenous On call to O.R. 11/23/23 0654 11/23/23 1249       Assessment/Plan: s/p Procedure(s) with comments: WHIPPLE PROCEDURE (N/A) 11/23/23 POD 15   Final pathology - adenocarcinoma duodenum, positive margin, positive LN - [id number],   HOCM - continue beta blockade and treat hypertension.   Anemia of chronic disease, chronic blood loss anemia, and acute blood loss anemia - stable.  Recheck today.  Hyperglycemia - continue to check fingersticks.   Hypophosphatemia - recheck thursday Hypokalemia - recheck thursday Severe protein calorie malnutrition - TNA, nutrition consult.   Pancreatic leak - Continue Zosyn/Fluconazole. Leukocytosis improving.  Drain output coming down.    Delayed gastric emptying. Can do some reglan, but not indefinitely due to QTc.  Recheck EKG today.    CT aspiration and drain placement 12/13 (anterior hepatic fluid collection) - serosang. Gram stain - no organisms; NGTD  Pain control with prn tylenol, prn robaxin, prn fentanyl.  OOB, IS, PT consult.  VTE - lovenox treatment  dose. Keep picc.    SW consult for placement.   LOS: 15 days   Maudry Diego, MD, FACS, FSSO Surgical Oncology, General Surgery, Trauma and Critical [facility] Surgery, [place] [phone] for weekday/non holidays Check [email or web address] for coverage night/weekend/holidays

## 2023-12-08 NOTE — Progress Notes (Signed)
Referring Physician(s): Dr. Donell Beers  Supervising Physician: Richarda Overlie  Patient Status:  [facility] - In-pt  Chief Complaint: Duodenal adenocarcinoma s/p Whipple procedure with post-op subcapsular fluid collection around the anterior aspect of the left liver lobe. Drain placement in IR 12/03/23  Subjective: Patient sitting up in the chair watching TV. She denies significant pain or discomfort.   Allergies: Codeine  Medications: Prior to Admission medications   Medication Sig Start Date End Date Taking? Authorizing Provider  acetaminophen (TYLENOL) 500 MG tablet Take 1,000 mg by mouth every 6 (six) hours as needed for mild pain, moderate pain or headache.   Yes [provider]  Calcium Carbonate-Vitamin D (CALCIUM + D PO) Take 2 tablets by mouth daily.    Yes [provider]  ferrous sulfate 325 (65 FE) MG tablet Take 1 tablet (325 mg total) by mouth daily with breakfast. 10/05/13  Yes Christiane Ha, MD  metoprolol succinate (TOPROL-XL) 25 MG 24 hr tablet Take 0.5 tablets (12.5 mg total) by mouth daily. 10/27/23 11/26/23 Yes Pahwani, Daleen Bo, MD  pantoprazole (PROTONIX) 40 MG tablet Take 1 tablet (40 mg total) by mouth 2 (two) times daily. 11/08/23 05/06/24 Yes Almon Hercules, MD  rosuvastatin (CRESTOR) 5 MG tablet Take 5 mg by mouth daily. 07/10/22  Yes [provider]     Vital Signs: BP (!) 157/68 (BP Location: Left Arm)   Pulse 81   Temp (!) 97.5 F (36.4 C) (Axillary)   Resp 19   Ht 4\' 10"  (1.473 m)   Wt 129 lb 10.1 oz (58.8 kg)   SpO2 99%   BMI 27.09 kg/m   Physical Exam Constitutional:      General: She is not in acute distress. Pulmonary:     Effort: Pulmonary effort is normal.  Abdominal:     Tenderness: There is abdominal tenderness.     Comments: Midline abdominal incision closed with staples. Mid upper abdominal IR drain to suction. Suture in place and approximately 25 ml in bulb. Drain easily flushed with 5 ml NS. There are two other  surgical drains to gravity.   Skin:    General: Skin is warm and dry.  Neurological:     Mental Status: She is alert and oriented to person, place, and time.     Imaging: VAS [place] UPPER EXTREMITY VENOUS DUPLEX Result Date: 12/07/2023 UPPER VENOUS STUDY  Patient Name:  BITANIA MATUSEK  Date of Exam:   12/07/2023 Medical Rec #: [id number]       Accession #:    [id number] Date of Birth: 19-Nov-1936      Patient Gender: F Patient Age:   87 years Exam Location:  [facility] Procedure:      VAS [place] UPPER EXTREMITY VENOUS DUPLEX Referring Phys: CHELSEA CONNOR --------------------------------------------------------------------------------  Indications: Swelling Risk Factors: Immobility. Comparison Study: None Performing Technologist: Shona Simpson  Examination Guidelines: A complete evaluation includes B-mode imaging, spectral Doppler, color Doppler, and power Doppler as needed of all accessible portions of each vessel. Bilateral testing is considered an integral part of a complete examination. Limited examinations for reoccurring indications may be performed as noted.  Right Findings: +----------+------------+---------+-----------+----------+-------+ RIGHT     CompressiblePhasicitySpontaneousPropertiesSummary +----------+------------+---------+-----------+----------+-------+ IJV           Full       Yes       Yes                      +----------+------------+---------+-----------+----------+-------+ Subclavian  Partial      Yes       Yes               Acute  +----------+------------+---------+-----------+----------+-------+ Axillary    Partial      Yes       Yes               Acute  +----------+------------+---------+-----------+----------+-------+ Brachial      Full       Yes       Yes                      +----------+------------+---------+-----------+----------+-------+ Radial        Full       Yes       Yes                       +----------+------------+---------+-----------+----------+-------+ Ulnar         Full       Yes       Yes                      +----------+------------+---------+-----------+----------+-------+ Cephalic      Full       Yes       Yes                      +----------+------------+---------+-----------+----------+-------+ Basilic       None       No        No                Acute  +----------+------------+---------+-----------+----------+-------+ Acute Thrombosis surrounding PICC line from Basilic V, through Axial V, and into the Subclavian V.  Left Findings: +----------+------------+---------+-----------+----------+-------+ LEFT      CompressiblePhasicitySpontaneousPropertiesSummary +----------+------------+---------+-----------+----------+-------+ Subclavian    Full       Yes       Yes                      +----------+------------+---------+-----------+----------+-------+  Summary:  Right: Findings consistent with acute deep vein thrombosis involving the right subclavian vein and right axillary vein. Findings consistent with acute superficial vein thrombosis involving the right basilic vein. Acute Thrombosis surrounding PICC line from Basilic V, through Axial V, and into the Subclavian V.  Left: No evidence of thrombosis in the subclavian.  *See table(s) above for measurements and observations.  Diagnosing physician: Lemar Livings MD Electronically signed by Lemar Livings MD on 12/07/2023 at 1:37:50 PM.    Final    DG Abdomen 1 View Result Date: 12/05/2023 CLINICAL DATA:  87 year old female status post nasogastric tube placement. EXAM: ABDOMEN - 1 VIEW COMPARISON:  Abdominal radiograph 12/05/2023. FINDINGS: Nasogastric tube tip is in the antral pre-pyloric region of the stomach. Midline surgical staples. Surgical drain projecting over the abdomen with tip projecting over the right iliac crest. Small bore drainage catheter with pigtail reformed over the right upper quadrant of the  abdomen. Paucity of bowel gas. IMPRESSION: 1. Support apparatus and postoperative changes, as above. Electronically Signed   By: Trudie Reed M.D.   On: 12/05/2023 08:03   DG Abdomen 1 View Result Date: 12/05/2023 CLINICAL DATA:  NG tube EXAM: ABDOMEN - 1 VIEW COMPARISON:  Abdominal x-ray 11/27/2023 FINDINGS: Nasogastric tube tip is at the level of the mid stomach. Midline skin staples and surgical drain are seen in the mid abdomen. Bowel-gas pattern is nonobstructive. IMPRESSION: Nasogastric tube tip is  at the level of the mid stomach. Electronically Signed   By: Darliss Cheney M.D.   On: 12/05/2023 01:13    Labs:  CBC: Recent Labs    12/01/23 0454 12/02/23 0500 12/03/23 0544 12/07/23 1142  WBC 18.3* 17.5* 19.2* 14.5*  HGB 9.8* 9.1* 10.0* 9.6*  HCT 30.0* 28.9* 32.0* 31.2*  PLT 323 427* 478* 424*    COAGS: Recent Labs    11/15/23 1115 11/24/23 0405 11/25/23 0510 12/03/23 0544  INR 1.0 1.4* 1.3* 1.2    BMP: Recent Labs    12/02/23 0500 12/03/23 0544 12/04/23 0329 12/06/23 0537  NA 141 140 136 139  K 3.1* 3.9 3.7 4.3  CL 105 108 109 109  CO2 27 27 23 24   GLUCOSE 163* 147* 155* 134*  BUN 24* 18 20 23   CALCIUM 8.5* 8.7* 8.3* 8.5*  CREATININE 0.47 0.75 0.48 0.50  GFRNONAA >60 >60 >60 >60    LIVER FUNCTION TESTS: Recent Labs    12/01/23 0454 12/02/23 0500 12/03/23 0544 12/06/23 0537  BILITOT 1.2* 0.6 0.7 0.6  AST 21 17 16 19   ALT 15 15 14 23   ALKPHOS 45 45 50 87  PROT 4.3* 4.6* 4.7* 4.9*  ALBUMIN 2.7* 2.6* 2.6* 2.3*    Assessment and Plan:   Duodenal adenocarcinoma s/p Whipple procedure with post-op subcapsular fluid collection around the anterior aspect of the left liver lobe. Drain placement in IR 12/03/23  Down trending WBC, she is afebrile. Cultures grew candida albicans.  Approximately 25 ml output in the past 24 hours.   Drain Location: Upper mid abdomen Size: Fr size: 10 Fr Date of placement: 12/03/23  Currently to: Drain collection  device: suction bulb 24 hour output:  Output by Drain (mL) 12/06/23 0700 - 12/06/23 1459 12/06/23 1500 - 12/06/23 2259 12/06/23 2300 - 12/07/23 0659 12/07/23 0700 - 12/07/23 1459 12/07/23 1500 - 12/07/23 2259 12/07/23 2300 - 12/08/23 0659 12/08/23 0700 - 12/08/23 1244  Closed System Drain 1 Right RLQ Other (Comment) 19 Fr.  50 0  10 0   Closed System Drain 1 Left LLQ Other (Comment) 19 Fr.  30 0  5 0   Closed System Drain Right Abdomen Bulb (JP) 10 Fr.  10 0  15 0     Interval imaging/drain manipulation:  None  Current examination: Flushes/aspirates easily.  Insertion site unremarkable. Suture and stat lock in place. Dressed appropriately.   Plan: Continue TID flushes with 5 cc NS. Record output Q shift. Dressing changes QD or PRN if soiled.  Call IR APP or on call IR MD if difficulty flushing or sudden change in drain output.  Repeat imaging/possible drain injection once output < 10 mL/QD (excluding flush material). Consideration for drain removal if output is < 10 mL/QD (excluding flush material), pending discussion with the providing surgical service.  Discharge planning: Please contact IR APP or on call IR MD prior to patient d/c to ensure appropriate follow up plans are in place. Typically patient will follow up with IR clinic 10-14 days post d/c for repeat imaging/possible drain injection. IR scheduler will contact patient with date/time of appointment. Patient will need to flush drain QD with 5 cc NS, record output QD, dressing changes every 2-3 days or earlier if soiled.   IR will continue to follow - please call with questions or concerns.  Electronically Signed: Alwyn Ren, AGACNP-BC [phone] 12/08/2023, 12:40 PM   I spent a total of 15 Minutes at the the patient's bedside AND on the patient's  hospital floor or unit, greater than 50% of which was counseling/coordinating care for abscess drain.

## 2023-12-08 NOTE — Progress Notes (Signed)
Rounding Note    Patient Name: Christine Morse Date of Encounter: 12/08/2023  [facility] Cardiologist: Rollene Rotunda, MD   Subjective   On 12/13 Had Surgery w/ specimens:   Liver nodule left lateral segment Cystic liver nodule left lateral segment 3.   Pancreaticoduodenectomy with gallbladder:  4.   Node at ligament of trietz 5.  Mesenteric nodule 6. Proximal jejunum  12/17 Pt denies SOB, no chest pain.  Says tube not coming out today. Says is using Incent Spirometry C/o swelling R arm  (PICC in that arm is clotted, [place] ordered, Nurse to make sure it happens this am, TPA ordered)   Inpatient Medications    Scheduled Meds:  Chlorhexidine Gluconate Cloth  6 each Topical Q0600   enoxaparin (LOVENOX) injection  60 mg Subcutaneous Q12H   feeding supplement  237 mL Oral TID BM   metoCLOPramide (REGLAN) injection  5 mg Intravenous Q8H   multivitamin with minerals  1 tablet Oral Daily   pantoprazole (PROTONIX) IV  40 mg Intravenous Q12H   sodium chloride flush  10-40 mL Intracatheter Q12H   sodium chloride flush  5 mL Intracatheter Q8H   Continuous Infusions:  TPN (CLINIMIX-E) Adult     And   fat emul(SMOFlipid)     fluconazole (DIFLUCAN) IV 400 mg (12/07/23 1246)   piperacillin-tazobactam (ZOSYN)  IV 3.375 g (12/08/23 0835)   TPN (CLINIMIX-E) Adult 42 mL/hr at 12/07/23 1750   PRN Meds: acetaminophen, fentaNYL (SUBLIMAZE) injection, guaiFENesin-dextromethorphan, haloperidol lactate, ipratropium-albuterol, methocarbamol (ROBAXIN) injection, metoprolol tartrate, ondansetron (ZOFRAN) IV, mouth rinse, prochlorperazine **OR** prochlorperazine, sodium chloride flush   Vital Signs    Vitals:   12/07/23 2330 12/08/23 0300 12/08/23 0804 12/08/23 1124  BP: 139/69 (!) 166/74 (!) 166/61 (!) 157/68  Pulse: 77 77 76 81  Resp: (!) 21 18 20 19   Temp: 98.1 F (36.7 C) 97.9 F (36.6 C) (!) 97.2 F (36.2 C) (!) 97.5 F (36.4 C)  TempSrc: Axillary Oral Axillary Axillary   SpO2: 96% 97% 98% 99%  Weight:  58.8 kg    Height:        Intake/Output Summary (Last 24 hours) at 12/08/2023 1214 Last data filed at 12/08/2023 0508 Gross per 24 hour  Intake 5 ml  Output 930 ml  Net -925 ml      12/08/2023    3:00 AM 12/06/2023    3:30 AM 12/05/2023    5:00 AM  Last 3 Weights  Weight (lbs) 129 lb 10.1 oz 127 lb 10.3 oz 134 lb 6.3 oz  Weight (kg) 58.8 kg 57.9 kg 60.96 kg      Telemetry    SR  - Personally Reviewed  ECG    ECG today w/ HR 83, QT/QTc 366/430 ms previously was 386/491 ms on 11/02 - Personally Reviewed  Physical Exam   GEN: Very thin 87 yo in no acute distress.   Neck: No JVD Cardiac: RRR, III/VI systolic murmur noted   Respiratory:  few rales bases bilaterally GI: Soft, +tender, non-distended  MS: + R arm edema; No deformity. Neuro:  Nonfocal  Psych: Normal affect   Labs    High Sensitivity Troponin:  No results for input(s): "TROPONINIHS" in the last 720 hours.   Chemistry Recent Labs  Lab 12/02/23 0500 12/03/23 0544 12/04/23 0329 12/06/23 0537  NA 141 140 136 139  K 3.1* 3.9 3.7 4.3  CL 105 108 109 109  CO2 27 27 23 24   GLUCOSE 163* 147* 155* 134*  BUN 24* 18 20 23   CREATININE 0.47 0.75 0.48 0.50  CALCIUM 8.5* 8.7* 8.3* 8.5*  MG 2.1 2.1 2.1 2.1  PROT 4.6* 4.7*  --  4.9*  ALBUMIN 2.6* 2.6*  --  2.3*  AST 17 16  --  19  ALT 15 14  --  23  ALKPHOS 45 50  --  87  BILITOT 0.6 0.7  --  0.6  GFRNONAA >60 >60 >60 >60  ANIONGAP 9 5 4* 6    Lipids  Recent Labs  Lab 12/06/23 0537  TRIG 103    Hematology Recent Labs  Lab 12/02/23 0500 12/03/23 0544 12/07/23 1142  WBC 17.5* 19.2* 14.5*  RBC 2.98* 3.27* 3.15*  HGB 9.1* 10.0* 9.6*  HCT 28.9* 32.0* 31.2*  MCV 97.0 97.9 99.0  MCH 30.5 30.6 30.5  MCHC 31.5 31.3 30.8  RDW 15.1 15.0 15.5  PLT 427* 478* 424*   Thyroid No results for input(s): "TSH", "FREET4" in the last 168 hours.  BNPNo results for input(s): "BNP", "PROBNP" in the last 168 hours.  DDimer No  results for input(s): "DDIMER" in the last 168 hours.   Radiology    VAS [place] UPPER EXTREMITY VENOUS DUPLEX Result Date: 12/07/2023 UPPER VENOUS STUDY  Patient Name:  NICOLINA YOAK  Date of Exam:   12/07/2023 Medical Rec #: [id number]       Accession #:    [id number] Date of Birth: 1936-09-06      Patient Gender: F Patient Age:   87 years Exam Location:  [facility] Procedure:      VAS [place] UPPER EXTREMITY VENOUS DUPLEX Referring Phys: CHELSEA CONNOR --------------------------------------------------------------------------------  Indications: Swelling Risk Factors: Immobility. Comparison Study: None Performing Technologist: Shona Simpson  Examination Guidelines: A complete evaluation includes B-mode imaging, spectral Doppler, color Doppler, and power Doppler as needed of all accessible portions of each vessel. Bilateral testing is considered an integral part of a complete examination. Limited examinations for reoccurring indications may be performed as noted.  Right Findings: +----------+------------+---------+-----------+----------+-------+ RIGHT     CompressiblePhasicitySpontaneousPropertiesSummary +----------+------------+---------+-----------+----------+-------+ IJV           Full       Yes       Yes                      +----------+------------+---------+-----------+----------+-------+ Subclavian  Partial      Yes       Yes               Acute  +----------+------------+---------+-----------+----------+-------+ Axillary    Partial      Yes       Yes               Acute  +----------+------------+---------+-----------+----------+-------+ Brachial      Full       Yes       Yes                      +----------+------------+---------+-----------+----------+-------+ Radial        Full       Yes       Yes                      +----------+------------+---------+-----------+----------+-------+ Ulnar         Full       Yes       Yes                       +----------+------------+---------+-----------+----------+-------+  Cephalic      Full       Yes       Yes                      +----------+------------+---------+-----------+----------+-------+ Basilic       None       No        No                Acute  +----------+------------+---------+-----------+----------+-------+ Acute Thrombosis surrounding PICC line from Basilic V, through Axial V, and into the Subclavian V.  Left Findings: +----------+------------+---------+-----------+----------+-------+ LEFT      CompressiblePhasicitySpontaneousPropertiesSummary +----------+------------+---------+-----------+----------+-------+ Subclavian    Full       Yes       Yes                      +----------+------------+---------+-----------+----------+-------+  Summary:  Right: Findings consistent with acute deep vein thrombosis involving the right subclavian vein and right axillary vein. Findings consistent with acute superficial vein thrombosis involving the right basilic vein. Acute Thrombosis surrounding PICC line from Basilic V, through Axial V, and into the Subclavian V.  Left: No evidence of thrombosis in the subclavian.  *See table(s) above for measurements and observations.  Diagnosing physician: Lemar Livings MD Electronically signed by Lemar Livings MD on 12/07/2023 at 1:37:50 PM.    Final      Cardiac Studies   Echo  10/24/23    1. Proximal septal thickening with SAM and narrow LVOT; turbulence noted with peak velocity 3.3 m/s (obstructive physiology). Previous study from 08/13/22 reviewed and appears similar (peak LVOT gradient 5.3 m/s; obstruction appears to be at LVOT level and not aortic stenosis as reported).   2. Left ventricular ejection fraction, by estimation, is 60 to 65%. The left ventricle has normal function. The left ventricle has no regional wall motion abnormalities. There is mild concentric left ventricular hypertrophy. Left ventricular diastolic parameters are  consistent with Grade I diastolic dysfunction (impaired  relaxation). Elevated left atrial pressure.   3. Right ventricular systolic function is normal. The right ventricular size is normal. There is mildly elevated pulmonary artery systolic pressure.   4. Left atrial size was severely dilated.   5. The mitral valve is normal in structure. Moderate mitral valve  regurgitation. No evidence of mitral stenosis.   6. The aortic valve is calcified. Aortic valve regurgitation is trivial. No aortic stenosis is present.   7. The inferior vena cava is normal in size with greater than 50% respiratory variability, suggesting right atrial pressure of 3 mmHg.    Patient Profile     87 y.o. 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   Admitted 12/02 for surgery listed above, Cards has been following for HOCM.  PICC line inserted 12/17, U/A to get blood return.  Assessment & Plan    1  HOCM   - Echo w/ vigorous LV function  - some dynamic obstruction w/ peak gradient 3.3 m/s - no volume overload by exam Avoid vasodilators   2  Mitral regurgitation  - moderate MR w/ calcified aortic valve on echo   - follow as outpt  3  HTN   BP is high   I have switched metoprolol to q 6 hours (not prn)  Follow    IV until taking PO   4  HL   - restart Crestor when taking  POs  5. 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

## 2023-12-08 NOTE — Plan of Care (Signed)
  Problem: Education: Goal: Knowledge of General Education information will improve Description: Including pain rating scale, medication(s)/side effects and non-pharmacologic comfort measures Outcome: Progressing   Problem: Health Behavior/Discharge Planning: Goal: Ability to manage health-related needs will improve Outcome: Progressing    Problem: Clinical Measurements: Goal: Cardiovascular complication will be avoided Outcome: Progressing   Problem: Activity: Goal: Risk for activity intolerance will decrease Outcome: Progressing    Problem: Coping: Goal: Level of anxiety will decrease Outcome: Progressing   Problem: Safety: Goal: Ability to remain free from injury will improve Outcome: Progressing

## 2023-12-08 NOTE — Progress Notes (Signed)
PHARMACY - TOTAL PARENTERAL NUTRITION CONSULT NOTE  Indication:  delayed gastric emptying  Patient Measurements: Height: 4\' 10"  (147.3 cm) Weight: 58.8 kg (129 lb 10.1 oz) IBW/kg (Calculated) : 40.9 TPN AdjBW (KG): 44.5 Body mass index is 27.09 kg/m. 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  Patient was started on a CLD on 12/6-12/9 and then advanced to a soft diet since 12/10.  Patient has had minimal PO intake since being post-op and continues to experience nausea. Delayed gastric emptying likely related to possible pancreatic leak.  Pharmacy consulted to dose TPN.  Glucose / Insulin: A1c 3.7 (11/15/23) - CBGs < 180. Used 2 units SSI in the past 24 hrs Electrolytes: No labs since 12/16 - wnl Renal: SCr < 1, BUN WNL Hepatic: LFTs / tbili WNL, albumin 2.3 Intake / Output; MIVF: UOP not charted, NG 750 ml, drain 30 mL, LBM 12/15 (s/p Reglan x3 doses 12/11-12/12, restarted 12/15); GI Imaging: 12/07 Ab XR: unremarkable bowel gas pattern. 12/11 CT: possible post-op seroma or hematoma vs abscess, distal colonic diverticulosis 12/15 Ab XR: expected post-op changes, NGT in correct place GI Surgeries / Procedures:  12/3 diagnostic laparoscopy, Whipple procedure, pancreatic duct stent 12/13 subhepatic fluid collection aspiration and drainage  Central access: PICC placed 12/01/23 TPN start date: 12/02/23  Nutritional Goals: Clinimix E 8/10 1L at 42 ml/hr on Sun/Tues/Wed/Thurs/Fri/Sat Clinimix E 8/10 2L at 83 ml/hr on Mon SMOF lipids 250 ml daily  Provides 100% AA goal and ~95% of kCal goal  Electrolytes in Clinimix E 1L bag: Na , K , Mag , Ca 4.82mEq, Phos , Ac , Cl  Electrolytes in Clnimix E 8/10 2L bag: Na 70 mEq, K 60 mEq, Mag 10 mEq, Ca 9 mEq, Phos 30 mmol, Acetate 166 , Cl 152 mEq   RD Estimated  Needs Total Energy Estimated Needs: 1400-1700 kcal/d Total Protein Estimated Needs: 65-80g/d Total Fluid Estimated Needs: 1.5-1.7L/d  Current Nutrition:  TPN NPO  Plan:  Clinimix E 8/10 1L at 42 ml/hr on Sun/Tues/Wed/Thurs/Fri/Sat Clinimix E 8/10 2L at 83 ml/hr on Mon only SMOFlipid daily  Provides a weekly average of 1257 kCal and 92g AA per day, meeting 90% of minimal kCal and 115% of protein needs PO multivitamin daily (no MVI and trace elements in TPN) D/c SSI and CBG checks Monitor TPN labs on Mon/Thurs F/U ability to advance diet back from NPO   Christoper Fabian, PharmD, BCPS Please see amion for complete clinical pharmacist phone list 12/08/2023 9:08 AM

## 2023-12-09 DIAGNOSIS — D132 Benign neoplasm of duodenum: Secondary | ICD-10-CM | POA: Diagnosis not present

## 2023-12-09 LAB — CBC
HCT: 32.3 % — ABNORMAL LOW (ref 36.0–46.0)
Hemoglobin: 10.1 g/dL — ABNORMAL LOW (ref 12.0–15.0)
MCH: 30.3 pg (ref 26.0–34.0)
MCHC: 31.3 g/dL (ref 30.0–36.0)
MCV: 97 fL (ref 80.0–100.0)
Platelets: 339 10*3/uL (ref 150–400)
RBC: 3.33 MIL/uL — ABNORMAL LOW (ref 3.87–5.11)
RDW: 15.5 % (ref 11.5–15.5)
WBC: 14.5 10*3/uL — ABNORMAL HIGH (ref 4.0–10.5)
nRBC: 0 % (ref 0.0–0.2)

## 2023-12-09 LAB — COMPREHENSIVE METABOLIC PANEL
ALT: 19 U/L (ref 0–44)
AST: 18 U/L (ref 15–41)
Albumin: 2.4 g/dL — ABNORMAL LOW (ref 3.5–5.0)
Alkaline Phosphatase: 93 U/L (ref 38–126)
Anion gap: 9 (ref 5–15)
BUN: 17 mg/dL (ref 8–23)
CO2: 17 mmol/L — ABNORMAL LOW (ref 22–32)
Calcium: 8.5 mg/dL — ABNORMAL LOW (ref 8.9–10.3)
Chloride: 111 mmol/L (ref 98–111)
Creatinine, Ser: 0.43 mg/dL — ABNORMAL LOW (ref 0.44–1.00)
GFR, Estimated: >60 mL/min (ref >60)
Glucose, Bld: 124 mg/dL — ABNORMAL HIGH (ref 70–99)
Potassium: 3.6 mmol/L (ref 3.5–5.1)
Sodium: 137 mmol/L (ref 135–145)
Total Bilirubin: 0.5 mg/dL (ref <1.2)
Total Protein: 5.7 g/dL — ABNORMAL LOW (ref 6.5–8.1)

## 2023-12-09 LAB — GLUCOSE, CAPILLARY
Glucose-Capillary: 92 mg/dL (ref 70–99)
Glucose-Capillary: 119 mg/dL — ABNORMAL HIGH (ref 70–99)
Glucose-Capillary: 127 mg/dL — ABNORMAL HIGH (ref 70–99)
Glucose-Capillary: 124 mg/dL — ABNORMAL HIGH (ref 70–99)
Glucose-Capillary: 114 mg/dL — ABNORMAL HIGH (ref 70–99)
Glucose-Capillary: 103 mg/dL — ABNORMAL HIGH (ref 70–99)
Glucose-Capillary: 103 mg/dL — ABNORMAL HIGH (ref 70–99)
Glucose-Capillary: 105 mg/dL — ABNORMAL HIGH (ref 70–99)

## 2023-12-09 LAB — MAGNESIUM: Magnesium: 2.1 mg/dL (ref 1.7–2.4)

## 2023-12-09 LAB — PHOSPHORUS: Phosphorus: 2.7 mg/dL (ref 2.5–4.6)

## 2023-12-09 MED ORDER — CLINIMIX E/DEXTROSE (8/10) 8 % IV SOLN
INTRAVENOUS | Status: DC
  Filled 2023-12-09: qty 1000

## 2023-12-09 MED ORDER — FAT EMUL FISH OIL/PLANT BASED 20% (SMOFLIPID)IV EMUL
250.0000 mL | INTRAVENOUS | Status: AC
  Administered 2023-12-09: 250 mL via INTRAVENOUS
  Filled 2023-12-09: qty 250

## 2023-12-09 MED ORDER — METOPROLOL TARTRATE 5 MG/5ML IV SOLN
5.0000 mg | Freq: Every day | INTRAVENOUS | Status: DC
Start: 2023-12-09 — End: 2023-12-15
  Administered 2023-12-09 – 2023-12-15 (×36): 5 mg via INTRAVENOUS
  Filled 2023-12-09 (×36): qty 5

## 2023-12-09 MED ORDER — POTASSIUM CHLORIDE 10 MEQ/50ML IV SOLN
10.0000 meq | INTRAVENOUS | Status: AC
  Administered 2023-12-09 (×3): 10 meq via INTRAVENOUS
  Filled 2023-12-09 (×3): qty 50

## 2023-12-09 MED ORDER — FAT EMUL FISH OIL/PLANT BASED 20% (SMOFLIPID)IV EMUL
250.0000 mL | INTRAVENOUS | Status: DC
  Filled 2023-12-09: qty 250

## 2023-12-09 MED ORDER — TRACE MINERALS CU-MN-SE-ZN 300-55-60-3000 MCG/ML IV SOLN
INTRAVENOUS | Status: AC
  Filled 2023-12-09 (×2): qty 1000

## 2023-12-09 NOTE — Progress Notes (Signed)
16 Days Post-Op   Subjective/Chief Complaint: Patient is feeling worse today with nausea.  Also complaining of some pain this AM.    Objective: Vital signs in last 24 hours: Temp:  [97.3 F (36.3 C)-99.1 F (37.3 C)] 98.4 F (36.9 C) (12/19 0738) Pulse Rate:  [76-84] 81 (12/19 0325) Resp:  [19-20] 20 (12/19 0738) BP: (154-190)/(62-77) 158/77 (12/19 0738) SpO2:  [93 %-99 %] 96 % (12/19 0325) Weight:  [57.9 kg] 57.9 kg (12/19 0325) Last BM Date : 12/08/23  Intake/Output from previous day: 12/18 0701 - 12/19 0700 In: 1485.8 [I.V.:735.8; IV Piggyback:650] Out: 1118 [Emesis/NG output:1050; Drains:68] Intake/Output this shift: No intake/output data recorded.  General appearance: alert, cooperative. NGT with bilious output. Looks much weaker today than yesterday and looks uncomfortable.  Resp: breathing comfortably on [place].  GI: soft, non distended.  Drains left remains bilious and murky. Right murky brown.  Incision without erythema or drainage.  Perc drain remains serous and transparent. Mild epigastric tenderness Extremities: extremities normal, atraumatic, no cyanosis or edema  Lab Results:  Recent Labs    12/07/23 1142 12/09/23 0938  WBC 14.5* 14.5*  HGB 9.6* 10.1*  HCT 31.2* 32.3*  PLT 424* 339    BMET Recent Labs    12/09/23 0938  NA 137  K 3.6  CL 111  CO2 17*  GLUCOSE 124*  BUN 17  CREATININE 0.43*  CALCIUM 8.5*   PT/INR No results for input(s): "LABPROT", "INR" in the last 72 hours.   ABG No results for input(s): "PHART", "HCO3" in the last 72 hours.  Invalid input(s): "PCO2", "PO2"   Studies/Results: VAS [place] UPPER EXTREMITY VENOUS DUPLEX Result Date: 12/07/2023 UPPER VENOUS STUDY  Patient Name:  KASHAY SWIGERT  Date of Exam:   12/07/2023 Medical Rec #: [id number]       Accession #:    [id number] Date of Birth: 1936-04-06      Patient Gender: F Patient Age:   31 years Exam Location:  [facility] Procedure:      VAS [place] UPPER EXTREMITY VENOUS  DUPLEX Referring Phys: CHELSEA CONNOR --------------------------------------------------------------------------------  Indications: Swelling Risk Factors: Immobility. Comparison Study: None Performing Technologist: Shona Simpson  Examination Guidelines: A complete evaluation includes B-mode imaging, spectral Doppler, color Doppler, and power Doppler as needed of all accessible portions of each vessel. Bilateral testing is considered an integral part of a complete examination. Limited examinations for reoccurring indications may be performed as noted.  Right Findings: +----------+------------+---------+-----------+----------+-------+ RIGHT     CompressiblePhasicitySpontaneousPropertiesSummary +----------+------------+---------+-----------+----------+-------+ IJV           Full       Yes       Yes                      +----------+------------+---------+-----------+----------+-------+ Subclavian  Partial      Yes       Yes               Acute  +----------+------------+---------+-----------+----------+-------+ Axillary    Partial      Yes       Yes               Acute  +----------+------------+---------+-----------+----------+-------+ Brachial      Full       Yes       Yes                      +----------+------------+---------+-----------+----------+-------+ Radial        Full  Yes       Yes                      +----------+------------+---------+-----------+----------+-------+ Ulnar         Full       Yes       Yes                      +----------+------------+---------+-----------+----------+-------+ Cephalic      Full       Yes       Yes                      +----------+------------+---------+-----------+----------+-------+ Basilic       None       No        No                Acute  +----------+------------+---------+-----------+----------+-------+ Acute Thrombosis surrounding PICC line from Basilic V, through Axial V, and into the Subclavian V.   Left Findings: +----------+------------+---------+-----------+----------+-------+ LEFT      CompressiblePhasicitySpontaneousPropertiesSummary +----------+------------+---------+-----------+----------+-------+ Subclavian    Full       Yes       Yes                      +----------+------------+---------+-----------+----------+-------+  Summary:  Right: Findings consistent with acute deep vein thrombosis involving the right subclavian vein and right axillary vein. Findings consistent with acute superficial vein thrombosis involving the right basilic vein. Acute Thrombosis surrounding PICC line from Basilic V, through Axial V, and into the Subclavian V.  Left: No evidence of thrombosis in the subclavian.  *See table(s) above for measurements and observations.  Diagnosing physician: Lemar Livings MD Electronically signed by Lemar Livings MD on 12/07/2023 at 1:37:50 PM.    Final      Anti-infectives: Anti-infectives (From admission, onward)    Start     Dose/Rate Route Frequency Ordered Stop   11/30/23 1230  fluconazole (DIFLUCAN) IVPB 400 mg        400 mg 100 mL/hr over 120 Minutes Intravenous Every 24 hours 11/30/23 1135     11/26/23 0945  piperacillin-tazobactam (ZOSYN) IVPB 3.375 g        3.375 g 12.5 mL/hr over 240 Minutes Intravenous Every 8 hours 11/26/23 0853     11/23/23 2100  ceFAZolin (ANCEF) IVPB 2g/100 mL premix        2 g 200 mL/hr over 30 Minutes Intravenous Every 8 hours 11/23/23 1654 11/23/23 2214   11/23/23 0700  ceFAZolin (ANCEF) IVPB 2g/100 mL premix        2 g 200 mL/hr over 30 Minutes Intravenous On call to O.R. 11/23/23 0654 11/23/23 1249       Assessment/Plan: s/p Procedure(s) with comments: WHIPPLE PROCEDURE (N/A) 11/23/23 POD 16   Final pathology - adenocarcinoma duodenum, positive margin, positive LN - pT3pN2cM0,   HOCM - continue beta blockade and treat hypertension.   Anemia of chronic disease, chronic blood loss anemia, and acute blood loss anemia -  stable. Recheck today.  Hyperglycemia - continue to check fingersticks.   Hypophosphatemia - recheck thursday Hypokalemia - recheck thursday Severe protein calorie malnutrition - TNA, nutrition consult.   Pancreatic leak - Continue Zosyn/Fluconazole. Leukocytosis improving.  Drain output coming down.    Delayed gastric emptying. Qtc ok.  Continue reglan. Was hoping to get NGT out today as the output had been going down, but don't want to do this  while patient is nauseated.  NGT blue port flushed with air and is working well.    Abdominal fluid collection - CT aspiration and drain placement 12/13 (anterior hepatic fluid collection) - serosang. Gram stain - no organisms; NGTD  Pain control with prn tylenol, prn robaxin, prn fentanyl.  OOB, IS, PT consult.  VTE - lovenox treatment dose. Keep picc.    SW consult for placement.   LOS: 16 days   Maudry Diego, MD, FACS, FSSO Surgical Oncology, General Surgery, Trauma and Critical [facility] Surgery, [place] [phone] for weekday/non holidays Check [email or web address] for coverage night/weekend/holidays

## 2023-12-09 NOTE — Progress Notes (Signed)
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 post-op subcapsular fluid collection around the anterior aspect of the left liver lobe. Drain placement in IR 12/03/23. NGT inserted 12/15, dislodged then replaced.  Positive for R subclavian and axillary DVT on 12/07/23. 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   OT comments  Limited progress on this date due to patient not feeling well. Patient received seated in recliner and stating she was not feeling well but willing to participate. Patient able to perform grooming tasks seated with setup and performed standing from recliner but declined further mobility. Patient declined getting back to bed and asked to stay up in recliner. Patient will benefit from continued inpatient follow up therapy, <3 hours/day to continue to address transfers, bathing, and dressing. Acute OT to continue to follow to further address LB dressing.       If plan is discharge home, recommend the following:  A lot of help with bathing/dressing/bathroom;A little help with walking and/or transfers;Assist for transportation;Assistance with cooking/housework;Help with stairs or ramp for entrance   Equipment Recommendations  BSC/3in1    Recommendations for Other Services      Precautions / Restrictions Precautions Precautions: Fall Precaution Comments: x2 blake drains, x1 JP drain; NGT Restrictions Weight Bearing Restrictions Per Provider Order: No       Mobility Bed Mobility Overal bed mobility: Needs Assistance             General bed mobility comments: OOB in recliner    Transfers Overall transfer level: Needs assistance Equipment used: Rolling walker (2 wheels) Transfers: Sit to/from  Stand Sit to Stand: Min assist           General transfer comment: static standing performed from recliner, patient declined further mobility due to not feeling well     Balance Overall balance assessment: Needs assistance Sitting-balance support: Feet supported Sitting balance-[name]: Fair     Standing balance support: Bilateral upper extremity supported, Single extremity supported Standing balance-[name]: Poor Standing balance comment: able to perform reaching tasks while standing                           ADL either performed or assessed with clinical judgement   ADL Overall ADL's : Needs assistance/impaired     Grooming: Wash/dry hands;Wash/dry face;Oral care;Set up;Sitting Grooming Details (indicate cue type and reason): sitting in recliner                               General ADL Comments: needs further education on compensatory strategies for LB ADL    Extremity/Trunk Assessment              Vision       Perception     Praxis      Cognition Arousal: Alert Behavior During Therapy: Flat affect Overall Cognitive Status: Impaired/Different from baseline Area of Impairment: Memory, Safety/judgement, Problem solving                     Memory: Decreased short-term memory   Safety/Judgement: Decreased awareness of deficits   Problem Solving: Slow processing General Comments: patient motivated to participate but limited due to pain  Exercises      Shoulder Instructions       General Comments      Pertinent Vitals/ Pain       Pain Assessment Pain Assessment: 0-10 Pain Score: 5  Pain Location: abdomen Pain Descriptors / Indicators: Discomfort, Grimacing, Operative site guarding Pain Intervention(s): Limited activity within patient's tolerance, Monitored during session, Repositioned, RN gave pain meds during session  Home Living                                           Prior Functioning/Environment              Frequency  Min 1X/week        Progress Toward Goals  OT Goals(current goals can now be found in the care plan section)  Progress towards OT goals: Progressing toward goals  Acute Rehab OT Goals Patient Stated Goal: feel better OT Goal Formulation: With patient Time For Goal Achievement: 12/10/23 Potential to Achieve Goals: Good ADL Goals Pt Will Perform Grooming: with supervision;standing Pt Will Perform Upper Body Bathing: with set-up;sitting Pt Will Perform Lower Body Bathing: sit to/from stand;with contact guard assist Pt Will Perform Lower Body Dressing: with contact guard assist;sit to/from stand Pt Will Transfer to Toilet: with supervision;ambulating;regular height toilet  Plan      Co-evaluation                 AM-PAC OT "6 Clicks" Daily Activity     Outcome Measure   Help from another person eating meals?: A Little Help from another person taking care of personal grooming?: A Little Help from another person toileting, which includes using toliet, bedpan, or urinal?: A Lot Help from another person bathing (including washing, rinsing, drying)?: A Lot Help from another person to put on and taking off regular upper body clothing?: A Little Help from another person to put on and taking off regular lower body clothing?: A Lot 6 Click Score: 15    End of Session Equipment Utilized During Treatment: Rolling walker (2 wheels)  OT Visit Diagnosis: Unsteadiness on feet (R26.81);Muscle weakness (generalized) (M62.81)   Activity Tolerance Other (comment) (not feeling well)   Patient Left in chair;with call bell/phone within reach;with family/visitor present   Nurse Communication Mobility status        Time: 6213-0865 OT Time Calculation (min): 21 min  Charges: OT General Charges $OT Visit: 1 Visit OT Treatments $Self Care/Home Management : 8-22 mins  Alfonse Flavors, OTA Acute Rehabilitation Services  Office  [phone]   Dewain Penning 12/09/2023, 10:43 AM

## 2023-12-09 NOTE — Progress Notes (Signed)
PHARMACY - TOTAL PARENTERAL NUTRITION CONSULT NOTE  Indication:  delayed gastric emptying  Patient Measurements: Height: 4\' 10"  (147.3 cm) Weight: 57.9 kg (127 lb 10.3 oz) IBW/kg (Calculated) : 40.9 TPN AdjBW (KG): 44.5 Body mass index is 26.68 kg/m. 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  Patient was started on a CLD on 12/6-12/9 and then advanced to a soft diet since 12/10.  Patient has had minimal PO intake since being post-op and continues to experience nausea. Delayed gastric emptying likely related to possible pancreatic leak.  Pharmacy consulted to dose TPN.  Glucose / Insulin: A1c 3.7 (11/15/23) - CBGs < 180. SSI D/C'ed 12/08/23. Electrolytes:  all WNL except low CO2 Renal: SCr < 1, BUN WNL Hepatic: LFTs / tbili WNL, albumin 2.4 Intake / Output; MIVF: UOP not charted, NG , drain 68mL, BM x7 on 12/18 (s/p Reglan x3 doses 12/11-12/12, restarted 12/15) GI Imaging: 12/07 Ab XR: unremarkable bowel gas pattern. 12/11 CT: possible post-op seroma or hematoma vs abscess, distal colonic diverticulosis 12/15 Ab XR: expected post-op changes, NGT in correct place GI Surgeries / Procedures:  12/3 diagnostic laparoscopy, Whipple procedure, pancreatic duct stent 12/13 subhepatic fluid collection aspiration and drainage  Central access: PICC placed 12/01/23 TPN start date: 12/02/23  Nutritional Goals: Clinimix E 8/10 1L at 42 ml/hr on Sun/Tues/Wed/Thurs/Fri/Sat Clinimix E 8/10 2L at 83 ml/hr on Mon SMOF lipids 250 ml daily  Provides 100% AA goal and ~95% of kCal goal  Electrolytes in Clinimix E 1L bag: Na , K , Mag , Ca 4.39mEq, Phos , Ac , Cl  Electrolytes in Clnimix E 8/10 2L bag: Na 70 mEq, K 60 mEq, Mag 10 mEq, Ca 9 mEq, Phos 30 mmol, Acetate 166 , Cl 152 mEq   RD Estimated Needs Total Energy  Estimated Needs: 1400-1700 kcal/d Total Protein Estimated Needs: 65-80g/d Total Fluid Estimated Needs: 1.5-1.7L/d  Current Nutrition:  TPN Ensure Enlive TID - none charted given as patient is NPO  Plan:  Clinimix E 8/10 1L at 42 ml/hr on Sun/Tues/Wed/Thurs/Fri/Sat Clinimix E 8/10 2L at 83 ml/hr on Mon only SMOFlipid daily  Provides a weekly average of 1257 kCal and 92g AA per day, meeting 90% of minimal kCal and 115% of protein needs Add MVI and trace elements to TPN as haven't been taking PO MVI d/t NPO KCL x 3 runs Monitor TPN labs on Mon/Thurs - consider repeating labs on 12/21 F/U ability to advance diet back from NPO   Janisse Ghan D. Laney Potash, PharmD, BCPS, BCCCP 12/09/2023, 12:07 PM

## 2023-12-09 NOTE — Progress Notes (Signed)
   Pt resting comfortably    REmains in SR   BP 140s to 160s     -HOCM   Vigorous LV function with some outflow obstruction. Avoid dehydration  Avoid vasodilators       -HTN  BP is improved   Still a little high    Will change dosing of metoprolol to every 4 hours    Watch BP    When taking po transition to oral  - MR  Will follow over time      Will sign off for now   Please call with questions      Signed, Dietrich Pates, MD  12/09/2023, 11:11 AM

## 2023-12-10 LAB — GLUCOSE, CAPILLARY
Glucose-Capillary: 129 mg/dL — ABNORMAL HIGH (ref 70–99)
Glucose-Capillary: 112 mg/dL — ABNORMAL HIGH (ref 70–99)
Glucose-Capillary: 120 mg/dL — ABNORMAL HIGH (ref 70–99)
Glucose-Capillary: 100 mg/dL — ABNORMAL HIGH (ref 70–99)
Glucose-Capillary: 104 mg/dL — ABNORMAL HIGH (ref 70–99)

## 2023-12-10 MED ORDER — FAT EMUL FISH OIL/PLANT BASED 20% (SMOFLIPID)IV EMUL
250.0000 mL | INTRAVENOUS | Status: AC
Start: 2023-12-10 — End: 2023-12-11
  Administered 2023-12-10: 250 mL via INTRAVENOUS
  Filled 2023-12-10: qty 250

## 2023-12-10 MED ORDER — TRACE MINERALS CU-MN-SE-ZN 300-55-60-3000 MCG/ML IV SOLN
INTRAVENOUS | Status: AC
  Filled 2023-12-10 (×2): qty 1000

## 2023-12-10 NOTE — Progress Notes (Signed)
17 Days Post-Op   Subjective/Chief Complaint: No nausea today.  Has been OOB in chair for a while at this point.  Also is having less pain today.   Objective: Vital signs in last 24 hours: Temp:  [97.6 F (36.4 C)-98.6 F (37 C)] 97.9 F (36.6 C) (12/20 1157) Pulse Rate:  [70-83] 76 (12/20 1157) Resp:  [16-23] 20 (12/20 1157) BP: (139-175)/(54-72) 139/54 (12/20 1157) SpO2:  [94 %-100 %] 94 % (12/20 1157) Last BM Date : 12/09/23  Intake/Output from previous day: 12/19 0701 - 12/20 0700 In: 1019.4 [I.V.:528; IV Piggyback:486.3] Out: 967 [Emesis/NG output:750; Drains:217] Intake/Output this shift: Total I/O In: 10 [I.V.:5; Other:5] Out: 174 [Emesis/NG output:100; Drains:74]  General appearance: alert, cooperative. Looks overall better than yesterday.  Resp: breathing comfortably GI: soft, non distended.  Drains left murky. Right murky brown.  Incision without erythema or drainage.  Perc drain remains serous and transparent. Mild epigastric tenderness Extremities: extremities normal, atraumatic, no cyanosis or edema  Lab Results:  Recent Labs    12/09/23 0938  WBC 14.5*  HGB 10.1*  HCT 32.3*  PLT 339    BMET Recent Labs    12/09/23 0938  NA 137  K 3.6  CL 111  CO2 17*  GLUCOSE 124*  BUN 17  CREATININE 0.43*  CALCIUM 8.5*   PT/INR No results for input(s): "LABPROT", "INR" in the last 72 hours.   ABG No results for input(s): "PHART", "HCO3" in the last 72 hours.  Invalid input(s): "PCO2", "PO2"   Studies/Results: No results found.    Anti-infectives: Anti-infectives (From admission, onward)    Start     Dose/Rate Route Frequency Ordered Stop   11/30/23 1230  fluconazole (DIFLUCAN) IVPB 400 mg        400 mg 100 mL/hr over 120 Minutes Intravenous Every 24 hours 11/30/23 1135     11/26/23 0945  piperacillin-tazobactam (ZOSYN) IVPB 3.375 g        3.375 g 12.5 mL/hr over 240 Minutes Intravenous Every 8 hours 11/26/23 0853     11/23/23 2100   ceFAZolin (ANCEF) IVPB 2g/100 mL premix        2 g 200 mL/hr over 30 Minutes Intravenous Every 8 hours 11/23/23 1654 11/23/23 2214   11/23/23 0700  ceFAZolin (ANCEF) IVPB 2g/100 mL premix        2 g 200 mL/hr over 30 Minutes Intravenous On call to O.R. 11/23/23 0654 11/23/23 1249       Assessment/Plan: s/p Procedure(s) with comments: WHIPPLE PROCEDURE (N/A) 11/23/23 POD 17   Final pathology - adenocarcinoma duodenum, positive margin, positive LN - pT3pN2cM0,   HOCM - continue beta blockade and treat hypertension.  Cardiology increased IV metoprolol to q4h instead of q6.   Anemia of chronic disease, chronic blood loss anemia, and acute blood loss anemia - stable. Monitor, recheck tomorrow.  Hyperglycemia - improved.  continue to check fingersticks due to TPN.  Hypophosphatemia - resolved on last check, getting checked M/Th for TPN labs.   Hypokalemia - resolved Severe protein calorie malnutrition - TNA  Pancreatic leak - Continue Zosyn/Fluconazole. Leukocytosis stalled at 14k.  Drain output coming down.    Delayed gastric emptying. Qtc ok.  Continue reglan. Clamped NGT today.    Abdominal fluid collection - CT aspiration and drain placement 12/13 (anterior hepatic fluid collection) - serosang. Rare candida seen on final culture.    Pain control with prn tylenol, prn robaxin, prn fentanyl.  OOB, IS, PT consult.  VTE - lovenox treatment  dose. Keep picc.    SW consult for placement.   LOS: 17 days   Maudry Diego, MD, FACS, FSSO Surgical Oncology, General Surgery, Trauma and Critical [facility] Surgery, [place] [phone] for weekday/non holidays Check [email or web address] for coverage night/weekend/holidays

## 2023-12-10 NOTE — Progress Notes (Signed)
Physical Therapy Treatment Patient Details Name: Christine Morse MRN: [id number] DOB: 17-Aug-1936 Today's Date: 12/10/2023   History of Present Illness Patient is an 87 y/o female admitted 11/23/23 with diagnosis of duodenal adenoma and underwent laparoscopic Whipple procedure. post-op subcapsular fluid collection around the anterior aspect of the left liver lobe. Drain placement in IR 12/03/23. NGT inserted 12/15, dislodged then replaced.  Positive for R subclavian and axillary DVT on 12/07/23. 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    PT Comments  Pt received in recliner, agreeable to therapy session and with good participation and fair tolerance for seated/standing BLE exercises. Pt defers gait trial after exercises due to c/o fatigue and reports 5/10 modified RPE (fatigue) at end of session. BP somewhat elevated, reading BP 181/68 (101) seated prior to standing and BP 173/72 (101 standing at bedside. BP 172/68 (99) seated back in chair after standing exercises. Pt needing up to minA for transfers and pre-gait tasks at [facility] and not able to progress gait training this date. Patient will benefit from continued inpatient follow up therapy, <3 hours/day     If plan is discharge home, recommend the following: A little help with walking and/or transfers;Assistance with cooking/housework;Assist for transportation;Help with stairs or ramp for entrance;A little help with bathing/dressing/bathroom   Can travel by private vehicle     Yes  Equipment Recommendations  Rolling walker (2 wheels);BSC/3in1    Recommendations for Other Services       Precautions / Restrictions Precautions Precautions: Fall Precaution Comments: x2 blake drains, x1 JP drain; NGT off suction Restrictions Weight Bearing Restrictions Per Provider Order: No     Mobility  Bed Mobility Overal bed mobility: Needs Assistance             General bed mobility comments: OOB in recliner     Transfers Overall transfer level: Needs assistance Equipment used: Rolling walker (2 wheels) Transfers: Sit to/from Stand Sit to Stand: Min assist           General transfer comment: static standing and hip flexion in front of chair; pt defers gait trial due to fatigue after seated/standing exercises    Ambulation/Gait Ambulation/Gait assistance: Min assist   Assistive device: Rolling walker (2 wheels)       Pre-gait activities: standing hip flexion x10 reps ea     Stairs             Wheelchair Mobility     Tilt Bed    Modified Rankin (Stroke Patients Only)       Balance Overall balance assessment: Needs assistance Sitting-balance support: Feet supported Sitting balance-[name]: Fair     Standing balance support: Bilateral upper extremity supported, Single extremity supported Standing balance-[name]: Poor Standing balance comment: pt defers to attempt stand without RW when asked if she would like to try; needs external support at LUE when standing BP taken and pt was reluctant to let go of RW on one side                            Cognition Arousal: Alert Behavior During Therapy: Flat affect Overall Cognitive Status: Impaired/Different from baseline Area of Impairment: Memory, Safety/judgement, Problem solving                     Memory: Decreased short-term memory   Safety/Judgement: Decreased awareness of deficits   Problem Solving: Slow processing General Comments: patient motivated  to participate but limited due to pain/fatigue after seated/standing exercises.        Exercises Other Exercises Other Exercises: seated BLE AROM: hip flexion, LAQ x10 reps ea Other Exercises: STS x 1 and standing hip flexion x10 reps ea at RW Other Exercises: too fatigued when pt encouraged to attempt reciprocal sit to stand exercises    General Comments General comments (skin integrity, edema, etc.): x3 drains and NGT in place  at beginning and end of session, IV running and in place; O2 Calcium on her nose but not plugged in to wall outlet, SpO2 WFL on RA and NT notified it was not plugged in and [facility] with activity; BP elevated but stable with sit>stand changes      Pertinent Vitals/Pain Pain Assessment Pain Assessment: Faces Faces Pain Scale: Hurts little more Pain Location: abdomen with activity, mostly on her L side Pain Descriptors / Indicators: Discomfort, Grimacing, Operative site guarding Pain Intervention(s): Limited activity within patient's tolerance, Monitored during session, Repositioned, Other (comment) (pt denies pain at rest)    Home Living                          Prior Function            PT Goals (current goals can now be found in the care plan section) Acute Rehab PT Goals Patient Stated Goal: return to independent PT Goal Formulation: With patient/family Time For Goal Achievement: 12/22/23 Progress towards PT goals: Progressing toward goals (slowly)    Frequency    Min 1X/week      PT Plan      Co-evaluation              AM-PAC PT "6 Clicks" Mobility   Outcome Measure  Help needed turning from your back to your side while in a flat bed without using bedrails?: A Little Help needed moving from lying on your back to sitting on the side of a flat bed without using bedrails?: A Little Help needed moving to and from a bed to a chair (including a wheelchair)?: A Little Help needed standing up from a chair using your arms (e.g., wheelchair or bedside chair)?: A Little Help needed to walk in hospital room?: A Lot (due to lines/pt fatigue) Help needed climbing 3-5 steps with a railing? : Total 6 Click Score: 15    End of Session Equipment Utilized During Treatment: Other (comment) ([place] in her nares but wall O2 not attached; VSS on RA) Activity Tolerance: Patient limited by fatigue;Treatment limited secondary to medical complications (Comment);Other (comment) (BP  elevated) Patient left: in chair;with call bell/phone within reach;with chair alarm set Nurse Communication: Mobility status;Other (comment) (BP; on RA) PT Visit Diagnosis: Other abnormalities of gait and mobility (R26.89);Difficulty in walking, not elsewhere classified (R26.2);Pain     Time: 1741-1757 PT Time Calculation (min) (ACUTE ONLY): 16 min  Charges:    $Therapeutic Exercise: 8-22 mins PT General Charges $$ ACUTE PT VISIT: 1 Visit                     Jumar Greenstreet P., PTA Acute Rehabilitation Services Secure Chat Preferred 9a-5:30pm Office: [phone]    Dorathy Kinsman [facility] 12/10/2023, 7:06 PM

## 2023-12-10 NOTE — Progress Notes (Signed)
Pt NG remained clamped during shift and pt denies any nausea or discomfort. No vomiting witnessed or reported. NG tube intact, still clamped. Pt off since this am and on RA. Oxygenation remained in the 90's during shift with oxygenation at 98% RA. Reported off to oncoming RN. Dionne Bucy RN

## 2023-12-10 NOTE — Progress Notes (Signed)
PHARMACY - TOTAL PARENTERAL NUTRITION CONSULT NOTE  Indication:  delayed gastric emptying  Patient Measurements: Height: 4\' 10"  (147.3 cm) Weight: 57.9 kg (127 lb 10.3 oz) IBW/kg (Calculated) : 40.9 TPN AdjBW (KG): 44.5 Body mass index is 26.68 kg/m. 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  Patient was started on a CLD on 12/6-12/9 and then advanced to a soft diet since 12/10.  Patient has had minimal PO intake since being post-op and continues to experience nausea. Delayed gastric emptying likely related to possible pancreatic leak.  Pharmacy consulted to dose TPN.  Glucose / Insulin: A1c 3.7 (11/15/23) - CBGs < 180. SSI D/C'ed 12/08/23. Electrolytes: all WNL except low CO2 on 12/19 (K down to 3.6 and received 3 runs) Renal: SCr < 1, BUN WNL Hepatic: LFTs / tbili WNL, albumin 2.4 Intake / Output; MIVF: UOP not charted, NG , drain , BM x7 on 12/19 (s/p Reglan x3 doses 12/11-12/12, restarted 12/15) GI Imaging: 12/07 Ab XR: unremarkable bowel gas pattern. 12/11 CT: possible post-op seroma or hematoma vs abscess, distal colonic diverticulosis 12/15 Ab XR: expected post-op changes, NGT in correct place GI Surgeries / Procedures:  12/3 diagnostic laparoscopy, Whipple procedure, pancreatic duct stent 12/13 subhepatic fluid collection aspiration and drainage  Central access: PICC placed 12/01/23 TPN start date: 12/02/23  Nutritional Goals: Clinimix E 8/10 1L at 42 ml/hr on Sun/Tues/Wed/Thurs/Fri/Sat Clinimix E 8/10 2L at 83 ml/hr on Mon SMOF lipids 250 ml daily  Provides 100% AA goal and ~95% of kCal goal  Electrolytes in Clinimix E 1L bag: Na , K , Mag , Ca 4.41mEq, Phos , Ac , Cl  Electrolytes in Clnimix E 8/10 2L bag: Na 70 mEq, K 60 mEq, Mag 10 mEq, Ca 9 mEq, Phos 30 mmol, Acetate 166 , Cl  152 mEq   RD Estimated Needs Total Energy Estimated Needs: 1400-1700 kcal/d Total Protein Estimated Needs: 65-80g/d Total Fluid Estimated Needs: 1.5-1.7L/d  Current Nutrition:  TPN  Plan:  Clinimix E 8/10 1L at 42 ml/hr on Sun/Tues/Wed/Thurs/Fri/Sat Clinimix E 8/10 2L at 83 ml/hr on Mon only SMOFlipid daily  Provides a weekly average of 1257 kCal and 92g AA per day, meeting 90% of minimal kCal and 115% of protein needs Add MVI and trace elements to TPN as haven't been taking PO MVI d/t NPO Monitor TPN labs on Mon/Thurs - BMET and Phos in AM F/U ability to advance diet back from NPO   Ayahna Solazzo D. Laney Potash, PharmD, BCPS, BCCCP 12/10/2023, 7:01 AM

## 2023-12-10 NOTE — Progress Notes (Signed)
Inpatient Rehabilitation Admissions Coordinator   I continue to follow her progress. Noted NGT clamped and remains on TNA. Is LTACH an option for prolonged recuperation with her TNA? Not CIR appropriate at this time .  Ottie Glazier, RN, MSN Rehab Admissions Coordinator [phone] 12/10/2023 2:35 PM

## 2023-12-11 LAB — CBC
HCT: 28.4 % — ABNORMAL LOW (ref 36.0–46.0)
Hemoglobin: 9.1 g/dL — ABNORMAL LOW (ref 12.0–15.0)
MCH: 30.5 pg (ref 26.0–34.0)
MCHC: 32 g/dL (ref 30.0–36.0)
MCV: 95.3 fL (ref 80.0–100.0)
Platelets: 315 10*3/uL (ref 150–400)
RBC: 2.98 MIL/uL — ABNORMAL LOW (ref 3.87–5.11)
RDW: 15.2 % (ref 11.5–15.5)
WBC: 10.1 10*3/uL (ref 4.0–10.5)
nRBC: 0 % (ref 0.0–0.2)

## 2023-12-11 LAB — GLUCOSE, CAPILLARY
Glucose-Capillary: 102 mg/dL — ABNORMAL HIGH (ref 70–99)
Glucose-Capillary: 105 mg/dL — ABNORMAL HIGH (ref 70–99)
Glucose-Capillary: 106 mg/dL — ABNORMAL HIGH (ref 70–99)
Glucose-Capillary: 108 mg/dL — ABNORMAL HIGH (ref 70–99)
Glucose-Capillary: 128 mg/dL — ABNORMAL HIGH (ref 70–99)
Glucose-Capillary: 99 mg/dL (ref 70–99)

## 2023-12-11 LAB — BASIC METABOLIC PANEL
Anion gap: 9 (ref 5–15)
BUN: 13 mg/dL (ref 8–23)
CO2: 22 mmol/L (ref 22–32)
Calcium: 7.8 mg/dL — ABNORMAL LOW (ref 8.9–10.3)
Chloride: 109 mmol/L (ref 98–111)
Creatinine, Ser: 0.33 mg/dL — ABNORMAL LOW (ref 0.44–1.00)
GFR, Estimated: >60 mL/min (ref >60)
Glucose, Bld: 128 mg/dL — ABNORMAL HIGH (ref 70–99)
Potassium: 2.8 mmol/L — ABNORMAL LOW (ref 3.5–5.1)
Sodium: 140 mmol/L (ref 135–145)

## 2023-12-11 LAB — PHOSPHORUS: Phosphorus: 2.4 mg/dL — ABNORMAL LOW (ref 2.5–4.6)

## 2023-12-11 MED ORDER — POTASSIUM PHOSPHATES 15 MMOLE/5ML IV SOLN
30.0000 mmol | Freq: Once | INTRAVENOUS | Status: AC
  Administered 2023-12-11: 30 mmol via INTRAVENOUS
  Filled 2023-12-11: qty 10

## 2023-12-11 MED ORDER — POTASSIUM CHLORIDE 10 MEQ/50ML IV SOLN
10.0000 meq | INTRAVENOUS | Status: AC
  Administered 2023-12-11 (×4): 10 meq via INTRAVENOUS
  Filled 2023-12-11 (×4): qty 50

## 2023-12-11 MED ORDER — TRACE MINERALS CU-MN-SE-ZN 300-55-60-3000 MCG/ML IV SOLN
INTRAVENOUS | Status: AC
  Filled 2023-12-11 (×2): qty 1000

## 2023-12-11 MED ORDER — FAT EMUL FISH OIL/PLANT BASED 20% (SMOFLIPID)IV EMUL
250.0000 mL | INTRAVENOUS | Status: AC
  Administered 2023-12-11: 250 mL via INTRAVENOUS
  Filled 2023-12-11: qty 250

## 2023-12-11 NOTE — Progress Notes (Signed)
18 Days Post-Op   Subjective/Chief Complaint: No nausea today.  NG has been clamped for 24h.  Having bowel function.  Min residual when returned to [facility].  Has been OOB in chair for a while at this point.  Also is having less pain today.   Objective: Vital signs in last 24 hours: Temp:  [97 F (36.1 C)-98 F (36.7 C)] 97 F (36.1 C) (12/21 0700) Pulse Rate:  [71-83] 83 (12/21 0310) Resp:  [17-22] 22 (12/21 0310) BP: (139-176)/(54-72) 173/56 (12/21 0700) SpO2:  [94 %-98 %] 95 % (12/21 0310) Weight:  [57.9 kg] 57.9 kg (12/21 0500) Last BM Date : 12/09/23  Intake/Output from previous day: 12/20 0701 - 12/21 0700 In: 2595.5 [I.V.:1618.3; IV Piggyback:967.2] Out: 395 [Emesis/NG output:100; Drains:295] Intake/Output this shift: No intake/output data recorded.  General appearance: alert, cooperative. Looks overall better than yesterday.  Resp: breathing comfortably GI: soft, non distended.  Drains left murky. Right murky.  Incision without erythema or drainage.  Perc drain remains serous and transparent. Mild epigastric tenderness Extremities: extremities normal, atraumatic, no cyanosis or edema  Lab Results:  Recent Labs    12/09/23 0938 12/11/23 0425  WBC 14.5* 10.1  HGB 10.1* 9.1*  HCT 32.3* 28.4*  PLT 339 315    BMET Recent Labs    12/09/23 0938 12/11/23 0425  NA 137 140  K 3.6 2.8*  CL 111 109  CO2 17* 22  GLUCOSE 124* 128*  BUN 17 13  CREATININE 0.43* 0.33*  CALCIUM 8.5* 7.8*   PT/INR No results for input(s): "LABPROT", "INR" in the last 72 hours.   ABG No results for input(s): "PHART", "HCO3" in the last 72 hours.  Invalid input(s): "PCO2", "PO2"   Studies/Results: No results found.    Anti-infectives: Anti-infectives (From admission, onward)    Start     Dose/Rate Route Frequency Ordered Stop   11/30/23 1230  fluconazole (DIFLUCAN) IVPB 400 mg        400 mg 100 mL/hr over 120 Minutes Intravenous Every 24 hours 11/30/23 1135     11/26/23 0945   piperacillin-tazobactam (ZOSYN) IVPB 3.375 g        3.375 g 12.5 mL/hr over 240 Minutes Intravenous Every 8 hours 11/26/23 0853     11/23/23 2100  ceFAZolin (ANCEF) IVPB 2g/100 mL premix        2 g 200 mL/hr over 30 Minutes Intravenous Every 8 hours 11/23/23 1654 11/23/23 2214   11/23/23 0700  ceFAZolin (ANCEF) IVPB 2g/100 mL premix        2 g 200 mL/hr over 30 Minutes Intravenous On call to O.R. 11/23/23 0654 11/23/23 1249       Assessment/Plan: s/p Procedure(s) with comments: WHIPPLE PROCEDURE (N/A) 11/23/23 POD 18   Final pathology - adenocarcinoma duodenum, positive margin, positive LN - pT3pN2cM0,   HOCM - continue beta blockade and treat hypertension.   IV metoprolol to q4h instead of q6 per Cardiology.   Anemia of chronic disease, chronic blood loss anemia, and acute blood loss anemia - stable. Monitor, recheck tomorrow.  Hyperglycemia - improved.  continue to check fingersticks due to TPN.  Hypophosphatemia - resolved on last check, getting checked M/Th for TPN labs.   Hypokalemia - resolved Severe protein calorie malnutrition - TNA  Pancreatic leak - Continue Zosyn/Fluconazole. WBC normalized today.  Drain output coming down.    Delayed gastric emptying. Qtc ok.  Continue reglan. D/C NGT today.    Abdominal fluid collection - CT aspiration and drain placement 12/13 (  anterior hepatic fluid collection) - serosang. Rare candida seen on final culture.    Pain control with prn tylenol, prn robaxin, prn fentanyl.  OOB, IS, PT consult.  VTE - lovenox treatment dose. Keep picc.    SW consult for placement.   LOS: 18 days   Vanita Panda, MD  Colorectal and General Surgery [facility]

## 2023-12-11 NOTE — Progress Notes (Signed)
Pt NG tube removed per order. Dionne Bucy RN

## 2023-12-11 NOTE — Progress Notes (Signed)
PHARMACY - TOTAL PARENTERAL NUTRITION CONSULT NOTE  Indication:  delayed gastric emptying  Patient Measurements: Height: 4\' 10"  (147.3 cm) Weight: 57.9 kg (127 lb 10.3 oz) IBW/kg (Calculated) : 40.9 TPN AdjBW (KG): 44.5 Body mass index is 26.68 kg/m. 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  Patient was started on a CLD on 12/6-12/9 and then advanced to a soft diet since 12/10.  Patient has had minimal PO intake since being post-op and continues to experience nausea. Delayed gastric emptying likely related to possible pancreatic leak.  Pharmacy consulted to dose TPN.  Glucose / Insulin: A1c 3.7 (11/15/23) - CBGs < 180. SSI D/C'ed 12/08/23. Electrolytes: Na 140, K 2.8, Ch 109, Phos 2.4 Renal: SCr < 1, BUN WNL Hepatic: LFTs / tbili WNL, albumin 2.4 Intake / Output; MIVF: UOP not charted (6 unmeasured occurences), NG 100 ml, drain 295 mL, BM x7 on 12/19 (s/p Reglan x3 doses 12/11-12/12, restarted 12/15) GI Imaging: 12/07 Ab XR: unremarkable bowel gas pattern. 12/11 CT: possible post-op seroma or hematoma vs abscess, distal colonic diverticulosis 12/15 Ab XR: expected post-op changes, NGT in correct place GI Surgeries / Procedures:  12/3 diagnostic laparoscopy, Whipple procedure, pancreatic duct stent 12/13 subhepatic fluid collection aspiration and drainage  Central access: PICC placed 12/01/23 TPN start date: 12/02/23  Nutritional Goals: Clinimix E 8/10 1L at 42 ml/hr on Sun/Tues/Wed/Thurs/Fri/Sat Clinimix E 8/10 2L at 83 ml/hr on Mon SMOF lipids 250 ml daily  Provides 100% AA goal and ~95% of kCal goal  Electrolytes in Clinimix E 1L bag: Na , K , Mag , Ca 4.52mEq, Phos , Ac , Cl  Electrolytes in Clnimix E 8/10 2L bag: Na 70 mEq, K 60 mEq, Mag 10 mEq, Ca 9 mEq, Phos 30 mmol, Acetate 166 , Cl 152 mEq    RD Estimated Needs Total Energy Estimated Needs: 1400-1700 kcal/d Total Protein Estimated Needs: 65-80g/d Total Fluid Estimated Needs: 1.5-1.7L/d  Current Nutrition:  TPN  Plan:  Clinimix E 8/10 1L at 42 ml/hr on Sun/Tues/Wed/Thurs/Fri/Sat Clinimix E 8/10 2L at 83 ml/hr on Mon only SMOFlipid daily  Provides a weekly average of 1257 kCal and 92g AA per day, meeting 90% of minimal kCal and 115% of protein needs Add MVI and trace elements to TPN as haven't been taking PO MVI d/t NPO Potassium 10 meq iv x4 Kphos 30 mmol iv x1 Monitor TPN labs on Mon/Thurs - BMET and Phos in AM F/U ability to advance diet back from NPO   Lillis Nuttle BS, PharmD, BCPS Clinical Pharmacist 12/11/2023 7:12 AM  Contact: [phone] after 3 PM  "Be curious, not judgmental..." -Debbora Dus

## 2023-12-11 NOTE — Progress Notes (Signed)
Pt remain stable during shift, scheduled antiemetic med effective, pt denies any pain or discomfort for the rest of the shift. No vomiting/emesis reported or witnessed. Report given to oncoming RN. Dionne Bucy RN

## 2023-12-11 NOTE — Progress Notes (Signed)
Pt report discomfort and nausea after drinking but not emesis/vomiting. Pt given her scheduled Reglan. Pt resting in bed with call light and family at bedside. Dionne Bucy RN

## 2023-12-12 LAB — BASIC METABOLIC PANEL
Anion gap: 7 (ref 5–15)
BUN: 12 mg/dL (ref 8–23)
CO2: 21 mmol/L — ABNORMAL LOW (ref 22–32)
Calcium: 8.3 mg/dL — ABNORMAL LOW (ref 8.9–10.3)
Chloride: 109 mmol/L (ref 98–111)
Creatinine, Ser: 0.55 mg/dL (ref 0.44–1.00)
GFR, Estimated: >60 mL/min (ref >60)
Glucose, Bld: 126 mg/dL — ABNORMAL HIGH (ref 70–99)
Potassium: 3.3 mmol/L — ABNORMAL LOW (ref 3.5–5.1)
Sodium: 137 mmol/L (ref 135–145)

## 2023-12-12 LAB — CBC
HCT: 28.2 % — ABNORMAL LOW (ref 36.0–46.0)
Hemoglobin: 8.9 g/dL — ABNORMAL LOW (ref 12.0–15.0)
MCH: 30.2 pg (ref 26.0–34.0)
MCHC: 31.6 g/dL (ref 30.0–36.0)
MCV: 95.6 fL (ref 80.0–100.0)
Platelets: 311 10*3/uL (ref 150–400)
RBC: 2.95 MIL/uL — ABNORMAL LOW (ref 3.87–5.11)
RDW: 15 % (ref 11.5–15.5)
WBC: 9.9 10*3/uL (ref 4.0–10.5)
nRBC: 0 % (ref 0.0–0.2)

## 2023-12-12 LAB — GLUCOSE, CAPILLARY
Glucose-Capillary: 104 mg/dL — ABNORMAL HIGH (ref 70–99)
Glucose-Capillary: 115 mg/dL — ABNORMAL HIGH (ref 70–99)

## 2023-12-12 LAB — PHOSPHORUS: Phosphorus: 2.6 mg/dL (ref 2.5–4.6)

## 2023-12-12 MED ORDER — FAT EMUL FISH OIL/PLANT BASED 20% (SMOFLIPID)IV EMUL
250.0000 mL | INTRAVENOUS | Status: AC
  Administered 2023-12-12: 250 mL via INTRAVENOUS
  Filled 2023-12-12: qty 250

## 2023-12-12 MED ORDER — POTASSIUM CHLORIDE 10 MEQ/50ML IV SOLN
10.0000 meq | INTRAVENOUS | Status: AC
  Administered 2023-12-12 (×4): 10 meq via INTRAVENOUS
  Filled 2023-12-12 (×4): qty 50

## 2023-12-12 MED ORDER — TRACE MINERALS CU-MN-SE-ZN 300-55-60-3000 MCG/ML IV SOLN
INTRAVENOUS | Status: AC
  Filled 2023-12-12: qty 1000

## 2023-12-12 NOTE — Progress Notes (Signed)
19 Days Post-Op   Subjective/Chief Complaint: Tolerating clear liquids, NG removed.  Having bowel function.  Excited to be closer to getting home.  Objective: Vital signs in last 24 hours: Temp:  [97.4 F (36.3 C)-99 F (37.2 C)] 98.3 F (36.8 C) (12/22 0305) Pulse Rate:  [72-79] 74 (12/22 0305) Resp:  [18-23] 22 (12/22 0305) BP: (146-160)/(53-71) 146/53 (12/22 0305) SpO2:  [94 %-97 %] 94 % (12/22 0305) Last BM Date : 12/11/23  Intake/Output from previous day: 12/21 0701 - 12/22 0700 In: 2149.8 [I.V.:1328; IV Piggyback:816.8] Out: 341 [Drains:341] Intake/Output this shift: No intake/output data recorded.  General appearance: alert, cooperative. Looks overall better than yesterday.  Resp: breathing comfortably GI: soft, non distended.  Drains left murky. Right murky.  Incision without erythema or drainage.  Perc drain remains serous and transparent. Mild epigastric tenderness Extremities: extremities normal, atraumatic, no cyanosis or edema  Lab Results:  Recent Labs    12/11/23 0425 12/12/23 0455  WBC 10.1 9.9  HGB 9.1* 8.9*  HCT 28.4* 28.2*  PLT 315 311    BMET Recent Labs    12/11/23 0425 12/12/23 0455  NA 140 137  K 2.8* 3.3*  CL 109 109  CO2 22 21*  GLUCOSE 128* 126*  BUN 13 12  CREATININE 0.33* 0.55  CALCIUM 7.8* 8.3*   PT/INR No results for input(s): "LABPROT", "INR" in the last 72 hours.   ABG No results for input(s): "PHART", "HCO3" in the last 72 hours.  Invalid input(s): "PCO2", "PO2"   Studies/Results: No results found.    Anti-infectives: Anti-infectives (From admission, onward)    Start     Dose/Rate Route Frequency Ordered Stop   11/30/23 1230  fluconazole (DIFLUCAN) IVPB 400 mg        400 mg 100 mL/hr over 120 Minutes Intravenous Every 24 hours 11/30/23 1135     11/26/23 0945  piperacillin-tazobactam (ZOSYN) IVPB 3.375 g        3.375 g 12.5 mL/hr over 240 Minutes Intravenous Every 8 hours 11/26/23 0853     11/23/23 2100   ceFAZolin (ANCEF) IVPB 2g/100 mL premix        2 g 200 mL/hr over 30 Minutes Intravenous Every 8 hours 11/23/23 1654 11/23/23 2214   11/23/23 0700  ceFAZolin (ANCEF) IVPB 2g/100 mL premix        2 g 200 mL/hr over 30 Minutes Intravenous On call to O.R. 11/23/23 0654 11/23/23 1249       Assessment/Plan: Ms. Kuchar is an 87 year old female status post Whipple Procedure on 11/23/2023, (POD 19).  Final pathology - adenocarcinoma duodenum, positive margin, positive LN - pT3pN2cM0,   HOCM - continue beta blockade and treat hypertension.   IV metoprolol to q4h instead of q6 per Cardiology.   Anemia of chronic disease, chronic blood loss anemia, and acute blood loss anemia - stable. Monitor, recheck tomorrow.  Hyperglycemia - improved 99-108 last 24 hours. Reduce finger stick frequency Hypophosphatemia - resolved on last check, getting checked M/Th for TPN labs.   Hypokalemia - replace today 3.3, ordered by Dr. Donell Beers at [phone] Severe protein calorie malnutrition - TNA  Pancreatic leak - Continue Zosyn/Fluconazole. Leukocytosis resolved.  Drain output 341 mL last 24 hours.    Delayed gastric emptying. Qtc ok.  Continue reglan. Tolerated clears yesterday, slow with diet per Dr. Donell Beers  Abdominal fluid collection - CT aspiration and drain placement 12/13 (anterior hepatic fluid collection) - serosang. Rare candida seen on final culture.    RUE  DVT - treatment dose lovenox, reviewed with vascular and will continue PICC for now  Pain control with prn tylenol, prn robaxin, prn fentanyl.  OOB, IS, PT consult.  VTE - lovenox treatment dose. Keep picc.    SW consult for placement.   LOS: 19 days   Quentin Ore, MD

## 2023-12-12 NOTE — Progress Notes (Signed)
PHARMACY - TOTAL PARENTERAL NUTRITION CONSULT NOTE  Indication:  delayed gastric emptying  Patient Measurements: Height: 4\' 10"  (147.3 cm) Weight: 57.9 kg (127 lb 10.3 oz) IBW/kg (Calculated) : 40.9 TPN AdjBW (KG): 44.5 Body mass index is 26.68 kg/m. 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  Patient was started on a CLD on 12/6-12/9 and then advanced to a soft diet since 12/10.  Patient has had minimal PO intake since being post-op and continues to experience nausea. Delayed gastric emptying likely related to possible pancreatic leak.  Pharmacy consulted to dose TPN.  Glucose / Insulin: A1c 3.7 (11/15/23) - CBGs < 180. SSI D/C'ed 12/08/23. Electrolytes: Na 137, K 3.3, Ch 109, Phos 2.6 Renal: SCr < 1, BUN WNL Hepatic: LFTs / tbili WNL, albumin 2.4 Intake / Output; MIVF: UOP not charted (7 unmeasured occurences), NG 100 ml, drain 341 mL, LBM 12/21 (s/p Reglan x3 doses 12/11-12/12, restarted 12/15) GI Imaging: 12/07 Ab XR: unremarkable bowel gas pattern. 12/11 CT: possible post-op seroma or hematoma vs abscess, distal colonic diverticulosis 12/15 Ab XR: expected post-op changes, NGT in correct place GI Surgeries / Procedures:  12/3 diagnostic laparoscopy, Whipple procedure, pancreatic duct stent 12/13 subhepatic fluid collection aspiration and drainage  Central access: PICC placed 12/01/23 TPN start date: 12/02/23  Nutritional Goals: Clinimix E 8/10 1L at 42 ml/hr on Sun/Tues/Wed/Thurs/Fri/Sat Clinimix E 8/10 2L at 83 ml/hr on Mon SMOF lipids 250 ml daily  Provides 100% AA goal and ~95% of kCal goal  Electrolytes in Clinimix E 1L bag: Na , K , Mag , Ca 4.71mEq, Phos , Ac , Cl  Electrolytes in Clnimix E 8/10 2L bag: Na 70 mEq, K 60 mEq, Mag 10 mEq, Ca 9 mEq, Phos 30 mmol, Acetate 166 , Cl 152 mEq   RD  Estimated Needs Total Energy Estimated Needs: 1400-1700 kcal/d Total Protein Estimated Needs: 65-80g/d Total Fluid Estimated Needs: 1.5-1.7L/d  Current Nutrition:  TPN  Plan:  Clinimix E 8/10 1L at 42 ml/hr on Sun/Tues/Wed/Thurs/Fri/Sat Clinimix E 8/10 2L at 83 ml/hr on Mon only SMOFlipid daily  Provides a weekly average of 1257 kCal and 92g AA per day, meeting 90% of minimal kCal and 115% of protein needs Add MVI and trace elements to TPN as haven't been taking PO MVI d/t NPO Potassium 10 meq iv x4 Monitor TPN labs on Mon/Thurs - BMET and Phos in AM F/U ability to advance diet back from NPO   Addysen Louth BS, PharmD, BCPS Clinical Pharmacist 12/12/2023 7:04 AM  Contact: [phone] after 3 PM  "Be curious, not judgmental..." -Debbora Dus

## 2023-12-13 LAB — COMPREHENSIVE METABOLIC PANEL
ALT: 19 U/L (ref 0–44)
AST: 20 U/L (ref 15–41)
Albumin: 2.2 g/dL — ABNORMAL LOW (ref 3.5–5.0)
Alkaline Phosphatase: 77 U/L (ref 38–126)
Anion gap: 5 (ref 5–15)
BUN: 12 mg/dL (ref 8–23)
CO2: 21 mmol/L — ABNORMAL LOW (ref 22–32)
Calcium: 8 mg/dL — ABNORMAL LOW (ref 8.9–10.3)
Chloride: 108 mmol/L (ref 98–111)
Creatinine, Ser: 0.48 mg/dL (ref 0.44–1.00)
GFR, Estimated: >60 mL/min (ref >60)
Glucose, Bld: 120 mg/dL — ABNORMAL HIGH (ref 70–99)
Potassium: 3.4 mmol/L — ABNORMAL LOW (ref 3.5–5.1)
Sodium: 134 mmol/L — ABNORMAL LOW (ref 135–145)
Total Bilirubin: 0.4 mg/dL (ref <1.2)
Total Protein: 4.9 g/dL — ABNORMAL LOW (ref 6.5–8.1)

## 2023-12-13 LAB — PHOSPHORUS: Phosphorus: 2.5 mg/dL (ref 2.5–4.6)

## 2023-12-13 LAB — CBC
HCT: 27.9 % — ABNORMAL LOW (ref 36.0–46.0)
Hemoglobin: 8.9 g/dL — ABNORMAL LOW (ref 12.0–15.0)
MCH: 30.4 pg (ref 26.0–34.0)
MCHC: 31.9 g/dL (ref 30.0–36.0)
MCV: 95.2 fL (ref 80.0–100.0)
Platelets: 286 10*3/uL (ref 150–400)
RBC: 2.93 MIL/uL — ABNORMAL LOW (ref 3.87–5.11)
RDW: 15.1 % (ref 11.5–15.5)
WBC: 8.1 10*3/uL (ref 4.0–10.5)
nRBC: 0 % (ref 0.0–0.2)

## 2023-12-13 LAB — MAGNESIUM: Magnesium: 1.8 mg/dL (ref 1.7–2.4)

## 2023-12-13 LAB — TRIGLYCERIDES: Triglycerides: 84 mg/dL (ref <150)

## 2023-12-13 MED ORDER — PIPERACILLIN-TAZOBACTAM 3.375 G IVPB
3.3750 g | Freq: Three times a day (TID) | INTRAVENOUS | Status: AC
  Administered 2023-12-13: 3.375 g via INTRAVENOUS
  Filled 2023-12-13: qty 50

## 2023-12-13 MED ORDER — POTASSIUM CHLORIDE 10 MEQ/50ML IV SOLN
10.0000 meq | INTRAVENOUS | Status: AC
  Administered 2023-12-13 (×5): 10 meq via INTRAVENOUS
  Filled 2023-12-13 (×5): qty 50

## 2023-12-13 MED ORDER — ALTEPLASE 2 MG IJ SOLR
2.0000 mg | Freq: Once | INTRAMUSCULAR | Status: AC
  Administered 2023-12-13: 2 mg
  Filled 2023-12-13: qty 2

## 2023-12-13 MED ORDER — TRACE MINERALS CU-MN-SE-ZN 300-55-60-3000 MCG/ML IV SOLN
INTRAVENOUS | Status: AC
  Filled 2023-12-13 (×2): qty 2000

## 2023-12-13 MED ORDER — FAT EMUL FISH OIL/PLANT BASED 20% (SMOFLIPID)IV EMUL
250.0000 mL | INTRAVENOUS | Status: AC
Start: 2023-12-13 — End: 2023-12-14
  Administered 2023-12-13: 250 mL via INTRAVENOUS
  Filled 2023-12-13: qty 250

## 2023-12-13 MED ORDER — ENSURE ENLIVE PO LIQD
237.0000 mL | Freq: Three times a day (TID) | ORAL | Status: DC
Start: 2023-12-13 — End: 2023-12-15
  Administered 2023-12-13: 237 mL via ORAL

## 2023-12-13 NOTE — Progress Notes (Signed)
Nutrition Follow-up  DOCUMENTATION CODES:   Severe malnutrition in context of acute illness/injury  INTERVENTION:   -Ensure Enlive po TID, each supplement provides 350 kcal and 20 grams of protein -TPN management per pharmacy -Magic cup TID with meals, each supplement provides 290 kcal and 9 grams of protein   NUTRITION DIAGNOSIS:   Severe Malnutrition (in the context of acute illness) related to  (inadequate energy intake) as evidenced by energy intake < or equal to 50% for > or equal to 5 days, moderate fat depletion, moderate muscle depletion.  Ongoing  GOAL:   Patient will meet greater than or equal to 90% of their needs  Progressing   MONITOR:   PO intake, Supplement acceptance, Labs, Weight trends  REASON FOR ASSESSMENT:   Consult Assessment of nutrition requirement/status  ASSESSMENT:   Pt with hx of HTN, HLD, GERD, and diverticulosis presented for planned surgery after a large duodenal mass was seen on imaging at admission in November and was presumed to be malignant. Underwent pancreaticoduodenectomy and placement of pancreatic duct stent.  12/3 - Op, pancreaticoduodenectomy and placement of pancreatic duct stent.  12/10 - Soft diet  12/11 CT: possible post-op seroma or hematoma vs abscess, distal colonic diverticulosis  12/12 - TPN started,  12/13 - CT aspiration and drain placement, NPO 12/15 - NG inserted for persistent emesis  12/18- venous duplex positive for VTE; started on treatment dose lovenox 12/20- NGT clamping trials 12/21- NGT d/c, advanced to clear liquids 12/22- advanced to full liquids  Reviewed I/O's: +779 ml x 24 hours and +2.7 L since 11/29/23  Drain output: 375 ml x 24 hours   Per MD notes, final pathology "adenocarcinoma duodenum, positive margin, positive LN - pT3pN2cM0 ".   Pt unavailable at time of visit. Attempted to speak with pt via call to hospital room phone, however, unable to reach.   Case discussed with RN. Pt has been  able to keep down and tolerating full liquids and thinks pt may be agreeable to supplements.   Pt remains on TPN for nutritional support (Clinimix E 8/10 1L at 42 ml/hr on Sun/Tues/Wed/Thurs/Fri/Sat, Clinimix E 8/10 2L at 83 ml/hr on Mon only, and SMOFlipid daily). Regimen provides (on average) 1257 kcals and 92 grams protein daily, meeting 90% of estimated kcal needs and 100% of estimated protein needs.   Reviewed wt hx; wt has been stable over the past week.  Medications reviewed and include lovenox, reglan, lopressor, protonix, zosyn, and diflucan.   Per chart review, possible plan for CIR vs LTACH.   Labs reviewed: Na: 134, K: 3.4 (on supplementation), CBGS: 99-115 (inpatient orders for glycemic control are none).    Diet Order:   Diet Order             Diet full liquid Room service appropriate? Yes; Fluid consistency: Thin  Diet effective now                   EDUCATION NEEDS:   Education needs have been addressed  Skin:  Skin Assessment: Skin Integrity Issues: Skin Integrity Issues:: Incisions Incisions: Abdomen  Last BM:  12/13/23 (type 7)  Height:   Ht Readings from Last 1 Encounters:  11/23/23 4\' 10"  (1.473 m)    Weight:   Wt Readings from Last 1 Encounters:  12/13/23 57.2 kg    Ideal Body Weight:  43.9 kg  BMI:  Body mass index is 26.33 kg/m.  Estimated Nutritional Needs:   Kcal:  1400-1700 kcal/d  Protein:  65-80g/d  Fluid:  1.5-1.7L/d    Levada Schilling, RD, LDN, CDCES Registered Dietitian III Certified Diabetes Care and Education Specialist If unable to reach this RD, please use "RD Inpatient" group chat on secure chat between hours of 8am-4 pm daily

## 2023-12-13 NOTE — Progress Notes (Signed)
Occupational Therapy Treatment Patient Details Name: Christine Morse MRN: [id number] DOB: 06-Jan-1936 Today's Date: 12/13/2023   History of present illness Patient is an 87 y/o female admitted 11/23/23 with diagnosis of duodenal adenoma and underwent laparoscopic Whipple procedure. post-op subcapsular fluid collection around the anterior aspect of the left liver lobe. Drain placement in IR 12/03/23. NGT inserted 12/15, dislodged then replaced.  Positive for R subclavian and axillary DVT on 12/07/23. 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   OT comments  Patient received in supine and states she is feeling better but continues to feel weak. Patient requiring min assist and increased time to get to EOB. Patient requiring min assist to stand from EOB to RW and to ambulate to bathroom for self care tasks. Patient unable to stand at sink for grooming due to complaints of fatigue and completed session in recliner. Patient will benefit from continued inpatient follow up therapy, <3 hours/day for continued OT treatment to increase independence and safety with bathing, dressing, and toilet transfers.       If plan is discharge home, recommend the following:  A lot of help with bathing/dressing/bathroom;A little help with walking and/or transfers;Assist for transportation;Assistance with cooking/housework;Help with stairs or ramp for entrance   Equipment Recommendations  BSC/3in1    Recommendations for Other Services      Precautions / Restrictions Precautions Precautions: Fall Precaution Comments: x2 blake drains, x1 JP drain Restrictions Weight Bearing Restrictions Per Provider Order: No       Mobility Bed Mobility Overal bed mobility: Needs Assistance Bed Mobility: Rolling, Sidelying to Sit Rolling: Contact guard assist Sidelying to sit: Min assist       General bed mobility comments: assistance due to lines    Transfers Overall transfer level:  Needs assistance Equipment used: Rolling walker (2 wheels) Transfers: Sit to/from Stand, Bed to chair/wheelchair/BSC Sit to Stand: Min assist     Step pivot transfers: Min assist     General transfer comment: transfers to [facility] in bathroom and recliner with cues for hand placment     Balance Overall balance assessment: Needs assistance Sitting-balance support: Feet supported Sitting balance-[name]: Fair Sitting balance - Comments: on EOB   Standing balance support: Bilateral upper extremity supported, Single extremity supported Standing balance-[name]: Poor                             ADL either performed or assessed with clinical judgement   ADL Overall ADL's : Needs assistance/impaired     Grooming: Wash/dry hands;Wash/dry face;Oral care;Brushing hair;Set up;Sitting Grooming Details (indicate cue type and reason): at sink; declined standing due to fatigue Upper Body Bathing: Minimal assistance;Sitting   Lower Body Bathing: Moderate assistance;Sit to/from stand           Toilet Transfer: Minimal assistance;Rolling walker (2 wheels)                  Extremity/Trunk Assessment              Vision       Perception     Praxis      Cognition Arousal: Alert Behavior During Therapy: Flat affect Overall Cognitive Status: Impaired/Different from baseline Area of Impairment: Memory, Safety/judgement, Problem solving                     Memory: Decreased short-term memory   Safety/Judgement: Decreased awareness of deficits  Problem Solving: Slow processing General Comments: slow to intiate but improved during session        Exercises      Shoulder Instructions       General Comments      Pertinent Vitals/ Pain       Pain Assessment Pain Assessment: Faces Faces Pain Scale: Hurts a little bit Pain Location: abdomen Pain Descriptors / Indicators: Discomfort, Grimacing, Operative site guarding Pain  Intervention(s): Monitored during session  Home Living                                          Prior Functioning/Environment              Frequency  Min 1X/week        Progress Toward Goals  OT Goals(current goals can now be found in the care plan section)  Progress towards OT goals: Progressing toward goals  Acute Rehab OT Goals Patient Stated Goal: get better OT Goal Formulation: With patient Time For Goal Achievement: 12/10/23 Potential to Achieve Goals: Good ADL Goals Pt Will Perform Grooming: with supervision;standing Pt Will Perform Upper Body Bathing: with set-up;sitting Pt Will Perform Lower Body Bathing: sit to/from stand;with contact guard assist Pt Will Perform Lower Body Dressing: with contact guard assist;sit to/from stand Pt Will Transfer to Toilet: with supervision;ambulating;regular height toilet  Plan      Co-evaluation                 AM-PAC OT "6 Clicks" Daily Activity     Outcome Measure   Help from another person eating meals?: A Little Help from another person taking care of personal grooming?: A Little Help from another person toileting, which includes using toliet, bedpan, or urinal?: A Lot Help from another person bathing (including washing, rinsing, drying)?: A Lot Help from another person to put on and taking off regular upper body clothing?: A Little Help from another person to put on and taking off regular lower body clothing?: A Lot 6 Click Score: 15    End of Session Equipment Utilized During Treatment: Rolling walker (2 wheels)  OT Visit Diagnosis: Unsteadiness on feet (R26.81);Muscle weakness (generalized) (M62.81)   Activity Tolerance Patient tolerated treatment well   Patient Left in chair;with call bell/phone within reach;with chair alarm set   Nurse Communication Mobility status        Time: 8119-1478 OT Time Calculation (min): 25 min  Charges: OT General Charges $OT Visit: 1 Visit OT  Treatments $Self Care/Home Management : 23-37 mins  Alfonse Flavors, OTA Acute Rehabilitation Services  Office [phone]   Dewain Penning 12/13/2023, 1:40 PM

## 2023-12-13 NOTE — Progress Notes (Signed)
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 post-op subcapsular fluid collection around the anterior aspect of the left liver lobe. Drain placement in IR 12/03/23. NGT inserted 12/15, dislodged then replaced.  Positive for R subclavian and axillary DVT on 12/07/23. 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    PT Comments  Patient progressing with mobility able to walk further distance in hallway though endorses extreme fatigue after.  She continues to need cues throughout for safety, posture and environmental awareness.  Discussed trying to advance diet, though still with discomfort even to take sips of clears.  RN in the room and aware.  PT will continue to follow.  Recommend inpatient rehab (<3 hours/day) prior to d/c home.     If plan is discharge home, recommend the following: A little help with walking and/or transfers;Assistance with cooking/housework;Assist for transportation;Help with stairs or ramp for entrance;A little help with bathing/dressing/bathroom   Can travel by private vehicle     Yes  Equipment Recommendations  Rolling walker (2 wheels);BSC/3in1    Recommendations for Other Services       Precautions / Restrictions Precautions Precautions: Fall Precaution Comments: x2 blake drains, x1 JP drain Restrictions Weight Bearing Restrictions Per Provider Order: No     Mobility  Bed Mobility Overal bed mobility: Needs Assistance Bed Mobility: Rolling, Sidelying to Sit Rolling: Contact guard assist, Used rails Sidelying to sit: Contact guard assist   Sit to supine: Min assist   General bed mobility comments: assist for lifting trunk    Transfers Overall transfer level: Needs assistance Equipment used:  Rolling walker (2 wheels) Transfers: Sit to/from Stand Sit to Stand: Min assist           General transfer comment: up to stand with A for balance and to initiate anterior weight shift    Ambulation/Gait Ambulation/Gait assistance: Contact guard assist Gait Distance (Feet): 150 Feet Assistive device: Rolling walker (2 wheels) Gait Pattern/deviations: Step-through pattern, Decreased stride length, Trunk flexed, Wide base of support       General Gait Details: multlimodal cues for posture, forward gaze and assist for walker proximity.   Stairs             Wheelchair Mobility     Tilt Bed    Modified Rankin (Stroke Patients Only)       Balance Overall balance assessment: Needs assistance Sitting-balance support: Feet supported Sitting balance-[name]: Good     Standing balance support: Bilateral upper extremity supported Standing balance-[name]: Poor Standing balance comment: UE support for balance                            Cognition Arousal: Alert Behavior During Therapy: Flat affect Overall Cognitive Status: Impaired/Different from baseline Area of Impairment: Memory, Safety/judgement, Problem solving                     Memory: Decreased short-term memory   Safety/Judgement: Decreased awareness of deficits   Problem Solving: Slow processing General Comments: needing encouragement to participate, help to find her room end of sessiom, and no re        Exercises      General Comments General comments (skin integrity, edema, etc.): 3 drains pinned to  gown; VSS ambulating on RA; BP 174/89 prior to ambulation      Pertinent Vitals/Pain Pain Assessment Pain Assessment: Faces Pain Score: 2  Breathing: normal Negative Vocalization: occasional moan/groan, low speech, negative/disapproving quality Facial Expression: smiling or inexpressive Body Language: relaxed Consolability: distracted or reassured by  voice/touch PAINAD Score: 2 Pain Location: abdomen Pain Descriptors / Indicators: Discomfort, Grimacing, Operative site guarding Pain Intervention(s): Monitored during session, Limited activity within patient's tolerance, Repositioned    Home Living                          Prior Function            PT Goals (current goals can now be found in the care plan section) Progress towards PT goals: Progressing toward goals    Frequency           PT Plan      Co-evaluation              AM-PAC PT "6 Clicks" Mobility   Outcome Measure  Help needed turning from your back to your side while in a flat bed without using bedrails?: A Little Help needed moving from lying on your back to sitting on the side of a flat bed without using bedrails?: A Little Help needed moving to and from a bed to a chair (including a wheelchair)?: A Little Help needed standing up from a chair using your arms (e.g., wheelchair or bedside chair)?: A Little Help needed to walk in hospital room?: A Little Help needed climbing 3-5 steps with a railing? : Total 6 Click Score: 16    End of Session   Activity Tolerance: Patient tolerated treatment well Patient left: in chair;with chair alarm set;with call bell/phone within reach   PT Visit Diagnosis: Other abnormalities of gait and mobility (R26.89);Difficulty in walking, not elsewhere classified (R26.2);Pain Pain - part of body:  (abdomen)     Time: 4098-1191 PT Time Calculation (min) (ACUTE ONLY): 20 min  Charges:    $Gait Training: 8-22 mins PT General Charges $$ ACUTE PT VISIT: 1 Visit                     Sheran Lawless, PT Acute Rehabilitation Services Office:[phone] 12/13/2023    [name] 12/13/2023, 4:25 PM

## 2023-12-13 NOTE — Care Management Important Message (Signed)
Important Message  Patient Details  Name: Christine Morse MRN: [id number] Date of Birth: 21-Sep-1936   Important Message Given:  Yes - Medicare IM     Sherilyn Banker 12/13/2023, 3:11 PM

## 2023-12-13 NOTE — Progress Notes (Signed)
PHARMACY - TOTAL PARENTERAL NUTRITION CONSULT NOTE  Indication:  delayed gastric emptying  Patient Measurements: Height: 4\' 10"  (147.3 cm) Weight: 57.2 kg (126 lb) IBW/kg (Calculated) : 40.9 TPN AdjBW (KG): 44.5 Body mass index is 26.33 kg/m. 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  Patient was started on a CLD on 12/6-12/9 and then advanced to a soft diet since 12/10.  Patient has had minimal PO intake since being post-op and continues to experience nausea. Delayed gastric emptying likely related to possible pancreatic leak.  Pharmacy consulted to dose TPN.  Glucose / Insulin: A1c 3.7 (11/15/23) - CBGs < 180. SSI D/C'ed 12/08/23. Electrolytes: Na 134, K 3.4 s/p 4 runs, CO2 21, Phos 2.5, Mg 1.8 Renal: SCr < 1, BUN WNL Hepatic: LFTs / tbili WNL, albumin 2.4 Intake / Output; MIVF: UOP not charted (2 unmeasured occurences), NG removed, drain 375 mL, LBM 12/22 (s/p Reglan x3 doses 12/11-12/12, restarted 12/15) GI Imaging: 12/07 Ab XR: unremarkable bowel gas pattern. 12/11 CT: possible post-op seroma or hematoma vs abscess, distal colonic diverticulosis 12/15 Ab XR: expected post-op changes, NGT in correct place GI Surgeries / Procedures:  12/3 diagnostic laparoscopy, Whipple procedure, pancreatic duct stent 12/13 subhepatic fluid collection aspiration and drainage  Central access: PICC placed 12/01/23 TPN start date: 12/02/23  Nutritional Goals: Clinimix E 8/10 1L at 42 ml/hr on Sun/Tues/Wed/Thurs/Fri/Sat Clinimix E 8/10 2L at 83 ml/hr on Mon SMOF lipids 250 ml daily  Provides 100% AA goal and ~95% of kCal goal  Electrolytes in Clinimix E 1L bag: Na , K , Mag , Ca 4.27mEq, Phos , Ac , Cl  Electrolytes in Clnimix E 8/10 2L bag: Na 70 mEq, K 60 mEq, Mag 10 mEq, Ca 9 mEq, Phos 30 mmol, Acetate 166 , Cl 152  mEq   RD Estimated Needs Total Energy Estimated Needs: 1400-1700 kcal/d Total Protein Estimated Needs: 65-80g/d Total Fluid Estimated Needs: 1.5-1.7L/d  Current Nutrition:  TPN FLD   Plan:  Clinimix E 8/10 1L at 42 ml/hr on Sun/Tues/Wed/Thurs/Fri/Sat Clinimix E 8/10 2L at 83 ml/hr on Mon only SMOFlipid daily  Provides a weekly average of 1257 kCal and 92g AA per day, meeting 90% of minimal kCal and 115% of protein needs Add MVI and trace elements to TPN - consider change to PO  Potassium 10 mEq IV x5 Monitor TPN labs on Mon/Thurs  F/U ability to advance diet   Thank you for involving pharmacy in this patient's care.  Loura Back, PharmD, BCPS Clinical Pharmacist Clinical phone for 12/13/2023 is [phone] 12/13/2023 7:22 AM

## 2023-12-13 NOTE — Progress Notes (Signed)
20 Days Post-Op   Subjective/Chief Complaint: Advanced to full liquids and ate some yesterday and this morning for breakfast. Having nausea requiring zofran yesterday afternoon and asking for antiemetic now. No vomiting. No pain. Ambulating. Having bowel movements  Objective: Vital signs in last 24 hours: Temp:  [97.8 F (36.6 C)-99.1 F (37.3 C)] 98 F (36.7 C) (12/23 1119) Pulse Rate:  [67-80] 76 (12/23 1119) Resp:  [20-24] 20 (12/23 1119) BP: (136-163)/(58-84) 161/59 (12/23 1119) SpO2:  [94 %-98 %] 95 % (12/23 1119) Weight:  [57.2 kg] 57.2 kg (12/23 0253) Last BM Date : 12/12/23  Intake/Output from previous day: 12/22 0701 - 12/23 0700 In: 1154 [P.O.:120; I.V.:555.1; IV Piggyback:473.9] Out: 375 [Drains:375] Intake/Output this shift: No intake/output data recorded.  General appearance: sitting up in chair. alert, cooperative. Resp: breathing comfortably GI: soft, non distended. LLQ drain clear serous. R abd IR perc drain scant clear serous. RLQ drain cloudy yellow. Incision without erythema or drainage. Mild TTP around incision Extremities: extremities normal, atraumatic, no cyanosis or edema  Lab Results:  Recent Labs    12/12/23 0455 12/13/23 0551  WBC 9.9 8.1  HGB 8.9* 8.9*  HCT 28.2* 27.9*  PLT 311 286    BMET Recent Labs    12/12/23 0455 12/13/23 0551  NA 137 134*  K 3.3* 3.4*  CL 109 108  CO2 21* 21*  GLUCOSE 126* 120*  BUN 12 12  CREATININE 0.55 0.48  CALCIUM 8.3* 8.0*   PT/INR No results for input(s): "LABPROT", "INR" in the last 72 hours.   ABG No results for input(s): "PHART", "HCO3" in the last 72 hours.  Invalid input(s): "PCO2", "PO2"   Studies/Results: No results found.    Anti-infectives: Anti-infectives (From admission, onward)    Start     Dose/Rate Route Frequency Ordered Stop   11/30/23 1230  fluconazole (DIFLUCAN) IVPB 400 mg        400 mg 100 mL/hr over 120 Minutes Intravenous Every 24 hours 11/30/23 1135      11/26/23 0945  piperacillin-tazobactam (ZOSYN) IVPB 3.375 g        3.375 g 12.5 mL/hr over 240 Minutes Intravenous Every 8 hours 11/26/23 0853     11/23/23 2100  ceFAZolin (ANCEF) IVPB 2g/100 mL premix        2 g 200 mL/hr over 30 Minutes Intravenous Every 8 hours 11/23/23 1654 11/23/23 2214   11/23/23 0700  ceFAZolin (ANCEF) IVPB 2g/100 mL premix        2 g 200 mL/hr over 30 Minutes Intravenous On call to O.R. 11/23/23 0654 11/23/23 1249       Assessment/Plan: Christine Morse is an 87 year old female status post Whipple Procedure on 11/23/2023, (POD 20).  Final pathology - adenocarcinoma duodenum, positive margin, positive LN - pT3pN2cM0,   HOCM - continue beta blockade and treat hypertension.   IV metoprolol to q4h instead of q6 per Cardiology.   Anemia of chronic disease, chronic blood loss anemia, and acute blood loss anemia - stable with hgb 8.9. Monitor, recheck tomorrow.  Hyperglycemia - improved max 120s last 24 hours.  Hypophosphatemia - resolved on last check, getting checked M/Th for TPN labs.   Hypokalemia - replace today 3.3, IV replacement ordered Severe protein calorie malnutrition - TNA. Continue diet and monitor intake and tolerance  Pancreatic leak - Continue Zosyn/Fluconazole. Leukocytosis resolved.  Drain output total 375 mL last 24 hours. 330 ml coming from RLQ drain which is still slightly murky. Only 15 ml from IR  perc drain - will discuss with MD timing of drain study for removal.   Delayed gastric emptying. Qtc ok.  Continue reglan. Nausea with fulls. Will go back to clears but can continue ensure  Abdominal fluid collection - CT aspiration and drain placement 12/13 (anterior hepatic fluid collection) - serosang. Rare candida seen on final culture. On fluconazole. WBC normal today. Will discuss with MD duration of zosyn   RUE DVT - treatment dose lovenox, reviewed with vascular and will continue PICC for now  Pain control with prn tylenol, prn robaxin, prn fentanyl.  She has not need pain meds for a few days OOB, IS, PT consult.  VTE - lovenox treatment dose. Keep picc.    SW consult for placement.   LOS: 20 days   Christine Morse, [facility] Surgery 12/13/2023, 12:04 PM Please see Amion for pager number during day hours 7:00am-4:30pm

## 2023-12-13 NOTE — Progress Notes (Signed)
Inpatient Rehab Admissions Coordinator:   CIR continues to follow from a distance.  Pt remains on TPN at this time.    Estill Dooms, PT, DPT Admissions Coordinator [phone] 12/13/23  12:36 PM

## 2023-12-14 ENCOUNTER — Inpatient Hospital Stay (HOSPITAL_COMMUNITY): Payer: Medicare Other

## 2023-12-14 LAB — CBC
HCT: 30.1 % — ABNORMAL LOW (ref 36.0–46.0)
Hemoglobin: 9.6 g/dL — ABNORMAL LOW (ref 12.0–15.0)
MCH: 30.2 pg (ref 26.0–34.0)
MCHC: 31.9 g/dL (ref 30.0–36.0)
MCV: 94.7 fL (ref 80.0–100.0)
Platelets: 291 10*3/uL (ref 150–400)
RBC: 3.18 MIL/uL — ABNORMAL LOW (ref 3.87–5.11)
RDW: 15.3 % (ref 11.5–15.5)
WBC: 9.7 10*3/uL (ref 4.0–10.5)
nRBC: 0 % (ref 0.0–0.2)

## 2023-12-14 MED ORDER — TRACE MINERALS CU-MN-SE-ZN 300-55-60-3000 MCG/ML IV SOLN
INTRAVENOUS | Status: AC
  Filled 2023-12-14 (×2): qty 1000

## 2023-12-14 MED ORDER — FAT EMUL FISH OIL/PLANT BASED 20% (SMOFLIPID)IV EMUL
250.0000 mL | INTRAVENOUS | Status: AC
  Administered 2023-12-14: 250 mL via INTRAVENOUS
  Filled 2023-12-14: qty 250

## 2023-12-14 MED ORDER — POTASSIUM CHLORIDE 10 MEQ/100ML IV SOLN
10.0000 meq | INTRAVENOUS | Status: AC
  Administered 2023-12-14 (×4): 10 meq via INTRAVENOUS
  Filled 2023-12-14 (×4): qty 100

## 2023-12-14 NOTE — Progress Notes (Signed)
21 Days Post-Op   Subjective/Chief Complaint: Vomited overnight and again this AM. NGT placed with bilious fluid. Still having some bowel function. Denies worsening abdominal pain.   Objective: Vital signs in last 24 hours: Temp:  [97.3 F (36.3 C)-99.2 F (37.3 C)] 97.6 F (36.4 C) (12/24 1050) Pulse Rate:  [72-79] 72 (12/24 1050) Resp:  [18-26] 18 (12/24 1050) BP: (134-177)/(57-74) 175/69 (12/24 1050) SpO2:  [93 %-99 %] 99 % (12/24 1050) Last BM Date : 12/13/23  Intake/Output from previous day: 12/23 0701 - 12/24 0700 In: 1588.2 [I.V.:1009.5; IV Piggyback:578.7] Out: 265 [Drains:265] Intake/Output this shift: No intake/output data recorded.  General appearance: sitting up in chair. alert, cooperative. Resp: breathing comfortably GI: soft, non distended. LLQ drain clear serous. R abd IR perc drain scant clear serous. RLQ drain cloudy yellow. Incision without erythema or drainage. Mild TTP around incision. NGT with bilious fluid  Extremities: extremities normal, atraumatic, no cyanosis or edema  Lab Results:  Recent Labs    12/13/23 0551 12/14/23 0502  WBC 8.1 9.7  HGB 8.9* 9.6*  HCT 27.9* 30.1*  PLT 286 291    BMET Recent Labs    12/12/23 0455 12/13/23 0551  NA 137 134*  K 3.3* 3.4*  CL 109 108  CO2 21* 21*  GLUCOSE 126* 120*  BUN 12 12  CREATININE 0.55 0.48  CALCIUM 8.3* 8.0*   PT/INR No results for input(s): "LABPROT", "INR" in the last 72 hours.   ABG No results for input(s): "PHART", "HCO3" in the last 72 hours.  Invalid input(s): "PCO2", "PO2"   Studies/Results: DG Abd 1 View Result Date: 12/14/2023 CLINICAL DATA:  NGT placement EXAM: ABDOMEN - 1 VIEW COMPARISON:  12/05/2023. FINDINGS: Limited image including only the upper abdomen demonstrates an NG tube tip superimposed with stomach below the diaphragm. IMPRESSION: NG tube in place. Electronically Signed   By: Layla Maw M.D.   On: 12/14/2023 09:44       Anti-infectives: Anti-infectives (From admission, onward)    Start     Dose/Rate Route Frequency Ordered Stop   12/13/23 1700  piperacillin-tazobactam (ZOSYN) IVPB 3.375 g        3.375 g 12.5 mL/hr over 240 Minutes Intravenous Every 8 hours 12/13/23 1412 12/13/23 2139   11/30/23 1230  fluconazole (DIFLUCAN) IVPB 400 mg        400 mg 100 mL/hr over 120 Minutes Intravenous Every 24 hours 11/30/23 1135     11/26/23 0945  piperacillin-tazobactam (ZOSYN) IVPB 3.375 g  Status:  Discontinued        3.375 g 12.5 mL/hr over 240 Minutes Intravenous Every 8 hours 11/26/23 0853 12/13/23 1412   11/23/23 2100  ceFAZolin (ANCEF) IVPB 2g/100 mL premix        2 g 200 mL/hr over 30 Minutes Intravenous Every 8 hours 11/23/23 1654 11/23/23 2214   11/23/23 0700  ceFAZolin (ANCEF) IVPB 2g/100 mL premix        2 g 200 mL/hr over 30 Minutes Intravenous On call to O.R. 11/23/23 0654 11/23/23 1249       Assessment/Plan: [name] is an 87 year old female status post Whipple Procedure on 11/23/2023, (POD 21).  Final pathology - adenocarcinoma duodenum, positive margin, positive LN - pT3pN2cM0  HOCM - continue beta blockade and treat hypertension. IV metoprolol to q4h instead of q6 per Cardiology.   Anemia of chronic disease, chronic blood loss anemia, and acute blood loss anemia - stable with hgb 9.6 Hyperglycemia - improving, monitor  Hypophosphatemia - resolved on last check, getting checked M/Th for TPN labs.   Hypokalemia - replace today 3.4, IV replacement ordered Severe protein calorie malnutrition - TNA. NGT and NPO  Pancreatic leak - Continue Zosyn/Fluconazole. Leukocytosis resolved.  Drain output total 265 mL last 24 hours. 255 ml coming from RLQ drain which is still slightly murky. Only 10 ml from IR perc drain   Delayed gastric emptying. Qtc ok.  Continue reglan. NGT and NPO. Ok to have sips of clears for comfort  Abdominal fluid collection - CT aspiration and drain placement 12/13  (anterior hepatic fluid collection) - serosang. Rare candida seen on final culture. On fluconazole. WBC normal today. Zosyn completed, diflucan. Monitor   RUE DVT - treatment dose lovenox, reviewed with vascular and will continue PICC for now  Pain control with prn tylenol, prn robaxin, prn fentanyl. She has not need pain meds for a few days OOB, IS, PT consult.  VTE - lovenox treatment dose. Keep picc.    SW consult for placement.   LOS: 21 days   Juliet Rude, [facility] 12/14/2023, 11:19 AM Please see Amion for pager number during day hours 7:00am-4:30pm

## 2023-12-14 NOTE — Progress Notes (Addendum)
Inpatient Rehabilitation Admissions Coordinator   We continue to follow at a distance as she remains on TNA and NGT. Recommend goals of care discussions with patient and family.  Ottie Glazier, RN, MSN Rehab Admissions Coordinator [phone] 12/14/2023 12:52 PM  We will sign off at this time. Please re consult when appropriate.We would not admit with ongoing TNA.  Ottie Glazier, RN, MSN Rehab Admissions Coordinator [phone] 12/14/2023 3:11 PM

## 2023-12-14 NOTE — Progress Notes (Signed)
PHARMACY - TOTAL PARENTERAL NUTRITION CONSULT NOTE  Indication:  delayed gastric emptying  Patient Measurements: Height: 4\' 10"  (147.3 cm) Weight: 57.2 kg (126 lb) IBW/kg (Calculated) : 40.9 TPN AdjBW (KG): 44.5 Body mass index is 26.33 kg/m. 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  Patient was started on a CLD on 12/6-12/9 and then advanced to a soft diet since 12/10.  Patient has had minimal PO intake since being post-op and continues to experience nausea. Delayed gastric emptying likely related to possible pancreatic leak.  Pharmacy consulted to dose TPN.  Glucose / Insulin: A1c 3.7 (11/15/23) - CBGs < 180. SSI D/C'ed 12/08/23. Electrolytes: (12/23) Na 134, K 3.4 s/p 4 runs, CO2 21, Phos 2.5, Mg 1.8 Renal:  (12/23) SCr < 1, BUN WNL Hepatic: LFTs / tbili WNL, albumin 2.4 Intake / Output; MIVF: UOP not charted (4 unmeasured occurences), NG removed, drain 265 mL, 1 emesis, LBM 12/23 (s/p Reglan x3 doses 12/11-12/12, restarted 12/15) GI Imaging: 12/07 Ab XR: unremarkable bowel gas pattern. 12/11 CT: possible post-op seroma or hematoma vs abscess, distal colonic diverticulosis 12/15 Ab XR: expected post-op changes, NGT in correct place GI Surgeries / Procedures:  12/3 diagnostic laparoscopy, Whipple procedure, pancreatic duct stent 12/13 subhepatic fluid collection aspiration and drainage  Central access: PICC placed 12/01/23 TPN start date: 12/02/23  Nutritional Goals: Clinimix E 8/10 1L at 42 ml/hr on Sun/Tues/Wed/Thurs/Fri/Sat Clinimix E 8/10 2L at 83 ml/hr on Mon SMOF lipids 250 ml daily  Provides 100% AA goal and ~95% of kCal goal  Electrolytes in Clinimix E 1L bag: Na , K , Mag , Ca 4.78mEq, Phos , Ac , Cl  Electrolytes in Clnimix E 8/10 2L bag: Na 70 mEq, K 60 mEq, Mag 10 mEq, Ca 9 mEq, Phos 30  mmol, Acetate 166 , Cl 152 mEq   RD Estimated Needs Total Energy Estimated Needs: 1400-1700 kcal/d Total Protein Estimated Needs: 65-80g/d Total Fluid Estimated Needs: 1.5-1.7L/d  Current Nutrition:  TPN FLD   Plan:  Clinimix E 8/10 1L at 42 ml/hr on Sun/Tues/Wed/Thurs/Fri/Sat Clinimix E 8/10 2L at 83 ml/hr on Mon only SMOFlipid daily  Provides a weekly average of 1257 kCal and 92g AA per day, meeting 90% of minimal kCal and 115% of protein needs Add MVI and trace elements to TPN - consider change to PO  Monitor TPN labs on Mon/Thurs  F/U ability to advance diet   Thank you for involving pharmacy in this patient's care.  Greta Doom BS, PharmD, BCPS Clinical Pharmacist 12/14/2023 7:10 AM  Contact: [phone] after 3 PM  "Be curious, not judgmental..." -Debbora Dus

## 2023-12-15 ENCOUNTER — Inpatient Hospital Stay (HOSPITAL_COMMUNITY): Payer: Medicare Other

## 2023-12-15 LAB — CBC
HCT: 31.3 % — ABNORMAL LOW (ref 36.0–46.0)
Hemoglobin: 10.3 g/dL — ABNORMAL LOW (ref 12.0–15.0)
MCH: 30.5 pg (ref 26.0–34.0)
MCHC: 32.9 g/dL (ref 30.0–36.0)
MCV: 92.6 fL (ref 80.0–100.0)
Platelets: 350 10*3/uL (ref 150–400)
RBC: 3.38 MIL/uL — ABNORMAL LOW (ref 3.87–5.11)
RDW: 15.4 % (ref 11.5–15.5)
WBC: 7.8 10*3/uL (ref 4.0–10.5)
nRBC: 0 % (ref 0.0–0.2)

## 2023-12-15 LAB — BASIC METABOLIC PANEL
Anion gap: 6 (ref 5–15)
BUN: 12 mg/dL (ref 8–23)
CO2: 22 mmol/L (ref 22–32)
Calcium: 8.5 mg/dL — ABNORMAL LOW (ref 8.9–10.3)
Chloride: 105 mmol/L (ref 98–111)
Creatinine, Ser: 0.41 mg/dL — ABNORMAL LOW (ref 0.44–1.00)
GFR, Estimated: >60 mL/min (ref >60)
Glucose, Bld: 113 mg/dL — ABNORMAL HIGH (ref 70–99)
Potassium: 3.5 mmol/L (ref 3.5–5.1)
Sodium: 133 mmol/L — ABNORMAL LOW (ref 135–145)

## 2023-12-15 LAB — MAGNESIUM: Magnesium: 1.9 mg/dL (ref 1.7–2.4)

## 2023-12-15 MED ORDER — TRACE MINERALS CU-MN-SE-ZN 300-55-60-3000 MCG/ML IV SOLN
INTRAVENOUS | Status: AC
  Filled 2023-12-15 (×2): qty 1000

## 2023-12-15 MED ORDER — METOPROLOL TARTRATE 5 MG/5ML IV SOLN
7.5000 mg | Freq: Every day | INTRAVENOUS | Status: DC
Start: 2023-12-15 — End: 2024-01-14
  Administered 2023-12-15 – 2024-01-14 (×173): 7.5 mg via INTRAVENOUS
  Filled 2023-12-15 (×181): qty 10

## 2023-12-15 MED ORDER — FAT EMUL FISH OIL/PLANT BASED 20% (SMOFLIPID)IV EMUL
250.0000 mL | INTRAVENOUS | Status: AC
  Administered 2023-12-15: 250 mL via INTRAVENOUS
  Filled 2023-12-15: qty 250

## 2023-12-15 MED ORDER — MAGNESIUM SULFATE 4 GM/100ML IV SOLN
4.0000 g | Freq: Once | INTRAVENOUS | Status: AC
  Administered 2023-12-15: 4 g via INTRAVENOUS
  Filled 2023-12-15: qty 100

## 2023-12-15 MED ORDER — POTASSIUM CHLORIDE 10 MEQ/100ML IV SOLN
10.0000 meq | INTRAVENOUS | Status: AC
  Administered 2023-12-15 (×5): 10 meq via INTRAVENOUS
  Filled 2023-12-15: qty 100

## 2023-12-15 NOTE — Progress Notes (Incomplete)
Pt's NGT was out upon receiving report at shift change. Per day shift RN, MD was notified that NGT was pulled out. MD made rounds ,and verbally instructed this writer that the NG Tube should be left out, but should pt starts vomiting, NGT should be inserted back. Pt

## 2023-12-15 NOTE — Progress Notes (Signed)
Came into pt. Room to give evening medication,noticed NG was out. Messaged Dr. Hillery Hunter to see if he wanted it back in. Still waiting to hear back. Reva Bores 12/15/23 6:47 PM

## 2023-12-15 NOTE — Progress Notes (Signed)
22 Days Post-Op   Subjective/Chief Complaint: NGT replaced for emesis.  out since yesterday afternoon.  Reports relief from nausea and denies new complaints.   Objective: Vital signs in last 24 hours: Temp:  [97.6 F (36.4 C)-97.9 F (36.6 C)] 97.9 F (36.6 C) (12/24 2317) Pulse Rate:  [72-74] 72 (12/24 2317) Resp:  [18-23] 21 (12/24 2317) BP: (165-177)/(65-86) 165/69 (12/24 2317) SpO2:  [96 %-99 %] 96 % (12/24 2317) Weight:  [57.2 kg] 57.2 kg (12/25 0500) Last BM Date : 12/14/23  Intake/Output from previous day: 12/24 0701 - 12/25 0700 In: 3381.2 [I.V.:2576.2; IV Piggyback:800] Out: 985 [Emesis/NG output:750; Drains:235] Intake/Output this shift: No intake/output data recorded.  General appearance: sitting up in chair. alert, cooperative. Resp: breathing comfortably GI: soft, non distended. LLQ drain clear serous. R abd IR perc drain scant clear serous. RLQ drain cloudy yellow. Incision without erythema or drainage. Mild TTP around incision. NGT with bilious fluid  Extremities: extremities normal, atraumatic, no cyanosis or edema  Lab Results:  Recent Labs    12/14/23 0502 12/15/23 0505  WBC 9.7 7.8  HGB 9.6* 10.3*  HCT 30.1* 31.3*  PLT 291 350    BMET Recent Labs    12/13/23 0551  NA 134*  K 3.4*  CL 108  CO2 21*  GLUCOSE 120*  BUN 12  CREATININE 0.48  CALCIUM 8.0*   PT/INR No results for input(s): "LABPROT", "INR" in the last 72 hours.   ABG No results for input(s): "PHART", "HCO3" in the last 72 hours.  Invalid input(s): "PCO2", "PO2"   Studies/Results: DG Abd 1 View Result Date: 12/14/2023 CLINICAL DATA:  NGT placement EXAM: ABDOMEN - 1 VIEW COMPARISON:  12/05/2023. FINDINGS: Limited image including only the upper abdomen demonstrates an NG tube tip superimposed with stomach below the diaphragm. IMPRESSION: NG tube in place. Electronically Signed   By: Layla Maw M.D.   On: 12/14/2023 09:44      Anti-infectives: Anti-infectives  (From admission, onward)    Start     Dose/Rate Route Frequency Ordered Stop   12/13/23 1700  piperacillin-tazobactam (ZOSYN) IVPB 3.375 g        3.375 g 12.5 mL/hr over 240 Minutes Intravenous Every 8 hours 12/13/23 1412 12/13/23 2139   11/30/23 1230  fluconazole (DIFLUCAN) IVPB 400 mg        400 mg 100 mL/hr over 120 Minutes Intravenous Every 24 hours 11/30/23 1135     11/26/23 0945  piperacillin-tazobactam (ZOSYN) IVPB 3.375 g  Status:  Discontinued        3.375 g 12.5 mL/hr over 240 Minutes Intravenous Every 8 hours 11/26/23 0853 12/13/23 1412   11/23/23 2100  ceFAZolin (ANCEF) IVPB 2g/100 mL premix        2 g 200 mL/hr over 30 Minutes Intravenous Every 8 hours 11/23/23 1654 11/23/23 2214   11/23/23 0700  ceFAZolin (ANCEF) IVPB 2g/100 mL premix        2 g 200 mL/hr over 30 Minutes Intravenous On call to O.R. 11/23/23 0654 11/23/23 1249       Assessment/Plan: Christine Morse is an 87 year old female status post Whipple Procedure on 11/23/2023, (POD 22).  Final pathology - adenocarcinoma duodenum, positive margin, positive LN - pT3pN2cM0  HOCM - continue beta blockade and treat hypertension. IV metoprolol to q4h instead of q6 per Cardiology.   Anemia of chronic disease, chronic blood loss anemia, and acute blood loss anemia - stable with hgb 10.3 Hyperglycemia - improving, monitor   Hypophosphatemia -  resolved on last check, getting checked M/Th for TPN labs.   Hypokalemia - replace today 3.5, IV replacement ordered Severe protein calorie malnutrition - TNA. NGT and NPO  Pancreatic leak - Continue Zosyn/Fluconazole. Leukocytosis resolved.  Drain output total 235 mL last 24 hours. Majority coming from RLQ drain which is still slightly murky. Only 10 ml from IR perc drain   Delayed gastric emptying. Qtc ok.  Continue reglan. NGT and NPO. Ok to have sips of clears for comfort  Abdominal fluid collection - CT aspiration and drain placement 12/13 (anterior hepatic fluid collection) -  serosang. Rare candida seen on final culture. On fluconazole. WBC normal today. Zosyn completed, continue diflucan. Monitor   RUE DVT - treatment dose lovenox, reviewed with vascular and will continue PICC for now  Pain control with prn tylenol, prn robaxin, prn fentanyl. She has not need pain meds for a few days OOB, IS, PT consult.  VTE - lovenox treatment dose. Keep picc.    SW consult for placement.   LOS: 22 days   Christine Boring, MD [facility] 12/15/2023, 8:04 AM Please see Amion for pager number during day hours 7:00am-4:30pm

## 2023-12-15 NOTE — Plan of Care (Signed)
  Problem: Education: Goal: Knowledge of General Education information will improve Description: Including pain rating scale, medication(s)/side effects and non-pharmacologic comfort measures Outcome: Progressing   Problem: Health Behavior/Discharge Planning: Goal: Ability to manage health-related needs will improve Outcome: Progressing   Problem: Clinical Measurements: Goal: Ability to maintain clinical measurements within normal limits will improve Outcome: Progressing Goal: Will remain free from infection Outcome: Progressing Goal: Diagnostic test results will improve Outcome: Progressing Goal: Respiratory complications will improve Outcome: Progressing Goal: Cardiovascular complication will be avoided Outcome: Progressing   Problem: Activity: Goal: Risk for activity intolerance will decrease Outcome: Progressing   Problem: Coping: Goal: Level of anxiety will decrease Outcome: Progressing   Problem: Nutrition: Goal: Adequate nutrition will be maintained Outcome: Progressing   Problem: Elimination: Goal: Will not experience complications related to bowel motility Outcome: Progressing Goal: Will not experience complications related to urinary retention Outcome: Progressing   Problem: Skin Integrity: Goal: Risk for impaired skin integrity will decrease Outcome: Progressing   Problem: Safety: Goal: Ability to remain free from injury will improve Outcome: Progressing   Problem: Education: Goal: Ability to describe self-care measures that may prevent or decrease complications (Diabetes Survival Skills Education) will improve Outcome: Progressing Goal: Individualized Educational Video(s) Outcome: Progressing

## 2023-12-15 NOTE — Progress Notes (Signed)
PHARMACY - TOTAL PARENTERAL NUTRITION CONSULT NOTE  Indication:  delayed gastric emptying  Patient Measurements: Height: 4\' 10"  (147.3 cm) Weight: 57.2 kg (126 lb) IBW/kg (Calculated) : 40.9 TPN AdjBW (KG): 44.5 Body mass index is 26.33 kg/m. 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  Patient was started on a CLD on 12/6-12/9 and then advanced to a soft diet since 12/10.  Patient has had minimal PO intake since being post-op and continues to experience nausea. Delayed gastric emptying likely related to possible pancreatic leak.  Pharmacy consulted to dose TPN.  Glucose / Insulin: A1c 3.7 (11/15/23) - CBGs < 180. SSI D/C'ed 12/08/23. Electrolytes: low Na, K 3.5 post 4 runs, others WNL Renal: SCr < 1, BUN WNL Hepatic: LFTs / tbili WNL, albumin 2.2 Intake / Output; MIVF: UOP not charted (4 unmeasured occurences), NG reinserted 12/24 - , drain , emesis x1 12/24, LBM 12/24 (s/p Reglan x3 doses 12/11-12/12, restarted 12/15) GI Imaging: 12/07 Ab XR: unremarkable bowel gas pattern. 12/11 CT: possible post-op seroma or hematoma vs abscess, distal colonic diverticulosis 12/15 Ab XR: expected post-op changes, NGT in correct place GI Surgeries / Procedures:  12/3 diagnostic laparoscopy, Whipple procedure, pancreatic duct stent 12/13 subhepatic fluid collection aspiration and drainage  Central access: PICC placed 12/01/23 TPN start date: 12/02/23  Nutritional Goals: Clinimix E 8/10 1L at 42 ml/hr on Sun/Tues/Wed/Thurs/Fri/Sat Clinimix E 8/10 2L at 83 ml/hr on Mon SMOF lipids 250 ml daily  Provides 100% AA goal and ~95% of kCal goal  Electrolytes in Clinimix E 1L bag: Na , K , Mag , Ca 4.6mEq, Phos , Ac , Cl  Electrolytes in Clnimix E 8/10 2L bag: Na 70 mEq, K 60 mEq, Mag 10 mEq, Ca 9 mEq, Phos 30 mmol,  Acetate 166 , Cl 152 mEq   RD Estimated Needs Total Energy Estimated Needs: 1400-1700 kcal/d Total Protein Estimated Needs: 65-80g/d Total Fluid Estimated Needs: 1.5-1.7L/d  Current Nutrition:  TPN FLD - minimal intake  Ensure Enlive TID - none charted given yesterday  Plan:  Clinimix E 8/10 1L at 42 ml/hr on Sun/Tues/Wed/Thurs/Fri/Sat Clinimix E 8/10 2L at 83 ml/hr on Mon only SMOFlipid daily  Provides a weekly average of 1257 kCal and 92g AA per day, meeting 90% of minimal kCal and 115% of protein needs Add MVI and trace elements to TPN KCL x 5 runs and Mag sulfate 4gm IV per MD Monitor TPN labs on Mon/Thurs - labs in AM F/U ability to advance diet   Leylani Duley D. Laney Potash, PharmD, BCPS, BCCCP 12/15/2023, 8:50 AM

## 2023-12-15 NOTE — Progress Notes (Signed)
Pt's NGT was out upon receiving report at shift change. Per day shift RN, MD was notified that NGT was pulled out. MD made rounds ,and verbally instructed this writer that the NG Tube should be left out, but should pt starts vomiting, NGT should be inserted back. Pt had episode of vomiting. NG Tube placed with the assistance of TRN Autumn Dennis.  Bloody drainage notified. MD notified. MD stated that its not new, its an ongoing thing. PRN nausea med and pain med administered. Will continue monitoring.

## 2023-12-15 NOTE — Progress Notes (Addendum)
..  Trauma Event Note  Primary RN Venita Sheffield request assistance with placing NG tube.  NG tube placed on first attempt without difficutly in R Nare, secured with device and pink tape, pt understands need for NG placement. Abd xray ordered for placement confirmation dark red output, Dr. Hillery Hunter notified, no new orders at this time.   Last imported Vital Signs BP (!) 176/72 (BP Location: Left Arm)   Pulse 81   Temp 98.1 F (36.7 C) (Oral)   Resp 18   Ht 4\' 10"  (1.473 m)   Wt 126 lb (57.2 kg)   SpO2 96%   BMI 26.33 kg/m   Trending CBC Recent Labs    12/13/23 0551 12/14/23 0502 12/15/23 0505  WBC 8.1 9.7 7.8  HGB 8.9* 9.6* 10.3*  HCT 27.9* 30.1* 31.3*  PLT 286 291 350    Trending Coag's No results for input(s): "APTT", "INR" in the last 72 hours.  Trending BMET Recent Labs    12/13/23 0551 12/15/23 0712  NA 134* 133*  K 3.4* 3.5  CL 108 105  CO2 21* 22  BUN 12 12  CREATININE 0.48 0.41*  GLUCOSE 120* 113*      Daysha Ashmore Dee  Trauma Response RN  Please call TRN at [phone] for further assistance.

## 2023-12-16 ENCOUNTER — Inpatient Hospital Stay (HOSPITAL_COMMUNITY): Payer: Medicare Other

## 2023-12-16 LAB — CBC
HCT: 26.4 % — ABNORMAL LOW (ref 36.0–46.0)
Hemoglobin: 8.5 g/dL — ABNORMAL LOW (ref 12.0–15.0)
MCH: 30 pg (ref 26.0–34.0)
MCHC: 32.2 g/dL (ref 30.0–36.0)
MCV: 93.3 fL (ref 80.0–100.0)
Platelets: 381 10*3/uL (ref 150–400)
RBC: 2.83 MIL/uL — ABNORMAL LOW (ref 3.87–5.11)
RDW: 15.9 % — ABNORMAL HIGH (ref 11.5–15.5)
WBC: 11.5 10*3/uL — ABNORMAL HIGH (ref 4.0–10.5)
nRBC: 0 % (ref 0.0–0.2)

## 2023-12-16 LAB — COMPREHENSIVE METABOLIC PANEL
ALT: 27 U/L (ref 0–44)
AST: 25 U/L (ref 15–41)
Albumin: 2.4 g/dL — ABNORMAL LOW (ref 3.5–5.0)
Alkaline Phosphatase: 74 U/L (ref 38–126)
Anion gap: 7 (ref 5–15)
BUN: 23 mg/dL (ref 8–23)
CO2: 20 mmol/L — ABNORMAL LOW (ref 22–32)
Calcium: 8.9 mg/dL (ref 8.9–10.3)
Chloride: 105 mmol/L (ref 98–111)
Creatinine, Ser: 0.38 mg/dL — ABNORMAL LOW (ref 0.44–1.00)
GFR, Estimated: >60 mL/min (ref >60)
Glucose, Bld: 146 mg/dL — ABNORMAL HIGH (ref 70–99)
Potassium: 4.3 mmol/L (ref 3.5–5.1)
Sodium: 132 mmol/L — ABNORMAL LOW (ref 135–145)
Total Bilirubin: 0.3 mg/dL (ref <1.2)
Total Protein: 5.3 g/dL — ABNORMAL LOW (ref 6.5–8.1)

## 2023-12-16 LAB — MAGNESIUM: Magnesium: 2.5 mg/dL — ABNORMAL HIGH (ref 1.7–2.4)

## 2023-12-16 LAB — PHOSPHORUS: Phosphorus: 4 mg/dL (ref 2.5–4.6)

## 2023-12-16 MED ORDER — TRACE MINERALS CU-MN-SE-ZN 300-55-60-3000 MCG/ML IV SOLN
INTRAVENOUS | Status: AC
  Filled 2023-12-16 (×2): qty 1000

## 2023-12-16 MED ORDER — IOHEXOL 350 MG/ML SOLN
50.0000 mL | Freq: Once | INTRAVENOUS | Status: AC | PRN
  Administered 2023-12-16: 50 mL via INTRAVENOUS

## 2023-12-16 MED ORDER — FAT EMUL FISH OIL/PLANT BASED 20% (SMOFLIPID)IV EMUL
250.0000 mL | INTRAVENOUS | Status: AC
  Administered 2023-12-16: 250 mL via INTRAVENOUS
  Filled 2023-12-16: qty 250

## 2023-12-16 MED ORDER — SUCRALFATE 1 GM/10ML PO SUSP
1.0000 g | Freq: Three times a day (TID) | ORAL | Status: DC
  Administered 2023-12-16 – 2024-01-13 (×81): 1 g
  Filled 2023-12-16 (×87): qty 10

## 2023-12-16 NOTE — Progress Notes (Signed)
Physical Therapy Treatment Patient Details Name: Christine Morse MRN: [id number] DOB: January 20, 1936 Today's Date: 12/16/2023   History of Present Illness Patient is an 87 y/o female admitted 11/23/23 with diagnosis of duodenal adenoma and underwent laparoscopic Whipple procedure. post-op subcapsular fluid collection around the anterior aspect of the left liver lobe. Drain placement in IR 12/03/23. NGT inserted 12/15, dislodged then replaced.  Positive for R subclavian and axillary DVT on 12/07/23. 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    PT Comments  Good progress toward goals.  Emphasis on safe sit to stand into the RW and progression of gait stability and stamina.     If plan is discharge home, recommend the following: A little help with walking and/or transfers;Assistance with cooking/housework;Assist for transportation;Help with stairs or ramp for entrance;A little help with bathing/dressing/bathroom   Can travel by private vehicle     Yes  Equipment Recommendations  Rolling walker (2 wheels);BSC/3in1    Recommendations for Other Services       Precautions / Restrictions Precautions Precautions: Fall Precaution Comments: x2 blake drains, x1 JP drain     Mobility  Bed Mobility               General bed mobility comments: OOB in the recliner on arrival and back to the chair.    Transfers Overall transfer level: Needs assistance Equipment used: Rolling walker (2 wheels) Transfers: Sit to/from Stand Sit to Stand: Min assist           General transfer comment: cues for hand placement, forward and boost assist    Ambulation/Gait Ambulation/Gait assistance: Min assist, Contact guard assist Gait Distance (Feet): 280 Feet Assistive device: Rolling walker (2 wheels) Gait Pattern/deviations: Step-through pattern   Gait velocity interpretation: <1.8 ft/sec, indicate of risk for recurrent falls   General Gait Details: short,  though steady gait pattern, flexed posture.   Stairs             Wheelchair Mobility     Tilt Bed    Modified Rankin (Stroke Patients Only)       Balance Overall balance assessment: Needs assistance Sitting-balance support: Feet supported Sitting balance-[name]: Good       Standing balance-[name]: Poor Standing balance comment: UE support for balance                            Cognition Arousal: Alert Behavior During Therapy: Flat affect Overall Cognitive Status:  (NT formally)                       Memory: Decreased short-term memory                  Exercises      General Comments        Pertinent Vitals/Pain Pain Assessment Pain Assessment: Faces Faces Pain Scale: Hurts a little bit Pain Location: abdomen Pain Descriptors / Indicators: Discomfort, Guarding Pain Intervention(s): Monitored during session    Home Living                          Prior Function            PT Goals (current goals can now be found in the care plan section) Acute Rehab PT Goals PT Goal Formulation: With patient/family Time For Goal Achievement: 12/22/23 Potential to Achieve Goals: Fair Progress towards PT  goals: Progressing toward goals    Frequency    Min 1X/week      PT Plan      Co-evaluation              AM-PAC PT "6 Clicks" Mobility   Outcome Measure  Help needed turning from your back to your side while in a flat bed without using bedrails?: A Little Help needed moving from lying on your back to sitting on the side of a flat bed without using bedrails?: A Little Help needed moving to and from a bed to a chair (including a wheelchair)?: A Little Help needed standing up from a chair using your arms (e.g., wheelchair or bedside chair)?: A Little Help needed to walk in hospital room?: A Little Help needed climbing 3-5 steps with a railing? : Total 6 Click Score: 16    End of Session    Activity Tolerance: Patient tolerated treatment well Patient left: in chair;with chair alarm set;with call bell/phone within reach Nurse Communication: Mobility status PT Visit Diagnosis: Other abnormalities of gait and mobility (R26.89);Difficulty in walking, not elsewhere classified (R26.2)     Time: 4401-0272 PT Time Calculation (min) (ACUTE ONLY): 16 min  Charges:    $Gait Training: 8-22 mins PT General Charges $$ ACUTE PT VISIT: 1 Visit                     12/16/2023  Jacinto Halim., PT Acute Rehabilitation Services [phone]  (office)   Eliseo Gum Alyiah Ulloa 12/16/2023, 6:06 PM

## 2023-12-16 NOTE — Progress Notes (Signed)
Pt has order for staples to be removed. Pt pre-medicated before removing staples. Pt had staples removed by this nurse. Tolerated procedure well, no s/s of infection, no bleeding noted during the removal. Reva Bores 12/16/23 5:04 PM

## 2023-12-16 NOTE — Progress Notes (Addendum)
23 Days Post-Op   Subjective/Chief Complaint: CC NGT fell out yesterday and patient vomited. NGT replaced. Dark red output noted. 2.2L/12 hours out from NGT. Patient denies abdominal pain currently. BM yesterday - bloody. Voiding. Did not get oob yesterday.  Voiding without issues. She reports she is not sure if she get oob yesterday - she was having a bad day but is feeling better today.   Just returned from CT. Appears this was ordered by IR.   Afebrile. No tachycardia or hypotension. On RA.   Objective: Vital signs in last 24 hours: Temp:  [97.8 F (36.6 C)-99 F (37.2 C)] 97.9 F (36.6 C) (12/26 0759) Pulse Rate:  [77-95] 80 (12/26 0759) Resp:  [18-25] 19 (12/26 0759) BP: (125-176)/(51-72) 125/51 (12/26 1000) SpO2:  [95 %-98 %] 95 % (12/26 0759) Weight:  [57.2 kg] 57.2 kg (12/26 0500) Last BM Date : 12/15/23  Intake/Output from previous day: 12/25 0701 - 12/26 0700 In: 839.4 [I.V.:639.4; IV Piggyback:200] Out: 2880 [Emesis/NG output:2650; Drains:230] Intake/Output this shift: No intake/output data recorded.  General appearance: sitting up in chair. alert, cooperative. MAE's Resp: breathing comfortably GI: soft, non distended. Upper abdominal ttp diffusely. No lower abdominal ttp. No rigidity or guarding. +BS. Staples still in place, cdi without erythema or drainage. NGT with dark red fluid   D1 - LLQ - 100cc (from 0cc yesterday) - red tinged fluid in tubing, scant brown, bilious appearing fluid in gravity bag - this was reported to be serous yesterday.  D2 RLQ - 100cc (from 225cc yesterday ) - cloudy, brown, bilious appearing fluid in gravity bag  D3 R abd IR drain - 30cc (from 10cc yesterday) - serous Extremities: No LE edema  Lab Results:  Recent Labs    12/15/23 0505 12/16/23 0448  WBC 7.8 11.5*  HGB 10.3* 8.5*  HCT 31.3* 26.4*  PLT 350 381    BMET Recent Labs    12/15/23 0712 12/16/23 0448  NA 133* 132*  K 3.5 4.3  CL 105 105  CO2 22 20*  GLUCOSE 113*  146*  BUN 12 23  CREATININE 0.41* 0.38*  CALCIUM 8.5* 8.9   PT/INR No results for input(s): "LABPROT", "INR" in the last 72 hours.   ABG No results for input(s): "PHART", "HCO3" in the last 72 hours.  Invalid input(s): "PCO2", "PO2"   Studies/Results: DG Abdomen 1 View Result Date: 12/15/2023 CLINICAL DATA:  NG tube placement EXAM: ABDOMEN - 1 VIEW COMPARISON:  12/14/2023 FINDINGS: Limited field of view for tube placement verification. An enteric tube is present with tip projecting over the left upper quadrant consistent with location in the body of the stomach. A right central venous catheter is present with tip over the cavoatrial junction region. Surgical drains in the upper abdomen. Skin clips are consistent with recent surgery. Visualized bowel gas pattern is normal. Small left pleural effusion. Degenerative changes in the spine. IMPRESSION: Enteric tube tip projects over the left upper quadrant consistent with location in the body of the stomach. Electronically Signed   By: Burman Nieves M.D.   On: 12/15/2023 23:36      Anti-infectives: Anti-infectives (From admission, onward)    Start     Dose/Rate Route Frequency Ordered Stop   12/13/23 1700  piperacillin-tazobactam (ZOSYN) IVPB 3.375 g        3.375 g 12.5 mL/hr over 240 Minutes Intravenous Every 8 hours 12/13/23 1412 12/13/23 2139   11/30/23 1230  fluconazole (DIFLUCAN) IVPB 400 mg  400 mg 100 mL/hr over 120 Minutes Intravenous Every 24 hours 11/30/23 1135     11/26/23 0945  piperacillin-tazobactam (ZOSYN) IVPB 3.375 g  Status:  Discontinued        3.375 g 12.5 mL/hr over 240 Minutes Intravenous Every 8 hours 11/26/23 0853 12/13/23 1412   11/23/23 2100  ceFAZolin (ANCEF) IVPB 2g/100 mL premix        2 g 200 mL/hr over 30 Minutes Intravenous Every 8 hours 11/23/23 1654 11/23/23 2214   11/23/23 0700  ceFAZolin (ANCEF) IVPB 2g/100 mL premix        2 g 200 mL/hr over 30 Minutes Intravenous On call to O.R.  11/23/23 0654 11/23/23 1249       Assessment/Plan: Christine Morse is an 87 year old female status post Whipple Procedure on 11/23/2023, (POD 23).  Final pathology - adenocarcinoma duodenum, positive margin, positive LN - pT3pN2cM0 Abdominal fluid collection - CT aspiration and drain placement 12/13 (anterior hepatic fluid collection) - serosang. Rare candida seen on final culture. Completed Zosyn. On fluconazole. Monitor  Pancreatic leak - Cont surgical and IR drains. Noted change in output and fluid characteristics for left sided surgical drain. WBC 11.5. F/u CT today pending.  Delayed gastric emptying. Qtc ok.  Continue reglan. NGT and NPO. Ok to have sips of clears for comfort. TPN Will discuss with MD if we can remove staples today.   HOCM - continue beta blockade and treat hypertension. IV metoprolol to q4h instead of q6 per Cardiology.   Anemia of chronic disease, chronic blood loss anemia, and acute blood loss anemia - Hgb 8.5 from 10.3. Bloody ngt output - BID PPI. Is on therapeutic Lovenox - cont for now. HDS - is on BB. If does not resolve, may need GI consult. Monitor. Hyperglycemia - improving, monitor   Hypophosphatemia - resolved on last check, getting checked M/Th for TPN labs.   Hypokalemia - resolved. Pharmacy monitoring.  Severe protein calorie malnutrition - TNA. NGT and NPO RUE DVT - treatment dose lovenox, reviewed with vascular and will continue PICC for now OOB, IS, PT consult.   FEN - TPN VTE - lovenox treatment dose. Keep picc.   ID - Fluconazole   LOS: 23 days   Christine Morse, [facility] 12/16/2023, 10:43 AM Please see Amion for pager number during day hours 7:00am-4:30pm

## 2023-12-16 NOTE — Progress Notes (Addendum)
Referring Physician(s): Donell Beers, F.   Supervising Physician: Irish Lack  Patient Status:  [facility] - In-pt  Chief Complaint:  87 year old female status post Whipple Procedure on 11/23/2023  Found to have perihepatic fluid collection S/p 10 Fr drain placement by Dr. Elby Showers on 12/13  Subjective:  Patient sitting in a recliner, NAD. No complaints today.  Informed the patient about CT result.   Allergies: Codeine  Medications: Prior to Admission medications   Medication Sig Start Date End Date Taking? Authorizing Provider  acetaminophen (TYLENOL) 500 MG tablet Take 1,000 mg by mouth every 6 (six) hours as needed for mild pain, moderate pain or headache.   Yes [provider]  Calcium Carbonate-Vitamin D (CALCIUM + D PO) Take 2 tablets by mouth daily.    Yes [provider]  ferrous sulfate 325 (65 FE) MG tablet Take 1 tablet (325 mg total) by mouth daily with breakfast. 10/05/13  Yes Christiane Ha, MD  metoprolol succinate (TOPROL-XL) 25 MG 24 hr tablet Take 0.5 tablets (12.5 mg total) by mouth daily. 10/27/23 11/26/23 Yes Pahwani, Daleen Bo, MD  pantoprazole (PROTONIX) 40 MG tablet Take 1 tablet (40 mg total) by mouth 2 (two) times daily. 11/08/23 05/06/24 Yes Almon Hercules, MD  rosuvastatin (CRESTOR) 5 MG tablet Take 5 mg by mouth daily. 07/10/22  Yes [provider]     Vital Signs: BP (!) 125/51 (BP Location: Left Arm)   Pulse 80   Temp 97.9 F (36.6 C) (Oral)   Resp 19   Ht 4\' 10"  (1.473 m)   Wt 126 lb (57.2 kg)   SpO2 95%   BMI 26.33 kg/m   Physical Exam Vitals reviewed.  Constitutional:      General: She is not in acute distress. HENT:     Head: Normocephalic and atraumatic.     Nose:     Comments: NGT to wall suction, brown materia coming out  Pulmonary:     Effort: Pulmonary effort is normal.  Abdominal:     General: Abdomen is flat.     Palpations: Abdomen is soft.     Comments: Positive RUQ drain to a suction bulb. Site is  unremarkable with no erythema, edema, tenderness, bleeding or drainage. Suture and stat lock in place. Dressing is clean, dry, and intact. ~10 ml of  simple serous fluid noted in the bulb. Drain flushes with little resistance, does not aspirate.   Musculoskeletal:     Cervical back: Neck supple.  Skin:    General: Skin is warm and dry.  Neurological:     Mental Status: She is alert.  Psychiatric:        Mood and Affect: Mood normal.        Behavior: Behavior normal.     Imaging: DG Abdomen 1 View Result Date: 12/15/2023 CLINICAL DATA:  NG tube placement EXAM: ABDOMEN - 1 VIEW COMPARISON:  12/14/2023 FINDINGS: Limited field of view for tube placement verification. An enteric tube is present with tip projecting over the left upper quadrant consistent with location in the body of the stomach. A right central venous catheter is present with tip over the cavoatrial junction region. Surgical drains in the upper abdomen. Skin clips are consistent with recent surgery. Visualized bowel gas pattern is normal. Small left pleural effusion. Degenerative changes in the spine. IMPRESSION: Enteric tube tip projects over the left upper quadrant consistent with location in the body of the stomach. Electronically Signed   By: Burman Nieves  M.D.   On: 12/15/2023 23:36   DG Abd 1 View Result Date: 12/14/2023 CLINICAL DATA:  NGT placement EXAM: ABDOMEN - 1 VIEW COMPARISON:  12/05/2023. FINDINGS: Limited image including only the upper abdomen demonstrates an NG tube tip superimposed with stomach below the diaphragm. IMPRESSION: NG tube in place. Electronically Signed   By: Layla Maw M.D.   On: 12/14/2023 09:44    Labs:  CBC: Recent Labs    12/13/23 0551 12/14/23 0502 12/15/23 0505 12/16/23 0448  WBC 8.1 9.7 7.8 11.5*  HGB 8.9* 9.6* 10.3* 8.5*  HCT 27.9* 30.1* 31.3* 26.4*  PLT 286 291 350 381    COAGS: Recent Labs    11/15/23 1115 11/24/23 0405 11/25/23 0510 12/03/23 0544  INR 1.0 1.4*  1.3* 1.2    BMP: Recent Labs    12/12/23 0455 12/13/23 0551 12/15/23 0712 12/16/23 0448  NA 137 134* 133* 132*  K 3.3* 3.4* 3.5 4.3  CL 109 108 105 105  CO2 21* 21* 22 20*  GLUCOSE 126* 120* 113* 146*  BUN 12 12 12 23   CALCIUM 8.3* 8.0* 8.5* 8.9  CREATININE 0.55 0.48 0.41* 0.38*  GFRNONAA >60 >60 >60 >60    LIVER FUNCTION TESTS: Recent Labs    12/06/23 0537 12/09/23 0938 12/13/23 0551 12/16/23 0448  BILITOT 0.6 0.5 0.4 0.3  AST 19 18 20 25   ALT 23 19 19 27   ALKPHOS 87 93 77 74  PROT 4.9* 5.7* 4.9* 5.3*  ALBUMIN 2.3* 2.4* 2.2* 2.4*    Assessment and Plan:  87 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 Elby Showers on 12/13 - 80 mL of serosanguineous fluid was aspirated   VSS afebrile  Mild leukocytosis today 11.5  Cx showed rare candida  Output was low till 12/24, but jumped to 30 mL on 12/25. - output appears simple serous today   Drain Location: RUQ Size: Fr size: 10 Fr Date of placement: 12/13  Currently to: Drain collection device: suction bulb 24 hour output:  Output by Drain (mL) 12/14/23 0701 - 12/14/23 1900 12/14/23 1901 - 12/15/23 0700 12/15/23 0701 - 12/15/23 1900 12/15/23 1901 - 12/16/23 0700 12/16/23 0701 - 12/16/23 1101  Closed System Drain 1 Right RLQ Other (Comment) 19 Fr. 125 100 50 50   Closed System Drain 1 Left LLQ Other (Comment) 19 Fr. 0 0 50 50   Closed System Drain Right Abdomen Bulb (JP) 10 Fr. 0 10 30      Interval imaging/drain manipulation:  12/26: CT A/P with iv contrast   Current examination: Flushes/aspirates easily.  Insertion site unremarkable. Suture and stat lock in place. Dressed appropriately.   Plan: Reviewed CT with Dr. Fredia Sorrow, near resolution of the perihepatic fluid collection noted. With sudden increase output on 12/25, will keep the drain in place for now. Anticipating removal early next week if  output remains low over the weekend.   Continue TID flushes with 5 cc NS. Record output Q shift. Dressing changes QD or PRN if soiled.  Call IR APP or on call IR MD if difficulty flushing or sudden change in drain output.  Repeat imaging/possible drain injection once output < 10 mL/QD (excluding flush material). Consideration for drain removal if output is < 10 mL/QD (excluding flush material), pending discussion with the providing surgical service.  Discharge planning: Please contact IR APP or on call IR MD prior to patient d/c to ensure  appropriate follow up plans are in place. Typically patient will follow up with IR clinic 10-14 days post d/c for repeat imaging/possible drain injection. IR scheduler will contact patient with date/time of appointment. Patient will need to flush drain QD with 5 cc NS, record output QD, dressing changes every 2-3 days or earlier if soiled.   IR will continue to follow - please call with questions or concerns.   Electronically Signed: Willette Brace, PA-C 12/16/2023, 10:56 AM   I spent a total of 15 Minutes at the the patient's bedside AND on the patient's hospital floor or unit, greater than 50% of which was counseling/coordinating care for perihepatic drain f/u.  This chart was dictated using voice recognition software.  Despite best efforts to proofread,  errors can occur which can change the documentation meaning.

## 2023-12-16 NOTE — Progress Notes (Signed)
PHARMACY - TOTAL PARENTERAL NUTRITION CONSULT NOTE  Indication:  delayed gastric emptying  Patient Measurements: Height: 4\' 10"  (147.3 cm) Weight: 57.2 kg (126 lb) IBW/kg (Calculated) : 40.9 TPN AdjBW (KG): 44.5 Body mass index is 26.33 kg/m. 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  Patient was started on a CLD on 12/6-12/9 and then advanced to a soft diet since 12/10.  Patient has had minimal PO intake since being post-op and continues to experience nausea. Delayed gastric emptying likely related to possible pancreatic leak.  Pharmacy consulted to dose TPN.  Glucose / Insulin: A1c 3.7 (11/15/23) - CBGs < 180. SSI D/C'ed 12/08/23. Electrolytes: Na 132, K 4.3, Mag 2.5, Phos 4.0, Ca 8.9 Renal: SCr < 1, BUN WNL Hepatic: LFTs / tbili WNL, albumin 2.4 Intake / Output; MIVF: UOP not charted (8 unmeasured occurences), NG reinserted 12/24 - 2650 mL, drain 50 mL, LBM 12/25 (s/p Reglan x3 doses 12/11-12/12, restarted 12/15) GI Imaging: 12/07 Ab XR: unremarkable bowel gas pattern. 12/11 CT: possible post-op seroma or hematoma vs abscess, distal colonic diverticulosis 12/15 Ab XR: expected post-op changes, NGT in correct place GI Surgeries / Procedures:  12/3 diagnostic laparoscopy, Whipple procedure, pancreatic duct stent 12/13 subhepatic fluid collection aspiration and drainage  Central access: PICC placed 12/01/23 TPN start date: 12/02/23  Nutritional Goals: Clinimix E 8/10 1L at 42 ml/hr on Sun/Tues/Wed/Thurs/Fri/Sat Clinimix E 8/10 2L at 83 ml/hr on Mon SMOF lipids 250 ml daily  Provides 100% AA goal and ~95% of kCal goal  Electrolytes in Clinimix E 1L bag: Na , K , Mag , Ca 4.79mEq, Phos , Ac , Cl  Electrolytes in Clnimix E 8/10 2L bag: Na 70 mEq, K 60 mEq, Mag 10 mEq, Ca 9 mEq, Phos 30 mmol, Acetate 166  , Cl 152 mEq   RD Estimated Needs Total Energy Estimated Needs: 1400-1700 kcal/d Total Protein Estimated Needs: 65-80g/d Total Fluid Estimated Needs: 1.5-1.7L/d  Current Nutrition:  TPN FLD - minimal intake  Ensure Enlive TID - none charted given yesterday  Plan:  Clinimix E 8/10 1L at 42 ml/hr on Sun/Tues/Wed/Thurs/Fri/Sat Clinimix E 8/10 2L at 83 ml/hr on Mon only SMOFlipid daily  Provides a weekly average of 1257 kCal and 92g AA per day, meeting 90% of minimal kCal and 115% of protein needs Add MVI and trace elements to TPN Monitor TPN labs on Mon/Thurs F/U ability to advance diet   Clarene Curran BS, PharmD, BCPS Clinical Pharmacist 12/16/2023 7:10 AM  Contact: [phone] after 3 PM  "Be curious, not judgmental..." -Debbora Dus

## 2023-12-16 NOTE — Plan of Care (Signed)

## 2023-12-17 LAB — CBC
HCT: 22.6 % — ABNORMAL LOW (ref 36.0–46.0)
HCT: 31.9 % — ABNORMAL LOW (ref 36.0–46.0)
Hemoglobin: 7.4 g/dL — ABNORMAL LOW (ref 12.0–15.0)
Hemoglobin: 10 g/dL — ABNORMAL LOW (ref 12.0–15.0)
MCH: 30.8 pg (ref 26.0–34.0)
MCH: 29 pg (ref 26.0–34.0)
MCHC: 32.7 g/dL (ref 30.0–36.0)
MCHC: 31.3 g/dL (ref 30.0–36.0)
MCV: 94.2 fL (ref 80.0–100.0)
MCV: 92.5 fL (ref 80.0–100.0)
Platelets: 347 10*3/uL (ref 150–400)
Platelets: 408 10*3/uL — ABNORMAL HIGH (ref 150–400)
RBC: 2.4 MIL/uL — ABNORMAL LOW (ref 3.87–5.11)
RBC: 3.45 MIL/uL — ABNORMAL LOW (ref 3.87–5.11)
RDW: 15.8 % — ABNORMAL HIGH (ref 11.5–15.5)
RDW: 17.7 % — ABNORMAL HIGH (ref 11.5–15.5)
WBC: 10.3 10*3/uL (ref 4.0–10.5)
WBC: 11.4 10*3/uL — ABNORMAL HIGH (ref 4.0–10.5)
nRBC: 0 % (ref 0.0–0.2)
nRBC: 0 % (ref 0.0–0.2)

## 2023-12-17 LAB — PREPARE RBC (CROSSMATCH)

## 2023-12-17 MED ORDER — TRACE MINERALS CU-MN-SE-ZN 300-55-60-3000 MCG/ML IV SOLN
INTRAVENOUS | Status: AC
  Filled 2023-12-17 (×2): qty 1000

## 2023-12-17 MED ORDER — TRACE MINERALS CU-MN-SE-ZN 300-55-60-3000 MCG/ML IV SOLN: INTRAVENOUS | Status: DC

## 2023-12-17 MED ORDER — FAT EMUL FISH OIL/PLANT BASED 20% (SMOFLIPID)IV EMUL: 250.0000 mL | INTRAVENOUS | Status: DC

## 2023-12-17 MED ORDER — SODIUM CHLORIDE 0.9% IV SOLUTION: Freq: Once | INTRAVENOUS | Status: AC

## 2023-12-17 MED ORDER — FAT EMUL FISH OIL/PLANT BASED 20% (SMOFLIPID)IV EMUL
250.0000 mL | INTRAVENOUS | Status: AC
  Administered 2023-12-17: 250 mL via INTRAVENOUS
  Filled 2023-12-17: qty 250

## 2023-12-17 NOTE — Progress Notes (Signed)
Supervising Physician: Gilmer Mor  Patient Status:  [facility] - In-pt  Chief Complaint:  Duodenal adenocarcinoma s/p Whipple procedure with post-op subcapsular fluid collection around the anterior aspect of the left liver lobe.   Brief History:  87 year old female status post Whipple Procedure on 11/23/2023  Found to have perihepatic fluid collection S/p 10 Fr drain placement by Dr. Elby Showers on 12/13  Subjective:  Patient sitting in a recliner, NAD. No complaints today. Family with patient.  Allergies: Codeine  Medications: Prior to Admission medications   Medication Sig Start Date End Date Taking? Authorizing Provider  acetaminophen (TYLENOL) 500 MG tablet Take 1,000 mg by mouth every 6 (six) hours as needed for mild pain, moderate pain or headache.   Yes [provider]  Calcium Carbonate-Vitamin D (CALCIUM + D PO) Take 2 tablets by mouth daily.    Yes [provider]  ferrous sulfate 325 (65 FE) MG tablet Take 1 tablet (325 mg total) by mouth daily with breakfast. 10/05/13  Yes Christiane Ha, MD  metoprolol succinate (TOPROL-XL) 25 MG 24 hr tablet Take 0.5 tablets (12.5 mg total) by mouth daily. 10/27/23 11/26/23 Yes Pahwani, Daleen Bo, MD  pantoprazole (PROTONIX) 40 MG tablet Take 1 tablet (40 mg total) by mouth 2 (two) times daily. 11/08/23 05/06/24 Yes Almon Hercules, MD  rosuvastatin (CRESTOR) 5 MG tablet Take 5 mg by mouth daily. 07/10/22  Yes [provider]     Vital Signs: BP (!) 149/57 (BP Location: Left Arm)   Pulse 83   Temp 97.8 F (36.6 C) (Oral)   Resp 17   Ht 4\' 10"  (1.473 m)   Wt 126 lb (57.2 kg)   SpO2 99%   BMI 26.33 kg/m   Physical Exam Constitutional:      General: She is not in acute distress. Pulmonary:     Effort: Pulmonary effort is normal.  Abdominal:     Tenderness: There is abdominal tenderness.     Comments: Epigastric abdominal drain to suction. Suture and dressing in place. Site nontender. Minimal output in  drain. Drain flushes well. There are two other surgical drains to gravity.    Skin:    General: Skin is warm and dry.  Neurological:     Mental Status: She is alert and oriented to person, place, and time.      Labs:  CBC: Recent Labs    12/14/23 0502 12/15/23 0505 12/16/23 0448 12/17/23 0621  WBC 9.7 7.8 11.5* 10.3  HGB 9.6* 10.3* 8.5* 7.4*  HCT 30.1* 31.3* 26.4* 22.6*  PLT 291 350 381 347    COAGS: Recent Labs    11/15/23 1115 11/24/23 0405 11/25/23 0510 12/03/23 0544  INR 1.0 1.4* 1.3* 1.2    BMP: Recent Labs    12/12/23 0455 12/13/23 0551 12/15/23 0712 12/16/23 0448  NA 137 134* 133* 132*  K 3.3* 3.4* 3.5 4.3  CL 109 108 105 105  CO2 21* 21* 22 20*  GLUCOSE 126* 120* 113* 146*  BUN 12 12 12 23   CALCIUM 8.3* 8.0* 8.5* 8.9  CREATININE 0.55 0.48 0.41* 0.38*  GFRNONAA >60 >60 >60 >60    LIVER FUNCTION TESTS: Recent Labs    12/06/23 0537 12/09/23 0938 12/13/23 0551 12/16/23 0448  BILITOT 0.6 0.5 0.4 0.3  AST 19 18 20 25   ALT 23 19 19 27   ALKPHOS 87 93 77 74  PROT 4.9* 5.7* 4.9* 5.3*  ALBUMIN 2.3* 2.4* 2.2* 2.4*    Assessment  and Plan:  Duodenal adenocarcinoma s/p Whipple procedure with post-op subcapsular fluid collection around the anterior aspect of the left liver lobe. Drain placement in IR 12/03/23   Down trending WBC, she is afebrile. Cultures grew candida albicans.  Approximately 40 cc output in the past 24 hours, 15 this morning.  Drain Location: Upper mid abdomen Size: Fr size: 10 Fr Date of placement: 12/03/23  Currently to: Drain collection device: gravity 24 hour output:  Output by Drain (mL) 12/15/23 0701 - 12/15/23 1900 12/15/23 1901 - 12/16/23 0700 12/16/23 0701 - 12/16/23 1900 12/16/23 1901 - 12/17/23 0700 12/17/23 0701 - 12/17/23 1053  Closed System Drain 1 Right RLQ Other (Comment) 19 Fr. 50 50 30 50 20  Closed System Drain 1 Left LLQ Other (Comment) 19 Fr. 50 50 20 50 50  Closed System Drain Right Abdomen Bulb (JP) 10  Fr. 30  30 10 15     Interval imaging/drain manipulation:  none  Current examination: Flushes/aspirates easily.  Insertion site unremarkable. Suture and stat lock in place. Dressed appropriately.   Plan: With sudden increase output on 12/25, will keep the drain in place for now. Anticipating removal early next week if output remains low over the weekend.   Continue TID flushes with 5 cc NS. Record output Q shift. Dressing changes QD or PRN if soiled.  Call IR APP or on call IR MD if difficulty flushing or sudden change in drain output.  Repeat imaging/possible drain injection once output < 10 mL/QD (excluding flush material). Consideration for drain removal if output is < 10 mL/QD (excluding flush material), pending discussion with the providing surgical service.  Discharge planning: Please contact IR APP or on call IR MD prior to patient d/c to ensure appropriate follow up plans are in place. Typically patient will follow up with IR clinic 10-14 days post d/c for repeat imaging/possible drain injection. IR scheduler will contact patient with date/time of appointment. Patient will need to flush drain QD with 5 cc NS, record output QD, dressing changes every 2-3 days or earlier if soiled.   IR will continue to follow - please call with questions or concerns.     Electronically Signed: Sable Feil, PA-C 12/17/2023, 10:46 AM     I spent a total of 15 Minutes at the the patient's bedside AND on the patient's hospital floor or unit, greater than 50% of which was counseling/coordinating care for perihepatic drain f/u.

## 2023-12-17 NOTE — Plan of Care (Signed)
  Problem: Education: Goal: Knowledge of General Education information will improve Description: Including pain rating scale, medication(s)/side effects and non-pharmacologic comfort measures Outcome: Progressing   Problem: Activity: Goal: Risk for activity intolerance will decrease Outcome: Progressing   Problem: Coping: Goal: Level of anxiety will decrease Outcome: Progressing   Problem: Safety: Goal: Ability to remain free from injury will improve Outcome: Progressing   Problem: Skin Integrity: Goal: Risk for impaired skin integrity will decrease Outcome: Progressing   Problem: Skin Integrity: Goal: Risk for impaired skin integrity will decrease Outcome: Progressing

## 2023-12-17 NOTE — Progress Notes (Signed)
24 Days Post-Op   Subjective/Chief Complaint: CC NGT functional. No nausea with tube.  Denies pain.  Feels "very tired."    Afebrile. No tachycardia or hypotension. On RA.   Objective: Vital signs in last 24 hours: Temp:  [96.5 F (35.8 C)-97.9 F (36.6 C)] 97.6 F (36.4 C) (12/27 1457) Pulse Rate:  [81-91] 86 (12/27 1457) Resp:  [17-24] 24 (12/27 1457) BP: (128-151)/(57-68) 131/64 (12/27 1457) SpO2:  [97 %-99 %] 99 % (12/27 1457) Weight:  [57.2 kg] 57.2 kg (12/27 0500) Last BM Date : 12/15/23  Intake/Output from previous day: 12/26 0701 - 12/27 0700 In: 625.4 [I.V.:625.4] Out: 1240 [Emesis/NG output:1050; Drains:190] Intake/Output this shift: Total I/O In: 15 [I.V.:15] Out: 385 [Emesis/NG output:300; Drains:85]  General appearance: sitting up in chair. Conversant but looks weak.  Resp: breathing comfortably GI: soft, non distended. Minimal tenderness.  No rigidity or guarding.  Drains look less purulent.    Lab Results:  Recent Labs    12/16/23 0448 12/17/23 0621  WBC 11.5* 10.3  HGB 8.5* 7.4*  HCT 26.4* 22.6*  PLT 381 347    BMET Recent Labs    12/15/23 0712 12/16/23 0448  NA 133* 132*  K 3.5 4.3  CL 105 105  CO2 22 20*  GLUCOSE 113* 146*  BUN 12 23  CREATININE 0.41* 0.38*  CALCIUM 8.5* 8.9   PT/INR No results for input(s): "LABPROT", "INR" in the last 72 hours.   ABG No results for input(s): "PHART", "HCO3" in the last 72 hours.  Invalid input(s): "PCO2", "PO2"   Studies/Results: CT ABDOMEN PELVIS W CONTRAST Result Date: 12/16/2023 CLINICAL DATA:  Status post percutaneous catheter drainage of anterior subcapsular perihepatic fluid collection on 12/03/2023 following recent Whipple procedure. Diminishing output via percutaneous drainage catheter and assessment for residual fluid collection. EXAM: CT ABDOMEN AND PELVIS WITH CONTRAST TECHNIQUE: Multidetector CT imaging of the abdomen and pelvis was performed using the standard protocol following  bolus administration of intravenous contrast. RADIATION DOSE REDUCTION: This exam was performed according to the departmental dose-optimization program which includes automated exposure control, adjustment of the mA and/or kV according to patient size and/or use of iterative reconstruction technique. CONTRAST:  50mL OMNIPAQUE IOHEXOL 350 MG/ML SOLN COMPARISON:  12/01/2023 FINDINGS: Lower chest: Small bilateral pleural effusions of fairly equal volume with associated bibasilar atelectasis. Hepatobiliary: Normal parenchymal enhancement without lesion. No biliary ductal dilatation. Absent gallbladder. Anterior subcapsular percutaneous drainage catheter adjacent to the left lobe with no significant discernible residual fluid collection by CT. Minimal adjacent trace fluid present just to the left of the drain. Pancreas: Status post Whipple procedure with resection of the pancreatic head. The residual body and tail appear unremarkable with no evidence of pancreatic ductal dilatation or peripancreatic fluid collection. Spleen: Normal in size without focal abnormality. Adrenals/Urinary Tract: Adrenal glands are unremarkable. Stable scattered small Bosniak 1 cysts in both kidneys requiring no follow-up and small nonobstructing bilateral renal calculi. Bladder is unremarkable. Stomach/Bowel: Gastric decompression tube extends below the diaphragm. Moderate hiatal hernia again noted. Postoperative appearance of bowel without obstruction or ileus. No free intraperitoneal air. Vascular/Lymphatic: Stable atherosclerosis of the abdominal aorta without aneurysm. No lymphadenopathy identified. Reproductive: Status post hysterectomy. No adnexal masses. Other: Small amount of free fluid in the pelvis. No new focal abscess. Surgical drain via left abdominal wall terminates in the right abdomen with small amount of adjacent fluid near its tip. Musculoskeletal: No acute or significant osseous findings. IMPRESSION: 1. Anterior subcapsular  percutaneous drainage catheter adjacent to the left  lobe of the liver with no significant discernible residual fluid collection by CT. Minimal adjacent trace fluid present just to the left of the drain. 2. Status post Whipple procedure with resection of the pancreatic head. The residual body and tail appear unremarkable with no evidence of pancreatic ductal dilatation or peripancreatic fluid collection. 3. Small bilateral pleural effusions of fairly equal volume with associated bibasilar atelectasis. 4. Small amount of free fluid in the pelvis. 5. Stable small nonobstructing bilateral renal calculi. 6. Stable atherosclerosis of the abdominal aorta without aneurysm. 7. Gastric decompression tube extends below the diaphragm. Moderate hiatal hernia again noted. Aortic Atherosclerosis (ICD10-I70.0). Electronically Signed   By: Irish Lack M.D.   On: 12/16/2023 14:54   DG Abdomen 1 View Result Date: 12/15/2023 CLINICAL DATA:  NG tube placement EXAM: ABDOMEN - 1 VIEW COMPARISON:  12/14/2023 FINDINGS: Limited field of view for tube placement verification. An enteric tube is present with tip projecting over the left upper quadrant consistent with location in the body of the stomach. A right central venous catheter is present with tip over the cavoatrial junction region. Surgical drains in the upper abdomen. Skin clips are consistent with recent surgery. Visualized bowel gas pattern is normal. Small left pleural effusion. Degenerative changes in the spine. IMPRESSION: Enteric tube tip projects over the left upper quadrant consistent with location in the body of the stomach. Electronically Signed   By: Burman Nieves M.D.   On: 12/15/2023 23:36      Anti-infectives: Anti-infectives (From admission, onward)    Start     Dose/Rate Route Frequency Ordered Stop   12/13/23 1700  piperacillin-tazobactam (ZOSYN) IVPB 3.375 g        3.375 g 12.5 mL/hr over 240 Minutes Intravenous Every 8 hours 12/13/23 1412  12/13/23 2139   11/30/23 1230  fluconazole (DIFLUCAN) IVPB 400 mg        400 mg 100 mL/hr over 120 Minutes Intravenous Every 24 hours 11/30/23 1135     11/26/23 0945  piperacillin-tazobactam (ZOSYN) IVPB 3.375 g  Status:  Discontinued        3.375 g 12.5 mL/hr over 240 Minutes Intravenous Every 8 hours 11/26/23 0853 12/13/23 1412   11/23/23 2100  ceFAZolin (ANCEF) IVPB 2g/100 mL premix        2 g 200 mL/hr over 30 Minutes Intravenous Every 8 hours 11/23/23 1654 11/23/23 2214   11/23/23 0700  ceFAZolin (ANCEF) IVPB 2g/100 mL premix        2 g 200 mL/hr over 30 Minutes Intravenous On call to O.R. 11/23/23 0654 11/23/23 1249       Assessment/Plan: Ms. Danzey is an 87 year old female status post Whipple Procedure on 11/23/2023, (POD 24).  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 Rare candida seen on final culture. Completed Zosyn. On fluconazole. Resolved on CT. Pancreatic leak - Cont surgical and IR drains. Noted change in output and fluid characteristics for left sided surgical drain. WBCs now normal.  Drain output going down.   Delayed gastric emptying. Qtc ok.  Continue reglan. NGT and NPO. Ok to have sips of clears for comfort. TPN.  HOCM - continue beta blockade and treat hypertension. IV metoprolol to q4h instead of q6 per Cardiology.   Anemia of chronic disease, chronic blood loss anemia, and acute blood loss anemia- Has drifted down, give 1 u pRBCs.  Hyperglycemia - improving, monitor   Hypophosphatemia - resolved Hypokalemia - resolved. Pharmacy monitoring.  Severe protein calorie malnutrition - TNA. NGT and NPO RUE DVT - treatment dose lovenox, reviewed with vascular and will continue PICC for now OOB, IS, PT consult.   FEN - TPN VTE - lovenox treatment dose. Keep picc.   ID - Fluconazole   LOS: 24 days   Almond Lint, MD [facility] 12/17/2023, 3:26 PM Please see Amion for pager number during day hours 7:00am-4:30pm

## 2023-12-17 NOTE — Progress Notes (Signed)
PT Cancellation Note  Patient Details Name: MAEDEAN GURGANIOUS MRN: [id number] DOB: [birth year]   Cancelled Treatment:    Reason Eval/Treat Not Completed: Patient not medically ready;Patient declined, no reason specified Not feeling well. 12/17/2023  Jacinto Halim., PT Acute Rehabilitation Services [phone]  (office)  Eliseo Gum Mir Fullilove 12/17/2023, 4:48 PM

## 2023-12-17 NOTE — TOC Progression Note (Addendum)
Transition of Care [facility]) - Progression Note    Patient Details  Name: Christine Morse MRN: [id number] Date of Birth: 1936-05-15  Transition of Care [facility]) CM/SW Contact  Eduard Roux, [place] Phone Number: 12/17/2023, 12:09 PM  Clinical Narrative:     TOC continues to follow, patient remains on TPN.     Barriers to Discharge: Continued Medical Work up  Expected Discharge Plan and Services In-house Referral: Clinical Social Work   Post Acute Care Choice: Skilled Nursing Facility Living arrangements for the past 2 months: Single Family Home                                       Social Determinants of Health (SDOH) Interventions SDOH Screenings   Food Insecurity: No Food Insecurity (11/23/2023)  Housing: Patient Declined (11/23/2023)  Transportation Needs: No Transportation Needs (11/23/2023)  Utilities: Not At Risk (11/23/2023)  Tobacco Use: Low Risk  (11/23/2023)  Recent Concern: Tobacco Use - Medium Risk (11/07/2023)    Readmission Risk Interventions    10/27/2023    9:03 AM  Readmission Risk Prevention Plan  Transportation Screening Complete  PCP or Specialist Appt within 5-7 Days Complete  Home Care Screening Complete  Medication Review (RN CM) Complete

## 2023-12-17 NOTE — Progress Notes (Signed)
PHARMACY - TOTAL PARENTERAL NUTRITION CONSULT NOTE  Indication:  delayed gastric emptying  Patient Measurements: Height: 4\' 10"  (147.3 cm) Weight: 57.2 kg (126 lb) IBW/kg (Calculated) : 40.9 TPN AdjBW (KG): 44.5 Body mass index is 26.33 kg/m. 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  Patient was started on a CLD on 12/6-12/9 and then advanced to a soft diet since 12/10.  Patient has had minimal PO intake since being post-op and continues to experience nausea. Delayed gastric emptying likely related to possible pancreatic leak.  Pharmacy consulted to dose TPN.  Glucose / Insulin: A1c 3.7 (11/15/23) - CBGs < 180. SSI D/C'ed 12/08/23. Electrolytes: Na 132, K 4.3, Mag 2.5, Phos 4.0, Ca 8.9 Renal: SCr < 1, BUN WNL Hepatic: LFTs / tbili WNL, albumin 2.4 Intake / Output; MIVF: UOP not charted (2 unmeasured occurences), NG reinserted 12/24 - 950 mL, drain 190 mL, LBM 12/25 (s/p Reglan x3 doses 12/11-12/12, restarted 12/15) GI Imaging: 12/07 Ab XR: unremarkable bowel gas pattern. 12/11 CT: possible post-op seroma or hematoma vs abscess, distal colonic diverticulosis 12/15 Ab XR: expected post-op changes, NGT in correct place 12/26- CT ABD- perc drain cath adj left lobe liver- no discernable residual fluid collection. Post whipple w/ no evidence pancreatic ductal dilatation or peripancreatic fluid collection, small amount of free fluid in pelvis GI Surgeries / Procedures:  12/3 diagnostic laparoscopy, Whipple procedure, pancreatic duct stent 12/13 subhepatic fluid collection aspiration and drainage  Central access: PICC placed 12/01/23 TPN start date: 12/02/23  Nutritional Goals: Clinimix E 8/10 1L at 42 ml/hr on Sun/Tues/Wed/Thurs/Fri/Sat Clinimix E 8/10 2L at 83 ml/hr on Mon SMOF lipids 250 ml daily  Provides 100% AA goal and ~95% of kCal  goal  Electrolytes in Clinimix E 1L bag: Na , K , Mag , Ca 4.67mEq, Phos , Ac , Cl - on national back order Electrolytes in Clnimix E 8/10 2L bag: Na 70 mEq, K 60 mEq, Mag 10 mEq, Ca 9 mEq, Phos 30 mmol, Acetate 166 , Cl 152 mEq   RD Estimated Needs Total Energy Estimated Needs: 1400-1700 kcal/d Total Protein Estimated Needs: 65-80g/d Total Fluid Estimated Needs: 1.5-1.7L/d  Current Nutrition:  TPN FLD - minimal intake  Ensure Enlive TID - none charted given yesterday  Plan:  Clinimix E 8/10 1L at 42 ml/hr on Sun/Tues/Wed/Thurs/Fri/Sat Clinimix E 8/10 2L at 83 ml/hr on Mon only SMOFlipid daily  Provides a weekly average of 1257 kCal and 92g AA per day, meeting 90% of minimal kCal and 115% of protein needs Add MVI and trace elements to TPN Monitor TPN labs on Mon/Thurs F/U ability to advance diet   Alexanderia Gorby BS, PharmD, BCPS Clinical Pharmacist 12/17/2023 7:09 AM  Contact: [phone] after 3 PM  "Be curious, not judgmental..." -Debbora Dus

## 2023-12-18 LAB — BASIC METABOLIC PANEL
Anion gap: 9 (ref 5–15)
BUN: 24 mg/dL — ABNORMAL HIGH (ref 8–23)
CO2: 16 mmol/L — ABNORMAL LOW (ref 22–32)
Calcium: 8.9 mg/dL (ref 8.9–10.3)
Chloride: 106 mmol/L (ref 98–111)
Creatinine, Ser: 0.45 mg/dL (ref 0.44–1.00)
GFR, Estimated: >60 mL/min (ref >60)
Glucose, Bld: 120 mg/dL — ABNORMAL HIGH (ref 70–99)
Potassium: 4.1 mmol/L (ref 3.5–5.1)
Sodium: 131 mmol/L — ABNORMAL LOW (ref 135–145)

## 2023-12-18 LAB — BPAM RBC
Blood Product Expiration Date: 202501222359
ISSUE DATE / TIME: 202412271627
Unit Type and Rh: 5100

## 2023-12-18 LAB — CBC
HCT: 28.1 % — ABNORMAL LOW (ref 36.0–46.0)
Hemoglobin: 9.1 g/dL — ABNORMAL LOW (ref 12.0–15.0)
MCH: 29.6 pg (ref 26.0–34.0)
MCHC: 32.4 g/dL (ref 30.0–36.0)
MCV: 91.5 fL (ref 80.0–100.0)
Platelets: 403 10*3/uL — ABNORMAL HIGH (ref 150–400)
RBC: 3.07 MIL/uL — ABNORMAL LOW (ref 3.87–5.11)
RDW: 17.6 % — ABNORMAL HIGH (ref 11.5–15.5)
WBC: 14.2 10*3/uL — ABNORMAL HIGH (ref 4.0–10.5)
nRBC: 0 % (ref 0.0–0.2)

## 2023-12-18 LAB — TYPE AND SCREEN
ABO/RH(D): O POS
Antibody Screen: NEGATIVE
Unit division: 0

## 2023-12-18 MED ORDER — FAT EMUL FISH OIL/PLANT BASED 20% (SMOFLIPID)IV EMUL
250.0000 mL | INTRAVENOUS | Status: AC
  Administered 2023-12-18: 250 mL via INTRAVENOUS
  Filled 2023-12-18: qty 250

## 2023-12-18 MED ORDER — TRACE MINERALS CU-MN-SE-ZN 300-55-60-3000 MCG/ML IV SOLN
INTRAVENOUS | Status: AC
  Filled 2023-12-18 (×2): qty 1000

## 2023-12-18 NOTE — Progress Notes (Signed)
Per PA order, 1 staple from abdominal incision removed by RN with no complications. Pt tolerated removal well, no bleeding noted during removal, and no s/s of infection.

## 2023-12-18 NOTE — Progress Notes (Signed)
PHARMACY - TOTAL PARENTERAL NUTRITION CONSULT NOTE  Indication:  delayed gastric emptying  Patient Measurements: Height: 4\' 10"  (147.3 cm) Weight: 57.2 kg (126 lb) IBW/kg (Calculated) : 40.9 TPN AdjBW (KG): 44.5 Body mass index is 26.33 kg/m. 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  Patient was started on a CLD on 12/6-12/9 and then advanced to a soft diet since 12/10.  Patient has had minimal PO intake since being post-op and continues to experience nausea. Delayed gastric emptying likely related to possible pancreatic leak.  Pharmacy consulted to dose TPN.  Glucose / Insulin: A1c 3.7 (11/15/23) - glucoses < 180. SSI D/C'ed 12/08/23. Electrolytes: No BMET today Renal: SCr < 1, BUN WNL Hepatic: LFTs / tbili WNL, albumin 2.4 Intake / Output; MIVF: UOP not charted, NG reinserted 12/24 - 1200 mL, drain 240 mL, LBM 12/25 (s/p Reglan x3 doses 12/11-12/12, restarted 12/15) GI Imaging: 12/07 Ab XR: unremarkable bowel gas pattern. 12/11 CT: possible post-op seroma or hematoma vs abscess, distal colonic diverticulosis 12/15 Ab XR: expected post-op changes, NGT in correct place 12/26- CT ABD- perc drain cath adj left lobe liver- no discernable residual fluid collection. Post whipple w/ no evidence pancreatic ductal dilatation or peripancreatic fluid collection, small amount of free fluid in pelvis GI Surgeries / Procedures:  12/3 diagnostic laparoscopy, Whipple procedure, pancreatic duct stent 12/13 subhepatic fluid collection aspiration and drainage  Central access: PICC placed 12/01/23 TPN start date: 12/02/23  Nutritional Goals: Clinimix E 8/10 1L at 42 ml/hr on Sun/Tues/Wed/Thurs/Fri/Sat Clinimix E 8/10 2L at 83 ml/hr on Mon SMOF lipids 250 ml daily  Provides 100% AA goal and ~95% of kCal goal  Electrolytes in Clinimix E 1L bag: Na  , K , Mag , Ca 4.43mEq, Phos , Ac , Cl - on national back order Electrolytes in Clnimix E 8/10 2L bag: Na 70 mEq, K 60 mEq, Mag 10 mEq, Ca 9 mEq, Phos 30 mmol, Acetate 166 , Cl 152 mEq   RD Estimated Needs Total Energy Estimated Needs: 1400-1700 kcal/d Total Protein Estimated Needs: 65-80g/d Total Fluid Estimated Needs: 1.5-1.7L/d  Current Nutrition:  TPN FLD - minimal intake - sips for comfort Ensure Enlive TID - none charted given yesterday  Plan:  Clinimix E 8/10 1L at 42 ml/hr on Sun/Tues/Wed/Thurs/Fri/Sat Clinimix E 8/10 2L at 83 ml/hr on Mon only SMOFlipid daily  Provides a weekly average of 1257 kCal and 92g AA per day, meeting 90% of minimal kCal and 115% of protein needs Add MVI and trace elements to TPN Monitor TPN labs on Mon/Thurs  Christoper Fabian, PharmD, BCPS Please see amion for complete clinical pharmacist phone list 12/18/2023 7:31 AM

## 2023-12-18 NOTE — Progress Notes (Signed)
25 Days Post-Op   Subjective/Chief Complaint: CC Still tired feeling. Denies any pain or nausea. NGT w/ 1500cc/24 hours. BM reported yesterday.   Afebrile. No tachycardia or hypotension. On RA. Received 1 U PRBC yesterday w/ Hgb 7.4 > 10 post transfusion. Labs pending today.   Objective: Vital signs in last 24 hours: Temp:  [97.6 F (36.4 C)-98.6 F (37 C)] 98.2 F (36.8 C) (12/28 0955) Pulse Rate:  [72-98] 84 (12/28 0955) Resp:  [15-26] 21 (12/28 0955) BP: (131-167)/(51-67) 136/58 (12/28 0955) SpO2:  [96 %-99 %] 98 % (12/28 0955) Weight:  [57.2 kg] 57.2 kg (12/28 0500) Last BM Date : 12/15/23  Intake/Output from previous day: 12/27 0701 - 12/28 0700 In: 1202.1 [I.V.:802.1; IV Piggyback:400] Out: 1710 [Emesis/NG output:1500; Drains:210] Intake/Output this shift: Total I/O In: 10 [I.V.:10] Out: 55 [Drains:55]  Physical Exam:  General appearance: sitting up in chair. alert, cooperative. MAE's Resp: breathing comfortably GI: soft, non distended. Minimal generalized ttp. No rigidity or guarding. +BS. Single staple still in place, cdi without erythema or drainage. NGT with dark red fluid              D1 - LLQ - 105cc (from 70cc yesterday) - cloudy, brown appearing fluid             D2 RLQ - 80cc (from 80cc yesterday ) - cloudy, brown, appearing fluid             D3 R abd IR drain - 25cc (from 40cc yesterday) - serous Extremities: No LE edema  Lab Results:  Recent Labs    12/17/23 0621 12/17/23 2040  WBC 10.3 11.4*  HGB 7.4* 10.0*  HCT 22.6* 31.9*  PLT 347 408*    BMET Recent Labs    12/16/23 0448  NA 132*  K 4.3  CL 105  CO2 20*  GLUCOSE 146*  BUN 23  CREATININE 0.38*  CALCIUM 8.9   PT/INR No results for input(s): "LABPROT", "INR" in the last 72 hours.   ABG No results for input(s): "PHART", "HCO3" in the last 72 hours.  Invalid input(s): "PCO2", "PO2"   Studies/Results: No results found.     Anti-infectives: Anti-infectives (From admission,  onward)    Start     Dose/Rate Route Frequency Ordered Stop   12/13/23 1700  piperacillin-tazobactam (ZOSYN) IVPB 3.375 g        3.375 g 12.5 mL/hr over 240 Minutes Intravenous Every 8 hours 12/13/23 1412 12/13/23 2139   11/30/23 1230  fluconazole (DIFLUCAN) IVPB 400 mg        400 mg 100 mL/hr over 120 Minutes Intravenous Every 24 hours 11/30/23 1135     11/26/23 0945  piperacillin-tazobactam (ZOSYN) IVPB 3.375 g  Status:  Discontinued        3.375 g 12.5 mL/hr over 240 Minutes Intravenous Every 8 hours 11/26/23 0853 12/13/23 1412   11/23/23 2100  ceFAZolin (ANCEF) IVPB 2g/100 mL premix        2 g 200 mL/hr over 30 Minutes Intravenous Every 8 hours 11/23/23 1654 11/23/23 2214   11/23/23 0700  ceFAZolin (ANCEF) IVPB 2g/100 mL premix        2 g 200 mL/hr over 30 Minutes Intravenous On call to O.R. 11/23/23 0654 11/23/23 1249       Assessment/Plan: Ms. Slater is an 87 year old female status post Whipple Procedure on 11/23/2023, (POD 25).  Final pathology - adenocarcinoma duodenum, positive margin, positive LN - [id number]   Abdominal fluid collection - CT aspiration  and drain placement 12/13 (anterior hepatic fluid collection) - serosang. Rare candida seen on final culture. Completed Zosyn. On fluconazole. Resolved on CT. Pancreatic leak - Cont surgical and IR drains. WBCs now normal.  Drain output stable Delayed gastric emptying. EKG for Qtc pending from yesterday.  Continue reglan. NGT and NPO. Ok to have sips of clears for comfort. TPN.  HOCM - continue beta blockade and treat hypertension. IV metoprolol to q4h instead of q6 per Cardiology.   Anemia of chronic disease, chronic blood loss anemia, and acute blood loss anemia- S/p 1U PRBC 12/27. Hgb 7.4 > 10 post transfusion. Labs pending today.  Hyperglycemia - improving, monitor   Hypophosphatemia - resolved Hypokalemia - resolved. Pharmacy monitoring.  Severe protein calorie malnutrition - TNA. NGT and NPO RUE DVT - treatment dose  lovenox, reviewed with vascular and will continue PICC for now OOB, IS, PT consult.   FEN - TPN VTE - lovenox treatment dose. Keep picc.   ID - Fluconazole   LOS: 25 days   Jacinto Halim, [facility] 12/18/2023, 1:42 PM Please see Amion for pager number during day hours 7:00am-4:30pm

## 2023-12-18 NOTE — Progress Notes (Signed)
EKG completed per order and placed in pt chart.

## 2023-12-19 LAB — CBC
HCT: 31 % — ABNORMAL LOW (ref 36.0–46.0)
Hemoglobin: 9.8 g/dL — ABNORMAL LOW (ref 12.0–15.0)
MCH: 29.8 pg (ref 26.0–34.0)
MCHC: 31.6 g/dL (ref 30.0–36.0)
MCV: 94.2 fL (ref 80.0–100.0)
Platelets: 417 10*3/uL — ABNORMAL HIGH (ref 150–400)
RBC: 3.29 MIL/uL — ABNORMAL LOW (ref 3.87–5.11)
RDW: 17.6 % — ABNORMAL HIGH (ref 11.5–15.5)
WBC: 9.3 10*3/uL (ref 4.0–10.5)
nRBC: 0 % (ref 0.0–0.2)

## 2023-12-19 MED ORDER — SODIUM CHLORIDE 0.9 % IV BOLUS
500.0000 mL | Freq: Once | INTRAVENOUS | Status: AC
  Administered 2023-12-19: 500 mL via INTRAVENOUS

## 2023-12-19 MED ORDER — TRACE MINERALS CU-MN-SE-ZN 300-55-60-3000 MCG/ML IV SOLN
INTRAVENOUS | Status: AC
  Filled 2023-12-19 (×2): qty 1000

## 2023-12-19 MED ORDER — FAT EMUL FISH OIL/PLANT BASED 20% (SMOFLIPID)IV EMUL
250.0000 mL | INTRAVENOUS | Status: AC
  Administered 2023-12-19: 250 mL via INTRAVENOUS
  Filled 2023-12-19: qty 250

## 2023-12-19 NOTE — Progress Notes (Signed)
26 Days Post-Op   Subjective/Chief Complaint: CC Reports her granddaughter graduated today.   Denies any abdominal distension, abdominal pain or nausea. NGT w/ 1100cc/24 hours. BM reported yesterday.   Afebrile. Tachycardia resolved. No hypotension. On RA. Labs pending today.   Objective: Vital signs in last 24 hours: Temp:  [97.7 F (36.5 C)-98.1 F (36.7 C)] 97.7 F (36.5 C) (12/29 0903) Pulse Rate:  [70-111] 70 (12/29 0903) Resp:  [20-24] 20 (12/29 0903) BP: (121-146)/(46-63) 138/60 (12/29 0903) SpO2:  [88 %-100 %] 96 % (12/29 0903) Last BM Date : 12/18/23  Intake/Output from previous day: 12/28 0701 - 12/29 0700 In: 940.6 [I.V.:740.6; IV Piggyback:200] Out: 1310 [Emesis/NG output:1100; Drains:210] Intake/Output this shift: No intake/output data recorded.  Physical Exam:  General appearance: sitting up in chair. alert, cooperative. MAE's Resp: breathing comfortably GI: soft, non distended. Minimal generalized ttp. No rigidity or guarding. +BS. Single staple still in place, cdi without erythema or drainage. NGT with dark red fluid              D1 - LLQ - 75cc (from  105cc yesterday) - cloudy, brown appearing fluid             D2 RLQ - 125cc (from 80cc yesterday ) - cloudy, brown, appearing fluid - appears to have retracted some but suture is still in place at skin, discussed with RN to ensure secured with tape and gravity bag safety pinned to gown to ensure it does not get pulled.              D3 R abd IR drain - 10cc (from  25cc yesterday) - serous Extremities: No LE edema  Lab Results:  Recent Labs    12/17/23 2040 12/18/23 1400  WBC 11.4* 14.2*  HGB 10.0* 9.1*  HCT 31.9* 28.1*  PLT 408* 403*    BMET Recent Labs    12/18/23 1400  NA 131*  K 4.1  CL 106  CO2 16*  GLUCOSE 120*  BUN 24*  CREATININE 0.45  CALCIUM 8.9   PT/INR No results for input(s): "LABPROT", "INR" in the last 72 hours.   ABG No results for input(s): "PHART", "HCO3" in the last 72  hours.  Invalid input(s): "PCO2", "PO2"   Studies/Results: No results found.     Anti-infectives: Anti-infectives (From admission, onward)    Start     Dose/Rate Route Frequency Ordered Stop   12/13/23 1700  piperacillin-tazobactam (ZOSYN) IVPB 3.375 g        3.375 g 12.5 mL/hr over 240 Minutes Intravenous Every 8 hours 12/13/23 1412 12/13/23 2139   11/30/23 1230  fluconazole (DIFLUCAN) IVPB 400 mg        400 mg 100 mL/hr over 120 Minutes Intravenous Every 24 hours 11/30/23 1135     11/26/23 0945  piperacillin-tazobactam (ZOSYN) IVPB 3.375 g  Status:  Discontinued        3.375 g 12.5 mL/hr over 240 Minutes Intravenous Every 8 hours 11/26/23 0853 12/13/23 1412   11/23/23 2100  ceFAZolin (ANCEF) IVPB 2g/100 mL premix        2 g 200 mL/hr over 30 Minutes Intravenous Every 8 hours 11/23/23 1654 11/23/23 2214   11/23/23 0700  ceFAZolin (ANCEF) IVPB 2g/100 mL premix        2 g 200 mL/hr over 30 Minutes Intravenous On call to O.R. 11/23/23 0654 11/23/23 1249       Assessment/Plan: Ms. Krummel is an 87 year old female status post Whipple Procedure on 11/23/2023, (  POD 26).  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 Rare candida seen on final culture. Completed Zosyn. On fluconazole. Resolved on CT. Pancreatic leak - Cont surgical and IR drains. Drain output stable Delayed gastric emptying. Qtc ok.  Continue reglan. NGT and NPO. Ok to have sips of clears for comfort. TPN. Consider clamping trial tomorrow.  HOCM - continue beta blockade and treat hypertension. IV metoprolol to q4h instead of q6 per Cardiology.   Anemia of chronic disease, chronic blood loss anemia, and acute blood loss anemia- S/p 1U PRBC 12/27. Hgb 9.1 from 10 yesterday. Labs pending today.  Hyperglycemia - improving, monitor   Hypophosphatemia -  resolved Hypokalemia - resolved. Pharmacy monitoring.  Severe protein calorie malnutrition - TNA. NGT and NPO RUE DVT - treatment dose lovenox, reviewed with vascular and will continue PICC for now OOB, IS, PT consult.   FEN - TPN VTE - lovenox treatment dose. Keep picc.   ID - Fluconazole   LOS: 26 days   Jacinto Halim, [facility] 12/19/2023, 10:27 AM Please see Amion for pager number during day hours 7:00am-4:30pm

## 2023-12-19 NOTE — Progress Notes (Signed)
PHARMACY - TOTAL PARENTERAL NUTRITION CONSULT NOTE  Indication:  delayed gastric emptying  Patient Measurements: Height: 4\' 10"  (147.3 cm) Weight: 57.2 kg (126 lb) IBW/kg (Calculated) : 40.9 TPN AdjBW (KG): 44.5 Body mass index is 26.33 kg/m. 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  Patient was started on a CLD on 12/6-12/9 and then advanced to a soft diet since 12/10.  Patient has had minimal PO intake since being post-op and continues to experience nausea. Delayed gastric emptying likely related to possible pancreatic leak.  Pharmacy consulted to dose TPN.  Glucose / Insulin: A1c 3.7 (11/15/23) - glucoses < 180. SSI D/C'ed 12/08/23. Electrolytes: No BMET today Renal: SCr < 1, BUN WNL Hepatic: LFTs / tbili WNL, albumin 2.4 Intake / Output; MIVF: UOP not charted (6+ unmeasured), NG reinserted 12/24 - 1100 mL, drain 210 mL, LBM 12/28 (s/p Reglan x3 doses 12/11-12/12, restarted 12/15) GI Imaging: 12/07 Ab XR: unremarkable bowel gas pattern. 12/11 CT: possible post-op seroma or hematoma vs abscess, distal colonic diverticulosis 12/15 Ab XR: expected post-op changes, NGT in correct place 12/26- CT ABD- perc drain cath adj left lobe liver- no discernable residual fluid collection. Post whipple w/ no evidence pancreatic ductal dilatation or peripancreatic fluid collection, small amount of free fluid in pelvis GI Surgeries / Procedures:  12/3 diagnostic laparoscopy, Whipple procedure, pancreatic duct stent 12/13 subhepatic fluid collection aspiration and drainage  Central access: PICC placed 12/01/23 TPN start date: 12/02/23  Nutritional Goals: Clinimix E 8/10 1L at 42 ml/hr on Sun/Tues/Wed/Thurs/Fri/Sat Clinimix E 8/10 2L at 83 ml/hr on Mon SMOF lipids 250 ml daily  Provides 100% AA goal and ~95% of kCal goal  Electrolytes in Clinimix  E 1L bag: Na , K , Mag , Ca 4.48mEq, Phos , Ac , Cl - on national back order Electrolytes in Clnimix E 8/10 2L bag: Na 70 mEq, K 60 mEq, Mag 10 mEq, Ca 9 mEq, Phos 30 mmol, Acetate 166 , Cl 152 mEq   RD Estimated Needs Total Energy Estimated Needs: 1400-1700 kcal/d Total Protein Estimated Needs: 65-80g/d Total Fluid Estimated Needs: 1.5-1.7L/d  Current Nutrition:  TPN FLD - minimal intake - sips for comfort Ensure Enlive TID - none charted given yesterday  Plan:  Clinimix E 8/10 1L at 42 ml/hr on Sun/Tues/Wed/Thurs/Fri/Sat Clinimix E 8/10 2L at 83 ml/hr on Mon only SMOFlipid daily  Provides a weekly average of 1257 kCal and 92g AA per day, meeting 90% of minimal kCal and 115% of protein needs Add MVI and trace elements to TPN Monitor TPN labs on Mon/Thurs   Dekota Shenk BS, PharmD, BCPS Clinical Pharmacist 12/19/2023 7:15 AM  Contact: [phone] after 3 PM  "Be curious, not judgmental..." -Debbora Dus

## 2023-12-20 LAB — TRIGLYCERIDES: Triglycerides: 103 mg/dL (ref <150)

## 2023-12-20 LAB — COMPREHENSIVE METABOLIC PANEL
ALT: 20 U/L (ref 0–44)
AST: 17 U/L (ref 15–41)
Albumin: 2.5 g/dL — ABNORMAL LOW (ref 3.5–5.0)
Alkaline Phosphatase: 105 U/L (ref 38–126)
Anion gap: 8 (ref 5–15)
BUN: 20 mg/dL (ref 8–23)
CO2: 19 mmol/L — ABNORMAL LOW (ref 22–32)
Calcium: 8.8 mg/dL — ABNORMAL LOW (ref 8.9–10.3)
Chloride: 107 mmol/L (ref 98–111)
Creatinine, Ser: 0.4 mg/dL — ABNORMAL LOW (ref 0.44–1.00)
GFR, Estimated: >60 mL/min (ref >60)
Glucose, Bld: 106 mg/dL — ABNORMAL HIGH (ref 70–99)
Potassium: 3.7 mmol/L (ref 3.5–5.1)
Sodium: 134 mmol/L — ABNORMAL LOW (ref 135–145)
Total Bilirubin: 0.4 mg/dL (ref <1.2)
Total Protein: 5.9 g/dL — ABNORMAL LOW (ref 6.5–8.1)

## 2023-12-20 LAB — CBC
HCT: 29.2 % — ABNORMAL LOW (ref 36.0–46.0)
Hemoglobin: 9.3 g/dL — ABNORMAL LOW (ref 12.0–15.0)
MCH: 30 pg (ref 26.0–34.0)
MCHC: 31.8 g/dL (ref 30.0–36.0)
MCV: 94.2 fL (ref 80.0–100.0)
Platelets: 416 10*3/uL — ABNORMAL HIGH (ref 150–400)
RBC: 3.1 MIL/uL — ABNORMAL LOW (ref 3.87–5.11)
RDW: 17.1 % — ABNORMAL HIGH (ref 11.5–15.5)
WBC: 8.4 10*3/uL (ref 4.0–10.5)
nRBC: 0 % (ref 0.0–0.2)

## 2023-12-20 LAB — MAGNESIUM: Magnesium: 1.8 mg/dL (ref 1.7–2.4)

## 2023-12-20 LAB — PHOSPHORUS: Phosphorus: 2.7 mg/dL (ref 2.5–4.6)

## 2023-12-20 MED ORDER — TRACE MINERALS CU-MN-SE-ZN 300-55-60-3000 MCG/ML IV SOLN
INTRAVENOUS | Status: AC
  Filled 2023-12-20: qty 2000

## 2023-12-20 MED ORDER — FAT EMUL FISH OIL/PLANT BASED 20% (SMOFLIPID)IV EMUL
250.0000 mL | INTRAVENOUS | Status: AC
  Administered 2023-12-20: 250 mL via INTRAVENOUS
  Filled 2023-12-20: qty 250

## 2023-12-20 NOTE — Progress Notes (Signed)
Supervising Physician: Dr. Fredia Sorrow  Patient Status:  [facility] - In-pt  Chief Complaint:  Duodenal adenocarcinoma s/p Whipple procedure with post-op subcapsular fluid collection around the anterior aspect of the left liver lobe.   Brief History:  87 year old female status post Whipple Procedure on 11/23/2023  Found to have perihepatic fluid collection S/p 10 Fr drain placement by Dr. Elby Showers on 12/13  Subjective:  Looks good today No new complaints  Allergies: Codeine  Medications:  Current Facility-Administered Medications:    .TPN (CLINIMIX-E) Adult, , Intravenous, Continuous TPN, Last Rate: 42 mL/hr at 12/19/23 1746, New Bag at 12/19/23 1746 **AND** [EXPIRED] fat emulsion 20 % (SMOFLIPID) infusion, 250 mL, Intravenous, Continuous TPN, Reome, Earle J, RPH, Stopped at 12/20/23 0551   acetaminophen (TYLENOL) tablet 650 mg, 650 mg, Oral, Q6H PRN, Almond Lint, MD, 650 mg at 12/01/23 0902   Chlorhexidine Gluconate Cloth 2 % PADS 6 each, 6 each, Topical, Q0600, Almond Lint, MD, 6 each at 12/20/23 1000   enoxaparin (LOVENOX) injection 60 mg, 60 mg, Subcutaneous, Q12H, Calton Dach I, RPH, 60 mg at 12/19/23 1720   TPN (CLINIMIX-E) Adult, , Intravenous, Continuous TPN **AND** fat emulsion 20 % (SMOFLIPID) infusion, 250 mL, Intravenous, Continuous TPN, Francena Hanly, RPH   fentaNYL (SUBLIMAZE) injection 25 mcg, 25 mcg, Intravenous, Q1H PRN, Almond Lint, MD, 25 mcg at 12/16/23 1411   fluconazole (DIFLUCAN) IVPB 400 mg, 400 mg, Intravenous, Q24H, Almond Lint, MD, Last Rate: 100 mL/hr at 12/20/23 1158, 400 mg at 12/20/23 1158   guaiFENesin-dextromethorphan (ROBITUSSIN DM) 100-10 MG/5ML syrup 15 mL, 15 mL, Oral, Q4H PRN, Almond Lint, MD, 15 mL at 12/04/23 2029   haloperidol lactate (HALDOL) injection 2 mg, 2 mg, Intravenous, Q8H PRN, Almond Lint, MD, 2 mg at 11/26/23 2241   ipratropium-albuterol (DUONEB) 0.5-2.5 (3) MG/3ML nebulizer solution 3 mL, 3 mL, Nebulization, Q6H  PRN, Almond Lint, MD   methocarbamol (ROBAXIN) injection 500 mg, 500 mg, Intravenous, Q8H PRN, Almond Lint, MD, 500 mg at 12/15/23 2221   metoCLOPramide (REGLAN) injection 5 mg, 5 mg, Intravenous, Q8H, White, Stephanie Coup, MD, 5 mg at 12/20/23 1329   metoprolol tartrate (LOPRESSOR) injection 7.5 mg, 7.5 mg, Intravenous, 6 X Daily, Moise Boring, MD, 7.5 mg at 12/20/23 1329   ondansetron (ZOFRAN) injection 4 mg, 4 mg, Intravenous, Q6H PRN, Almond Lint, MD, 4 mg at 12/15/23 2221   Oral care mouth rinse, 15 mL, Mouth Rinse, PRN, Almond Lint, MD   pantoprazole (PROTONIX) injection 40 mg, 40 mg, Intravenous, Q12H, Moise Boring, MD, 40 mg at 12/20/23 1019   prochlorperazine (COMPAZINE) tablet 10 mg, 10 mg, Oral, Q6H PRN **OR** prochlorperazine (COMPAZINE) injection 5-10 mg, 5-10 mg, Intravenous, Q6H PRN, Almond Lint, MD, 10 mg at 12/16/23 0029   sodium chloride flush (NS) 0.9 % injection 10-40 mL, 10-40 mL, Intracatheter, Q12H, Almond Lint, MD, 10 mL at 12/20/23 1019   sodium chloride flush (NS) 0.9 % injection 10-40 mL, 10-40 mL, Intracatheter, PRN, Almond Lint, MD   sodium chloride flush (NS) 0.9 % injection 5 mL, 5 mL, Intracatheter, Q8H, Suttle, Dylan J, MD, 5 mL at 12/20/23 1334   sucralfate (CARAFATE) 1 GM/10ML suspension 1 g, 1 g, Per Tube, TID, Maczis, Elmer Sow, PA-C, 1 g at 12/20/23 1019    Vital Signs: BP (!) 126/49 (BP Location: Left Arm)   Pulse 73   Temp 98 F (36.7 C) (Oral)   Resp (!) 22   Ht 4\' 10"  (1.473 m)  Wt 114 lb 10.2 oz (52 kg)   SpO2 99%   BMI 23.96 kg/m   Physical Exam Constitutional:      General: She is not in acute distress. Pulmonary:     Effort: Pulmonary effort is normal.  Abdominal:     Tenderness: There is abdominal tenderness.     Comments: IR abdominal drain to suction. Suture and dressing in place. Site nontender. Minimal output in drain. Drain flushes well.  There are two other surgical drains to gravity.    Skin:     General: Skin is warm and dry.  Neurological:     Mental Status: She is alert and oriented to person, place, and time.      Labs:  CBC: Recent Labs    12/17/23 2040 12/18/23 1400 12/19/23 1057 12/20/23 0441  WBC 11.4* 14.2* 9.3 8.4  HGB 10.0* 9.1* 9.8* 9.3*  HCT 31.9* 28.1* 31.0* 29.2*  PLT 408* 403* 417* 416*    COAGS: Recent Labs    11/15/23 1115 11/24/23 0405 11/25/23 0510 12/03/23 0544  INR 1.0 1.4* 1.3* 1.2    BMP: Recent Labs    12/15/23 0712 12/16/23 0448 12/18/23 1400 12/20/23 0441  NA 133* 132* 131* 134*  K 3.5 4.3 4.1 3.7  CL 105 105 106 107  CO2 22 20* 16* 19*  GLUCOSE 113* 146* 120* 106*  BUN 12 23 24* 20  CALCIUM 8.5* 8.9 8.9 8.8*  CREATININE 0.41* 0.38* 0.45 0.40*  GFRNONAA >60 >60 >60 >60    LIVER FUNCTION TESTS: Recent Labs    12/09/23 0938 12/13/23 0551 12/16/23 0448 12/20/23 0441  BILITOT 0.5 0.4 0.3 0.4  AST 18 20 25 17   ALT 19 19 27 20   ALKPHOS 93 77 74 105  PROT 5.7* 4.9* 5.3* 5.9*  ALBUMIN 2.4* 2.2* 2.4* 2.5*    Assessment and Plan:  Duodenal adenocarcinoma s/p Whipple procedure with post-op subcapsular fluid collection around the anterior aspect of the left liver lobe. Drain placement in IR 12/03/23   Down trending WBC, she is afebrile. Cultures grew candida albicans.   Drain Location: Upper mid abdomen Size: Fr size: 10 Fr Date of placement: 12/03/23  Currently to: Drain collection device: gravity 24 hour output:  Output by Drain (mL) 12/18/23 0701 - 12/18/23 1900 12/18/23 1901 - 12/19/23 0700 12/19/23 0701 - 12/19/23 1900 12/19/23 1901 - 12/20/23 0700 12/20/23 0701 - 12/20/23 1353  Closed System Drain 1 Right RLQ Other (Comment) 19 Fr. 65 60 100 50   Closed System Drain 1 Left LLQ Other (Comment) 19 Fr. 50 25 10 0   Closed System Drain Right Abdomen Bulb (JP) 10 Fr. 5 5 0 10     Interval imaging/drain manipulation:  none  Current examination: Flushes/aspirates easily.  Insertion site unremarkable. Suture  and stat lock in place. Dressed appropriately.   Plan: With sudden increase output on 12/25, will keep the drain in place for now. Anticipating removal early next week if output remains low over the weekend.   Continue TID flushes with 5 cc NS. Record output Q shift. Dressing changes QD or PRN if soiled.  Call IR APP or on call IR MD if difficulty flushing or sudden change in drain output.  Repeat imaging/possible drain injection once output < 10 mL/QD (excluding flush material). Consideration for drain removal if output is < 10 mL/QD (excluding flush material), pending discussion with the providing surgical service.  Discharge planning: Please contact IR APP or on call IR MD prior to patient  d/c to ensure appropriate follow up plans are in place. Typically patient will follow up with IR clinic 10-14 days post d/c for repeat imaging/possible drain injection. IR scheduler will contact patient with date/time of appointment. Patient will need to flush drain QD with 5 cc NS, record output QD, dressing changes every 2-3 days or earlier if soiled.   IR will continue to follow - please call with questions or concerns.     Electronically Signed: Brayton El, PA-C 12/20/2023, 1:53 PM   I spent a total of 15 Minutes at the the patient's bedside AND on the patient's hospital floor or unit, greater than 50% of which was counseling/coordinating care for perihepatic drain f/u.

## 2023-12-20 NOTE — Progress Notes (Signed)
PHARMACY - TOTAL PARENTERAL NUTRITION CONSULT NOTE  Indication:  delayed gastric emptying  Patient Measurements: Height: 4\' 10"  (147.3 cm) Weight: 52 kg (114 lb 10.2 oz) IBW/kg (Calculated) : 40.9 TPN AdjBW (KG): 44.5 Body mass index is 23.96 kg/m. 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  Patient was started on a CLD on 12/6-12/9 and then advanced to a soft diet since 12/10.  Patient has had minimal PO intake since being post-op and continues to experience nausea. Delayed gastric emptying likely related to possible pancreatic leak.  Pharmacy consulted to dose TPN.  Glucose / Insulin: A1c 3.7 (11/15/23) - glucoses < 180. SSI D/C'ed 12/08/23. Electrolytes: Na: 134, K: 3.7, HCO3: 19, CoCa: 10.0, Mag: 1.8 Renal: SCr < 1, BUN WNL Hepatic: LFTs / tbili WNL, albumin 2.5 Intake / Output; MIVF: UOP not charted (7x unmeasured), NG reinserted 12/24 - 900 mL, drain 170 mL, LBM 12/28 (s/p Reglan x3 doses 12/11-12/12, restarted 12/15) GI Imaging: 12/07 Ab XR: unremarkable bowel gas pattern. 12/11 CT: possible post-op seroma or hematoma vs abscess, distal colonic diverticulosis 12/15 Ab XR: expected post-op changes, NGT in correct place 12/26- CT ABD- perc drain cath adj left lobe liver- no discernable residual fluid collection. Post whipple w/ no evidence pancreatic ductal dilatation or peripancreatic fluid collection, small amount of free fluid in pelvis GI Surgeries / Procedures:  12/3 diagnostic laparoscopy, Whipple procedure, pancreatic duct stent 12/13 subhepatic fluid collection aspiration and drainage  Central access: PICC placed 12/01/23 TPN start date: 12/02/23  Nutritional Goals: Clinimix E 8/10 1L at 42 ml/hr on Sun/Tues/Wed/Thurs/Fri/Sat Clinimix E 8/10 2L at 83 ml/hr on Mon SMOF lipids 250 ml daily  Provides 100% AA goal and ~95%  of kCal goal  Electrolytes in Clinimix E 1L bag: Na , K , Mag , Ca 4.31mEq, Phos , Ac , Cl - on national back order Electrolytes in Clnimix E 8/10 2L bag: Na 70 mEq, K 60 mEq, Mag 10 mEq, Ca 9 mEq, Phos 30 mmol, Acetate 166 , Cl 152 mEq   RD Estimated Needs Total Energy Estimated Needs: 1400-1700 kcal/d Total Protein Estimated Needs: 65-80g/d Total Fluid Estimated Needs: 1.5-1.7L/d  Current Nutrition:  TPN NPO   Plan:  Clinimix E 8/10 1L at 42 ml/hr on Sun/Tues/Wed/Thurs/Fri/Sat Clinimix E 8/10 2L at 83 ml/hr on Mon only SMOFlipid daily  Provides a weekly average of 1257 kCal and 92g AA per day, meeting 90% of minimal kCal and 115% of protein needs Add MVI and trace elements to TPN Monitor TPN labs on Mon/Thurs Ability to advance diet as able.   Estill Batten, PharmD, BCCCP  Clinical Pharmacist 12/20/2023 7:12 AM  Contact: [phone] after 3 PM

## 2023-12-20 NOTE — Progress Notes (Signed)
Physical Therapy Treatment Patient Details Name: Christine Morse MRN: [id number] DOB: [birth year] Today's Date: 12/20/2023   History of Present Illness Patient is an 87 y/o female admitted 11/23/23 with diagnosis of duodenal adenoma and underwent laparoscopic Whipple procedure. post-op subcapsular fluid collection around the anterior aspect of the left liver lobe. Drain placement in IR 12/03/23. NGT inserted 12/15, dislodged then replaced.  Positive for R subclavian and axillary DVT on 12/07/23. 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    PT Comments  Pt progressing slowly, but steadily.  Pt a little confused this afternoon about location, where her dtr might be, but spent time reorienting to positive effect.  Emphasis on safety with scooting, hand placement with STS's and descent and progression of gait stability/stamina.     If plan is discharge home, recommend the following: A little help with walking and/or transfers;Assistance with cooking/housework;Assist for transportation;Help with stairs or ramp for entrance;A little help with bathing/dressing/bathroom   Can travel by private vehicle     Yes  Equipment Recommendations  Rolling walker (2 wheels);BSC/3in1    Recommendations for Other Services       Precautions / Restrictions Precautions Precautions: Fall Precaution Comments: x2 blake drains, x1 JP drain     Mobility  Bed Mobility               General bed mobility comments: OOB in the recliner on arrival and back to the chair.    Transfers Overall transfer level: Needs assistance   Transfers: Sit to/from Stand Sit to Stand: Min assist, Contact guard assist (x2 from chair and BSC)           General transfer comment: with time and cuing can be CG, but min today.    Ambulation/Gait Ambulation/Gait assistance: Min assist, Contact guard assist Gait Distance (Feet): 100 Feet (X2 with RW and sitting rest in between.) Assistive  device: Rolling walker (2 wheels) Gait Pattern/deviations: Step-through pattern   Gait velocity interpretation: <1.8 ft/sec, indicate of risk for recurrent falls   General Gait Details: Again, short, though steady gait pattern, flexed posture.   Stairs             Wheelchair Mobility     Tilt Bed    Modified Rankin (Stroke Patients Only)       Balance     Sitting balance-[name]: Good       Standing balance-[name]: Fair Standing balance comment: preferring AD for support.                            Cognition Arousal: Alert Behavior During Therapy: Flat affect Overall Cognitive Status: Difficult to assess Area of Impairment: Memory, Safety/judgement, Problem solving                     Memory: Decreased short-term memory   Safety/Judgement: Decreased awareness of deficits   Problem Solving: Slow processing          Exercises      General Comments        Pertinent Vitals/Pain Pain Assessment Pain Assessment: Faces Faces Pain Scale: Hurts a little bit Pain Location: abdomen Pain Descriptors / Indicators: Discomfort, Guarding Pain Intervention(s): Monitored during session    Home Living                          Prior Function  PT Goals (current goals can now be found in the care plan section) Acute Rehab PT Goals PT Goal Formulation: With patient/family Time For Goal Achievement: 12/22/23 Potential to Achieve Goals: Fair Progress towards PT goals: Progressing toward goals    Frequency    Min 1X/week      PT Plan      Co-evaluation              AM-PAC PT "6 Clicks" Mobility   Outcome Measure  Help needed turning from your back to your side while in a flat bed without using bedrails?: A Little Help needed moving from lying on your back to sitting on the side of a flat bed without using bedrails?: A Little Help needed moving to and from a bed to a chair (including a  wheelchair)?: A Little Help needed standing up from a chair using your arms (e.g., wheelchair or bedside chair)?: A Little Help needed to walk in hospital room?: A Little Help needed climbing 3-5 steps with a railing? : A Lot 6 Click Score: 17    End of Session   Activity Tolerance: Patient tolerated treatment well Patient left: in chair;with chair alarm set;with call bell/phone within reach Nurse Communication: Mobility status PT Visit Diagnosis: Other abnormalities of gait and mobility (R26.89);Difficulty in walking, not elsewhere classified (R26.2);Pain Pain - part of body:  (abdomen)     Time: 6213-0865 PT Time Calculation (min) (ACUTE ONLY): 21 min  Charges:    $Gait Training: 8-22 mins PT General Charges $$ ACUTE PT VISIT: 1 Visit                     12/20/2023  Jacinto Halim., PT Acute Rehabilitation Services [phone]  (office)   Christine Morse 12/20/2023, 5:42 PM

## 2023-12-20 NOTE — Progress Notes (Signed)
Occupational Therapy Treatment Patient Details Name: Christine Morse MRN: [id number] DOB: Jul 23, 1936 Today's Date: 12/20/2023   History of present illness Patient is an 87 y/o female admitted 11/23/23 with diagnosis of duodenal adenoma and underwent laparoscopic Whipple procedure. post-op subcapsular fluid collection around the anterior aspect of the left liver lobe. Drain placement in IR 12/03/23. NGT inserted 12/15, dislodged then replaced.  Positive for R subclavian and axillary DVT on 12/07/23. 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   OT comments  Patient goals of care reviewed remain appropriate and advanced as needed.  Patient needing Min A for bed mobility, Mod A to wash her hair, and Min for simple transfer to the recliner.  OT will continue efforts in the acute setting and Patient will benefit from continued inpatient follow up therapy, <3 hours/day       If plan is discharge home, recommend the following:  A lot of help with bathing/dressing/bathroom;A little help with walking and/or transfers;Assist for transportation;Assistance with cooking/housework;Help with stairs or ramp for entrance   Equipment Recommendations  None recommended by OT    Recommendations for Other Services      Precautions / Restrictions Precautions Precautions: Fall Precaution Comments: x2 blake drains, x1 JP drain Restrictions Weight Bearing Restrictions Per Provider Order: No       Mobility Bed Mobility   Bed Mobility: Supine to Sit     Supine to sit: Min assist          Transfers Overall transfer level: Needs assistance Equipment used: 1 person hand held assist Transfers: Sit to/from Stand, Bed to chair/wheelchair/BSC Sit to Stand: Contact guard assist     Step pivot transfers: Min assist           Balance Overall balance assessment: Needs assistance Sitting-balance support: Feet supported Sitting balance-[name]: Good     Standing  balance support: Bilateral upper extremity supported Standing balance-[name]: Fair                             ADL either performed or assessed with clinical judgement   ADL       Grooming: Brushing hair;Supervision/safety;Sitting Grooming Details (indicate cue type and reason): able to sit EOB, wash hair with Mod A and patient combed hair with supervision.                                    Extremity/Trunk Assessment Upper Extremity Assessment Upper Extremity Assessment: Generalized weakness   Lower Extremity Assessment Lower Extremity Assessment: Defer to PT evaluation   Cervical / Trunk Assessment Cervical / Trunk Assessment: Other exceptions Cervical / Trunk Exceptions: abdominal surgery    Vision   Vision Assessment?: No apparent visual deficits   Perception Perception Perception: Not tested   Praxis Praxis Praxis: Not tested    Cognition Arousal: Alert Behavior During Therapy: Flat affect Overall Cognitive Status: Difficult to assess                                 General Comments: following commands, appropriate, doesn't say much unless prompted.        Exercises      Shoulder Instructions       General Comments  NG tube connected to wall, but suction in the off position upon  entering.      Pertinent Vitals/ Pain       Pain Assessment Pain Assessment: No/denies pain Pain Intervention(s): Monitored during session                                                          Frequency  Min 1X/week        Progress Toward Goals  OT Goals(current goals can now be found in the care plan section)  Progress towards OT goals: Progressing toward goals  Acute Rehab OT Goals OT Goal Formulation: With patient Time For Goal Achievement: 01/03/24 Potential to Achieve Goals: Good  Plan      Co-evaluation                 AM-PAC OT "6 Clicks" Daily Activity     Outcome  Measure   Help from another person eating meals?: A Little Help from another person taking care of personal grooming?: A Little Help from another person toileting, which includes using toliet, bedpan, or urinal?: A Lot Help from another person bathing (including washing, rinsing, drying)?: A Lot Help from another person to put on and taking off regular upper body clothing?: A Little Help from another person to put on and taking off regular lower body clothing?: A Lot 6 Click Score: 15    End of Session    OT Visit Diagnosis: Unsteadiness on feet (R26.81);Muscle weakness (generalized) (M62.81)   Activity Tolerance Patient tolerated treatment well   Patient Left in chair;with call bell/phone within reach;with chair alarm set   Nurse Communication Mobility status        Time: 1610-9604 OT Time Calculation (min): 26 min  Charges: OT General Charges $OT Visit: 1 Visit OT Treatments $Self Care/Home Management : 23-37 mins  12/20/2023  RP, OTR/L  Acute Rehabilitation Services  Office:  [phone]   Suzanna Obey 12/20/2023, 8:42 AM

## 2023-12-20 NOTE — Progress Notes (Signed)
This RN observed pt NGT output to now be frank red from the bile color it had been putting out, [facility] office paged x3 no answer as of 0545 Scheduled lovenox held per nursing judgment  0608: no answer from [facility] office after multiple rings, LIWS turned off, tubing emptied to chart full output  0630: last call attempt made, Charge nurse notified

## 2023-12-20 NOTE — Progress Notes (Signed)
27 Days Post-Op   Subjective/Chief Complaint: CC Seen with RN.   Pt reported to have bloody output from NGT this am. NGT clamped 0608 and lovenox held. Patient reports that since ngt has been clamped she has had no abdominal pain, bloating/distension, n/v. BM yesterday. NGT w/ 900cc/24 hours.   Afebrile. No tachycardia (on BB). No hypotension. WBC wnl. Hgb 9.3 from 9.8. Did get 500cc bolus yesterday. Cr non-elevated. BUN wnl.   Objective: Vital signs in last 24 hours: Temp:  [97.6 F (36.4 C)-98.5 F (36.9 C)] 97.7 F (36.5 C) (12/30 0745) Pulse Rate:  [70-83] 75 (12/30 0535) Resp:  [18-20] 20 (12/30 0535) BP: (139-161)/(49-65) 146/62 (12/30 0745) SpO2:  [98 %-100 %] 99 % (12/30 0535) Weight:  [52 kg] 52 kg (12/30 0344) Last BM Date : 12/18/23  Intake/Output from previous day: 12/29 0701 - 12/30 0700 In: 1367.6 [I.V.:1157.6; IV Piggyback:200] Out: 1070 [Emesis/NG output:900; Drains:170] Intake/Output this shift: No intake/output data recorded.  Physical Exam:  General appearance: sitting up in chair. alert, cooperative. MAE's Resp: breathing comfortably GI: soft, non distended. Minimal generalized ttp. No rigidity or guarding. +BS.Midline wound with staples out, cdi without erythema or drainage. NGT with dark red fluid and clamped             D1 - LLQ - 10cc (from  75cc yesterday) - cloudy, red tinged mixed w/ brown appearing fluid             D2 RLQ - 150cc (from 125cc yesterday ) - cloudy, brown, appearing fluid - suture still in place - discussed with RN to ensure secured with tape and gravity bag safety pinned to gown to ensure it does not get pulled.              D3 R abd IR drain - 10cc (from 10cc yesterday) - serous Extremities: No LE edema  Lab Results:  Recent Labs    12/19/23 1057 12/20/23 0441  WBC 9.3 8.4  HGB 9.8* 9.3*  HCT 31.0* 29.2*  PLT 417* 416*    BMET Recent Labs    12/18/23 1400 12/20/23 0441  NA 131* 134*  K 4.1 3.7  CL 106 107  CO2 16*  19*  GLUCOSE 120* 106*  BUN 24* 20  CREATININE 0.45 0.40*  CALCIUM 8.9 8.8*   PT/INR No results for input(s): "LABPROT", "INR" in the last 72 hours.   ABG No results for input(s): "PHART", "HCO3" in the last 72 hours.  Invalid input(s): "PCO2", "PO2"   Studies/Results: No results found.     Anti-infectives: Anti-infectives (From admission, onward)    Start     Dose/Rate Route Frequency Ordered Stop   12/13/23 1700  piperacillin-tazobactam (ZOSYN) IVPB 3.375 g        3.375 g 12.5 mL/hr over 240 Minutes Intravenous Every 8 hours 12/13/23 1412 12/13/23 2139   11/30/23 1230  fluconazole (DIFLUCAN) IVPB 400 mg        400 mg 100 mL/hr over 120 Minutes Intravenous Every 24 hours 11/30/23 1135     11/26/23 0945  piperacillin-tazobactam (ZOSYN) IVPB 3.375 g  Status:  Discontinued        3.375 g 12.5 mL/hr over 240 Minutes Intravenous Every 8 hours 11/26/23 0853 12/13/23 1412   11/23/23 2100  ceFAZolin (ANCEF) IVPB 2g/100 mL premix        2 g 200 mL/hr over 30 Minutes Intravenous Every 8 hours 11/23/23 1654 11/23/23 2214   11/23/23 0700  ceFAZolin (ANCEF) IVPB 2g/100  mL premix        2 g 200 mL/hr over 30 Minutes Intravenous On call to O.R. 11/23/23 0654 11/23/23 1249       Assessment/Plan: [name] is an 87 year old female status post Whipple Procedure on 11/23/2023, (POD 27).  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 Rare candida seen on final culture. Completed Zosyn. On fluconazole. Will need to determine duration of tx. Resolved on CT. Pancreatic leak - Cont surgical and IR drains. Drain output stable Delayed gastric emptying. Qtc ok.  Continue reglan. TPN. Now having bm's and ngt output downtrending. NGT clamping trial today - 4hours clamped, 2 hours on LIWS today. If > 200cc of residual, continue LIWS and do  not repeat clamping trial. If any worsening abdominal pain, distension, n/v - resume LIWS. Do not leave clamped overnight (resume LIWS). Asked RN to notify me what the NGT output looks like when it is hooked back to LIWS next.  HOCM - continue beta blockade and treat hypertension. IV metoprolol to q4h instead of q6 per Cardiology.   Anemia of chronic disease, chronic blood loss anemia, and acute blood loss anemia- S/p 1U PRBC 12/27. Hgb 9.3 from 9.8 yesterday. Repeat in am. If any changes in hemodynamics, can recheck sooner.  Hyperglycemia - improving, monitor   Hypophosphatemia - resolved Hypokalemia - resolved. Pharmacy monitoring.  Severe protein calorie malnutrition - TNA. NGT and NPO RUE DVT - treatment dose lovenox, reviewed with vascular and will continue PICC for now OOB, IS, PT consult.   FEN - TPN. Cont PPI BID & Carafate via tube.  VTE - lovenox treatment dose - cont. Keep picc.   ID - Fluconazole   LOS: 27 days   Jacinto Halim, [facility] 12/20/2023, 11:11 AM Please see Amion for pager number during day hours 7:00am-4:30pm

## 2023-12-21 LAB — CBC
HCT: 26.7 % — ABNORMAL LOW (ref 36.0–46.0)
HCT: 29 % — ABNORMAL LOW (ref 36.0–46.0)
Hemoglobin: 8.1 g/dL — ABNORMAL LOW (ref 12.0–15.0)
Hemoglobin: 9.1 g/dL — ABNORMAL LOW (ref 12.0–15.0)
MCH: 29.5 pg (ref 26.0–34.0)
MCH: 29.4 pg (ref 26.0–34.0)
MCHC: 30.3 g/dL (ref 30.0–36.0)
MCHC: 31.4 g/dL (ref 30.0–36.0)
MCV: 97.1 fL (ref 80.0–100.0)
MCV: 93.5 fL (ref 80.0–100.0)
Platelets: 437 10*3/uL — ABNORMAL HIGH (ref 150–400)
Platelets: 463 10*3/uL — ABNORMAL HIGH (ref 150–400)
RBC: 2.75 MIL/uL — ABNORMAL LOW (ref 3.87–5.11)
RBC: 3.1 MIL/uL — ABNORMAL LOW (ref 3.87–5.11)
RDW: 17.2 % — ABNORMAL HIGH (ref 11.5–15.5)
RDW: 16.8 % — ABNORMAL HIGH (ref 11.5–15.5)
WBC: 7.4 10*3/uL (ref 4.0–10.5)
WBC: 8.6 10*3/uL (ref 4.0–10.5)
nRBC: 0 % (ref 0.0–0.2)
nRBC: 0 % (ref 0.0–0.2)

## 2023-12-21 MED ORDER — INFUVITE ADULT IV SOLN
INTRAVENOUS | Status: AC
  Filled 2023-12-21 (×2): qty 1000

## 2023-12-21 MED ORDER — FAT EMUL FISH OIL/PLANT BASED 20% (SMOFLIPID)IV EMUL
250.0000 mL | INTRAVENOUS | Status: AC
  Administered 2023-12-21: 250 mL via INTRAVENOUS
  Filled 2023-12-21: qty 250

## 2023-12-21 NOTE — Progress Notes (Signed)
 28 Days Post-Op   Subjective/Chief Complaint: CC 500cc out of NGT yesterday am. Clamping trial yesterday without significant output. 200cc this am after back to LIWS overnight. She denies n/v/abdominal pain or bloating.  Objective: Vital signs in last 24 hours: Temp:  [97.6 F (36.4 C)-98.9 F (37.2 C)] 97.6 F (36.4 C) (12/31 0332) Pulse Rate:  [73-90] 90 (12/31 0332) Resp:  [21-22] 22 (12/30 1100) BP: (126-158)/(49-73) 158/73 (12/31 0332) SpO2:  [99 %-100 %] 100 % (12/31 0332) Last BM Date : 12/19/23  Intake/Output from previous day: 12/30 0701 - 12/31 0700 In: 1182.4 [I.V.:977.4; IV Piggyback:200] Out: 400 [Emesis/NG output:200; Drains:200] Intake/Output this shift: No intake/output data recorded.  Physical Exam:  General appearance: sitting up in chair. alert, cooperative. MAE's Resp: breathing comfortably GI: soft, non distended. Minimal generalized ttp. No rigidity or guarding. +BS.Midline wound with staples out, cdi without erythema or drainage. NGT with bilious fluid in cannister. Currently clamped             D1 - LLQ - 100cc (from  10cc yesterday) - cloudy yellow             D2 RLQ - 50cc (from 150cc yesterday ) - cloudy, yellowish- brown, appearing fluid             D3 R abd IR drain - 50cc (from 10cc yesterday) - serous Extremities: No LE edema  Lab Results:  Recent Labs    12/19/23 1057 12/20/23 0441  WBC 9.3 8.4  HGB 9.8* 9.3*  HCT 31.0* 29.2*  PLT 417* 416*    BMET Recent Labs    12/18/23 1400 12/20/23 0441  NA 131* 134*  K 4.1 3.7  CL 106 107  CO2 16* 19*  GLUCOSE 120* 106*  BUN 24* 20  CREATININE 0.45 0.40*  CALCIUM  8.9 8.8*   PT/INR No results for input(s): LABPROT, INR in the last 72 hours.   ABG No results for input(s): PHART, HCO3 in the last 72 hours.  Invalid input(s): PCO2, PO2   Studies/Results: No results found.     Anti-infectives: Anti-infectives (From admission, onward)    Start     Dose/Rate Route  Frequency Ordered Stop   12/13/23 1700  piperacillin -tazobactam (ZOSYN ) IVPB 3.375 g        3.375 g 12.5 mL/hr over 240 Minutes Intravenous Every 8 hours 12/13/23 1412 12/13/23 2139   11/30/23 1230  fluconazole  (DIFLUCAN ) IVPB 400 mg        400 mg 100 mL/hr over 120 Minutes Intravenous Every 24 hours 11/30/23 1135     11/26/23 0945  piperacillin -tazobactam (ZOSYN ) IVPB 3.375 g  Status:  Discontinued        3.375 g 12.5 mL/hr over 240 Minutes Intravenous Every 8 hours 11/26/23 0853 12/13/23 1412   11/23/23 2100  ceFAZolin  (ANCEF ) IVPB 2g/100 mL premix        2 g 200 mL/hr over 30 Minutes Intravenous Every 8 hours 11/23/23 1654 11/23/23 2214   11/23/23 0700  ceFAZolin  (ANCEF ) IVPB 2g/100 mL premix        2 g 200 mL/hr over 30 Minutes Intravenous On call to O.R. 11/23/23 0654 11/23/23 1249       Assessment/Plan: Ms. Formoso is an 87 year old female status post Whipple Procedure on 11/23/2023, (POD 28).  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 Rare candida seen on final culture.  Completed Zosyn . On fluconazole . Will need to determine duration of tx. Resolved on CT. Pancreatic leak - Cont surgical and IR drains. Drain output stable Delayed gastric emptying. Qtc ok.  Continue reglan . TPN. Now having bm's and ngt output downtrending. NGT clamping trial 12/30 without residuals charged. 200 ml out overnight. Leave clamped this am and sips of clears.  If any worsening abdominal pain, distension, n/v - resume LIWS. Do not leave clamped overnight (resume LIWS).  HOCM - continue beta blockade and treat hypertension. IV metoprolol  to q4h instead of q6 per Cardiology.   Anemia of chronic disease, chronic blood loss anemia, and acute blood loss anemia- S/p 1U PRBC 12/27. Hgb 8.1 from 9.3 yesterday. Back on therapeutic lovoenox. HDS. Recheck this  pm Hyperglycemia - improving, monitor   Hypophosphatemia - resolved Hypokalemia - resolved. Pharmacy monitoring.  Severe protein calorie malnutrition - TNA. RUE DVT - treatment dose lovenox , reviewed with vascular and will continue PICC for now OOB, IS, PT consult.   FEN - TPN. Cont PPI BID & Carafate  via tube. Clamp NGT. Sips of clears VTE - lovenox  treatment dose - cont. Keep picc.   ID - Fluconazole  - will discuss with MD duration   LOS: 28 days   [place] VEAR Mais, [facility] 12/21/2023, 7:49 AM Please see Amion for pager number during day hours 7:00am-4:30pm

## 2023-12-21 NOTE — Progress Notes (Signed)
   12/21/23 0000 12/21/23 0400  NG/OG Vented/Dual Lumen 18 Fr. Right nare  Placement Date/Time: 12/15/23 2316   Person Inserting LDA: Autumn TRN  Tube Size (Fr.): 18 Fr.  Tube Location: Right nare  Secured by: (c) Clip  Initial Placement Verification: Xray  Output (mL) 100 mL (bilious) 100 mL (bilious)

## 2023-12-21 NOTE — Progress Notes (Signed)
1330- pt report nausea and discomfort with increased spitting noted. Pt NG reconnected to LIWS and green bile output came into the cannister. Pt voices relief and feeling better. 1530- Pt has 200 bilious output in the cannister. Dionne Bucy RN

## 2023-12-21 NOTE — Progress Notes (Signed)
 Pt offered ice chips or clear liquid but she refused. Another 200 ml bilious output making a total of 400 ml for this shift. Report given to oncoming RN. Dionne Bucy RN

## 2023-12-21 NOTE — Progress Notes (Addendum)
 Nutrition Follow-up  DOCUMENTATION CODES:   Severe malnutrition in context of acute illness/injury  INTERVENTION:   - Diet advancement per MD  - IF diet advanced to clears, recommend adding Boost Breeze po TID, each supplement provides 250 kcal and 9 grams of protein - TPN per pharmacy (Pharmacy to add MVI to TPN)  NUTRITION DIAGNOSIS:   Severe Malnutrition (in the context of acute illness) related to  (inadequate energy intake) as evidenced by energy intake < or equal to 50% for > or equal to 5 days, moderate fat depletion, moderate muscle depletion.  - ongoing   GOAL:   Patient will meet greater than or equal to 90% of their needs  - Progressing  MONITOR:   PO intake, Supplement acceptance, Labs, Weight trends  REASON FOR ASSESSMENT:   Consult Assessment of nutrition requirement/status  ASSESSMENT:   Pt with hx of HTN, HLD, GERD, and diverticulosis presented for planned surgery after a large duodenal mass was seen on imaging at admission in November and was presumed to be malignant. Underwent pancreaticoduodenectomy and placement of pancreatic duct stent and confirmed tohave adenocarcinoma duodenum.  12/3 - Op, whipple procedure and placement of pancreatic duct stent.  12/10 - Soft diet  12/11 CT: possible post-op seroma or hematoma vs abscess, distal colonic diverticulosis  12/12 - TPN started,  12/13 - CT aspiration and drain placement, NPO 12/15 - NG inserted for persistent emesis  12/18- venous duplex positive for VTE; started on treatment dose lovenox  12/20- NGT clamping trials 12/21- NGT d/c, advanced to clear liquids 12/22- advanced to full liquids 12/23 - clear liquids 12/24 - NPO  TPN continuing, pt NPO. Pt with pancreatic leak, still having delayed gastric emptying, on Reglan  Q8. NGT still in place. NGT reported to have bloody output yesterday and was clamped. While NGT was clamped pt reported no abdominal pain. NGT to remain clamped, MD allowing sips of  clears.  Last BM was 12/29. No N/V/abdominal pain noted.   Pt remains on TPN for nutritional support: Clinimix  E 8/10 1L at 42 ml/hr on Sun/Tues/Wed/Thurs/Fri/Sat  Clinimix  E 8/10 2L at 83 ml/hr on Mon only SMOFlipid  250mL daily  Provides a weekly average of 1257 kCal and 92g AA per day, meeting 90% of minimal kCal and 115% of protein needs  Admit weight: 55.3 kg - stated?  Current weight: 52 kg  Hospital Admission Weight History:  12/20/23 0344 52 kg 114.64 lbs 23.97  12/18/23 0500 57.2 kg 126 lbs 26.34  12/17/23 0500 57.2 kg 126 lbs 26.34  12/16/23 0500 57.2 kg 126 lbs 26.34  12/15/23 0500 57.2 kg 126 lbs 26.34  12/13/23 0253 57.2 kg 126 lbs 26.34  12/11/23 0500 57.9 kg 127.65 lbs 26.69  12/09/23 0325 57.9 kg 127.65 lbs 26.69  12/08/23 0300 58.8 kg 129.63 lbs 27.1  12/06/23 0330 57.9 kg 127.65 lbs 26.69  12/05/23 0500 61 kg 134.39 lbs 28.1  12/04/23 0500 61 kg 134.39 lbs 28.1  12/03/23 0500 61 kg 134.4 lbs 28.1  12/02/23 0457 61 kg 134.4 lbs 28.1  11/30/23 1403 61 kg 134.4 lbs 28.1  11/23/23 0701 55.3 kg 122 lbs     Intake/Output Summary (Last 24 hours) at 12/21/2023 0923 Last data filed at 12/21/2023 0400 Gross per 24 hour  Intake 1182.44 ml  Output 400 ml  Net 782.44 ml   Net IO Since Admission: 6,817.14 mL [12/21/23 0923]  Drains/Lines: NG: 200 ml overnight  Closed system drain 1 L LLQ: 100 ml x 24 hours Closed system  drain 1 R RLQ: 50 ml x 24 hours  R Abdomen bulb drain: 50 ml x 24 hours   Average Meal Intake: No recent Meals recorded   Nutritionally Relevant Medications: Scheduled Meds:  Chlorhexidine  Gluconate Cloth  6 each Topical Q0600   enoxaparin  (LOVENOX ) injection  60 mg Subcutaneous Q12H   metoCLOPramide  (REGLAN ) injection  5 mg Intravenous Q8H   metoprolol  tartrate  7.5 mg Intravenous 6 X Daily   pantoprazole  (PROTONIX ) IV  40 mg Intravenous Q12H   sodium chloride  flush  10-40 mL Intracatheter Q12H   sodium chloride  flush  5 mL Intracatheter Q8H    sucralfate   1 g Per Tube TID   Continuous Infusions:  .TPN (CLINIMIX -E) Adult     And   fat emul(SMOFlipid )     fluconazole  (DIFLUCAN ) IV Stopped (12/20/23 1400)   TPN (CLINIMIX -E) Adult 83 mL/hr at 12/21/23 0400   Labs Reviewed: Sodium 134, Creatinine 0.4,   Diet Order:   Diet Order             Diet NPO time specified Except for: Ice Chips, Other (See Comments)  Diet effective now                   EDUCATION NEEDS:   Education needs have been addressed  Skin:  Skin Assessment: Skin Integrity Issues: Skin Integrity Issues:: Incisions Incisions: Abdomen  Last BM:  12/29 type 6, small  Height:   Ht Readings from Last 1 Encounters:  11/23/23 4' 10 (1.473 m)    Weight:   Wt Readings from Last 1 Encounters:  12/20/23 52 kg    Ideal Body Weight:  43.9 kg  BMI:  Body mass index is 23.96 kg/m.  Estimated Nutritional Needs:   Kcal:  1400-1700 kcal/d  Protein:  65-80g/d  Fluid:  1.5-1.7L/d   Olivia Kenning, RD Registered Dietitian  See Amion for more information

## 2023-12-21 NOTE — Progress Notes (Signed)
 Physical Therapy Treatment Patient Details Name: Christine Morse MRN: [id number] DOB: March 31, 1936 Today's Date: 12/21/2023   History of Present Illness Patient is an 87 y/o female admitted 11/23/23 with diagnosis of duodenal adenoma and underwent laparoscopic Whipple procedure. post-op subcapsular fluid collection around the anterior aspect of the left liver lobe. Drain placement in IR 12/03/23. NGT inserted 12/15, dislodged then replaced.  Positive for R subclavian and axillary DVT on 12/07/23. 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    PT Comments  Pt progressing well with mobility. CGA transfers, and CG/min assist amb 180' with RW. Pt in recliner at end of session. Goals reviewed and updated as indicated. Current POC remains appropriate.     If plan is discharge home, recommend the following: A little help with walking and/or transfers;Assistance with cooking/housework;Assist for transportation;Help with stairs or ramp for entrance;A little help with bathing/dressing/bathroom   Can travel by private vehicle     Yes  Equipment Recommendations  Rolling walker (2 wheels);BSC/3in1    Recommendations for Other Services       Precautions / Restrictions Precautions Precautions: Fall;Other (comment) Precaution Comments: x2 blake drains, x1 JP drain, NG tube     Mobility  Bed Mobility               General bed mobility comments: Pt in recliner.    Transfers Overall transfer level: Needs assistance Equipment used: Rolling walker (2 wheels) Transfers: Sit to/from Stand Sit to Stand: Contact guard assist                Ambulation/Gait Ambulation/Gait assistance: Min assist, Contact guard assist Gait Distance (Feet): 180 Feet Assistive device: Rolling walker (2 wheels) Gait Pattern/deviations: Step-through pattern, Decreased stride length, Trunk flexed Gait velocity: decreased Gait velocity interpretation: <1.31 ft/sec, indicative of  household ambulator   General Gait Details: steady gait with RW. Primarily CGA but min assist for RW management 50% of time during turns.   Stairs             Wheelchair Mobility     Tilt Bed    Modified Rankin (Stroke Patients Only)       Balance Overall balance assessment: Needs assistance Sitting-balance support: Feet supported, No upper extremity supported Sitting balance-[name]: Good     Standing balance support: Bilateral upper extremity supported, During functional activity, No upper extremity supported Standing balance-[name]: Fair Standing balance comment: preferring AD for support.                            Cognition Arousal: Alert Behavior During Therapy: Flat affect Overall Cognitive Status: Difficult to assess                               Problem Solving: Slow processing, Requires verbal cues General Comments: following commands, appropriate, doesn't say much unless prompted.        Exercises      General Comments General comments (skin integrity, edema, etc.): VSS on RA      Pertinent Vitals/Pain Pain Assessment Pain Assessment: Faces Faces Pain Scale: Hurts a little bit Pain Location: abdomen Pain Descriptors / Indicators: Discomfort, Guarding Pain Intervention(s): Monitored during session    Home Living                          Prior Function  PT Goals (current goals can now be found in the care plan section) Acute Rehab PT Goals Patient Stated Goal: return to independent PT Goal Formulation: With patient Time For Goal Achievement: 01/04/24 Potential to Achieve Goals: Fair Progress towards PT goals: Progressing toward goals    Frequency    Min 1X/week      PT Plan      Co-evaluation              AM-PAC PT 6 Clicks Mobility   Outcome Measure  Help needed turning from your back to your side while in a flat bed without using bedrails?: A Little Help  needed moving from lying on your back to sitting on the side of a flat bed without using bedrails?: A Little Help needed moving to and from a bed to a chair (including a wheelchair)?: A Little Help needed standing up from a chair using your arms (e.g., wheelchair or bedside chair)?: A Little Help needed to walk in hospital room?: A Little Help needed climbing 3-5 steps with a railing? : A Lot 6 Click Score: 17    End of Session Equipment Utilized During Treatment: Gait belt Activity Tolerance: Patient tolerated treatment well Patient left: in chair;with call bell/phone within reach Nurse Communication: Mobility status PT Visit Diagnosis: Other abnormalities of gait and mobility (R26.89);Difficulty in walking, not elsewhere classified (R26.2);Pain     Time: 0906-0920 PT Time Calculation (min) (ACUTE ONLY): 14 min  Charges:    $Gait Training: 8-22 mins PT General Charges $$ ACUTE PT VISIT: 1 Visit                     Sari MATSU., PT  Office # [phone]    Erven Sari Shaker 12/21/2023, 12:39 PM

## 2023-12-21 NOTE — Progress Notes (Signed)
 PHARMACY - TOTAL PARENTERAL NUTRITION CONSULT NOTE  Indication:  delayed gastric emptying  Patient Measurements: Height: 4' 10 (147.3 cm) Weight: 52 kg (114 lb 10.2 oz) IBW/kg (Calculated) : 40.9 TPN AdjBW (KG): 44.5 Body mass index is 23.96 kg/m. 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  Patient was started on a CLD on 12/6-12/9 and then advanced to a soft diet since 12/10.  Patient has had minimal PO intake since being post-op and continues to experience nausea. Delayed gastric emptying likely related to possible pancreatic leak.  Pharmacy consulted to dose TPN.  Glucose / Insulin : A1c 3.7 (11/15/23) - glucoses < 180. SSI D/C'ed 12/08/23. Electrolytes: 12/30 - Na: 134, K: 3.7, HCO3: 19, CoCa: 10.0, Mag: 1.8 Renal: SCr < 1, BUN WNL Hepatic: LFTs / tbili WNL, albumin  2.5 Intake / Output; MIVF: UOP not charted (1x unmeasured), NG reinserted 12/24 - 200 mL, drain 200 mL, LBM 12/29 (s/p Reglan  x3 doses 12/11-12/12, restarted 12/15) GI Imaging: 12/07 Ab XR: unremarkable bowel gas pattern. 12/11 CT: possible post-op seroma or hematoma vs abscess, distal colonic diverticulosis 12/15 Ab XR: expected post-op changes, NGT in correct place 12/26- CT ABD- perc drain cath adj left lobe liver- no discernable residual fluid collection. Post whipple w/ no evidence pancreatic ductal dilatation or peripancreatic fluid collection, small amount of free fluid in pelvis GI Surgeries / Procedures:  12/3 diagnostic laparoscopy, Whipple procedure, pancreatic duct stent 12/13 subhepatic fluid collection aspiration and drainage  Central access: PICC placed 12/01/23 TPN start date: 12/02/23  Nutritional Goals: Clinimix  E 8/10 1L at 42 ml/hr on Sun/Tues/Wed/Thurs/Fri/Sat Clinimix  E 8/10 2L at 83 ml/hr on Mon SMOF lipids 250 ml daily  Provides 100% AA goal  and ~95% of kCal goal  Electrolytes in Clinimix  E 1L bag: Na , K , Mag , Ca 4.74mEq, Phos , Ac , Cl 39mEq- on national back order Electrolytes in Clnimix E 8/10 2L bag: Na 70 mEq, K 60 mEq, Mag 10 mEq, Ca 9 mEq, Phos 30 mmol, Acetate 166 , Cl 152 mEq   RD Estimated Needs Total Energy Estimated Needs: 1400-1700 kcal/d Total Protein Estimated Needs: 65-80g/d Total Fluid Estimated Needs: 1.5-1.7L/d  Current Nutrition:  TPN NPO   Plan:  Clinimix  E 8/10 1L at 42 ml/hr on Sun/Tues/Wed/Thurs/Fri/Sat - note that 1L bags on backorder, may need to be adjusted at the end of this week.  Clinimix  E 8/10 2L at 83 ml/hr on Mon only SMOFlipid  250mL daily  Provides a weekly average of 1257 kCal and 92g AA per day, meeting 90% of minimal kCal and 115% of protein needs Add MVI and trace elements to TPN Monitor TPN labs on Mon/Thurs Ability to advance diet as able.   Powell Blush, PharmD, BCCCP  Clinical Pharmacist 12/21/2023 7:18 AM  Contact: [phone] after 3 PM

## 2023-12-22 LAB — CBC
HCT: 29.8 % — ABNORMAL LOW (ref 36.0–46.0)
Hemoglobin: 9.3 g/dL — ABNORMAL LOW (ref 12.0–15.0)
MCH: 28.5 pg (ref 26.0–34.0)
MCHC: 31.2 g/dL (ref 30.0–36.0)
MCV: 91.4 fL (ref 80.0–100.0)
Platelets: 478 10*3/uL — ABNORMAL HIGH (ref 150–400)
RBC: 3.26 MIL/uL — ABNORMAL LOW (ref 3.87–5.11)
RDW: 16.5 % — ABNORMAL HIGH (ref 11.5–15.5)
WBC: 9.7 10*3/uL (ref 4.0–10.5)
nRBC: 0 % (ref 0.0–0.2)

## 2023-12-22 MED ORDER — FAT EMUL FISH OIL/PLANT BASED 20% (SMOFLIPID)IV EMUL
250.0000 mL | INTRAVENOUS | Status: AC
  Administered 2023-12-22: 250 mL via INTRAVENOUS
  Filled 2023-12-22: qty 250

## 2023-12-22 MED ORDER — NYSTATIN 100000 UNIT/ML MT SUSP
5.0000 mL | Freq: Four times a day (QID) | OROMUCOSAL | Status: DC
  Administered 2023-12-22 – 2024-01-13 (×39): 500000 [IU] via ORAL
  Filled 2023-12-22 (×74): qty 5

## 2023-12-22 MED ORDER — TRACE MINERALS CU-MN-SE-ZN 300-55-60-3000 MCG/ML IV SOLN
INTRAVENOUS | Status: AC
  Filled 2023-12-22 (×2): qty 1000

## 2023-12-22 NOTE — Progress Notes (Signed)
 PHARMACY - TOTAL PARENTERAL NUTRITION CONSULT NOTE  Indication:  delayed gastric emptying  Patient Measurements: Height: 4' 10 (147.3 cm) Weight: 52 kg (114 lb 10.2 oz) IBW/kg (Calculated) : 40.9 TPN AdjBW (KG): 44.5 Body mass index is 23.96 kg/m. 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  Patient was started on a CLD on 12/6-12/9 and then advanced to a soft diet since 12/10.  Patient has had minimal PO intake since being post-op and continues to experience nausea. Delayed gastric emptying likely related to possible pancreatic leak.  Pharmacy consulted to dose TPN.  Glucose / Insulin : A1c 3.7 (11/15/23) - glucoses < 180. SSI D/C'ed 12/08/23. Electrolytes: 12/30 - Na: 134, K: 3.7, HCO3: 19, CoCa: 10.0, Mag: 1.8 Renal: SCr < 1, BUN WNL Hepatic: LFTs / tbili WNL, albumin  2.5 Intake / Output; MIVF: UOP not charted, NGT back to suction - 900 mL output, drain 298 mL, LBM 12/29 (s/p Reglan  x3 doses 12/11-12/12, restarted 12/15) GI Imaging: 12/07 Ab XR: unremarkable bowel gas pattern. 12/11 CT: possible post-op seroma or hematoma vs abscess, distal colonic diverticulosis 12/15 Ab XR: expected post-op changes, NGT in correct place 12/26- CT ABD- perc drain cath adj left lobe liver- no discernable residual fluid collection. Post whipple w/ no evidence pancreatic ductal dilatation or peripancreatic fluid collection, small amount of free fluid in pelvis GI Surgeries / Procedures:  12/3 diagnostic laparoscopy, Whipple procedure, pancreatic duct stent 12/13 subhepatic fluid collection aspiration and drainage  Central access: PICC placed 12/01/23 TPN start date: 12/02/23  Nutritional Goals: Clinimix  E 8/10 1L at 42 ml/hr on Sun/Tues/Wed/Thurs/Fri/Sat Clinimix  E 8/10 2L at 83 ml/hr on Mon SMOF lipids 250 ml daily  Provides 100% AA goal and ~95% of  kCal goal  Electrolytes in Clinimix  E 1L bag: Na , K , Mag , Ca 4.44mEq, Phos , Ac , Cl 39mEq- on national back order Electrolytes in Clnimix E 8/10 2L bag: Na 70 mEq, K 60 mEq, Mag 10 mEq, Ca 9 mEq, Phos 30 mmol, Acetate 166 , Cl 152 mEq   RD Estimated Needs Total Energy Estimated Needs: 1400-1700 kcal/d Total Protein Estimated Needs: 65-80g/d Total Fluid Estimated Needs: 1.5-1.7L/d  Current Nutrition:  TPN NPO   Plan:  Clinimix  E 8/10 1L at 42 ml/hr on Sun/Tues/Wed/Thurs/Fri/Sat - note that 1L bags on backorder, may need to be adjusted at the end of this week.  Clinimix  E 8/10 2L at 83 ml/hr on Mon only SMOFlipid  daily  Provides a weekly average of 1257 kCal and 92g AA per day, meeting 90% of minimal kCal and 115% of protein needs Add MVI and trace elements to TPN Monitor TPN labs on Mon/Thurs Ability to advance diet as able.   Rankin Sams, PharmD, BCPS, BCCCP Clinical Pharmacist

## 2023-12-22 NOTE — Progress Notes (Signed)
 29 Days Post-Op   Subjective/Chief Complaint: NGT returned to suction yesterday due to nausea. 200 ml out over 2 hours twice yesterday. RN noted some white plaques to hard palate. No BM for several days  Objective: Vital signs in last 24 hours: Temp:  [97.9 F (36.6 C)-99.2 F (37.3 C)] 99.2 F (37.3 C) (01/01 0735) Pulse Rate:  [78-86] 86 (01/01 0735) Resp:  [17-20] 20 (01/01 0735) BP: (135-143)/(54-63) 143/56 (01/01 0735) SpO2:  [94 %-100 %] 99 % (01/01 0735) Weight:  [52 kg] 52 kg (01/01 0500) Last BM Date : 12/19/23  Intake/Output from previous day: 12/31 0701 - 01/01 0700 In: 1956.7 [I.V.:1751.7; IV Piggyback:200] Out: 1398 [Emesis/NG output:1150; Drains:248] Intake/Output this shift: No intake/output data recorded.  Physical Exam:  General appearance: sitting up in chair. alert, cooperative. MAE's Resp: breathing comfortably GI: soft, non distended. Minimal generalized ttp. No rigidity or guarding. +BS.Midline wound with staples out, cdi without erythema or drainage. NGT with bilious fluid in cannister. Currently clamped             D1 - LLQ - 180cc (from  100cc yesterday) - cloudy yellow             D2 RLQ - 65cc (from 50cc yesterday ) - cloudy, yellowish- brown, appearing fluid             D3 R abd IR drain - 3cc (from 50cc yesterday) - serous Extremities: No LE edema  Lab Results:  Recent Labs    12/21/23 1459 12/22/23 0630  WBC 8.6 9.7  HGB 9.1* 9.3*  HCT 29.0* 29.8*  PLT 463* 478*    BMET Recent Labs    12/20/23 0441  NA 134*  K 3.7  CL 107  CO2 19*  GLUCOSE 106*  BUN 20  CREATININE 0.40*  CALCIUM  8.8*   PT/INR No results for input(s): LABPROT, INR in the last 72 hours.   ABG No results for input(s): PHART, HCO3 in the last 72 hours.  Invalid input(s): PCO2, PO2   Studies/Results: No results found.     Anti-infectives: Anti-infectives (From admission, onward)    Start     Dose/Rate Route Frequency Ordered Stop    12/13/23 1700  piperacillin -tazobactam (ZOSYN ) IVPB 3.375 g        3.375 g 12.5 mL/hr over 240 Minutes Intravenous Every 8 hours 12/13/23 1412 12/13/23 2139   11/30/23 1230  fluconazole  (DIFLUCAN ) IVPB 400 mg        400 mg 100 mL/hr over 120 Minutes Intravenous Every 24 hours 11/30/23 1135     11/26/23 0945  piperacillin -tazobactam (ZOSYN ) IVPB 3.375 g  Status:  Discontinued        3.375 g 12.5 mL/hr over 240 Minutes Intravenous Every 8 hours 11/26/23 0853 12/13/23 1412   11/23/23 2100  ceFAZolin  (ANCEF ) IVPB 2g/100 mL premix        2 g 200 mL/hr over 30 Minutes Intravenous Every 8 hours 11/23/23 1654 11/23/23 2214   11/23/23 0700  ceFAZolin  (ANCEF ) IVPB 2g/100 mL premix        2 g 200 mL/hr over 30 Minutes Intravenous On call to O.R. 11/23/23 0654 11/23/23 1249       Assessment/Plan: Christine Morse is an 88 year old female status post Whipple Procedure on 11/23/2023, (POD 29).  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 Rare candida seen on final culture.  Completed Zosyn . On fluconazole . Will need to determine duration of tx. Resolved on CT. Pancreatic leak - Cont surgical and IR drains. Drain output overall stable Delayed gastric emptying. Qtc ok.  Continue reglan . TPN. Failed clamping trial yesterday. No BM for several days. LIWS today HOCM - continue beta blockade and treat hypertension. IV metoprolol  to q4h instead of q6 per Cardiology.   Anemia of chronic disease, chronic blood loss anemia, and acute blood loss anemia- S/p 1U PRBC 12/27. Hgb stable at 9.3. Back on therapeutic lovoenox. HDS. Hyperglycemia - improving, monitor   Hypophosphatemia - resolved Hypokalemia - resolved. Pharmacy monitoring.  Severe protein calorie malnutrition - TNA. RUE DVT - treatment dose lovenox , reviewed with vascular and will continue PICC for  now OOB, IS, PT consult.  Thrush - nystatin  suspension  FEN - TPN. Cont PPI BID & Carafate  via tube. Clamp NGT. Sips of clears VTE - lovenox  treatment dose - cont. Keep picc.   ID - Fluconazole ,    LOS: 29 days   Christine Morse, [facility] Surgery 12/22/2023, 8:29 AM Please see Amion for pager number during day hours 7:00am-4:30pm

## 2023-12-23 ENCOUNTER — Inpatient Hospital Stay (HOSPITAL_COMMUNITY): Payer: Medicare Other

## 2023-12-23 LAB — COMPREHENSIVE METABOLIC PANEL
ALT: 17 U/L (ref 0–44)
AST: 14 U/L — ABNORMAL LOW (ref 15–41)
Albumin: 2.3 g/dL — ABNORMAL LOW (ref 3.5–5.0)
Alkaline Phosphatase: 128 U/L — ABNORMAL HIGH (ref 38–126)
Anion gap: 6 (ref 5–15)
BUN: 19 mg/dL (ref 8–23)
CO2: 20 mmol/L — ABNORMAL LOW (ref 22–32)
Calcium: 8.9 mg/dL (ref 8.9–10.3)
Chloride: 110 mmol/L (ref 98–111)
Creatinine, Ser: 0.41 mg/dL — ABNORMAL LOW (ref 0.44–1.00)
GFR, Estimated: >60 mL/min (ref >60)
Glucose, Bld: 137 mg/dL — ABNORMAL HIGH (ref 70–99)
Potassium: 3.6 mmol/L (ref 3.5–5.1)
Sodium: 136 mmol/L (ref 135–145)
Total Bilirubin: 0.2 mg/dL (ref 0.0–1.2)
Total Protein: 5.7 g/dL — ABNORMAL LOW (ref 6.5–8.1)

## 2023-12-23 LAB — CBC
HCT: 29 % — ABNORMAL LOW (ref 36.0–46.0)
Hemoglobin: 9.2 g/dL — ABNORMAL LOW (ref 12.0–15.0)
MCH: 29.2 pg (ref 26.0–34.0)
MCHC: 31.7 g/dL (ref 30.0–36.0)
MCV: 92.1 fL (ref 80.0–100.0)
Platelets: 506 10*3/uL — ABNORMAL HIGH (ref 150–400)
RBC: 3.15 MIL/uL — ABNORMAL LOW (ref 3.87–5.11)
RDW: 16.5 % — ABNORMAL HIGH (ref 11.5–15.5)
WBC: 12.6 10*3/uL — ABNORMAL HIGH (ref 4.0–10.5)
nRBC: 0 % (ref 0.0–0.2)

## 2023-12-23 LAB — PHOSPHORUS: Phosphorus: 2.8 mg/dL (ref 2.5–4.6)

## 2023-12-23 LAB — MAGNESIUM: Magnesium: 1.7 mg/dL (ref 1.7–2.4)

## 2023-12-23 MED ORDER — IOHEXOL 9 MG/ML PO SOLN
500.0000 mL | ORAL | Status: AC
  Administered 2023-12-23: 500 mL via ORAL

## 2023-12-23 MED ORDER — IOHEXOL 12 MG/ML PO SOLN
500.0000 mL | ORAL | Status: DC
  Administered 2023-12-23: 500 mL via ORAL

## 2023-12-23 MED ORDER — ENOXAPARIN SODIUM 60 MG/0.6ML IJ SOSY
50.0000 mg | PREFILLED_SYRINGE | Freq: Two times a day (BID) | INTRAMUSCULAR | Status: DC
  Administered 2023-12-23 – 2024-01-18 (×47): 50 mg via SUBCUTANEOUS
  Filled 2023-12-23 (×49): qty 0.6

## 2023-12-23 MED ORDER — IOHEXOL 350 MG/ML SOLN
75.0000 mL | Freq: Once | INTRAVENOUS | Status: AC | PRN
  Administered 2023-12-23: 75 mL via INTRAVENOUS

## 2023-12-23 MED ORDER — ACETAMINOPHEN 325 MG PO TABS
650.0000 mg | ORAL_TABLET | Freq: Four times a day (QID) | ORAL | Status: DC | PRN
  Administered 2023-12-23 – 2024-01-01 (×3): 650 mg
  Filled 2023-12-23 (×4): qty 2

## 2023-12-23 MED ORDER — SODIUM ACETATE 2 MEQ/ML IV SOLN
42.0000 mL/h | INTRAVENOUS | Status: AC
  Filled 2023-12-23: qty 1000

## 2023-12-23 MED ORDER — FAT EMUL FISH OIL/PLANT BASED 20% (SMOFLIPID)IV EMUL
250.0000 mL | INTRAVENOUS | Status: AC
  Administered 2023-12-23: 250 mL via INTRAVENOUS
  Filled 2023-12-23: qty 250

## 2023-12-23 NOTE — Progress Notes (Signed)
   12/23/23 1625  Vitals  Temp (!) 102.6 F (39.2 C)  Temp Source Oral  BP (!) 153/59  MAP (mmHg) 84  BP Location Left Arm  BP Method Automatic  Patient Position (if appropriate) Lying  Pulse Rate 85  Pulse Rate Source Monitor  Resp 20  Level of Consciousness  Level of Consciousness Alert  MEWS COLOR  MEWS Score Color Yellow  Oxygen Therapy  SpO2 93 %  O2 Device Room Air  MEWS Score  MEWS Temp 2  MEWS Systolic 0  MEWS Pulse 0  MEWS RR 0  MEWS LOC 0  MEWS Score 2   Pt back from CT to room; pt had a Temp and Dr. Sebastian notified. Order to give prn tylenol  received. Pt educated on deep breathing coughing exercises and IS use encouraged. P. Amo Ibraheem Voris RN

## 2023-12-23 NOTE — Progress Notes (Signed)
 PHARMACY - TOTAL PARENTERAL NUTRITION CONSULT NOTE  Indication:  delayed gastric emptying  Patient Measurements: Height: 4' 10 (147.3 cm) Weight: 52 kg (114 lb 10.2 oz) IBW/kg (Calculated) : 40.9 TPN AdjBW (KG): 44.5 Body mass index is 23.96 kg/m. 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  Patient was started on a CLD on 12/6-12/9 and then advanced to a soft diet since 12/10.  Patient has had minimal PO intake since being post-op and continues to experience nausea. Delayed gastric emptying likely related to possible pancreatic leak.  Pharmacy consulted to dose TPN.  Glucose / Insulin : A1c 3.7 (11/15/23) - glucoses < 180. SSI D/C'ed 12/08/23. Electrolytes: 1/2 - Na: 136, K: 3.6, HCO3: 20, Mag: 1.7, Phos 2.8 Renal: SCr < 1, BUN WNL Hepatic: LFTs / tbili WNL, Alk phos 128, albumin  2.3 Intake / Output; MIVF: UOP not charted, NGT back to suction - 900>650 mL output, drain 269 mL, LBM 12/29 (s/p Reglan  x3 doses 12/11-12/12, restarted 12/15) GI Imaging: 12/07 Ab XR: unremarkable bowel gas pattern. 12/11 CT: possible post-op seroma or hematoma vs abscess, distal colonic diverticulosis 12/15 Ab XR: expected post-op changes, NGT in correct place 12/26- CT ABD- perc drain cath adj left lobe liver- no discernable residual fluid collection. Post whipple w/ no evidence pancreatic ductal dilatation or peripancreatic fluid collection, small amount of free fluid in pelvis GI Surgeries / Procedures:  12/3 diagnostic laparoscopy, Whipple procedure, pancreatic duct stent 12/13 subhepatic fluid collection aspiration and drainage  Central access: PICC placed 12/01/23 TPN start date: 12/02/23  Nutritional Goals: Clinimix  E 8/10 1L at 42 ml/hr on Sun/Tues/Wed/Thurs/Fri/Sat Clinimix  E 8/10 2L at 83 ml/hr on Mon SMOF lipids 250 ml daily  Provides 100% AA  goal and ~95% of kCal goal  Electrolytes in Clinimix  E 1L bag: Na , K , Mag , Ca 4.32mEq, Phos , Ac , Cl 39mEq- on national back order Electrolytes in Clnimix E 8/10 2L bag: Na 70 mEq, K 60 mEq, Mag 10 mEq, Ca 9 mEq, Phos 30 mmol, Acetate 166 , Cl 152 mEq   RD Estimated Needs Total Energy Estimated Needs: 1400-1700 kcal/d Total Protein Estimated Needs: 65-80g/d Total Fluid Estimated Needs: 1.5-1.7L/d  Current Nutrition:  TPN NPO   Plan:  Clinimix  electrolyte bags no longer available, will switch to Clinimix  w/o electrolytes at 42 mL/hr Sun/Tues/Wed/Thurs/Fri/Sat and 83 mL/hr Mon only SMOFlipid  daily  Provides a weekly average of 1257 kCal and 92g AA per day, meeting 90% of minimal kCal and 115% of protein needs Add MVI/trace elements/NaAcet/NaPhos/Mg/CaGluc/KCl/K Acet to TPN with unavailability of Clinimix  E Add BMP tomorrow with above changes Monitor TPN labs on Mon/Thurs Ability to advance diet as able.   Dorn Poot, PharmD, [facility] Clinical Pharmacist ED Pharmacist Phone # [phone] 12/23/2023 8:31 AM

## 2023-12-23 NOTE — Progress Notes (Signed)
Pt off unit to CT. P. Amo Quinita Kostelecky RN 

## 2023-12-23 NOTE — TOC Progression Note (Addendum)
 Transition of Care [facility]) - Progression Note    Patient Details  Name: Christine Morse MRN: [id number] Date of Birth: 04/07/1936  Transition of Care [facility]) CM/SW Contact  Montie LOISE Louder, [place] Phone Number: 12/23/2023, 11:15 AM  Clinical Narrative:      TOC continues to follow    Barriers to Discharge: Continued Medical Work up  Expected Discharge Plan and Services In-house Referral: Clinical Social Work   Post Acute Care Choice: Skilled Nursing Facility Living arrangements for the past 2 months: Single Family Home                                       Social Determinants of Health (SDOH) Interventions SDOH Screenings   Food Insecurity: No Food Insecurity (11/23/2023)  Housing: Patient Declined (11/23/2023)  Transportation Needs: No Transportation Needs (11/23/2023)  Utilities: Not At Risk (11/23/2023)  Social Connections: Unknown (12/21/2023)  Tobacco Use: Low Risk  (11/23/2023)  Recent Concern: Tobacco Use - Medium Risk (11/07/2023)    Readmission Risk Interventions    10/27/2023    9:03 AM  Readmission Risk Prevention Plan  Transportation Screening Complete  PCP or Specialist Appt within 5-7 Days Complete  Home Care Screening Complete  Medication Review (RN CM) Complete

## 2023-12-23 NOTE — Progress Notes (Signed)
 Occupational Therapy Treatment Patient Details Name: Christine Morse MRN: [id number] DOB: [birth year] Today's Date: 12/23/2023   History of present illness Patient is an 88 y/o female admitted 11/23/23 with diagnosis of duodenal adenoma and underwent laparoscopic Whipple procedure. post-op subcapsular fluid collection around the anterior aspect of the left liver lobe. Drain placement in IR 12/03/23. NGT inserted 12/15, dislodged then replaced.  Positive for R subclavian and axillary DVT on 12/07/23. 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   OT comments  Pt progressing towards OT goals this session. Min A for bed mobility with use of rails, close guard for in room mobility to bathroom with RW, able to stand at sink for grooming tasks. Demonstrated use of AE kit to increase access to LB for ADL. Pt returned supine by request at end of session. OT will continue to follow acutely. POC remains appropriate at this time.       If plan is discharge home, recommend the following:  A lot of help with bathing/dressing/bathroom;A little help with walking and/or transfers;Assist for transportation;Assistance with cooking/housework;Help with stairs or ramp for entrance   Equipment Recommendations  None recommended by OT    Recommendations for Other Services      Precautions / Restrictions Precautions Precautions: Fall Precaution Comments: x2 blake drains, x1 JP drain, NG tube Restrictions Weight Bearing Restrictions Per Provider Order: No       Mobility Bed Mobility Overal bed mobility: Needs Assistance Bed Mobility: Supine to Sit     Supine to sit: Min assist, HOB elevated, Used rails          Transfers Overall transfer level: Needs assistance Equipment used: Rolling walker (2 wheels) Transfers: Sit to/from Stand Sit to Stand: Contact guard assist           General transfer comment: min guard with vc for safe hand placement     Balance Overall  balance assessment: Needs assistance Sitting-balance support: Feet supported, No upper extremity supported Sitting balance-[name]: Good Sitting balance - Comments: on EOB   Standing balance support: Bilateral upper extremity supported, During functional activity, No upper extremity supported Standing balance-[name]: Fair Standing balance comment: preferring AD for support.                           ADL either performed or assessed with clinical judgement   ADL Overall ADL's : Needs assistance/impaired     Grooming: Wash/dry face;Contact guard assist;Standing Grooming Details (indicate cue type and reason): sink         Upper Body Dressing : Sitting;Minimal assistance;Standing Upper Body Dressing Details (indicate cue type and reason): new gown, gown like robe Lower Body Dressing: Moderate assistance;Sit to/from stand Lower Body Dressing Details (indicate cue type and reason): donning slippers Toilet Transfer: Contact guard assist;Ambulation;Rolling walker (2 wheels)           Functional mobility during ADLs: Contact guard assist;Rolling walker (2 wheels) General ADL Comments: provided demonstration for AE kit for LB    Extremity/Trunk Assessment Upper Extremity Assessment Upper Extremity Assessment: Generalized weakness            Vision       Perception     Praxis      Cognition Arousal: Alert Behavior During Therapy: Flat affect Overall Cognitive Status: No family/caregiver present to determine baseline cognitive functioning  General Comments: flat affect, follows commands appropriately.        Exercises      Shoulder Instructions       General Comments VSS on RA    Pertinent Vitals/ Pain       Pain Assessment Pain Assessment: Faces Faces Pain Scale: Hurts a little bit Pain Location: abdomen Pain Descriptors / Indicators: Discomfort, Guarding Pain Intervention(s): Limited  activity within patient's tolerance, Monitored during session, Repositioned  Home Living                                          Prior Functioning/Environment              Frequency  Min 1X/week        Progress Toward Goals  OT Goals(current goals can now be found in the care plan section)  Progress towards OT goals: Progressing toward goals  Acute Rehab OT Goals Patient Stated Goal: get better OT Goal Formulation: With patient Time For Goal Achievement: 01/03/24 Potential to Achieve Goals: Good  Plan      Co-evaluation                 AM-PAC OT 6 Clicks Daily Activity     Outcome Measure   Help from another person eating meals?: A Little Help from another person taking care of personal grooming?: A Little Help from another person toileting, which includes using toliet, bedpan, or urinal?: A Lot Help from another person bathing (including washing, rinsing, drying)?: A Lot Help from another person to put on and taking off regular upper body clothing?: A Little Help from another person to put on and taking off regular lower body clothing?: A Lot 6 Click Score: 15    End of Session Equipment Utilized During Treatment: Rolling walker (2 wheels)  OT Visit Diagnosis: Unsteadiness on feet (R26.81);Muscle weakness (generalized) (M62.81)   Activity Tolerance Patient tolerated treatment well   Patient Left in bed;with call bell/phone within reach;with bed alarm set   Nurse Communication Mobility status        Time: 1017-1040 OT Time Calculation (min): 23 min  Charges: OT General Charges $OT Visit: 1 Visit OT Treatments $Self Care/Home Management : 8-22 mins  Leita DEL OTR/L Acute Rehabilitation Services Office: [phone]  Leita PARAS [facility] 12/23/2023, 1:58 PM

## 2023-12-23 NOTE — Progress Notes (Signed)
 30 Days Post-Op   Subjective/Chief Complaint: Her daughter is at bedside.   Patient reports intermittent central/upper abdominal pain. Not sure if anything brings this on. None currently. No nausea. NGT bilious, 650cc/24 hours. No flatus or bm. Voiding.   Tmax 99.6. WBC 12.6  Objective: Vital signs in last 24 hours: Temp:  [97.5 F (36.4 C)-99.6 F (37.6 C)] 98.4 F (36.9 C) (01/02 0759) Pulse Rate:  [77-99] 89 (01/02 0759) Resp:  [16-20] 16 (01/02 0759) BP: (103-146)/(46-64) 142/59 (01/02 0759) SpO2:  [96 %-100 %] 97 % (01/02 0759) Weight:  [52 kg] 52 kg (01/02 0500) Last BM Date : 12/19/23  Intake/Output from previous day: 01/01 0701 - 01/02 0700 In: 1509.3 [P.O.:100; I.V.:1204.3; IV Piggyback:200] Out: 919 [Emesis/NG output:650; Drains:269] Intake/Output this shift: No intake/output data recorded.  Physical Exam:  General appearance: sitting up in chair. alert, cooperative. MAE's Resp: breathing comfortably GI: soft, non distended. Minimal generalized ttp. No rigidity or guarding. +BS. Midline wound with staples out. There is erythema and induration extending from right drain towards midline - no fluctuance or drainage. NGT with bilious fluid in cannister. Currently clamped             D1 - LLQ - 135cc (from  180cc yesterday) - yellow, purulent appearing             D2 RLQ - 120cc (from 60cc yesterday ) - yellow, purulent appearing             D3 R abd IR drain - 14cc (from 3cc yesterday) - serous Extremities: No LE edema  Lab Results:  Recent Labs    12/22/23 0630 12/23/23 0616  WBC 9.7 12.6*  HGB 9.3* 9.2*  HCT 29.8* 29.0*  PLT 478* 506*    BMET Recent Labs    12/23/23 0616  NA 136  K 3.6  CL 110  CO2 20*  GLUCOSE 137*  BUN 19  CREATININE 0.41*  CALCIUM  8.9   PT/INR No results for input(s): LABPROT, INR in the last 72 hours.   ABG No results for input(s): PHART, HCO3 in the last 72 hours.  Invalid input(s): PCO2,  PO2   Studies/Results: No results found.     Anti-infectives: Anti-infectives (From admission, onward)    Start     Dose/Rate Route Frequency Ordered Stop   12/13/23 1700  piperacillin -tazobactam (ZOSYN ) IVPB 3.375 g        3.375 g 12.5 mL/hr over 240 Minutes Intravenous Every 8 hours 12/13/23 1412 12/13/23 2139   11/30/23 1230  fluconazole  (DIFLUCAN ) IVPB 400 mg        400 mg 100 mL/hr over 120 Minutes Intravenous Every 24 hours 11/30/23 1135     11/26/23 0945  piperacillin -tazobactam (ZOSYN ) IVPB 3.375 g  Status:  Discontinued        3.375 g 12.5 mL/hr over 240 Minutes Intravenous Every 8 hours 11/26/23 0853 12/13/23 1412   11/23/23 2100  ceFAZolin  (ANCEF ) IVPB 2g/100 mL premix        2 g 200 mL/hr over 30 Minutes Intravenous Every 8 hours 11/23/23 1654 11/23/23 2214   11/23/23 0700  ceFAZolin  (ANCEF ) IVPB 2g/100 mL premix        2 g 200 mL/hr over 30 Minutes Intravenous On call to O.R. 11/23/23 0654 11/23/23 1249       Assessment/Plan: Christine Morse is an 88 year old female status post Whipple Procedure on 11/23/2023, (POD 30).  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 Rare candida seen on final culture. Completed Zosyn . On fluconazole . Will need to determine duration of tx. Resolved on last CT. Repeat CT today. IR drain may be able to come out pending results.  Pancreatic leak - Cont surgical and IR drains. Drain output overall stable Delayed gastric emptying. Qtc ok.  Continue reglan . TPN. Failed clamping trial earlier this week. Cont NGT to LIWS.  HOCM - continue beta blockade and treat hypertension. IV metoprolol  to q4h instead of q6 per Cardiology.   Anemia of chronic disease, chronic blood loss anemia, and acute blood loss anemia- S/p 1U PRBC 12/27. Hgb stable at 9.2. On ttherapeutic lovoenox. HDS. Hyperglycemia  - improving, monitor   Hypophosphatemia - resolved Hypokalemia - resolved. Pharmacy monitoring.  Severe protein calorie malnutrition - TNA. RUE DVT - treatment dose lovenox , reviewed with vascular and will continue PICC for now OOB, IS, PT consult.  Thrush - nystatin  suspension  FEN - TPN. Cont PPI BID & Carafate  via tube. NGT to LIWS VTE - lovenox  treatment dose - cont. Keep picc.   ID - Fluconazole ,    LOS: 30 days   Christine Morse, [facility] 12/23/2023, 9:42 AM Please see Amion for pager number during day hours 7:00am-4:30pm

## 2023-12-24 ENCOUNTER — Other Ambulatory Visit: Payer: Self-pay

## 2023-12-24 ENCOUNTER — Inpatient Hospital Stay (HOSPITAL_COMMUNITY): Payer: Medicare Other

## 2023-12-24 ENCOUNTER — Inpatient Hospital Stay: Payer: Medicare Other

## 2023-12-24 ENCOUNTER — Inpatient Hospital Stay: Payer: Medicare Other | Admitting: Hematology

## 2023-12-24 HISTORY — PX: IR US GUIDE BX ASP/DRAIN: IMG2392

## 2023-12-24 LAB — CBC
HCT: 27.8 % — ABNORMAL LOW (ref 36.0–46.0)
Hemoglobin: 8.9 g/dL — ABNORMAL LOW (ref 12.0–15.0)
MCH: 29.2 pg (ref 26.0–34.0)
MCHC: 32 g/dL (ref 30.0–36.0)
MCV: 91.1 fL (ref 80.0–100.0)
Platelets: 520 10*3/uL — ABNORMAL HIGH (ref 150–400)
RBC: 3.05 MIL/uL — ABNORMAL LOW (ref 3.87–5.11)
RDW: 16.3 % — ABNORMAL HIGH (ref 11.5–15.5)
WBC: 15.3 10*3/uL — ABNORMAL HIGH (ref 4.0–10.5)
nRBC: 0 % (ref 0.0–0.2)

## 2023-12-24 LAB — BASIC METABOLIC PANEL
Anion gap: 9 (ref 5–15)
BUN: 19 mg/dL (ref 8–23)
CO2: 18 mmol/L — ABNORMAL LOW (ref 22–32)
Calcium: 9 mg/dL (ref 8.9–10.3)
Chloride: 105 mmol/L (ref 98–111)
Creatinine, Ser: 0.41 mg/dL — ABNORMAL LOW (ref 0.44–1.00)
GFR, Estimated: >60 mL/min (ref >60)
Glucose, Bld: 137 mg/dL — ABNORMAL HIGH (ref 70–99)
Potassium: 3.8 mmol/L (ref 3.5–5.1)
Sodium: 132 mmol/L — ABNORMAL LOW (ref 135–145)

## 2023-12-24 LAB — MAGNESIUM: Magnesium: 1.7 mg/dL (ref 1.7–2.4)

## 2023-12-24 LAB — PHOSPHORUS: Phosphorus: 2.7 mg/dL (ref 2.5–4.6)

## 2023-12-24 MED ORDER — LIDOCAINE HCL 1 % IJ SOLN
INTRAMUSCULAR | Status: AC
  Filled 2023-12-24: qty 20

## 2023-12-24 MED ORDER — TRAVASOL 10 % IV SOLN
INTRAVENOUS | Status: AC
  Filled 2023-12-24: qty 650.1

## 2023-12-24 MED ORDER — PIPERACILLIN-TAZOBACTAM 3.375 G IVPB
3.3750 g | Freq: Three times a day (TID) | INTRAVENOUS | Status: DC
  Administered 2023-12-24 – 2023-12-29 (×16): 3.375 g via INTRAVENOUS
  Filled 2023-12-24 (×15): qty 50

## 2023-12-24 NOTE — Progress Notes (Signed)
 88 y.o. female inpatient. Duodenal adenocarcinoma  S/p Whipple on 12.4.24. we placed a drain on 12.13 o a subscaputal fluid collecion anterior to the left lobe of the liver.  Ongoing abdominal pain. WBC is  uptrending. 15.3 < 12.6, 9.7. afebrile.  Case reviewed by IR Attending Dr. DOROTHA Bellman who recommends drain removal at this time. Drain removed at bedside in IR HR.  Drain removed intact, no complications,  patient tolerated procedure well, dressing applied to exit site.  Post-removal instructions: - Okay  to shower/sponge bath 24 hours post-removal. - No submerging (swimming, bathing) for 7 days post-removal. - Keep the dressing/bandage on to take shower, take dressing/bandage off and pat dry  the area completely before placing a new dressing/bandage  - Look for signs and symptoms of infection such as reddening of skin, pus like drainage,     fever and/or chills - Change dressing PRN until site fully healed.

## 2023-12-24 NOTE — Progress Notes (Signed)
 PHARMACY - TOTAL PARENTERAL NUTRITION CONSULT NOTE  Indication:  delayed gastric emptying  Patient Measurements: Height: 4' 10 (147.3 cm) Weight: 50.2 kg (110 lb 9.6 oz) IBW/kg (Calculated) : 40.9 TPN AdjBW (KG): 44.5 Body mass index is 23.12 kg/m. 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  Patient was started on a CLD on 12/6-12/9 and then advanced to a soft diet since 12/10.  Patient has had minimal PO intake since being post-op and continues to experience nausea. Delayed gastric emptying likely related to possible pancreatic leak.  Pharmacy consulted to dose TPN.  Glucose / Insulin : A1c 3.7 (11/15/23) - glucoses < 180. SSI D/C'ed 12/08/23. Electrolytes: Na: 132, K: 3.8, HCO3: 18, CoCa: 10.4, phos: 2.7 , mag: 1.7  Renal: SCr < 1, BUN WNL Hepatic: 1/2: LFTs / tbili WNL, Alk phos 128, albumin  2.3 Intake / Output; MIVF: UOP not charted (7x occurrence), NGT back to suction -1300, drain 159 mL, LBM 12/29 (s/p Reglan  x3 doses 12/11-12/12, restarted 12/15) GI Imaging: 12/07 Ab XR: unremarkable bowel gas pattern. 12/11 CT: possible post-op seroma or hematoma vs abscess, distal colonic diverticulosis 12/15 Ab XR: expected post-op changes, NGT in correct place 12/26- CT ABD- perc drain cath adj left lobe liver- no discernable residual fluid collection. Post whipple w/ no evidence pancreatic ductal dilatation or peripancreatic fluid collection, small amount of free fluid in pelvis 1/2 CT ABD - minimal fluid near drain in abdomen  GI Surgeries / Procedures:  12/3 diagnostic laparoscopy, Whipple procedure, pancreatic duct stent 12/13 subhepatic fluid collection aspiration and drainage  Central access: PICC placed 12/01/23 TPN start date: 12/02/23  Nutritional Goals: Goal TPN rate is 43mL/hr providing 1400kcal and 65g protein meeting 100% of  nutritional needs.   RD Estimated Needs Total Energy Estimated Needs: 1400-1700 kcal/d Total Protein Estimated Needs: 65-80g/d Total Fluid Estimated Needs: 1.5-1.7L/d  Current Nutrition:  TPN NPO   Plan:  Change from Clinimix  to compounded TPN, start at goal rate of 45mL/hr providing 1400kcal and 65g protein meeting 100% of nutritional needs.  Electrolytes in TPN: Na 62mEq/L, K 54mEq/L ( total from ), remove Ca 89mEq/L, Mg 85mEq/L, and Phos 15mmol/L. Cl:Ac, max acetate.  Electrolytes approximately equal to previous amount in Clinimix .  Add BMP tomorrow with above changes Monitor TPN labs on Mon/Thurs Ability to advance diet as able.   Powell Blush, PharmD, BCCCP  Clinical Pharmacist 12/24/2023 9:27 AM

## 2023-12-24 NOTE — Progress Notes (Signed)
 31 Days Post-Op   Subjective/Chief Complaint: Ongoing abdominal pain, temporarily releived by pain meds. Reports flatus but not BM sine 12/26. NG in place to LIWS.  Tmax 102.6 yesterday at 1625. WBC 15.3 from 12.6  Objective: Vital signs in last 24 hours: Temp:  [97.7 F (36.5 C)-102.6 F (39.2 C)] 99.4 F (37.4 C) (01/03 0752) Pulse Rate:  [85-116] 99 (01/03 0752) Resp:  [17-20] 20 (01/03 0752) BP: (118-153)/(46-60) 136/46 (01/03 0752) SpO2:  [93 %-100 %] 94 % (01/03 0752) Weight:  [50.2 kg] 50.2 kg (01/03 0437) Last BM Date : 12/19/23  Intake/Output from previous day: 01/02 0701 - 01/03 0700 In: 2485.1 [I.V.:1180.1; NG/GT:1100; IV Piggyback:200] Out: 1459 [Emesis/NG output:1300; Drains:159] Intake/Output this shift: No intake/output data recorded.  Physical Exam:  General appearance: sitting up in chair. alert, cooperative. MAE's Resp: breathing comfortably GI: soft, non distended. Minimal generalized ttp. No rigidity or guarding. +BS. Midline wound with staples out. There is erythema and induration of the upper abdomen (see photo)- no fluctuance or drainage. NGT with bilious fluid in cannister, no blood. Currently clamped             D1 - LLQ - 85cc -purulent appearing             D2 RLQ - 70cc (from 120cc yesterday ) - white/yellow, purulent appearing             D3 R abd IR drain - 4cc (from 3cc yesterday) - serous  Extremities: No LE edema  Lab Results:  Recent Labs    12/23/23 0616 12/24/23 0632  WBC 12.6* 15.3*  HGB 9.2* 8.9*  HCT 29.0* 27.8*  PLT 506* 520*    BMET Recent Labs    12/23/23 0616 12/24/23 0632  NA 136 132*  K 3.6 3.8  CL 110 105  CO2 20* 18*  GLUCOSE 137* 137*  BUN 19 19  CREATININE 0.41* 0.41*  CALCIUM  8.9 9.0   PT/INR No results for input(s): LABPROT, INR in the last 72 hours.   ABG No results for input(s): PHART, HCO3 in the last 72 hours.  Invalid input(s): PCO2,  PO2   Studies/Results:  Anti-infectives: Anti-infectives (From admission, onward)    Start     Dose/Rate Route Frequency Ordered Stop   12/24/23 1000  piperacillin -tazobactam (ZOSYN ) IVPB 3.375 g        3.375 g 12.5 mL/hr over 240 Minutes Intravenous Every 8 hours 12/24/23 0913     12/13/23 1700  piperacillin -tazobactam (ZOSYN ) IVPB 3.375 g        3.375 g 12.5 mL/hr over 240 Minutes Intravenous Every 8 hours 12/13/23 1412 12/13/23 2139   11/30/23 1230  fluconazole  (DIFLUCAN ) IVPB 400 mg        400 mg 100 mL/hr over 120 Minutes Intravenous Every 24 hours 11/30/23 1135     11/26/23 0945  piperacillin -tazobactam (ZOSYN ) IVPB 3.375 g  Status:  Discontinued        3.375 g 12.5 mL/hr over 240 Minutes Intravenous Every 8 hours 11/26/23 0853 12/13/23 1412   11/23/23 2100  ceFAZolin  (ANCEF ) IVPB 2g/100 mL premix        2 g 200 mL/hr over 30 Minutes Intravenous Every 8 hours 11/23/23 1654 11/23/23 2214   11/23/23 0700  ceFAZolin  (ANCEF ) IVPB 2g/100 mL premix        2 g 200 mL/hr over 30 Minutes Intravenous On call to O.R. 11/23/23 0654 11/23/23 1249       Assessment/Plan: Christine Morse is an 88 year  old female status post Whipple Procedure on 11/23/2023, (POD 31).  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 Rare candida seen on final culture. Completed Zosyn . On fluconazole . Will need to determine duration of tx. Resolved on last CT. Repeat CT 1/2 w/ decrease/resolution in fluid collections - IR drain can probably be removed. There is now cellulitis of the abdominal wall and there is fluid in the abdominal wall on CT - concerning for abscess. Will ask IR to aspiration/drain. Re-start zosyn . Pancreatic leak - Cont surgical and IR drains. Drain output overall stable Delayed gastric emptying. Qtc ok.  Continue reglan . TPN. Failed clamping  trial earlier this week. Cont NGT to LIWS.  HOCM - continue beta blockade and treat hypertension. IV metoprolol  to q4h instead of q6 per Cardiology.   Anemia of chronic disease, chronic blood loss anemia, and acute blood loss anemia- S/p 1U PRBC 12/27. Hgb stable at 8.9. On therapeutic lovoenox. HDS. Hyperglycemia - improving, monitor   Hypophosphatemia - resolved Hypokalemia - resolved. Pharmacy monitoring.  Severe protein calorie malnutrition - TNA. RUE DVT - treatment dose lovenox , reviewed with vascular and will continue PICC for now OOB, IS, PT consult.  Thrush - nystatin  suspension  FEN - TPN. Cont PPI BID & Carafate  via tube. NGT to LIWS VTE - lovenox  treatment dose - cont. Keep picc.   ID - Fluconazole , resume Zosyn  1/3 >>   LOS: 31 days   Christine Morse, [facility] 12/24/2023, 9:15 AM Please see Amion for pager number during day hours 7:00am-4:30pm

## 2023-12-24 NOTE — Progress Notes (Signed)
 Pt off unit to IR. NG clamped. Dionne Bucy RN

## 2023-12-24 NOTE — Procedures (Signed)
 Interventional Radiology Procedure Note  Procedure: Image guided aspiration of superficial abdominal wall fluid. ~30-40 cc of yellow, murky fluid aspirated for culture  Complications: None  EBL: None Sample: Culture sent  Recommendations:   - follow up Cx - routine wound care  Signed,  Ami RAMAN. Alona, DO

## 2023-12-24 NOTE — Progress Notes (Signed)
 Physical Therapy Treatment Patient Details Name: Christine Morse MRN: [id number] DOB: [birth year] Today's Date: 12/24/2023   History of Present Illness Patient is an 88 y/o female admitted 11/23/23 with diagnosis of duodenal adenoma and underwent laparoscopic Whipple procedure. post-op subcapsular fluid collection around the anterior aspect of the left liver lobe. Drain placement in IR 12/03/23. NGT inserted 12/15, dislodged then replaced.  Positive for R subclavian and axillary DVT on 12/07/23. 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    PT Comments  Progressing well.  Emphasis on sit to stand technique and safe as well as progression of gait stability/speed.     If plan is discharge home, recommend the following: A little help with walking and/or transfers;A little help with bathing/dressing/bathroom;Assistance with cooking/housework;Assist for transportation   Can travel by private vehicle     Yes  Equipment Recommendations  Rolling walker (2 wheels);BSC/3in1    Recommendations for Other Services       Precautions / Restrictions Precautions Precautions: Fall Precaution Comments: x2 blake drains, x1 JP drain, NG tube     Mobility  Bed Mobility               General bed mobility comments: Pt in recliner.    Transfers Overall transfer level: Needs assistance Equipment used: Rolling walker (2 wheels) Transfers: Sit to/from Stand Sit to Stand: Min assist           General transfer comment: pt uses UE's appropropriately to stand,   minimal assist forward and for boost.'    Ambulation/Gait Ambulation/Gait assistance: Min assist Gait Distance (Feet): 200 Feet Assistive device: Rolling walker (2 wheels) Gait Pattern/deviations: Step-through pattern   Gait velocity interpretation: <1.31 ft/sec, indicative of household ambulator   General Gait Details: generally steady.  short steps, chronically flexed posture, light use of  RW.   Stairs             Wheelchair Mobility     Tilt Bed    Modified Rankin (Stroke Patients Only)       Balance Overall balance assessment: Needs assistance Sitting-balance support: Feet supported, No upper extremity supported Sitting balance-[name]: Good Sitting balance - Comments: Edge of Chair   Standing balance support: Bilateral upper extremity supported, During functional activity, No upper extremity supported Standing balance-[name]: Fair Standing balance comment: preferring AD for support.                            Cognition Arousal: Alert Behavior During Therapy: Flat affect Overall Cognitive Status:  (NT formally)                                          Exercises      General Comments General comments (skin integrity, edema, etc.): vss      Pertinent Vitals/Pain Pain Assessment Pain Assessment: Faces Faces Pain Scale: Hurts a little bit Pain Location: abdomen Pain Descriptors / Indicators: Discomfort, Guarding Pain Intervention(s): Monitored during session    Home Living                          Prior Function            PT Goals (current goals can now be found in the care plan section) Acute Rehab PT Goals Patient Stated Goal:  return to independent PT Goal Formulation: With patient Time For Goal Achievement: 01/04/24 Potential to Achieve Goals: Fair Progress towards PT goals: Progressing toward goals    Frequency    Min 1X/week      PT Plan      Co-evaluation              AM-PAC PT 6 Clicks Mobility   Outcome Measure  Help needed turning from your back to your side while in a flat bed without using bedrails?: A Little Help needed moving from lying on your back to sitting on the side of a flat bed without using bedrails?: A Little Help needed moving to and from a bed to a chair (including a wheelchair)?: A Little Help needed standing up from a chair using your  arms (e.g., wheelchair or bedside chair)?: A Little Help needed to walk in hospital room?: A Little Help needed climbing 3-5 steps with a railing? : A Lot 6 Click Score: 17    End of Session   Activity Tolerance: Patient tolerated treatment well Patient left: in chair;with call bell/phone within reach Nurse Communication: Mobility status PT Visit Diagnosis: Other abnormalities of gait and mobility (R26.89);Pain Pain - part of body:  (abdomen)     Time: 8259-8247 PT Time Calculation (min) (ACUTE ONLY): 12 min  Charges:    $Gait Training: 8-22 mins PT General Charges $$ ACUTE PT VISIT: 1 Visit                     12/24/2023  [place] HERO., PT Acute Rehabilitation Services [phone]  (office)   Vinie GAILS Rondi Ivy 12/24/2023, 7:04 PM

## 2023-12-24 NOTE — Progress Notes (Addendum)
 Nutrition Follow-up  DOCUMENTATION CODES:   Severe malnutrition in context of acute illness/injury  INTERVENTION:   - Diet advancement per MD  - TPN per pharmacy (Pharmacy to add MVI to TPN)  *Updated energy needs below  NUTRITION DIAGNOSIS:   Severe Malnutrition (in the context of acute illness) related to  (inadequate energy intake) as evidenced by energy intake < or equal to 50% for > or equal to 5 days, moderate fat depletion, moderate muscle depletion.  - ongoing   GOAL:   Patient will meet greater than or equal to 90% of their needs  - Now meeting with compounded TPN  MONITOR:   PO intake, Supplement acceptance, Labs, Weight trends  REASON FOR ASSESSMENT:   Consult Assessment of nutrition requirement/status  ASSESSMENT:   Pt with hx of HTN, HLD, GERD, and diverticulosis presented for planned surgery after a large duodenal mass was seen on imaging at admission in November and was presumed to be malignant. Underwent pancreaticoduodenectomy and placement of pancreatic duct stent and confirmed tohave adenocarcinoma duodenum.  12/3 - Op, whipple procedure and placement of pancreatic duct stent.  12/10 - Soft diet  12/11 CT: possible post-op seroma or hematoma vs abscess, distal colonic diverticulosis  12/12 - TPN started,  12/13 - CT aspiration and drain placement, NPO 12/15 - NG inserted for persistent emesis  12/18- venous duplex positive for VTE; started on treatment dose lovenox  12/20- NGT clamping trials 12/21- NGT d/c, advanced to clear liquids 12/22- advanced to full liquids 12/23 - clear liquids 12/24 - NPO  TPN continuing, pt still NPO. Pt with possible pancreatic leak, still having delayed gastric emptying, on Reglan  Q8. NGT still in place with LIWS. Having abdominal pain. MD allowing sips of clears.  Last BM was 12/26.   Per Pharmacy, transitioning from Clinimix  to compounded TPN, starting at goal rate of 55 ml/hr providing 1400kcal and 65g protein  meeting 100% of nutritional needs. Pt losing weight and NFPE showed worsening muscle and fat loss notified pharmacy of increased energy needs for tomorrow.   Admit weight: 55.3 kg - stated?  Current weight: 50.2 kg   Last Weight  Most recent update: 12/24/2023  4:37 AM    Weight  50.2 kg (110 lb 9.6 oz)             Intake/Output Summary (Last 24 hours) at 12/24/2023 1205 Last data filed at 12/24/2023 0552 Gross per 24 hour  Intake 2485.1 ml  Output 1459 ml  Net 1026.1 ml   Net IO Since Admission: 8,992.22 mL [12/24/23 1205]  Drains/Lines: NG: 1300 ml x 24 hours Closed system drain 1 L LLQ: 85 ml x 24 hours Closed system drain 1 R RLQ: 80 ml x 24 hours  R Abdomen bulb drain: 4 ml x 24 hours   Average Meal Intake: NPO  Nutritionally Relevant Medications: Scheduled Meds:  Chlorhexidine  Gluconate Cloth  6 each Topical Q0600   enoxaparin  (LOVENOX ) injection  50 mg Subcutaneous Q12H   metoCLOPramide  (REGLAN ) injection  5 mg Intravenous Q8H   metoprolol  tartrate  7.5 mg Intravenous 6 X Daily   nystatin   5 mL Oral QID   pantoprazole  (PROTONIX ) IV  40 mg Intravenous Q12H   sodium chloride  flush  10-40 mL Intracatheter Q12H   sodium chloride  flush  5 mL Intracatheter Q8H   sucralfate   1 g Per Tube TID   Continuous Infusions:  fluconazole  (DIFLUCAN ) IV 400 mg (12/24/23 0913)   piperacillin -tazobactam (ZOSYN )  IV 3.375 g (12/24/23 1036)  TPN (CLINIMIX ) Adult with Electrolyte Additives 42 mL/hr (12/23/23 1739)   TPN ADULT (ION)      Labs Reviewed: Sodium 132, Creatine 0.41,   NUTRITION - FOCUSED PHYSICAL EXAM:  Flowsheet Row Most Recent Value  Orbital Region Severe depletion  Upper Arm Region Severe depletion  Thoracic and Lumbar Region Severe depletion  Buccal Region Severe depletion  Temple Region Severe depletion  Clavicle Bone Region Severe depletion  Clavicle and Acromion Bone Region Severe depletion  Scapular Bone Region Severe depletion  Dorsal Hand Severe  depletion  Patellar Region Severe depletion  Anterior Thigh Region Severe depletion  Posterior Calf Region Severe depletion  Edema (RD Assessment) None  Hair Reviewed  Eyes Reviewed  Mouth Reviewed  Skin Reviewed  Nails Reviewed       Diet Order:   Diet Order             Diet NPO time specified Except for: Ice Chips, Other (See Comments)  Diet effective now                   EDUCATION NEEDS:   Education needs have been addressed  Skin:  Skin Assessment: Skin Integrity Issues: Skin Integrity Issues:: Incisions Incisions: Abdomen  Last BM:  12/26  Height:   Ht Readings from Last 1 Encounters:  11/23/23 4' 10 (1.473 m)    Weight:   Wt Readings from Last 1 Encounters:  12/24/23 50.2 kg    Ideal Body Weight:  43.9 kg  BMI:  Body mass index is 23.12 kg/m.  Estimated Nutritional Needs:   Kcal:  1500-1700 kcal  Protein:  75-85 gm  Fluid:  1 ml/kcal   Olivia Kenning, RD Registered Dietitian  See Amion for more information

## 2023-12-24 NOTE — Progress Notes (Signed)
 Pt back to the unit from IR. Pt alert and verbally responsive, grandson at the bedside. VSS; JP drain out with clean, dry and intact gauze dsg to the abd site. R & L abd drains remain intact with unremarkable dressings. MYRTIS Gong Edrian Melucci RN   12/24/23 1526  Vitals  Temp 98.9 F (37.2 C)  Temp Source Axillary  BP (!) 136/46  MAP (mmHg) 71  BP Location Left Arm  BP Method Automatic  Patient Position (if appropriate) Lying  Pulse Rate 70  Pulse Rate Source Monitor  ECG Heart Rate 70  Resp 16  MEWS COLOR  MEWS Score Color Green  Oxygen Therapy  SpO2 96 %  O2 Device Room Air  Pain Assessment  Pain Scale 0-10  Pain Score 0  MEWS Score  MEWS Temp 0  MEWS Systolic 0  MEWS Pulse 0  MEWS RR 0  MEWS LOC 0  MEWS Score 0

## 2023-12-25 LAB — BASIC METABOLIC PANEL
Anion gap: 9 (ref 5–15)
BUN: 30 mg/dL — ABNORMAL HIGH (ref 8–23)
CO2: 18 mmol/L — ABNORMAL LOW (ref 22–32)
Calcium: 8.8 mg/dL — ABNORMAL LOW (ref 8.9–10.3)
Chloride: 107 mmol/L (ref 98–111)
Creatinine, Ser: 0.49 mg/dL (ref 0.44–1.00)
GFR, Estimated: >60 mL/min (ref >60)
Glucose, Bld: 167 mg/dL — ABNORMAL HIGH (ref 70–99)
Potassium: 3.9 mmol/L (ref 3.5–5.1)
Sodium: 134 mmol/L — ABNORMAL LOW (ref 135–145)

## 2023-12-25 LAB — GLUCOSE, CAPILLARY
Glucose-Capillary: 150 mg/dL — ABNORMAL HIGH (ref 70–99)
Glucose-Capillary: 157 mg/dL — ABNORMAL HIGH (ref 70–99)

## 2023-12-25 LAB — CBC
HCT: 27.7 % — ABNORMAL LOW (ref 36.0–46.0)
Hemoglobin: 8.7 g/dL — ABNORMAL LOW (ref 12.0–15.0)
MCH: 28.7 pg (ref 26.0–34.0)
MCHC: 31.4 g/dL (ref 30.0–36.0)
MCV: 91.4 fL (ref 80.0–100.0)
Platelets: 448 10*3/uL — ABNORMAL HIGH (ref 150–400)
RBC: 3.03 MIL/uL — ABNORMAL LOW (ref 3.87–5.11)
RDW: 16.3 % — ABNORMAL HIGH (ref 11.5–15.5)
WBC: 13.8 10*3/uL — ABNORMAL HIGH (ref 4.0–10.5)
nRBC: 0 % (ref 0.0–0.2)

## 2023-12-25 LAB — MAGNESIUM: Magnesium: 1.8 mg/dL (ref 1.7–2.4)

## 2023-12-25 LAB — PHOSPHORUS: Phosphorus: 3.1 mg/dL (ref 2.5–4.6)

## 2023-12-25 MED ORDER — LIDOCAINE HCL 1 % IJ SOLN
20.0000 mL | Freq: Once | INTRAMUSCULAR | Status: AC
  Administered 2023-12-25: 20 mL
  Filled 2023-12-25: qty 20

## 2023-12-25 MED ORDER — TRAVASOL 10 % IV SOLN
INTRAVENOUS | Status: AC
  Filled 2023-12-25: qty 750

## 2023-12-25 NOTE — Procedures (Signed)
 Incision and Drainage  Date/Time: 12/25/2023 12:25 PM  Performed by: Polly Cordella LABOR, MD Authorized by: Polly Cordella LABOR, MD   Consent:    Consent obtained:  Written   Consent given by:  Patient   Risks, benefits, and alternatives were discussed: yes     Risks discussed:  Incomplete drainage, bleeding and pain   Alternatives discussed:  No treatment Universal protocol:    Procedure explained and questions answered to patient or proxy's satisfaction: yes     Relevant documents present and verified: yes     Test results available : yes     Imaging studies available: yes     Required blood products, implants, devices, and special equipment available: no     Site/side marked: yes     Immediately prior to procedure, a time out was called: yes     Patient identity confirmed:  Verbally with patient and arm band Location:    Type:  Abscess   Location:  Trunk   Trunk location:  Abdomen Pre-procedure details:    Skin preparation:  Chlorhexidine  with alcohol  Sedation:    Sedation type:  None Anesthesia:    Anesthesia method:  Topical application and local infiltration   Local anesthetic:  Lidocaine  1% WITH epi Procedure type:    Complexity:  Simple Procedure details:    Ultrasound guidance: no     Needle aspiration: no     Incision types:  Single straight   Incision depth:  Dermal   Wound management:  Probed and deloculated and irrigated with saline   Drainage:  Purulent   Drainage amount:  Moderate   Wound treatment:  Wound left open   Packing materials:  1/2 in gauze Post-procedure details:    Procedure completion:  Tolerated Comments:     An area just above the umbilicus had dehisced and had purulent drainage. The opening was extended several centimeters cephalad and he wound probed. There was a moderate amount of purulent drainage. The wound was irrigated and packed with Kerlix.

## 2023-12-25 NOTE — Plan of Care (Signed)
  Problem: Education: Goal: Knowledge of General Education information will improve Description: Including pain rating scale, medication(s)/side effects and non-pharmacologic comfort measures Outcome: Progressing   Problem: Health Behavior/Discharge Planning: Goal: Ability to manage health-related needs will improve Outcome: Progressing   Problem: Clinical Measurements: Goal: Ability to maintain clinical measurements within normal limits will improve Outcome: Progressing Goal: Will remain free from infection Outcome: Progressing Goal: Diagnostic test results will improve Outcome: Progressing Goal: Cardiovascular complication will be avoided Outcome: Progressing   Problem: Activity: Goal: Risk for activity intolerance will decrease Outcome: Progressing   Problem: Nutrition: Goal: Adequate nutrition will be maintained Outcome: Progressing   Problem: Coping: Goal: Level of anxiety will decrease Outcome: Progressing   Problem: Elimination: Goal: Will not experience complications related to bowel motility Outcome: Progressing   Problem: Pain Management: Goal: General experience of comfort will improve Outcome: Progressing   Problem: Safety: Goal: Ability to remain free from injury will improve Outcome: Progressing   Problem: Skin Integrity: Goal: Risk for impaired skin integrity will decrease Outcome: Progressing   Problem: Education: Goal: Ability to describe self-care measures that may prevent or decrease complications (Diabetes Survival Skills Education) will improve Outcome: Progressing Goal: Individualized Educational Video(s) Outcome: Progressing   Problem: Coping: Goal: Ability to adjust to condition or change in health will improve Outcome: Progressing   Problem: Fluid Volume: Goal: Ability to maintain a balanced intake and output will improve Outcome: Progressing   Problem: Health Behavior/Discharge Planning: Goal: Ability to identify and utilize  available resources and services will improve Outcome: Progressing Goal: Ability to manage health-related needs will improve Outcome: Progressing   Problem: Metabolic: Goal: Ability to maintain appropriate glucose levels will improve Outcome: Progressing   Problem: Nutritional: Goal: Maintenance of adequate nutrition will improve Outcome: Progressing Goal: Progress toward achieving an optimal weight will improve Outcome: Progressing   Problem: Skin Integrity: Goal: Risk for impaired skin integrity will decrease Outcome: Progressing   Problem: Education: Goal: Required Educational Video(s) Outcome: Progressing   Problem: Clinical Measurements: Goal: Ability to maintain clinical measurements within normal limits will improve Outcome: Progressing Goal: Postoperative complications will be avoided or minimized Outcome: Progressing   Problem: Skin Integrity: Goal: Demonstration of wound healing without infection will improve Outcome: Progressing

## 2023-12-25 NOTE — Progress Notes (Addendum)
 32 Days Post-Op   Subjective/Chief Complaint: Underwent aspiration of abdominal wall fluid collection but then began draining purulence from her midline.  WBC down and has been AF.    On exam, patient resting comfortably in chair. Denies new complaints.   Objective: Vital signs in last 24 hours: Temp:  [97.6 F (36.4 C)-99.4 F (37.4 C)] 98.5 F (36.9 C) (01/04 0326) Pulse Rate:  [70-99] 78 (01/04 0326) Resp:  [16-20] 18 (01/04 0326) BP: (109-136)/(46-65) 109/46 (01/04 0326) SpO2:  [94 %-99 %] 98 % (01/04 0326) Weight:  [50.2 kg] 50.2 kg (01/04 0431) Last BM Date : 12/16/23  Intake/Output from previous day: 01/03 0701 - 01/04 0700 In: 2235.1 [I.V.:1185.1; IV Piggyback:1050] Out: 1130 [Emesis/NG output:950; Drains:180] Intake/Output this shift: No intake/output data recorded.  Physical Exam:  General appearance: sitting up in chair. alert, cooperative. MAE's Resp: breathing comfortably GI: soft, non distended. Minimal generalized ttp. No rigidity or guarding. +BS. Midline wound with staples out. Erythema adjacent to her incision with a 1-cm opening with purulent drainage             D1 - LLQ - 130cc -purulent appearing             D2 RLQ - 50cc - white/yellow, purulent appearing             Extremities: No LE edema  Lab Results:  Recent Labs    12/24/23 0632 12/25/23 0433  WBC 15.3* 13.8*  HGB 8.9* 8.7*  HCT 27.8* 27.7*  PLT 520* 448*    BMET Recent Labs    12/24/23 0632 12/25/23 0433  NA 132* 134*  K 3.8 3.9  CL 105 107  CO2 18* 18*  GLUCOSE 137* 167*  BUN 19 30*  CREATININE 0.41* 0.49  CALCIUM  9.0 8.8*   PT/INR No results for input(s): LABPROT, INR in the last 72 hours.   ABG No results for input(s): PHART, HCO3 in the last 72 hours.  Invalid input(s): PCO2, PO2   Studies/Results:  Anti-infectives: Anti-infectives (From admission, onward)    Start     Dose/Rate Route Frequency Ordered Stop   12/24/23 1000   piperacillin -tazobactam (ZOSYN ) IVPB 3.375 g        3.375 g 12.5 mL/hr over 240 Minutes Intravenous Every 8 hours 12/24/23 0913     12/13/23 1700  piperacillin -tazobactam (ZOSYN ) IVPB 3.375 g        3.375 g 12.5 mL/hr over 240 Minutes Intravenous Every 8 hours 12/13/23 1412 12/13/23 2139   11/30/23 1230  fluconazole  (DIFLUCAN ) IVPB 400 mg        400 mg 100 mL/hr over 120 Minutes Intravenous Every 24 hours 11/30/23 1135     11/26/23 0945  piperacillin -tazobactam (ZOSYN ) IVPB 3.375 g  Status:  Discontinued        3.375 g 12.5 mL/hr over 240 Minutes Intravenous Every 8 hours 11/26/23 0853 12/13/23 1412   11/23/23 2100  ceFAZolin  (ANCEF ) IVPB 2g/100 mL premix        2 g 200 mL/hr over 30 Minutes Intravenous Every 8 hours 11/23/23 1654 11/23/23 2214   11/23/23 0700  ceFAZolin  (ANCEF ) IVPB 2g/100 mL premix        2 g 200 mL/hr over 30 Minutes Intravenous On call to O.R. 11/23/23 0654 11/23/23 1249       Assessment/Plan: Christine Morse is an 88 year old female status post Whipple Procedure on 11/23/2023, (POD 32).  Final pathology - adenocarcinoma duodenum, positive margin, positive LN - pT3pN2cM0  Abdominal fluid  collection - CT aspiration and drain placement 12/13 (anterior hepatic fluid collection) - serosang. Rare candida seen on final culture. Completed Zosyn . On fluconazole . Repeat CT 1/2 with abdominal wall collection now s/p IR aspiration 1/3. Purulent drainage from the midline this morning. Will plan for bedside I&D to washout and pack the wound.  Pancreatic leak - Cont surgical and IR drains. Drain output overall stable Delayed gastric emptying. Qtc ok.  Continue reglan . TPN. Failed clamping trial earlier this week. Cont NGT to LIWS.  HOCM - continue beta blockade and treat hypertension. IV metoprolol  to q4h instead of q6 per Cardiology.   Anemia of chronic disease, chronic blood loss anemia, and acute blood loss anemia- S/p 1U PRBC 12/27. Hgb stable at 8.7. On therapeutic lovoenox.  HDS. Hyperglycemia - improving, monitor   Hypophosphatemia - resolved Hypokalemia - resolved. Pharmacy monitoring.  Severe protein calorie malnutrition - TNA. RUE DVT - treatment dose lovenox , reviewed with vascular and will continue PICC for now OOB, IS, PT consult.  Thrush - nystatin  suspension  FEN - TPN. Cont PPI BID & Carafate  via tube. NGT to LIWS VTE - lovenox  treatment dose - cont. Keep picc.   ID - Fluconazole , resume Zosyn  1/3 >>   LOS: 32 days   Christine DELENA Idler, MD [facility] Surgery 12/25/2023, 7:46 AM Please see Amion for pager number during day hours 7:00am-4:30pm

## 2023-12-25 NOTE — Progress Notes (Signed)
 PHARMACY - TOTAL PARENTERAL NUTRITION CONSULT NOTE  Indication:  delayed gastric emptying  Patient Measurements: Height: 4' 10 (147.3 cm) Weight: 50.2 kg (110 lb 9.6 oz) IBW/kg (Calculated) : 40.9 TPN AdjBW (KG): 44.5 Body mass index is 23.12 kg/m. 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  Patient was started on a CLD on 12/6-12/9 and then advanced to a soft diet since 12/10.  Patient has had minimal PO intake since being post-op and continues to experience nausea. Delayed gastric emptying likely related to possible pancreatic leak.  Pharmacy consulted to dose TPN.  Glucose / Insulin : A1c 3.7 (11/15/23) - glucoses < 180, 1x elevation to 167. SSI D/C'ed 12/08/23. Electrolytes: Na: 134, K: 3.9, HCO3: 18, CoCa: 10.2, phos: 3.1 , mag: 1.8 Renal: SCr < 1, BUN WNL Hepatic: 1/2: LFTs / tbili WNL, Alk phos 128, albumin  2.3 Intake / Output; MIVF: UOP not charted (4x occurrence), NGT back to suction -950, drain 180 mL, LBM 12/29 (s/p Reglan  x3 doses 12/11-12/12, restarted 12/15) GI Imaging: 12/07 Ab XR: unremarkable bowel gas pattern. 12/11 CT: possible post-op seroma or hematoma vs abscess, distal colonic diverticulosis 12/15 Ab XR: expected post-op changes, NGT in correct place 12/26- CT ABD- perc drain cath adj left lobe liver- no discernable residual fluid collection. Post whipple w/ no evidence pancreatic ductal dilatation or peripancreatic fluid collection, small amount of free fluid in pelvis 1/2 CT ABD - minimal fluid near drain in abdomen  GI Surgeries / Procedures:  12/3 diagnostic laparoscopy, Whipple procedure, pancreatic duct stent 12/13 subhepatic fluid collection aspiration and drainage  Central access: PICC placed 12/01/23 TPN start date: 12/02/23  Nutritional Goals: Goal TPN rate is 23mL/hr providing 1400kcal and 65g protein  meeting 100% of nutritional needs.   RD Estimated Needs Total Energy Estimated Needs: 1500-1700 kcal Total Protein Estimated Needs: 75-85 gm Total Fluid Estimated Needs: 1 ml/kcal  Current Nutrition:  TPN NPO   Plan:  Adjust TPN goal rate to 45mL/hr providing 1500kcal and 75g protein meeting 100% of nutritional needs.  Electrolytes in TPN: Na 36mEq/L, K 36mEq/L, remove Ca 60mEq/L, Mg 65mEq/L, and Phos 15mmol/L. Cl:Ac, max acetate.  Electrolytes approximately equal to previous amount in TPN on 1/3. Will add Q8H CBG checks to ensure patient tolerates dextrose  load given 1x elevation to 167 while on compounded TPN.   Monitor TPN labs on Mon/Thurs Ability to advance diet as able.   Powell Blush, PharmD, BCCCP  Clinical Pharmacist 12/25/2023 7:00 AM

## 2023-12-26 LAB — BASIC METABOLIC PANEL
Anion gap: 10 (ref 5–15)
BUN: 25 mg/dL — ABNORMAL HIGH (ref 8–23)
CO2: 20 mmol/L — ABNORMAL LOW (ref 22–32)
Calcium: 8.6 mg/dL — ABNORMAL LOW (ref 8.9–10.3)
Chloride: 105 mmol/L (ref 98–111)
Creatinine, Ser: 0.42 mg/dL — ABNORMAL LOW (ref 0.44–1.00)
GFR, Estimated: >60 mL/min (ref >60)
Glucose, Bld: 158 mg/dL — ABNORMAL HIGH (ref 70–99)
Potassium: 3.4 mmol/L — ABNORMAL LOW (ref 3.5–5.1)
Sodium: 135 mmol/L (ref 135–145)

## 2023-12-26 LAB — CBC
HCT: 30.2 % — ABNORMAL LOW (ref 36.0–46.0)
Hemoglobin: 9.4 g/dL — ABNORMAL LOW (ref 12.0–15.0)
MCH: 28.4 pg (ref 26.0–34.0)
MCHC: 31.1 g/dL (ref 30.0–36.0)
MCV: 91.2 fL (ref 80.0–100.0)
Platelets: 518 10*3/uL — ABNORMAL HIGH (ref 150–400)
RBC: 3.31 MIL/uL — ABNORMAL LOW (ref 3.87–5.11)
RDW: 16.3 % — ABNORMAL HIGH (ref 11.5–15.5)
WBC: 8.9 10*3/uL (ref 4.0–10.5)
nRBC: 0 % (ref 0.0–0.2)

## 2023-12-26 LAB — GLUCOSE, CAPILLARY: Glucose-Capillary: 140 mg/dL — ABNORMAL HIGH (ref 70–99)

## 2023-12-26 MED ORDER — TRAVASOL 10 % IV SOLN
INTRAVENOUS | Status: AC
  Filled 2023-12-26: qty 750

## 2023-12-26 NOTE — Progress Notes (Signed)
 33 Days Post-Op   Subjective/Chief Complaint: Required I&D of her midline at bedside yesterday.  Denies new complaints today.    Objective: Vital signs in last 24 hours: Temp:  [97.6 F (36.4 C)-97.9 F (36.6 C)] 97.7 F (36.5 C) (01/05 0744) Pulse Rate:  [70-79] 77 (01/05 0300) Resp:  [16-18] 18 (01/05 0744) BP: (102-159)/(49-62) 159/54 (01/05 0300) SpO2:  [97 %-100 %] 99 % (01/05 0300) Weight:  [108.8 kg] 108.8 kg (01/05 0258) Last BM Date : 01/18/24  Intake/Output from previous day: 01/04 0701 - 01/05 0700 In: 1393.8 [I.V.:806.3; IV Piggyback:587.5] Out: 1043 [Emesis/NG output:900; Drains:143] Intake/Output this shift: No intake/output data recorded.  Physical Exam:  General appearance: sitting up in chair. alert, cooperative. MAE's Resp: breathing comfortably GI: soft, non distended. Minimal generalized ttp. No rigidity or guarding. +BS. Midline wound with staples out, dressing in place without staining             D1 - LLQ - 140cc -purulent appearing             D2 RLQ - 3cc - white/yellow, purulent appearing             Extremities: No LE edema  Lab Results:  Recent Labs    12/24/23 0632 12/25/23 0433  WBC 15.3* 13.8*  HGB 8.9* 8.7*  HCT 27.8* 27.7*  PLT 520* 448*    BMET Recent Labs    12/24/23 0632 12/25/23 0433  NA 132* 134*  K 3.8 3.9  CL 105 107  CO2 18* 18*  GLUCOSE 137* 167*  BUN 19 30*  CREATININE 0.41* 0.49  CALCIUM  9.0 8.8*   PT/INR No results for input(s): LABPROT, INR in the last 72 hours.   ABG No results for input(s): PHART, HCO3 in the last 72 hours.  Invalid input(s): PCO2, PO2   Studies/Results:  Anti-infectives: Anti-infectives (From admission, onward)    Start     Dose/Rate Route Frequency Ordered Stop   12/24/23 1000  piperacillin -tazobactam (ZOSYN ) IVPB 3.375 g        3.375 g 12.5 mL/hr over 240 Minutes Intravenous Every 8 hours 12/24/23 0913     12/13/23 1700  piperacillin -tazobactam (ZOSYN ) IVPB  3.375 g        3.375 g 12.5 mL/hr over 240 Minutes Intravenous Every 8 hours 12/13/23 1412 12/13/23 2139   11/30/23 1230  fluconazole  (DIFLUCAN ) IVPB 400 mg        400 mg 100 mL/hr over 120 Minutes Intravenous Every 24 hours 11/30/23 1135     11/26/23 0945  piperacillin -tazobactam (ZOSYN ) IVPB 3.375 g  Status:  Discontinued        3.375 g 12.5 mL/hr over 240 Minutes Intravenous Every 8 hours 11/26/23 0853 12/13/23 1412   11/23/23 2100  ceFAZolin  (ANCEF ) IVPB 2g/100 mL premix        2 g 200 mL/hr over 30 Minutes Intravenous Every 8 hours 11/23/23 1654 11/23/23 2214   11/23/23 0700  ceFAZolin  (ANCEF ) IVPB 2g/100 mL premix        2 g 200 mL/hr over 30 Minutes Intravenous On call to O.R. 11/23/23 0654 11/23/23 1249       Assessment/Plan: Ms. Mullen is an 88 year old female status post Whipple Procedure on 11/23/2023, (POD 33).  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 Rare candida seen on final culture. Completed Zosyn . On fluconazole . Repeat CT 1/2 with  abdominal wall collection now s/p IR aspiration 1/3 and bedside I&D 1/4.  Zosyn  restarted 1/3 -  Pancreatic leak - Cont surgical drains.  IR drain removed. Drain output overall stable Delayed gastric emptying. Qtc ok.  Continue reglan . TPN. Failed clamping trial earlier this week. Cont NGT to LIWS.  HOCM - continue beta blockade and treat hypertension. IV metoprolol  to q4h instead of q6 per Cardiology.   Anemia of chronic disease, chronic blood loss anemia, and acute blood loss anemia- S/p 1U PRBC 12/27. On therapeutic lovoenox. HDS. Hyperglycemia - improving, monitor   Hypophosphatemia - resolved Hypokalemia - resolved. Pharmacy monitoring.  Severe protein calorie malnutrition - TNA. RUE DVT - treatment dose lovenox , reviewed with vascular and will continue PICC for now OOB, IS,  PT consult.  Thrush - nystatin  suspension  FEN - TPN. Cont PPI BID & Carafate  via tube. NGT to LIWS VTE - lovenox  treatment dose - cont. Keep picc.   ID - Fluconazole , resume Zosyn  1/3 >>   LOS: 33 days   Cordella DELENA Idler, MD [facility] Surgery 12/26/2023, 9:57 AM Please see Amion for pager number during day hours 7:00am-4:30pm

## 2023-12-26 NOTE — Progress Notes (Addendum)
 PHARMACY - TOTAL PARENTERAL NUTRITION CONSULT NOTE  Indication:  delayed gastric emptying  Patient Measurements: Height: 4' 10 (147.3 cm) Weight: 108.8 kg (239 lb 13.8 oz) (bed weight) IBW/kg (Calculated) : 40.9 TPN AdjBW (KG): 44.5 Body mass index is 50.13 kg/m. 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  Patient was started on a CLD on 12/6-12/9 and then advanced to a soft diet since 12/10.  Patient has had minimal PO intake since being post-op and continues to experience nausea. Delayed gastric emptying likely related to possible pancreatic leak.  Pharmacy consulted to dose TPN.  Glucose / Insulin : A1c 3.7 (11/15/23) - glucose 137-167,. SSI D/C'ed 12/08/23, added CBG checks 1/4  Electrolytes: 1/4: Na: 134, K: 3.9, HCO3: 18, CoCa: 10.2, phos: 3.1 , mag: 1.8 Renal: 1/4: SCr < 1, BUN WNL Hepatic: 1/2: LFTs / tbili WNL, Alk phos 128, albumin  2.3 Intake / Output; MIVF: UOP not charted, NGT back to suction -900, drain 143 mL, LBM 12/29 (s/p Reglan  x3 doses 12/11-12/12, restarted 12/15) GI Imaging: 12/07 Ab XR: unremarkable bowel gas pattern. 12/11 CT: possible post-op seroma or hematoma vs abscess, distal colonic diverticulosis 12/15 Ab XR: expected post-op changes, NGT in correct place 12/26- CT ABD- perc drain cath adj left lobe liver- no discernable residual fluid collection. Post whipple w/ no evidence pancreatic ductal dilatation or peripancreatic fluid collection, small amount of free fluid in pelvis 1/2 CT ABD - minimal fluid near drain in abdomen  GI Surgeries / Procedures:  12/3 diagnostic laparoscopy, Whipple procedure, pancreatic duct stent 12/13 subhepatic fluid collection aspiration and drainage 1/3 IR US  drain placement of abdominal abscess   Central access: PICC placed 12/01/23 TPN start date: 12/02/23  Nutritional  Goals: Goal TPN rate is 68mL/hr providing 1400kcal and 65g protein meeting 100% of nutritional needs.   RD Estimated Needs Total Energy Estimated Needs: 1500-1700 kcal Total Protein Estimated Needs: 75-85 gm Total Fluid Estimated Needs: 1 ml/kcal  Current Nutrition:  TPN NPO   Plan:  Continue TPN at goal rate 65mL/hr providing 1500kcal and 75g protein meeting 100% of nutritional needs.  Electrolytes in TPN: Na 63mEq/L, K 65mEq/L, Ca 13mEq/L, Mg 76mEq/L, and Phos 15mmol/L. Cl:Ac, max acetate.  Trend CBG with checks for another 24h, consider d/c on 1/6 if table.  Monitor TPN labs on Mon/Thurs Ability to advance diet as able.   Powell Blush, PharmD, BCCCP  Clinical Pharmacist 12/26/2023 7:02 AM

## 2023-12-27 ENCOUNTER — Other Ambulatory Visit: Payer: Self-pay

## 2023-12-27 LAB — PHOSPHORUS: Phosphorus: 3 mg/dL (ref 2.5–4.6)

## 2023-12-27 LAB — COMPREHENSIVE METABOLIC PANEL
ALT: 21 U/L (ref 0–44)
AST: 20 U/L (ref 15–41)
Albumin: 2 g/dL — ABNORMAL LOW (ref 3.5–5.0)
Alkaline Phosphatase: 209 U/L — ABNORMAL HIGH (ref 38–126)
Anion gap: 9 (ref 5–15)
BUN: 17 mg/dL (ref 8–23)
CO2: 23 mmol/L (ref 22–32)
Calcium: 8.4 mg/dL — ABNORMAL LOW (ref 8.9–10.3)
Chloride: 105 mmol/L (ref 98–111)
Creatinine, Ser: 0.37 mg/dL — ABNORMAL LOW (ref 0.44–1.00)
GFR, Estimated: >60 mL/min (ref >60)
Glucose, Bld: 143 mg/dL — ABNORMAL HIGH (ref 70–99)
Potassium: 3.3 mmol/L — ABNORMAL LOW (ref 3.5–5.1)
Sodium: 137 mmol/L (ref 135–145)
Total Bilirubin: 0.3 mg/dL (ref 0.0–1.2)
Total Protein: 5.5 g/dL — ABNORMAL LOW (ref 6.5–8.1)

## 2023-12-27 LAB — CBC
HCT: 29.7 % — ABNORMAL LOW (ref 36.0–46.0)
Hemoglobin: 9.3 g/dL — ABNORMAL LOW (ref 12.0–15.0)
MCH: 28.6 pg (ref 26.0–34.0)
MCHC: 31.3 g/dL (ref 30.0–36.0)
MCV: 91.4 fL (ref 80.0–100.0)
Platelets: 519 10*3/uL — ABNORMAL HIGH (ref 150–400)
RBC: 3.25 MIL/uL — ABNORMAL LOW (ref 3.87–5.11)
RDW: 15.9 % — ABNORMAL HIGH (ref 11.5–15.5)
WBC: 8.6 10*3/uL (ref 4.0–10.5)
nRBC: 0 % (ref 0.0–0.2)

## 2023-12-27 LAB — AEROBIC/ANAEROBIC CULTURE W GRAM STAIN (SURGICAL/DEEP WOUND)

## 2023-12-27 LAB — TRIGLYCERIDES: Triglycerides: 91 mg/dL (ref <150)

## 2023-12-27 LAB — MAGNESIUM: Magnesium: 1.9 mg/dL (ref 1.7–2.4)

## 2023-12-27 LAB — GLUCOSE, CAPILLARY: Glucose-Capillary: 107 mg/dL — ABNORMAL HIGH (ref 70–99)

## 2023-12-27 MED ORDER — SODIUM CHLORIDE 0.9% FLUSH
10.0000 mL | Freq: Two times a day (BID) | INTRAVENOUS | Status: DC
  Administered 2023-12-27 – 2024-01-01 (×8): 10 mL
  Administered 2024-01-01: 40 mL
  Administered 2024-01-02: 10 mL
  Administered 2024-01-02: 40 mL
  Administered 2024-01-03: 10 mL
  Administered 2024-01-03: 30 mL
  Administered 2024-01-04 – 2024-01-05 (×4): 10 mL
  Administered 2024-01-06: 20 mL
  Administered 2024-01-06 – 2024-01-07 (×3): 10 mL
  Administered 2024-01-08: 40 mL
  Administered 2024-01-08: 10 mL

## 2023-12-27 MED ORDER — DEXTROSE 5 % IV SOLN: INTRAVENOUS | Status: AC

## 2023-12-27 MED ORDER — TRAVASOL 10 % IV SOLN
INTRAVENOUS | Status: AC
  Filled 2023-12-27: qty 750

## 2023-12-27 MED ORDER — POTASSIUM CHLORIDE 10 MEQ/50ML IV SOLN
10.0000 meq | INTRAVENOUS | Status: AC
  Administered 2023-12-27 (×3): 10 meq via INTRAVENOUS
  Filled 2023-12-27: qty 50

## 2023-12-27 MED ORDER — SODIUM CHLORIDE 0.9% FLUSH
10.0000 mL | INTRAVENOUS | Status: DC | PRN
  Administered 2023-12-30: 10 mL

## 2023-12-27 NOTE — Progress Notes (Signed)
 PHARMACY - TOTAL PARENTERAL NUTRITION CONSULT NOTE  Indication:  delayed gastric emptying  Patient Measurements: Height: 4' 10 (147.3 cm) Weight: 48.6 kg (107 lb 2.3 oz) IBW/kg (Calculated) : 40.9 TPN AdjBW (KG): 44.5 Body mass index is 22.39 kg/m. 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  Patient was started on a CLD on 12/6-12/9 and then advanced to a soft diet since 12/10.  Patient has had minimal PO intake since being post-op and continues to experience nausea. Delayed gastric emptying likely related to possible pancreatic leak.  Pharmacy consulted to dose TPN.  Glucose / Insulin : A1c 3.7 (11/15/23) - glucose <160, no SSI ordered Electrolytes: 1/6: Na: 137, K: 3.3, HCO3: 23, CoCa: 10, phos: 3.0 , mag: 1.9 Renal: 1/4: SCr < 1, BUN WNL Hepatic: 1/2: LFTs / tbili WNL, Alk phos 209, albumin  2.0 Intake / Output; MIVF: UOP not charted 2+ unmeasured, NGT back to suction -200, drain 70 mL, LBM 12/29 (s/p Reglan  x3 doses 12/11-12/12, restarted 12/15) GI Imaging: 12/07 Ab XR: unremarkable bowel gas pattern. 12/11 CT: possible post-op seroma or hematoma vs abscess, distal colonic diverticulosis 12/15 Ab XR: expected post-op changes, NGT in correct place 12/26- CT ABD- perc drain cath adj left lobe liver- no discernable residual fluid collection. Post whipple w/ no evidence pancreatic ductal dilatation or peripancreatic fluid collection, small amount of free fluid in pelvis 1/2 CT ABD - minimal fluid near drain in abdomen  GI Surgeries / Procedures:  12/3 diagnostic laparoscopy, Whipple procedure, pancreatic duct stent 12/13 subhepatic fluid collection aspiration and drainage 1/3 IR US  drain placement of abdominal abscess   Central access: PICC placed 12/01/23. She pulled the line out 1/5-6. VAT is working on getting another line placed TPN  start date: 12/02/23  Nutritional Goals: Goal TPN rate is 19mL/hr providing 1400kcal and 65g protein meeting 100% of nutritional needs.   RD Estimated Needs Total Energy Estimated Needs: 1500-1700 kcal Total Protein Estimated Needs: 75-85 gm Total Fluid Estimated Needs: 1 ml/kcal  Current Nutrition:  TPN NPO   Plan:  Continue TPN at goal rate 31mL/hr providing 1500kcal and 75g protein meeting 100% of nutritional needs.  Electrolytes in TPN: Na 20mEq/L, K 71mEq/L, Ca 34mEq/L, Mg 56mEq/L, and Phos 15mmol/L. Cl:Ac, max acetate.  Potassium 10 meq iv x3 runs DC CBG's WNL >48 hours Bmet 1/7 Monitor TPN labs on Mon/Thurs Ability to advance diet as able.   Benedetta Heath BS, PharmD, BCPS Clinical Pharmacist 12/27/2023 7:57 AM  Contact: [phone] after 3 PM  Be curious, not judgmental... -Davina Sprinkles

## 2023-12-27 NOTE — Progress Notes (Addendum)
 When entering pt room Pt found agitated took off gown and had pulled PICC line MD informed orders for new PICC line, IV fluids. Safety mittens placed and PRN haldol IV given.Will continue to monitor.pt

## 2023-12-27 NOTE — Progress Notes (Signed)
 34 Days Post-Op   Subjective/Chief Complaint: Pt pulled picc out last night due to agitation.  Required haldol  and soft mittens.    Objective: Vital signs in last 24 hours: Temp:  [97.4 F (36.3 C)-98.1 F (36.7 C)] 98.1 F (36.7 C) (01/06 0801) Pulse Rate:  [71-80] 80 (01/06 0801) Resp:  [15-20] 15 (01/06 0801) BP: (138-146)/(54-63) 138/57 (01/06 0801) SpO2:  [98 %-100 %] 100 % (01/06 0801) Weight:  [48.6 kg] 48.6 kg (01/06 0320) Last BM Date : 01/18/24  Intake/Output from previous day: 01/05 0701 - 01/06 0700 In: 0  Out: 270 [Emesis/NG output:200; Drains:70] Intake/Output this shift: No intake/output data recorded.  Physical Exam:  General appearance: sitting up in chair. alert, cooperative. Was able to ambulate to bathroom with assistance NGT bilious output Resp: breathing comfortably GI: soft, non distended. Mild tenderness upper abdomen. Less redness than pre I&D. No rigidity or guarding. +BS. Midline wound with staples out, dressing in place without staining             D1 - LLQ - 40cc -purulent appearing             D2 RLQ - 30cc - white/yellow, purulent appearing Extremities: No LE edema  Lab Results:  Recent Labs    12/26/23 1101 12/27/23 0430  WBC 8.9 8.6  HGB 9.4* 9.3*  HCT 30.2* 29.7*  PLT 518* 519*    BMET Recent Labs    12/26/23 1101 12/27/23 0430  NA 135 137  K 3.4* 3.3*  CL 105 105  CO2 20* 23  GLUCOSE 158* 143*  BUN 25* 17  CREATININE 0.42* 0.37*  CALCIUM  8.6* 8.4*   PT/INR No results for input(s): LABPROT, INR in the last 72 hours.   ABG No results for input(s): PHART, HCO3 in the last 72 hours.  Invalid input(s): PCO2, PO2   Studies/Results:  Anti-infectives: Anti-infectives (From admission, onward)    Start     Dose/Rate Route Frequency Ordered Stop   12/24/23 1000  piperacillin -tazobactam (ZOSYN ) IVPB 3.375 g        3.375 g 12.5 mL/hr over 240 Minutes Intravenous Every 8 hours 12/24/23 0913     12/13/23  1700  piperacillin -tazobactam (ZOSYN ) IVPB 3.375 g        3.375 g 12.5 mL/hr over 240 Minutes Intravenous Every 8 hours 12/13/23 1412 12/13/23 2139   11/30/23 1230  fluconazole  (DIFLUCAN ) IVPB 400 mg        400 mg 100 mL/hr over 120 Minutes Intravenous Every 24 hours 11/30/23 1135     11/26/23 0945  piperacillin -tazobactam (ZOSYN ) IVPB 3.375 g  Status:  Discontinued        3.375 g 12.5 mL/hr over 240 Minutes Intravenous Every 8 hours 11/26/23 0853 12/13/23 1412   11/23/23 2100  ceFAZolin  (ANCEF ) IVPB 2g/100 mL premix        2 g 200 mL/hr over 30 Minutes Intravenous Every 8 hours 11/23/23 1654 11/23/23 2214   11/23/23 0700  ceFAZolin  (ANCEF ) IVPB 2g/100 mL premix        2 g 200 mL/hr over 30 Minutes Intravenous On call to O.R. 11/23/23 0654 11/23/23 1249       Assessment/Plan: Ms. Narayan is an 88 year old female status post Whipple Procedure on 11/23/2023, (POD 34).  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 Rare candida seen on final culture. Completed Zosyn . On fluconazole .  Repeat CT 1/2 with abdominal wall collection now s/p IR aspiration 1/3 and bedside I&D 1/4.  Zosyn  restarted 1/3 -  Pancreatic leak - Cont surgical drains.  IR drain removed. Drain output overall stable. Overall trending down. Delayed gastric emptying. Qtc ok.  Continue reglan . TPN. Failed clamping trial earlier this week. Cont NGT to LIWS.  HOCM - continue beta blockade and treat hypertension. IV metoprolol  to q4h instead of q6 per Cardiology.   Anemia of chronic disease, chronic blood loss anemia, and acute blood loss anemia- S/p 1U PRBC 12/27. On therapeutic lovenox . Hyperglycemia - improving, monitor   Hypophosphatemia - resolved Hypokalemia - resolved. Pharmacy monitoring.  Severe protein calorie malnutrition - TNA. RUE DVT - treatment dose lovenox ,  reviewed with vascular and will continue PICC for now OOB, IS, PT consult.  Thrush - nystatin  suspension  FEN - TPN. Cont PPI BID & Carafate  via tube. NGT to LIWS VTE - lovenox  treatment dose - cont. Replace picc on opposite side.   ID - Fluconazole , resumed Zosyn  1/3 >> afebrile with normal WBCs.     LOS: 34 days    12/27/2023, 8:33 AM  Jina LITTIE Nephew, MD, FACS, FSSO Surgical Oncology, General Surgery, Trauma and Critical [facility] Surgery, [place] [phone] for weekday/non holidays Check [email or web address] for coverage night/weekend/holidays

## 2023-12-27 NOTE — Progress Notes (Signed)
 Physical Therapy Treatment Patient Details Name: Christine Morse MRN: [id number] DOB: Jan 26, 1936 Today's Date: 12/27/2023   History of Present Illness Patient is an 88 y/o female admitted 11/23/23 with diagnosis of duodenal adenoma and underwent laparoscopic Whipple procedure. post-op subcapsular fluid collection around the anterior aspect of the left liver lobe. Drain placement in IR 12/03/23. NGT inserted 12/15, dislodged then replaced.  Positive for R subclavian and axillary DVT on 12/07/23. 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    PT Comments  Pt seen in the a.m for the first time in a while.  Pt is more conversant with noticeably more energy.  Pt still has mild balance issues, mostly managed by the RW, but tends to list posteriorly at time and mildly retropulsive at times, alleviated by a hand resting on her back.  Emphasized propulsion herself, increasing gait speed and stride.    If plan is discharge home, recommend the following: A little help with walking and/or transfers;A little help with bathing/dressing/bathroom;Assistance with cooking/housework;Assist for transportation   Can travel by private vehicle     Yes  Equipment Recommendations  Rolling walker (2 wheels);BSC/3in1    Recommendations for Other Services       Precautions / Restrictions Precautions Precautions: Fall Precaution Comments: x2 blake drains, x1 JP drain, NG tube     Mobility  Bed Mobility               General bed mobility comments: Pt in recliner.    Transfers Overall transfer level: Needs assistance Equipment used: Rolling walker (2 wheels) Transfers: Sit to/from Stand Sit to Stand: Min assist           General transfer comment: stability given once up.  minimal forward assist, less boost assist.    Ambulation/Gait Ambulation/Gait assistance: Contact guard assist, Min assist Gait Distance (Feet): 300 Feet Assistive device: Rolling walker (2  wheels) Gait Pattern/deviations: Step-through pattern Gait velocity: decreased Gait velocity interpretation: <1.8 ft/sec, indicate of risk for recurrent falls   General Gait Details: generally steady.  short steps, chronically flexed posture, light use of RW.  Worked today on increasing step length and increasing gait speed.  Cues for posture and correcting drift (mostly to the L).   Stairs             Wheelchair Mobility     Tilt Bed    Modified Rankin (Stroke Patients Only)       Balance Overall balance assessment: Needs assistance Sitting-balance support: Feet supported, No upper extremity supported Sitting balance-[name]: Good       Standing balance-[name]: Fair Standing balance comment: preferring AD for support.                            Cognition Arousal: Alert Behavior During Therapy: Flat affect Overall Cognitive Status:  (follows commands well, states I don't know to a majority of clarifying questions.) Area of Impairment: Memory, Safety/judgement, Problem solving                     Memory: Decreased short-term memory   Safety/Judgement: Decreased awareness of deficits   Problem Solving: Slow processing, Requires verbal cues General Comments: flat affect, follows commands appropriately.        Exercises      General Comments        Pertinent Vitals/Pain Pain Assessment Pain Assessment: Faces Faces Pain Scale: Hurts a little bit  Pain Location: abdomen Pain Descriptors / Indicators: Discomfort, Guarding Pain Intervention(s): Monitored during session    Home Living                          Prior Function            PT Goals (current goals can now be found in the care plan section) Acute Rehab PT Goals Patient Stated Goal: return to independent PT Goal Formulation: With patient Time For Goal Achievement: 01/04/24 Potential to Achieve Goals: Fair Progress towards PT goals: Progressing  toward goals    Frequency    Min 1X/week      PT Plan      Co-evaluation              AM-PAC PT 6 Clicks Mobility   Outcome Measure  Help needed turning from your back to your side while in a flat bed without using bedrails?: A Little Help needed moving from lying on your back to sitting on the side of a flat bed without using bedrails?: A Little Help needed moving to and from a bed to a chair (including a wheelchair)?: A Little Help needed standing up from a chair using your arms (e.g., wheelchair or bedside chair)?: A Little Help needed to walk in hospital room?: A Little Help needed climbing 3-5 steps with a railing? : A Lot 6 Click Score: 17    End of Session   Activity Tolerance: Patient tolerated treatment well Patient left: in chair;with call bell/phone within reach Nurse Communication: Mobility status PT Visit Diagnosis: Other abnormalities of gait and mobility (R26.89);Pain     Time: 8953-8896 PT Time Calculation (min) (ACUTE ONLY): 17 min  Charges:    $Gait Training: 8-22 mins PT General Charges $$ ACUTE PT VISIT: 1 Visit                     12/27/2023  India HERO., PT Acute Rehabilitation Services [phone]  (office)   Vinie GAILS Arra Connaughton 12/27/2023, 11:11 AM

## 2023-12-27 NOTE — Progress Notes (Signed)
 Peripherally Inserted Central Catheter Placement  The IV Nurse has discussed with the patient and/or persons authorized to consent for the patient, the purpose of this procedure and the potential benefits and risks involved with this procedure.  The benefits include less needle sticks, lab draws from the catheter, and the patient may be discharged home with the catheter. Risks include, but not limited to, infection, bleeding, blood clot (thrombus formation), and puncture of an artery; nerve damage and irregular heartbeat and possibility to perform a PICC exchange if needed/ordered by physician.  Alternatives to this procedure were also discussed.  Bard Power PICC patient education guide, fact sheet on infection prevention and patient information card has been provided to patient /or left at bedside.  PICC inserted by Maryalice Nephew, RN  PICC Placement Documentation  PICC Double Lumen 12/27/23 Left Basilic 39 cm 0 cm (Active)  Indication for Insertion or Continuance of Line Administration of hyperosmolar/irritating solutions (i.e. TPN, Vancomycin, etc.) 12/27/23 1210  Exposed Catheter (cm) 0 cm 12/27/23 1210  Site Assessment Clean, Dry, Intact 12/27/23 1210  Lumen #1 Status Flushed;Saline locked;Blood return noted 12/27/23 1210  Lumen #2 Status Flushed;Saline locked;Blood return noted 12/27/23 1210  Dressing Type Transparent;Securing device 12/27/23 1210  Dressing Status Antimicrobial disc in Morse;Clean, Dry, Intact 12/27/23 1210  Line Care Connections checked and tightened 12/27/23 1210  Line Adjustment (NICU/IV Team Only) No 12/27/23 1210  Dressing Intervention New dressing;Adhesive placed at insertion site (IV team only) 12/27/23 1210  Dressing Change Due 01/03/24 12/27/23 1210       Christine Morse, Christine Morse 12/27/2023, 12:11 PM

## 2023-12-28 LAB — CBC
HCT: 29.3 % — ABNORMAL LOW (ref 36.0–46.0)
Hemoglobin: 9.2 g/dL — ABNORMAL LOW (ref 12.0–15.0)
MCH: 28.5 pg (ref 26.0–34.0)
MCHC: 31.4 g/dL (ref 30.0–36.0)
MCV: 90.7 fL (ref 80.0–100.0)
Platelets: 512 10*3/uL — ABNORMAL HIGH (ref 150–400)
RBC: 3.23 MIL/uL — ABNORMAL LOW (ref 3.87–5.11)
RDW: 16 % — ABNORMAL HIGH (ref 11.5–15.5)
WBC: 8.9 10*3/uL (ref 4.0–10.5)
nRBC: 0 % (ref 0.0–0.2)

## 2023-12-28 LAB — BASIC METABOLIC PANEL
Anion gap: 9 (ref 5–15)
BUN: 15 mg/dL (ref 8–23)
CO2: 25 mmol/L (ref 22–32)
Calcium: 8.6 mg/dL — ABNORMAL LOW (ref 8.9–10.3)
Chloride: 103 mmol/L (ref 98–111)
Creatinine, Ser: 0.43 mg/dL — ABNORMAL LOW (ref 0.44–1.00)
GFR, Estimated: >60 mL/min (ref >60)
Glucose, Bld: 156 mg/dL — ABNORMAL HIGH (ref 70–99)
Potassium: 4.1 mmol/L (ref 3.5–5.1)
Sodium: 137 mmol/L (ref 135–145)

## 2023-12-28 LAB — GLUCOSE, CAPILLARY: Glucose-Capillary: 161 mg/dL — ABNORMAL HIGH (ref 70–99)

## 2023-12-28 MED ORDER — TRAVASOL 10 % IV SOLN
INTRAVENOUS | Status: AC
  Filled 2023-12-28: qty 1008

## 2023-12-28 MED ORDER — TRAVASOL 10 % IV SOLN
INTRAVENOUS | Status: DC
  Filled 2023-12-28: qty 749.95

## 2023-12-28 NOTE — Progress Notes (Signed)
 Pt NG was clamped since 0800 this am and now report abd discomfort, nausea and pain. Pt back on LIWS as verbally ordered by MD. Dionne Bucy RN

## 2023-12-28 NOTE — Progress Notes (Signed)
 Pt NG clamped at 1730, pt denies any nausea or pain. 400 ml output during shift, bilious drainage output. Report given to oncoming RN.

## 2023-12-28 NOTE — Progress Notes (Addendum)
 Nutrition Follow-up  DOCUMENTATION CODES:   Severe malnutrition in context of acute illness/injury  INTERVENTION:   - Diet advancement per MD  - TPN per pharmacy (Pharmacy to add MVI to TPN)             *Updated energy needs below  NUTRITION DIAGNOSIS:   Severe Malnutrition (in the context of acute illness) related to  (inadequate energy intake) as evidenced by energy intake < or equal to 50% for > or equal to 5 days, moderate fat depletion, moderate muscle depletion.  - Still applicable   GOAL:   Patient will meet greater than or equal to 90% of their needs  - Meeting via TPN  MONITOR:   PO intake, Supplement acceptance, Labs, Weight trends  REASON FOR ASSESSMENT:   Consult Assessment of nutrition requirement/status  ASSESSMENT:   Pt with hx of HTN, HLD, GERD, and diverticulosis presented for planned surgery after a large duodenal mass was seen on imaging at admission in November and was presumed to be malignant. Underwent pancreaticoduodenectomy and placement of pancreatic duct stent and confirmed tohave adenocarcinoma duodenum.  12/3 - Op, whipple procedure and placement of pancreatic duct stent.  12/10 - Soft diet  12/11 CT: possible post-op seroma or hematoma vs abscess, distal colonic diverticulosis  12/12 - TPN started,  12/13 - CT aspiration and drain placement, NPO 12/15 - NG inserted for persistent emesis  12/18- venous duplex positive for VTE; started on treatment dose lovenox  12/20- NGT clamping trials 12/21- NGT d/c, advanced to clear liquids 12/22- advanced to full liquids 12/23 - clear liquids 12/24 - NPO 12/25 NGT replaced 1/5-1/6 - Pt pulled PICC line, TPN stopped, replaced 1/6 1/7 - NGT clamping trials   TPN continuing, pt pulled PICC out 1/5-1/6. Replaced yesterday. Pt still NPO. Pt with possible pancreatic leak, still having delayed gastric emptying, on Reglan  Q8. NGT still in place, clamping trial today per surgery.  Having some nausea. MD  allowing sips of clears.  Last BM was 12/29. Pt states she is passing flatus.    Pt's weight continues to trend down. Of note pt has been on/off a diet since admission with poor PO intake. Since starting TPN on 12/12 pt has only been able to receive 1257 kCal and 92g AA per day due to Clinimix  shortage. Pt transitioned to compounded TPN 1/3. Last follow up increased her energy needs. Notified pharmacy of updated energy recs for today. TPN now running at 75 ml/hr, providing 1739 and 101 gm protein meeting 100% of nutritional needs.   Admit weight: 55.3 kg - stated? Current weight: 47.3 kg    Hospital Admission 1 month Weight History: Date/Time Weight Weight in lbs BMI (Calculated)  12/28/23 0325 47.3 kg 104.28 lbs 21.79  12/27/23 0320 48.6 kg 107.14 lbs --  12/25/23 0431 50.2 kg 110.6 lbs --  12/24/23 0437 50.2 kg 110.6 lbs --  12/23/23 1448 50.2 kg 110.6 lbs --  12/23/23 0500 52 kg 114.64 lbs 23.97  12/22/23 0500 52 kg 114.64 lbs 23.97  12/20/23 0344 52 kg 114.64 lbs 23.97  12/18/23 0500 57.2 kg 126 lbs 26.34  12/17/23 0500 57.2 kg 126 lbs 26.34  12/16/23 0500 57.2 kg 126 lbs 26.34  12/15/23 0500 57.2 kg 126 lbs 26.34  12/13/23 0253 57.2 kg 126 lbs 26.34  12/11/23 0500 57.9 kg 127.65 lbs 26.69  12/09/23 0325 57.9 kg 127.65 lbs 26.69  12/08/23 0300 58.8 kg 129.63 lbs 27.1  12/06/23 0330 57.9 kg 127.65 lbs 26.69  12/05/23  0500 61 kg 134.39 lbs 28.1  12/04/23 0500 61 kg 134.39 lbs 28.1  12/03/23 0500 61 kg 134.4 lbs 28.1  12/02/23 0457 61 kg 134.4 lbs 28.1  11/30/23 1403 61 kg 134.4 lbs 28.1    Intake/Output Summary (Last 24 hours) at 12/28/2023 1354 Last data filed at 12/28/2023 0800 Gross per 24 hour  Intake 787.54 ml  Output 835 ml  Net -47.46 ml   Drains/Lines: NG: 600 ml x 24 hours Closed system drain 1 L LLQ: 125 ml x 24 hours Closed system drain 1 R RLQ: 10 ml x 24 hours   Average Meal Intake: NPO  Nutritionally Relevant Medications: Scheduled Meds:  Chlorhexidine   Gluconate Cloth  6 each Topical Q0600   enoxaparin  (LOVENOX ) injection  50 mg Subcutaneous Q12H   metoCLOPramide  (REGLAN ) injection  5 mg Intravenous Q8H   metoprolol  tartrate  7.5 mg Intravenous 6 X Daily   nystatin   5 mL Oral QID   pantoprazole  (PROTONIX ) IV  40 mg Intravenous Q12H   sodium chloride  flush  10-40 mL Intracatheter Q12H   sodium chloride  flush  10-40 mL Intracatheter Q12H   sodium chloride  flush  5 mL Intracatheter Q8H   sucralfate   1 g Per Tube TID   Continuous Infusions:  piperacillin -tazobactam (ZOSYN )  IV 3.375 g (12/28/23 0937)   TPN ADULT (ION) 60 mL/hr at 12/27/23 1748   TPN ADULT (ION)     Labs Reviewed: Creatine 0.43, Calcium  8.6  CBG ranges from 107 mg/dL over the last 24 hours HgbA1c 3.7 10/2023   NUTRITION - FOCUSED PHYSICAL EXAM: Completed 12/24/23  Flowsheet Row Most Recent Value  Orbital Region Severe depletion  Upper Arm Region Severe depletion  Thoracic and Lumbar Region Severe depletion  Buccal Region Severe depletion  Temple Region Severe depletion  Clavicle Bone Region Severe depletion  Clavicle and Acromion Bone Region Severe depletion  Scapular Bone Region Severe depletion  Dorsal Hand Severe depletion  Patellar Region Severe depletion  Anterior Thigh Region Severe depletion  Posterior Calf Region Severe depletion  Edema (RD Assessment) None  Hair Reviewed  Eyes Reviewed  Mouth Reviewed  Skin Reviewed  Nails Reviewed       Diet Order:   Diet Order             Diet NPO time specified Except for: Ice Chips, Other (See Comments)  Diet effective now                   EDUCATION NEEDS:   Education needs have been addressed  Skin:  Skin Assessment: Skin Integrity Issues: Skin Integrity Issues:: Incisions Incisions: Abdomen  Last BM:  12/29  Height:   Ht Readings from Last 1 Encounters:  11/23/23 4' 10 (1.473 m)    Weight:   Wt Readings from Last 1 Encounters:  12/28/23 47.3 kg    Ideal Body Weight:  43.9  kg  BMI:  Body mass index is 21.79 kg/m.  Estimated Nutritional Needs:   Kcal:  1700-2000 kcal  Protein:  85-100 gm  Fluid:  1 ml/kcal   Olivia Kenning, RD Registered Dietitian  See Amion for more information

## 2023-12-28 NOTE — Progress Notes (Signed)
 35 Days Post-Op   Subjective/Chief Complaint: Picc replaced yesterday.    Objective: Vital signs in last 24 hours: Temp:  [97 F (36.1 C)-98 F (36.7 C)] 97.8 F (36.6 C) (01/07 0715) Pulse Rate:  [70-91] 76 (01/07 0715) Resp:  [17-20] 18 (01/07 0715) BP: (135-155)/(50-77) 142/62 (01/07 0715) SpO2:  [97 %-100 %] 99 % (01/07 0715) Weight:  [47.3 kg] 47.3 kg (01/07 0325) Last BM Date : 12/19/23  Intake/Output from previous day: 01/06 0701 - 01/07 0700 In: 787.5 [I.V.:694; IV Piggyback:93.5] Out: 735 [Emesis/NG output:600; Drains:135] Intake/Output this shift: Total I/O In: -  Out: 100 [Emesis/NG output:100]  Physical Exam:  General appearance: sitting up in chair. alert, cooperative. Was able to ambulate to bathroom with assistance Resp: breathing comfortably GI: soft, non distended. Mild tenderness upper abdomen. Less redness than pre I&D. No rigidity or guarding. +BS. Midline wound with staples out, dressing in place without staining             D1 - LLQ - 75 cc -purulent appearing             D2 RLQ - 10 cc - white/yellow, purulent appearing Extremities: No LE edema  Lab Results:  Recent Labs    12/27/23 0430 12/28/23 0539  WBC 8.6 8.9  HGB 9.3* 9.2*  HCT 29.7* 29.3*  PLT 519* 512*    BMET Recent Labs    12/27/23 0430 12/28/23 0539  NA 137 137  K 3.3* 4.1  CL 105 103  CO2 23 25  GLUCOSE 143* 156*  BUN 17 15  CREATININE 0.37* 0.43*  CALCIUM  8.4* 8.6*   PT/INR No results for input(s): LABPROT, INR in the last 72 hours.   ABG No results for input(s): PHART, HCO3 in the last 72 hours.  Invalid input(s): PCO2, PO2   Studies/Results:  Anti-infectives: Anti-infectives (From admission, onward)    Start     Dose/Rate Route Frequency Ordered Stop   12/24/23 1000  piperacillin -tazobactam (ZOSYN ) IVPB 3.375 g        3.375 g 12.5 mL/hr over 240 Minutes Intravenous Every 8 hours 12/24/23 0913     12/13/23 1700  piperacillin -tazobactam  (ZOSYN ) IVPB 3.375 g        3.375 g 12.5 mL/hr over 240 Minutes Intravenous Every 8 hours 12/13/23 1412 12/13/23 2139   11/30/23 1230  fluconazole  (DIFLUCAN ) IVPB 400 mg        400 mg 100 mL/hr over 120 Minutes Intravenous Every 24 hours 11/30/23 1135     11/26/23 0945  piperacillin -tazobactam (ZOSYN ) IVPB 3.375 g  Status:  Discontinued        3.375 g 12.5 mL/hr over 240 Minutes Intravenous Every 8 hours 11/26/23 0853 12/13/23 1412   11/23/23 2100  ceFAZolin  (ANCEF ) IVPB 2g/100 mL premix        2 g 200 mL/hr over 30 Minutes Intravenous Every 8 hours 11/23/23 1654 11/23/23 2214   11/23/23 0700  ceFAZolin  (ANCEF ) IVPB 2g/100 mL premix        2 g 200 mL/hr over 30 Minutes Intravenous On call to O.R. 11/23/23 0654 11/23/23 1249       Assessment/Plan: Ms. Brazel is an 88 year old female status post Whipple Procedure on 11/23/2023, (POD 35).  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 Rare candida seen on final culture. Completed Zosyn . On fluconazole . Repeat CT 1/2 with abdominal wall  collection now s/p IR aspiration 1/3 and bedside I&D 1/4.  Zosyn  restarted 1/3 -  Pancreatic leak - Cont surgical drains.  IR drain removed. Drain output overall stable. Overall trending down. Delayed gastric emptying. Qtc ok.  Continue reglan . TPN. Failed clamping trial earlier last week. Try clamping again. HOCM - continue beta blockade and treat hypertension. IV metoprolol  to q4h instead of q6 per Cardiology.   Anemia of chronic disease, chronic blood loss anemia, and acute blood loss anemia- S/p 1U PRBC 12/27. On therapeutic lovenox . Hyperglycemia - improving, monitor   Hypophosphatemia - resolved Hypokalemia - resolved. Pharmacy monitoring.  Severe protein calorie malnutrition - TNA. RUE DVT - treatment dose lovenox .  New picc placed opposite side.    OOB, IS, PT consult.  Thrush - nystatin  suspension  FEN - TPN. Cont PPI BID & Carafate  via tube. Try clamping NGT again.   VTE - lovenox  treatment dose - cont. Replace picc on opposite side.   ID - Fluconazole - stop today, resumed Zosyn  1/3 >> afebrile with normal WBCs.     LOS: 35 days    12/28/2023, 10:12 AM  Jina LITTIE Nephew, MD, FACS, FSSO Surgical Oncology, General Surgery, Trauma and Critical [facility], [place] [phone] for weekday/non holidays Check [email or web address] for coverage night/weekend/holidays

## 2023-12-28 NOTE — Progress Notes (Signed)
 Occupational Therapy Treatment Patient Details Name: Christine Morse MRN: [id number] DOB: Nov 20, 1936 Today's Date: 12/28/2023   History of present illness Patient is an 88 y/o female admitted 11/23/23 with diagnosis of duodenal adenoma and underwent laparoscopic Whipple procedure. post-op subcapsular fluid collection around the anterior aspect of the left liver lobe. Drain placement in IR 12/03/23. NGT inserted 12/15, dislodged then replaced.  Positive for R subclavian and axillary DVT on 12/07/23. 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   OT comments  Patient making good progress and eager to participate. Patient requires assistance for UB dressing due to lines and able to stand to donn and doff gown for back. Patient requiring min assist to power up from recliner and CGA for mobility and transfers once up. Patient will benefit from continued inpatient follow up therapy, <3 hours/day to continue to address bathing, dressing, and functional transfers. Acute OT to continue to follow.       If plan is discharge home, recommend the following:  A lot of help with bathing/dressing/bathroom;A little help with walking and/or transfers;Assist for transportation;Assistance with cooking/housework;Help with stairs or ramp for entrance   Equipment Recommendations  None recommended by OT    Recommendations for Other Services      Precautions / Restrictions Precautions Precautions: Fall Precaution Comments: x2 blake drains, x1 JP drain, NG tube Restrictions Weight Bearing Restrictions Per Provider Order: No       Mobility Bed Mobility Overal bed mobility: Needs Assistance             General bed mobility comments: Pt in recliner.    Transfers Overall transfer level: Needs assistance Equipment used: Rolling walker (2 wheels) Transfers: Sit to/from Stand Sit to Stand: Min assist           General transfer comment: cues for hand placement and min  assist to power up. CGA while up with RW     Balance Overall balance assessment: Needs assistance Sitting-balance support: Feet supported, No upper extremity supported Sitting balance-[name]: Good Sitting balance - Comments: Edge of Chair   Standing balance support: Bilateral upper extremity supported, During functional activity, No upper extremity supported Standing balance-[name]: Fair Standing balance comment: RW for support                           ADL either performed or assessed with clinical judgement   ADL Overall ADL's : Needs assistance/impaired     Grooming: Contact guard assist;Standing           Upper Body Dressing : Sitting;Minimal assistance;Standing Upper Body Dressing Details (indicate cue type and reason): changed gown and gown for back Lower Body Dressing: Moderate assistance;Sit to/from stand Lower Body Dressing Details (indicate cue type and reason): donning slippers Toilet Transfer: Ambulation;Rolling walker (2 wheels);Minimal assistance Toilet Transfer Details (indicate cue type and reason): simulated to recliner                Extremity/Trunk Assessment              Vision       Perception     Praxis      Cognition Arousal: Alert Behavior During Therapy: Flat affect Overall Cognitive Status: No family/caregiver present to determine baseline cognitive functioning Area of Impairment: Memory, Safety/judgement, Problem solving                     Memory: Decreased short-term memory  Safety/Judgement: Decreased awareness of deficits   Problem Solving: Slow processing, Requires verbal cues General Comments: Did not recall therapist checking on her earlier this am        Exercises      Shoulder Instructions       General Comments VSS on RA    Pertinent Vitals/ Pain       Pain Assessment Pain Assessment: Faces Faces Pain Scale: Hurts a little bit Pain Location: abdomen Pain Descriptors /  Indicators: Discomfort, Guarding Pain Intervention(s): Monitored during session, Repositioned  Home Living                                          Prior Functioning/Environment              Frequency  Min 1X/week        Progress Toward Goals  OT Goals(current goals can now be found in the care plan section)  Progress towards OT goals: Progressing toward goals  Acute Rehab OT Goals Patient Stated Goal: get better OT Goal Formulation: With patient Time For Goal Achievement: 01/03/24 Potential to Achieve Goals: Good ADL Goals Pt Will Perform Grooming: with supervision;standing Pt Will Perform Upper Body Bathing: with set-up;sitting Pt Will Perform Lower Body Bathing: sit to/from stand;with contact guard assist Pt Will Perform Lower Body Dressing: with contact guard assist;sit to/from stand Pt Will Transfer to Toilet: with supervision;ambulating;regular height toilet  Plan      Co-evaluation                 AM-PAC OT 6 Clicks Daily Activity     Outcome Measure   Help from another person eating meals?: A Little Help from another person taking care of personal grooming?: A Little Help from another person toileting, which includes using toliet, bedpan, or urinal?: A Lot Help from another person bathing (including washing, rinsing, drying)?: A Lot Help from another person to put on and taking off regular upper body clothing?: A Little Help from another person to put on and taking off regular lower body clothing?: A Lot 6 Click Score: 15    End of Session Equipment Utilized During Treatment: Rolling walker (2 wheels)  OT Visit Diagnosis: Unsteadiness on feet (R26.81);Muscle weakness (generalized) (M62.81)   Activity Tolerance Patient tolerated treatment well   Patient Left in chair;with call bell/phone within reach;with chair alarm set   Nurse Communication Mobility status        Time: [phone] OT Time Calculation (min): 22  min  Charges: OT General Charges $OT Visit: 1 Visit OT Treatments $Self Care/Home Management : 8-22 mins  Dick Laine, OTA Acute Rehabilitation Services  Office [phone]   Jeb LITTIE Laine 12/28/2023, 1:00 PM

## 2023-12-28 NOTE — Progress Notes (Addendum)
 PHARMACY - TOTAL PARENTERAL NUTRITION CONSULT NOTE  Indication:  delayed gastric emptying  Patient Measurements: Height: 4' 10 (147.3 cm) Weight: 47.3 kg (104 lb 4.4 oz) IBW/kg (Calculated) : 40.9 TPN AdjBW (KG): 44.5 Body mass index is 21.79 kg/m. 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  Patient was started on a CLD on 12/6-12/9 and then advanced to a soft diet since 12/10.  Patient has had minimal PO intake since being post-op and continues to experience nausea. Delayed gastric emptying likely related to possible pancreatic leak.  Pharmacy consulted to dose TPN.  Glucose / Insulin : A1c 3.7 (11/15/23) - glucose <160, no SSI ordered Electrolytes: 1/6: Na: 137, K: 4.1, HCO3: 25, CoCa: 10.2, phos: 3.0 , mag: 1.9 Renal: 1/4: SCr < 1, BUN WNL Hepatic: 1/2: LFTs / tbili WNL, Alk phos 209, albumin  2.0 Intake / Output; MIVF: UOP not charted 2+ unmeasured, NGT back to suction -600, drain 135 mL, LBM 12/29 (s/p Reglan  x3 doses 12/11-12/12, restarted 12/15) GI Imaging: 12/07 Ab XR: unremarkable bowel gas pattern. 12/11 CT: possible post-op seroma or hematoma vs abscess, distal colonic diverticulosis 12/15 Ab XR: expected post-op changes, NGT in correct place 12/26- CT ABD- perc drain cath adj left lobe liver- no discernable residual fluid collection. Post whipple w/ no evidence pancreatic ductal dilatation or peripancreatic fluid collection, small amount of free fluid in pelvis 1/2 CT ABD - minimal fluid near drain in abdomen  GI Surgeries / Procedures:  12/3 diagnostic laparoscopy, Whipple procedure, pancreatic duct stent 12/13 subhepatic fluid collection aspiration and drainage 1/3 IR US  drain placement of abdominal abscess   Central access: PICC placed 12/01/23. She pulled the line out 1/5-6. VAT is working on getting another line  placed TPN start date: 12/02/23  Nutritional Goals: Goal TPN rate is 75 mL/hr providing 1739 kcal and 101 g protein meeting 100% of nutritional needs.   RD Estimated Needs Total Energy Estimated Needs: 1700-2000 kcal Total Protein Estimated Needs: 85-100 gm Total Fluid Estimated Needs: 1 ml/kcal  Current Nutrition:  TPN NPO   Plan:  change TPN to goal rate 75 mL/hr providing 1739 kcal and 101 g protein meeting 100% of nutritional needs.  Electrolytes in TPN: Na 21mEq/L, K 58mEq/L, Ca 57mEq/L, Mg 9mEq/L, and Phos 15mmol/L. Cl:Ac, max acetate.  Monitor TPN labs on Mon/Thurs, Bmet, Phos, Mag AM labs 1/8 Ability to advance diet as able.   Benedetta Heath BS, PharmD, BCPS Clinical Pharmacist 12/28/2023 10:22 AM  Contact: [phone] after 3 PM  Be curious, not judgmental... -Davina Sprinkles

## 2023-12-28 NOTE — Plan of Care (Signed)
  Problem: Education: Goal: Knowledge of General Education information will improve Description: Including pain rating scale, medication(s)/side effects and non-pharmacologic comfort measures Outcome: Progressing   Problem: Clinical Measurements: Goal: Ability to maintain clinical measurements within normal limits will improve Outcome: Progressing   Problem: Activity: Goal: Risk for activity intolerance will decrease Outcome: Progressing   Problem: Pain Management: Goal: General experience of comfort will improve Outcome: Progressing   Problem: Clinical Measurements: Goal: Postoperative complications will be avoided or minimized Outcome: Progressing   Problem: Clinical Measurements: Goal: Will remain free from infection Outcome: Not Progressing   Problem: Elimination: Goal: Will not experience complications related to bowel motility Outcome: Not Progressing   Problem: Skin Integrity: Goal: Risk for impaired skin integrity will decrease Outcome: Not Progressing

## 2023-12-29 LAB — CBC
HCT: 31.1 % — ABNORMAL LOW (ref 36.0–46.0)
Hemoglobin: 9.6 g/dL — ABNORMAL LOW (ref 12.0–15.0)
MCH: 28.1 pg (ref 26.0–34.0)
MCHC: 30.9 g/dL (ref 30.0–36.0)
MCV: 90.9 fL (ref 80.0–100.0)
Platelets: 519 K/uL — ABNORMAL HIGH (ref 150–400)
RBC: 3.42 MIL/uL — ABNORMAL LOW (ref 3.87–5.11)
RDW: 16.5 % — ABNORMAL HIGH (ref 11.5–15.5)
WBC: 8.9 K/uL (ref 4.0–10.5)
nRBC: 0 % (ref 0.0–0.2)

## 2023-12-29 LAB — BASIC METABOLIC PANEL
Anion gap: 11 (ref 5–15)
BUN: 19 mg/dL (ref 8–23)
CO2: 25 mmol/L (ref 22–32)
Calcium: 8.5 mg/dL — ABNORMAL LOW (ref 8.9–10.3)
Chloride: 100 mmol/L (ref 98–111)
Creatinine, Ser: 0.56 mg/dL (ref 0.44–1.00)
GFR, Estimated: >60 mL/min (ref >60)
Glucose, Bld: 136 mg/dL — ABNORMAL HIGH (ref 70–99)
Potassium: 4 mmol/L (ref 3.5–5.1)
Sodium: 136 mmol/L (ref 135–145)

## 2023-12-29 LAB — MAGNESIUM: Magnesium: 1.9 mg/dL (ref 1.7–2.4)

## 2023-12-29 LAB — PHOSPHORUS: Phosphorus: 3 mg/dL (ref 2.5–4.6)

## 2023-12-29 MED ORDER — METRONIDAZOLE 500 MG/100ML IV SOLN
500.0000 mg | Freq: Two times a day (BID) | INTRAVENOUS | Status: DC
  Administered 2023-12-29 – 2024-01-13 (×30): 500 mg via INTRAVENOUS
  Filled 2023-12-29 (×30): qty 100

## 2023-12-29 MED ORDER — TRAVASOL 10 % IV SOLN
INTRAVENOUS | Status: AC
  Filled 2023-12-29: qty 1008

## 2023-12-29 MED ORDER — SODIUM CHLORIDE 0.9 % IV SOLN
2.0000 g | INTRAVENOUS | Status: DC
Start: 2023-12-29 — End: 2024-01-13
  Administered 2023-12-29 – 2024-01-12 (×15): 2 g via INTRAVENOUS
  Filled 2023-12-29 (×15): qty 20

## 2023-12-29 NOTE — Progress Notes (Signed)
 36 Days Post-Op   Subjective/Chief Complaint: Tried trial of NGT clamping again. Had to go back to suction pretty quickly.    Objective: Vital signs in last 24 hours: Temp:  [97.6 F (36.4 C)-97.9 F (36.6 C)] 97.9 F (36.6 C) (01/08 0326) Pulse Rate:  [68-82] 82 (01/08 0326) Resp:  [15-17] 17 (01/08 0326) BP: (125-159)/(50-70) 157/62 (01/08 0326) SpO2:  [99 %-100 %] 100 % (01/08 0326) Weight:  [47.3 kg] 47.3 kg (01/08 0326) Last BM Date : 01/19/24  Intake/Output from previous day: 01/07 0701 - 01/08 0700 In: 927.1 [I.V.:792.7; IV Piggyback:134.4] Out: 775 [Emesis/NG output:500; Drains:275] Intake/Output this shift: No intake/output data recorded.  Physical Exam:  General appearance: looks weak still Resp: breathing comfortably GI: soft, non distended. Mild tenderness upper abdomen. Less redness than pre I&D. No rigidity or guarding. +BS. Midline wound with staples out, dressing in place without staining             D1 - LLQ - 275 cc -purulent appearing             D2 RLQ - 0 cc - scant output  Extremities: No LE edema  Lab Results:  Recent Labs    12/28/23 0539 12/29/23 0526  WBC 8.9 8.9  HGB 9.2* 9.6*  HCT 29.3* 31.1*  PLT 512* 519*    BMET Recent Labs    12/28/23 0539 12/29/23 0526  NA 137 136  K 4.1 4.0  CL 103 100  CO2 25 25  GLUCOSE 156* 136*  BUN 15 19  CREATININE 0.43* 0.56  CALCIUM  8.6* 8.5*   PT/INR No results for input(s): LABPROT, INR in the last 72 hours.   ABG No results for input(s): PHART, HCO3 in the last 72 hours.  Invalid input(s): PCO2, PO2   Studies/Results:  Anti-infectives: Anti-infectives (From admission, onward)    Start     Dose/Rate Route Frequency Ordered Stop   12/24/23 1000  piperacillin -tazobactam (ZOSYN ) IVPB 3.375 g        3.375 g 12.5 mL/hr over 240 Minutes Intravenous Every 8 hours 12/24/23 0913     12/13/23 1700  piperacillin -tazobactam (ZOSYN ) IVPB 3.375 g        3.375 g 12.5 mL/hr over 240  Minutes Intravenous Every 8 hours 12/13/23 1412 12/13/23 2139   11/30/23 1230  fluconazole  (DIFLUCAN ) IVPB 400 mg  Status:  Discontinued        400 mg 100 mL/hr over 120 Minutes Intravenous Every 24 hours 11/30/23 1135 12/28/23 1321   11/26/23 0945  piperacillin -tazobactam (ZOSYN ) IVPB 3.375 g  Status:  Discontinued        3.375 g 12.5 mL/hr over 240 Minutes Intravenous Every 8 hours 11/26/23 0853 12/13/23 1412   11/23/23 2100  ceFAZolin  (ANCEF ) IVPB 2g/100 mL premix        2 g 200 mL/hr over 30 Minutes Intravenous Every 8 hours 11/23/23 1654 11/23/23 2214   11/23/23 0700  ceFAZolin  (ANCEF ) IVPB 2g/100 mL premix        2 g 200 mL/hr over 30 Minutes Intravenous On call to O.R. 11/23/23 0654 11/23/23 1249       Assessment/Plan: Christine Morse is an 88 year old female status post Whipple Procedure on 11/23/2023, (POD 36).  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 Rare candida seen on final culture. Completed Zosyn . Fluc stopped yesterday as repeat cx negative for candida.  Repeat CT 1/2 with abdominal wall collection now s/p IR aspiration 1/3 and bedside I&D 1/4.  Zosyn  restarted 1/3 - stopped 1/8 and switched to ceftriaxone /flagyl  based on cx of citrobacter and b frag.   Pancreatic leak - some leakage via abd wound.  IR drain removed. Drain output overall stable. Overall trending down. Consider d/c right drain today or tomorrow. D/c right drain this week if output remains low.   Delayed gastric emptying. Qtc ok.  Continue reglan . TPN. Failed clamping trial again 12/27/22. HOCM - continue beta blockade and treat hypertension. IV metoprolol  to q4h instead of q6 per Cardiology.   Anemia of chronic disease, chronic blood loss anemia, and acute blood loss anemia- S/p 1U PRBC 12/27. On therapeutic lovenox . Hyperglycemia - improving, monitor    Hypophosphatemia - resolved Hypokalemia - resolved. Pharmacy monitoring.  Severe protein calorie malnutrition - TNA. RUE DVT - treatment dose lovenox .  New picc placed opposite side.   OOB, IS, PT consult.  Thrush - nystatin  suspension  FEN - TPN. Cont PPI BID & Carafate  via tube. SABRA   VTE - lovenox  treatment dose - cont. Replace picc on opposite side.   ID - Fluconazole - stopped 1/7, resumed Zosyn  1/3 >> afebrile with normal WBCs.  Switched to ceftriaxone  and flagyl  1/8.  Dispo:  discuss potential g tube with IR to help work toward dispo.  Unsure if insurance would allow TPN at SNF.    LOS: 36 days    12/29/2023, 7:22 AM  Christine LITTIE Nephew, MD, FACS, FSSO Surgical Oncology, General Surgery, Trauma and Critical [facility] Surgery, [place] [phone] for weekday/non holidays Check [email or web address] for coverage night/weekend/holidays

## 2023-12-29 NOTE — Progress Notes (Signed)
 Physical Therapy Treatment Patient Details Name: Christine Morse MRN: [id number] DOB: [birth year] Today's Date: 12/29/2023   History of Present Illness Patient is an 88 y/o female admitted 11/23/23 with diagnosis of duodenal adenoma and underwent laparoscopic Whipple procedure. post-op subcapsular fluid collection around the anterior aspect of the left liver lobe. Drain placement in IR 12/03/23. NGT inserted 12/15, dislodged then replaced.  Positive for R subclavian and axillary DVT on 12/07/23. 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    PT Comments  Pt not feeling well, but asked for OOB and needed min assist for transition to EOB, sit to stand and pivot to the recline.    If plan is discharge home, recommend the following: A little help with walking and/or transfers;A little help with bathing/dressing/bathroom;Assistance with cooking/housework;Assist for transportation   Can travel by private vehicle     Yes  Equipment Recommendations  Rolling walker (2 wheels);BSC/3in1    Recommendations for Other Services       Precautions / Restrictions       Mobility  Bed Mobility Overal bed mobility: Needs Assistance Bed Mobility: Supine to Sit     Supine to sit: Min assist, HOB elevated     General bed mobility comments: pt not feeling well and needed minimal assist    Transfers Overall transfer level: Needs assistance   Transfers: Sit to/from Stand Sit to Stand: Min assist   Step pivot transfers: Min assist       General transfer comment: pt a little more guarded today having been nauseated with vomiting    Ambulation/Gait               General Gait Details: pt declined today   Stairs             Wheelchair Mobility     Tilt Bed    Modified Rankin (Stroke Patients Only)       Balance     Sitting balance-[name]: Good       Standing balance-[name]: Fair Standing balance comment: a little more  unsteady today asking to hold on                            Cognition Arousal: Alert Behavior During Therapy: Flat affect Overall Cognitive Status: No family/caregiver present to determine baseline cognitive functioning                                          Exercises      General Comments        Pertinent Vitals/Pain Pain Assessment Pain Assessment: Faces Faces Pain Scale: Hurts a little bit Pain Location: abdomen Pain Intervention(s): Monitored during session    Home Living                          Prior Function            PT Goals (current goals can now be found in the care plan section) Acute Rehab PT Goals PT Goal Formulation: With patient Time For Goal Achievement: 01/04/24 Potential to Achieve Goals: Fair Progress towards PT goals: Progressing toward goals    Frequency    Min 1X/week      PT Plan      Co-evaluation  AM-PAC PT 6 Clicks Mobility   Outcome Measure  Help needed turning from your back to your side while in a flat bed without using bedrails?: A Little Help needed moving from lying on your back to sitting on the side of a flat bed without using bedrails?: A Little Help needed moving to and from a bed to a chair (including a wheelchair)?: A Little Help needed standing up from a chair using your arms (e.g., wheelchair or bedside chair)?: A Little Help needed to walk in hospital room?: A Little Help needed climbing 3-5 steps with a railing? : A Lot 6 Click Score: 17    End of Session   Activity Tolerance: Patient tolerated treatment well Patient left: in chair;with call bell/phone within reach Nurse Communication: Mobility status PT Visit Diagnosis: Other abnormalities of gait and mobility (R26.89);Pain     Time: 1700-1715 PT Time Calculation (min) (ACUTE ONLY): 15 min  Charges:    $Therapeutic Activity: 8-22 mins PT General Charges $$ ACUTE PT VISIT: 1 Visit                      12/29/2023  [place] HERO., PT Acute Rehabilitation Services [phone]  (office)   Vinie GAILS Kaylaann Mountz 12/29/2023, 7:26 PM

## 2023-12-29 NOTE — Progress Notes (Signed)
 PHARMACY - TOTAL PARENTERAL NUTRITION CONSULT NOTE  Indication:  delayed gastric emptying  Patient Measurements: Height: 4' 10 (147.3 cm) Weight: 47.3 kg (104 lb 4.4 oz) IBW/kg (Calculated) : 40.9 TPN AdjBW (KG): 44.5 Body mass index is 21.79 kg/m. 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  Patient was started on a CLD on 12/6-12/9 and then advanced to a soft diet since 12/10.  Patient has had minimal PO intake since being post-op and continues to experience nausea. Delayed gastric emptying likely related to possible pancreatic leak.  Pharmacy consulted to dose TPN.  Glucose / Insulin : A1c 3.7 (11/15/23) - glucose <180, no SSI ordered Electrolytes: 1/8: Na: 136, K: 4.0, HCO3: 25, CoCa: 10.1, phos: 3.0 , mag: 1.9 Renal: 1/4: SCr < 1, BUN WNL Hepatic: 1/2: LFTs / tbili WNL, Alk phos 209, albumin  2.0 Intake / Output; MIVF: UOP not charted 7+ unmeasured, NGT back to suction -500, drain 275 mL, LBM 12/29 (s/p Reglan  x3 doses 12/11-12/12, restarted 12/15) GI Imaging: 12/07 Ab XR: unremarkable bowel gas pattern. 12/11 CT: possible post-op seroma or hematoma vs abscess, distal colonic diverticulosis 12/15 Ab XR: expected post-op changes, NGT in correct place 12/26- CT ABD- perc drain cath adj left lobe liver- no discernable residual fluid collection. Post whipple w/ no evidence pancreatic ductal dilatation or peripancreatic fluid collection, small amount of free fluid in pelvis 1/2 CT ABD - minimal fluid near drain in abdomen  GI Surgeries / Procedures:  12/3 diagnostic laparoscopy, Whipple procedure, pancreatic duct stent 12/13 subhepatic fluid collection aspiration and drainage 1/3 IR US  drain placement of abdominal abscess   Central access: PICC placed 12/01/23. She pulled the line out 1/5-6. VAT is working on getting another line  placed TPN start date: 12/02/23  Nutritional Goals: Goal TPN rate is 75 mL/hr providing 1739 kcal and 101 g protein meeting 100% of nutritional needs.   RD Estimated Needs Total Energy Estimated Needs: 1700-2000 kcal Total Protein Estimated Needs: 85-100 gm Total Fluid Estimated Needs: 1 ml/kcal  Current Nutrition:  TPN NPO   Plan:  continue TPN to goal rate 75 mL/hr providing 1739 kcal and 101 g protein meeting 100% of nutritional needs.  Electrolytes in TPN: Na 83mEq/L, K 72mEq/L, Ca 48mEq/L, Mg 14mEq/L, and Phos 15mmol/L. Cl:Ac, max acetate.  Monitor TPN labs on Mon/Thurs Ability to advance diet as able.   Benedetta Heath BS, PharmD, BCPS Clinical Pharmacist 12/29/2023 7:25 AM  Contact: [phone] after 3 PM  Be curious, not judgmental... -Davina Sprinkles

## 2023-12-29 NOTE — Progress Notes (Signed)
 Pt NG noted to be clamped during shift change and possible may have been clamped all night. RN assisted pt to BR who voided and had a BM. Pt had an unmeasured emesis x1 which was bile color. Pt report pain, discomfort and nausea afterwards. Prn zofran  and fentanyl  adm. Pt hooked back to LIWS which had 300 ml bilious output. Pt assisted back to bed and resting with call light within reach. P. Amo Amerika Nourse RN

## 2023-12-30 LAB — COMPREHENSIVE METABOLIC PANEL
ALT: 42 U/L (ref 0–44)
AST: 27 U/L (ref 15–41)
Albumin: 2.1 g/dL — ABNORMAL LOW (ref 3.5–5.0)
Alkaline Phosphatase: 150 U/L — ABNORMAL HIGH (ref 38–126)
Anion gap: 6 (ref 5–15)
BUN: 17 mg/dL (ref 8–23)
CO2: 28 mmol/L (ref 22–32)
Calcium: 8.2 mg/dL — ABNORMAL LOW (ref 8.9–10.3)
Chloride: 100 mmol/L (ref 98–111)
Creatinine, Ser: 0.34 mg/dL — ABNORMAL LOW (ref 0.44–1.00)
GFR, Estimated: >60 mL/min (ref >60)
Glucose, Bld: 139 mg/dL — ABNORMAL HIGH (ref 70–99)
Potassium: 4 mmol/L (ref 3.5–5.1)
Sodium: 134 mmol/L — ABNORMAL LOW (ref 135–145)
Total Bilirubin: 0.3 mg/dL (ref 0.0–1.2)
Total Protein: 5.4 g/dL — ABNORMAL LOW (ref 6.5–8.1)

## 2023-12-30 LAB — CBC
HCT: 28.4 % — ABNORMAL LOW (ref 36.0–46.0)
Hemoglobin: 8.9 g/dL — ABNORMAL LOW (ref 12.0–15.0)
MCH: 28.4 pg (ref 26.0–34.0)
MCHC: 31.3 g/dL (ref 30.0–36.0)
MCV: 90.7 fL (ref 80.0–100.0)
Platelets: 474 10*3/uL — ABNORMAL HIGH (ref 150–400)
RBC: 3.13 MIL/uL — ABNORMAL LOW (ref 3.87–5.11)
RDW: 16.5 % — ABNORMAL HIGH (ref 11.5–15.5)
WBC: 9.1 10*3/uL (ref 4.0–10.5)
nRBC: 0 % (ref 0.0–0.2)

## 2023-12-30 LAB — MAGNESIUM: Magnesium: 2 mg/dL (ref 1.7–2.4)

## 2023-12-30 LAB — PHOSPHORUS: Phosphorus: 3.1 mg/dL (ref 2.5–4.6)

## 2023-12-30 MED ORDER — POTASSIUM ACETATE 2 MEQ/ML IV SOLN
INTRAVENOUS | Status: AC
  Filled 2023-12-30: qty 1008

## 2023-12-30 NOTE — Progress Notes (Signed)
 37 Days Post-Op   Subjective/Chief Complaint: No significant change.   Objective: Vital signs in last 24 hours: Temp:  [97.9 F (36.6 C)-98.8 F (37.1 C)] 98 F (36.7 C) (01/09 1126) Pulse Rate:  [78-90] 90 (01/09 1126) Resp:  [16-19] 16 (01/09 1126) BP: (140-180)/(50-80) 145/69 (01/09 1126) SpO2:  [98 %-100 %] 100 % (01/09 1126) Last BM Date : 12/28/22  Intake/Output from previous day: 01/08 0701 - 01/09 0700 In: 2416.3 [I.V.:1950; IV Piggyback:466.3] Out: 1025 [Emesis/NG output:750; Drains:275] Intake/Output this shift: Total I/O In: 20 [I.V.:20] Out: -   Physical Exam:  General appearance: OOB in chair.  Resp: breathing comfortably GI: soft, non distended. Dressing moist, getting changed 2-0 times/day. No rigidity or guarding. +BS. Midline wound with staples out, dressing in place without staining             D1 - LLQ - 275 cc -purulent appearing             D2 RLQ - 0 cc - scant output, purulent Extremities: No LE edema  Lab Results:  Recent Labs    12/29/23 0526 12/30/23 0430  WBC 8.9 9.1  HGB 9.6* 8.9*  HCT 31.1* 28.4*  PLT 519* 474*    BMET Recent Labs    12/29/23 0526 12/30/23 0430  NA 136 134*  K 4.0 4.0  CL 100 100  CO2 25 28  GLUCOSE 136* 139*  BUN 19 17  CREATININE 0.56 0.34*  CALCIUM  8.5* 8.2*   PT/INR No results for input(s): LABPROT, INR in the last 72 hours.   ABG No results for input(s): PHART, HCO3 in the last 72 hours.  Invalid input(s): PCO2, PO2   Studies/Results:  Anti-infectives: Anti-infectives (From admission, onward)    Start     Dose/Rate Route Frequency Ordered Stop   12/29/23 1630  cefTRIAXone  (ROCEPHIN ) 2 g in sodium chloride  0.9 % 100 mL IVPB       Note to Pharmacy: Pharmacy may adjust dosing strength / duration / interval for maximal efficacy   2 g 200 mL/hr over 30 Minutes Intravenous Every 24 hours 12/29/23 1536     12/29/23 1630  metroNIDAZOLE  (FLAGYL ) IVPB 500 mg        500 mg 100 mL/hr  over 60 Minutes Intravenous Every 12 hours 12/29/23 1536     12/24/23 1000  piperacillin -tazobactam (ZOSYN ) IVPB 3.375 g  Status:  Discontinued        3.375 g 12.5 mL/hr over 240 Minutes Intravenous Every 8 hours 12/24/23 0913 12/29/23 1536   12/13/23 1700  piperacillin -tazobactam (ZOSYN ) IVPB 3.375 g        3.375 g 12.5 mL/hr over 240 Minutes Intravenous Every 8 hours 12/13/23 1412 12/13/23 2139   11/30/23 1230  fluconazole  (DIFLUCAN ) IVPB 400 mg  Status:  Discontinued        400 mg 100 mL/hr over 120 Minutes Intravenous Every 24 hours 11/30/23 1135 12/28/23 1321   11/26/23 0945  piperacillin -tazobactam (ZOSYN ) IVPB 3.375 g  Status:  Discontinued        3.375 g 12.5 mL/hr over 240 Minutes Intravenous Every 8 hours 11/26/23 0853 12/13/23 1412   11/23/23 2100  ceFAZolin  (ANCEF ) IVPB 2g/100 mL premix        2 g 200 mL/hr over 30 Minutes Intravenous Every 8 hours 11/23/23 1654 11/23/23 2214   11/23/23 0700  ceFAZolin  (ANCEF ) IVPB 2g/100 mL premix        2 g 200 mL/hr over 30 Minutes Intravenous On call to O.R.  11/23/23 0654 11/23/23 1249       Assessment/Plan: Ms. Slates is an 88 year old female status post Whipple Procedure on 11/23/2023, (POD 37).  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 Rare candida seen on final culture. Completed Zosyn . Fluc stopped yesterday as repeat cx negative for candida. Repeat CT 1/2 with abdominal wall collection now s/p IR aspiration 1/3 and bedside I&D 1/4.  Zosyn  restarted 1/3 - stopped 1/8 and switched to ceftriaxone /flagyl  based on cx of citrobacter and b frag.   Pancreatic leak - some leakage via abd wound.  IR drain removed. Drain output overall stable. If no output from right drain tomorrow, will d/c tomorrow. Delayed gastric emptying. Qtc ok.  Continue reglan . TPN. Failed clamping trial again  12/27/22. HOCM - Continue beta blockade and treat hypertension. IV metoprolol  to q4h instead of q6 per Cardiology.   Anemia of chronic disease, chronic blood loss anemia, and acute blood loss anemia- S/p 1U PRBC 12/27. On therapeutic lovenox . Hyperglycemia - improving, monitor   Hypophosphatemia - resolved Hypokalemia - resolved. Pharmacy monitoring.  Severe protein calorie malnutrition - TNA. RUE DVT - treatment dose lovenox .  New picc placed opposite side.   OOB, IS, PT consult.  Thrush - nystatin  suspension  FEN - TPN. Cont PPI BID & Carafate  via tube. SABRA   VTE - lovenox  treatment dose - cont. Replace picc on opposite side.   ID - Fluconazole - stopped 1/7, resumed Zosyn  1/3 >> afebrile with normal WBCs.  Switched to ceftriaxone  and flagyl  1/8.  Dispo:  Discussing potential g tube with IR to help work toward dispo. They are reviewing.  Unsure if insurance would allow TPN at SNF.    LOS: 37 days    12/30/2023, 12:03 PM  Jina LITTIE Nephew, MD, FACS, FSSO Surgical Oncology, General Surgery, Trauma and Critical [facility] Surgery, [place] [phone] for weekday/non holidays Check [email or web address] for coverage night/weekend/holidays

## 2023-12-30 NOTE — Plan of Care (Signed)
 Spoke with patient's daughters, Christine Morse and Christine Morse, regarding their understanding of the plan of care. Both shared their understanding of the conversation with Dr. Aron regarding the goal to remove the NGT and possibly place a G-tube with IR. They mentioned that they had not discussed the code status since before the surgery. They confirmed that Christine Morse has a living will and were reminded to bring it in. Neither daughter has reviewed the living will to know what the patient's goals for code status are; however, Christine Morse did state that it was addressed in the living will. Christine Morse inquired about the patient's life expectancy, and I indicated that this was a conversation to have with Dr. Aron. I offered both daughters information regarding palliative care services and what they could offer in terms of advanced care planning. They will discuss and inform us  if they would like us  to advocate for this consult with Dr. Aron.

## 2023-12-30 NOTE — Progress Notes (Signed)
 OT Cancellation Note  Patient Details Name: [name] MRN: [id number] DOB: 05-06-1936   Cancelled Treatment:    Reason Eval/Treat Not Completed: Patient was up in the recliner, just returned to bed, sleeping soundly.    Keerat Denicola D Jozef Eisenbeis 12/30/2023, 2:38 PM 12/30/2023  RP, OTR/L  Acute Rehabilitation Services  Office:  [phone]

## 2023-12-30 NOTE — Progress Notes (Addendum)
 PHARMACY - TOTAL PARENTERAL NUTRITION CONSULT NOTE  Indication:  delayed gastric emptying  Patient Measurements: Height: 4' 10 (147.3 cm) Weight: 47.3 kg (104 lb 4.4 oz) IBW/kg (Calculated) : 40.9 TPN AdjBW (KG): 44.5 Body mass index is 21.79 kg/m. 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  Patient was started on a CLD on 12/6-12/9 and then advanced to a soft diet since 12/10.  Patient has had minimal PO intake since being post-op and continues to experience nausea. Delayed gastric emptying likely related to possible pancreatic leak.  Pharmacy consulted to dose TPN.  Glucose / Insulin : A1c 3.7 (11/15/23) - glucose <180, no SSI ordered Electrolytes: 1/9: Na: 134, K: 4.0, HCO3: 28, CoCa: 9.72, phos: 3.1 , mag: 2.0 Renal: 1/9: SCr < 1, BUN WNL Hepatic: 1/9: LFTs / tbili WNL, Alk phos 150, albumin  2.1 Intake / Output; MIVF: UOP not charted 6+ unmeasured, NGT back to suction -750, drain 275 mL, LBM 1/8 (s/p Reglan  x3 doses 12/11-12/12, restarted 12/15) GI Imaging: 12/07 Ab XR: unremarkable bowel gas pattern. 12/11 CT: possible post-op seroma or hematoma vs abscess, distal colonic diverticulosis 12/15 Ab XR: expected post-op changes, NGT in correct place 12/26- CT ABD- perc drain cath adj left lobe liver- no discernable residual fluid collection. Post whipple w/ no evidence pancreatic ductal dilatation or peripancreatic fluid collection, small amount of free fluid in pelvis 1/2 CT ABD - minimal fluid near drain in abdomen  GI Surgeries / Procedures:  12/3 diagnostic laparoscopy, Whipple procedure, pancreatic duct stent 12/13 subhepatic fluid collection aspiration and drainage 1/3 IR US  drain placement of abdominal abscess   Central access: PICC placed 12/01/23. She pulled the line out 1/5-6. VAT is working on getting another line placed TPN  start date: 12/02/23  Nutritional Goals: Goal TPN rate is 75 mL/hr providing 1739 kcal and 101 g protein meeting 100% of nutritional needs.   RD Estimated Needs Total Energy Estimated Needs: 1700-2000 kcal Total Protein Estimated Needs: 85-100 gm Total Fluid Estimated Needs: 1 ml/kcal  Current Nutrition:  TPN NPO   Plan:  continue TPN to goal rate 75 mL/hr providing 1739 kcal and 101 g protein meeting 100% of nutritional needs.  Electrolytes in TPN: Na 67mEq/L, K 8mEq/L, Ca 27mEq/L, Mg 42mEq/L, and Phos 15mmol/L. Cl:Ac, max acetate.  Bmet 12/31/23 Monitor TPN labs on Mon/Thurs Ability to advance diet as able.   Benedetta Heath BS, PharmD, BCPS Clinical Pharmacist 12/30/2023 7:52 AM  Contact: [phone] after 3 PM  Be curious, not judgmental... -Davina Sprinkles

## 2023-12-30 NOTE — TOC Progression Note (Signed)
 Transition of Care [facility]) - Progression Note    Patient Details  Name: Christine Morse MRN: [id number] Date of Birth: January 14, 1936  Transition of [facility]) CM/SW Contact  Montie LOISE Louder, [place] Phone Number: 12/30/2023, 10:49 AM  Clinical Narrative:      TOC continues to follow and will assist with discharge planning.  Montie Louder, MSW, LCSW Clinical Social Worker     Barriers to Discharge: Continued Medical Work up  Expected Discharge Plan and Services In-house Referral: Clinical Social Work   Post Acute Care Choice: Skilled Nursing Facility Living arrangements for the past 2 months: Single Family Home                                       Social Determinants of Health (SDOH) Interventions SDOH Screenings   Food Insecurity: No Food Insecurity (11/23/2023)  Housing: Patient Declined (11/23/2023)  Transportation Needs: No Transportation Needs (11/23/2023)  Utilities: Not At Risk (11/23/2023)  Social Connections: Unknown (12/21/2023)  Tobacco Use: Low Risk  (11/23/2023)  Recent Concern: Tobacco Use - Medium Risk (11/07/2023)    Readmission Risk Interventions    10/27/2023    9:03 AM  Readmission Risk Prevention Plan  Transportation Screening Complete  PCP or Specialist Appt within 5-7 Days Complete  Home Care Screening Complete  Medication Review (RN CM) Complete

## 2023-12-30 NOTE — Progress Notes (Deleted)
 Cardiology Office Note:    Date:  12/30/2023   ID:  Christine Morse, DOB 05-Nov-1936, MRN [id number]  PCP:  [name], MD   [facility] Providers Cardiologist:  Rollene Rotunda, MD { Click to update primary MD,subspecialty MD or APP then REFRESH:1}    Referring MD: [name], MD   No chief complaint on file. ***  History of Present Illness:    Christine Morse is a 88 y.o. female with a hx of duodenal adenoma cancer who was admitted 11/23/23 for Whipple procedure.        Past Medical History:  Diagnosis Date   Anemia    Arthritis    Cyst of ovary, right 2013   "/US" (10/04/2013)   Diverticulitis    Diverticulosis    Duodenal adenoma    GERD (gastroesophageal reflux disease)    History of blood transfusion    "just today; my HgB is 5.1" (10/04/2013)   History of gastric ulcer    History of hiatal hernia    HOCM (hypertrophic obstructive cardiomyopathy) (HCC) 10/24/2023   Hyperlipidemia    Hypertension    Mitral regurgitation    moderate MR 10/24/23   Nephrolithiasis    "passed them on my own; went away after I quit drinking tea" (10/04/2013)   Osteoporosis    Vitamin D deficiency     Past Surgical History:  Procedure Laterality Date   ABDOMINAL HYSTERECTOMY  1981   BIOPSY  10/25/2023   Procedure: BIOPSY;  Surgeon: Hilarie Fredrickson, MD;  Location: [facility];  Service: Gastroenterology;;   ESOPHAGOGASTRODUODENOSCOPY N/A 10/04/2013   Procedure: ESOPHAGOGASTRODUODENOSCOPY (EGD);  Surgeon: Hart Carwin, MD;  Location: [facility];  Service: Endoscopy;  Laterality: N/A;   ESOPHAGOGASTRODUODENOSCOPY (EGD) WITH PROPOFOL N/A 10/25/2023   Procedure: ESOPHAGOGASTRODUODENOSCOPY (EGD) WITH PROPOFOL;  Surgeon: Hilarie Fredrickson, MD;  Location: [facility] ENDOSCOPY;  Service: Gastroenterology;  Laterality: N/A;   IR US GUIDE BX ASP/DRAIN  12/24/2023   LAPAROSCOPY N/A 11/23/2023   Procedure: LAPAROSCOPY DIAGNOSTIC;  Surgeon: Almond Lint, MD;  Location: [facility] OR;   Service: General;  Laterality: N/A;  8 HOURS ROOM 10   LEFT OOPHORECTOMY Left 1981   MALONEY DILATION N/A 10/04/2013   Procedure: Elease Hashimoto DILATION;  Surgeon: Hart Carwin, MD;  Location: [facility] END[facility]COPY;  Service: Endoscopy;  Laterality: N/A;   WHIPPLE PROCEDURE N/A 11/23/2023   Procedure: WHIPPLE PROCEDURE;  Surgeon: Almond Lint, MD;  Location: [facility] OR;  Service: General;  Laterality: N/A;    Current Medications: No outpatient medications have been marked as taking for the 01/06/24 encounter (Appointment) with Marcelino Duster, PA.     Allergies:   Codeine   Social History   Socioeconomic History   Marital status: Widowed    Spouse name: Not on file   Number of children: 2   Years of education: Not on file   Highest education level: Not on file  Occupational History   Occupation: Retired  Tobacco Use   Smoking status: Never   Smokeless tobacco: Never  Vaping Use   Vaping status: Never Used  Substance and Sexual Activity   Alcohol use: No   Drug use: No   Sexual activity: Never  Other Topics Concern   Not on file  Social History Narrative   Lives at home with granddaughter she raised.     Social Drivers of Corporate investment banker Strain: Not on file  Food Insecurity: No Food Insecurity (11/23/2023)   Hunger Vital Sign  Worried About Programme researcher, broadcasting/film/video in the Last Year: Never true    Ran Out of Food in the Last Year: Never true  Transportation Needs: No Transportation Needs (11/23/2023)   PRAPARE - Administrator, Civil Service (Medical): No    Lack of Transportation (Non-Medical): No  Physical Activity: Not on file  Stress: Not on file  Social Connections: Unknown (12/21/2023)   Social Connection and Isolation Panel [NHANES]    Frequency of Communication with Friends and Family: Patient declined    Frequency of Social Gatherings with Friends and Family: Patient declined    Attends Religious Services: Not on Marketing executive or  Organizations: Not on file    Attends Banker Meetings: Patient declined    Marital Status: Patient declined     Family History: The patient's ***family history includes Cancer in her brother; Dementia in her father; Heart failure (age of onset: 74) in her mother; Ovarian cancer in her sister. There is no history of Colon cancer.  ROS:   Please see the history of present illness.    *** All other systems reviewed and are negative.  EKGs/Labs/Other Studies Reviewed:    The following studies were reviewed today: ***      Recent Labs: 11/07/2023: B Natriuretic Peptide 394.2 12/30/2023: ALT 42; BUN 17; Creatinine, Ser 0.34; Hemoglobin 8.9; Magnesium 2.0; Platelets 474; Potassium 4.0; Sodium 134  Recent Lipid Panel    Component Value Date/Time   TRIG 91 12/27/2023 0430     Risk Assessment/Calculations:   {Does this patient have ATRIAL FIBRILLATION?:[phone]}            Physical Exam:    VS:  There were no vitals taken for this visit.    Wt Readings from Last 3 Encounters:  12/29/23 104 lb 4.4 oz (47.3 kg)  11/15/23 122 lb 1.6 oz (55.4 kg)  11/11/23 123 lb 3.2 oz (55.9 kg)     GEN: *** Well nourished, well developed in no acute distress HEENT: Normal NECK: No JVD; No carotid bruits LYMPHATICS: No lymphadenopathy CARDIAC: ***RRR, no murmurs, rubs, gallops RESPIRATORY:  Clear to auscultation without rales, wheezing or rhonchi  ABDOMEN: Soft, non-tender, non-distended MUSCULOSKELETAL:  No edema; No deformity  SKIN: Warm and dry NEUROLOGIC:  Alert and oriented x 3 PSYCHIATRIC:  Normal affect   ASSESSMENT:    No diagnosis found. PLAN:    In order of problems listed above:  ***      {Are you ordering a CV Procedure (e.g. stress test, cath, DCCV, TEE, etc)?   Press F2        :[id number]}    Medication Adjustments/Labs and Tests Ordered: Current medicines are reviewed at length with the patient today.  Concerns regarding medicines are outlined  above.  No orders of the defined types were placed in this encounter.  No orders of the defined types were placed in this encounter.   There are no Patient Instructions on file for this visit.   Signed, Marcelino Duster, [place]  12/30/2023 10:53 AM    [facility]

## 2023-12-30 NOTE — Plan of Care (Signed)
   Problem: Activity: Goal: Risk for activity intolerance will decrease Outcome: Progressing   Problem: Coping: Goal: Level of anxiety will decrease Outcome: Progressing   Problem: Safety: Goal: Ability to remain free from injury will improve Outcome: Progressing

## 2023-12-31 LAB — BASIC METABOLIC PANEL
Anion gap: 7 (ref 5–15)
BUN: 15 mg/dL (ref 8–23)
CO2: 26 mmol/L (ref 22–32)
Calcium: 8.3 mg/dL — ABNORMAL LOW (ref 8.9–10.3)
Chloride: 100 mmol/L (ref 98–111)
Creatinine, Ser: <0.3 mg/dL — ABNORMAL LOW (ref 0.44–1.00)
Glucose, Bld: 131 mg/dL — ABNORMAL HIGH (ref 70–99)
Potassium: 4.1 mmol/L (ref 3.5–5.1)
Sodium: 133 mmol/L — ABNORMAL LOW (ref 135–145)

## 2023-12-31 MED ORDER — TRAVASOL 10 % IV SOLN
INTRAVENOUS | Status: AC
  Filled 2023-12-31: qty 1008

## 2023-12-31 NOTE — Progress Notes (Signed)
 Patient pulled out NG tube this morning. Dr. Derrell Lolling notified. Ordered to leave NG out for now. Patient not experiencing any nausea or vomiting at the time.

## 2023-12-31 NOTE — Progress Notes (Signed)
 PHARMACY - TOTAL PARENTERAL NUTRITION CONSULT NOTE  Indication:  delayed gastric emptying  Patient Measurements: Height: 4' 10 (147.3 cm) Weight: 47.3 kg (104 lb 4.4 oz) IBW/kg (Calculated) : 40.9 TPN AdjBW (KG): 44.5 Body mass index is 21.79 kg/m. 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  Patient was started on a CLD on 12/6-12/9 and then advanced to a soft diet since 12/10.  Patient has had minimal PO intake since being post-op and continues to experience nausea. Delayed gastric emptying likely related to possible pancreatic leak.  Pharmacy consulted to dose TPN.  Glucose / Insulin : A1c 3.7 (11/15/23) - glucose <180, no SSI ordered Electrolytes: 1/10: Na: 133, K: 4.1, HCO3: 26, CoCa: 9.82, phos: 3.1 , mag: 2.0 Renal: 1/9: SCr < 1, BUN WNL Hepatic: 1/9: LFTs / tbili WNL, Alk phos 150, albumin  2.1 Intake / Output; MIVF: UOP not charted 5+ unmeasured, NGT back to suction -450, drain 225 mL, LBM 1/8 (s/p Reglan  x3 doses 12/11-12/12, restarted 12/15) GI Imaging: 12/07 Ab XR: unremarkable bowel gas pattern. 12/11 CT: possible post-op seroma or hematoma vs abscess, distal colonic diverticulosis 12/15 Ab XR: expected post-op changes, NGT in correct place 12/26- CT ABD- perc drain cath adj left lobe liver- no discernable residual fluid collection. Post whipple w/ no evidence pancreatic ductal dilatation or peripancreatic fluid collection, small amount of free fluid in pelvis 1/2 CT ABD - minimal fluid near drain in abdomen  GI Surgeries / Procedures:  12/3 diagnostic laparoscopy, Whipple procedure, pancreatic duct stent 12/13 subhepatic fluid collection aspiration and drainage 1/3 IR US  drain placement of abdominal abscess   Central access: PICC replaced 12/27/23 TPN start date: 12/02/23  Nutritional Goals: Goal TPN rate is 75 mL/hr  providing 1739 kcal and 101 g protein meeting 100% of nutritional needs.   RD Estimated Needs Total Energy Estimated Needs: 1700-2000 kcal Total Protein Estimated Needs: 85-100 gm Total Fluid Estimated Needs: 1 ml/kcal  Current Nutrition:  TPN NPO   Plan:  continue TPN to goal rate 75 mL/hr providing 1739 kcal and 101 g protein meeting 100% of nutritional needs.  Electrolytes in TPN: Na , 10mEq/L, Ca 86mEq/L, Mg 22mEq/L, and Phos 15mmol/L. Cl:Ac, max acetate.  Bmet 01/01/24 Monitor TPN labs on Mon/Thurs Ability to advance diet as able.   Benedetta Heath BS, PharmD, BCPS Clinical Pharmacist 12/31/2023 7:19 AM  Contact: [phone] after 3 PM  Be curious, not judgmental... -Davina Sprinkles

## 2023-12-31 NOTE — Progress Notes (Signed)
 PHARMACY - TOTAL PARENTERAL NUTRITION CONSULT NOTE  Indication:  delayed gastric emptying  Patient Measurements: Height: 4' 10 (147.3 cm) Weight: 47.3 kg (104 lb 4.4 oz) IBW/kg (Calculated) : 40.9 TPN AdjBW (KG): 44.5 Body mass index is 21.79 kg/m. 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  Patient was started on a CLD on 12/6-12/9 and then advanced to a soft diet since 12/10.  Patient has had minimal PO intake since being post-op and continues to experience nausea. Delayed gastric emptying likely related to possible pancreatic leak.  Pharmacy consulted to dose TPN.  Glucose / Insulin : A1c 3.7 (11/15/23) - glucose <180, no SSI Electrolytes: all WNL Renal: SCr < 1, BUN WNL Hepatic: AST/ALT/tbili WNL, alk phos down to 150, albumin  2.1 Intake / Output; MIVF: UOP 0.3 ml/kg/hr, NGT , drain , LBM 1/10 (s/p Reglan  x3 doses 12/11-12/12, restarted 12/15), emesis x2, net +12.7L GI Imaging: 12/7 XR: unremarkable bowel gas pattern. 12/11 CT: possible post-op seroma or hematoma vs abscess, distal colonic diverticulosis 12/15 XR: expected post-op changes, NGT in correct place 12/26 CT: perc drain cath adj left lobe liver, no discernable residual fluid collection. Post whipple w/ no evidence pancreatic ductal dilatation or peripancreatic fluid collection, small amount of free fluid in pelvis 1/2 CT - minimal fluid near drain in abdomen  GI Surgeries / Procedures:  12/3 diagnostic laparoscopy, Whipple procedure, pancreatic duct stent 12/13 subhepatic fluid collection aspiration and drainage 1/3 IR US  drain placement of abdominal abscess   Central access: PICC replaced 12/27/23 TPN start date: 12/02/23  Nutritional Goals: RD Estimated Needs Total Energy Estimated Needs: 1700-2000 kcal Total Protein Estimated Needs: 85-100 gm Total Fluid  Estimated Needs: 1 ml/kcal  Current Nutrition:  TPN  Plan:  Continue TPN to goal rate 75 mL/hr to provide 1732 kCal and 99g AA, meeting 100% of nutritional needs.  Electrolytes in TPN: Na , 31mEq/L, Ca 13mEq/L, Mg 50mEq/L, Phos 66mmol/L, change to Cl:Ac 1:2 Monitor TPN labs on Mon/Thurs - labs on Mon Consider cycling TPN  Monitor volume status F/u GoC, decision for G-tube   Jakeob Tullis D. Lendell, PharmD, BCPS, BCCCP 01/01/2024, 7:14 AM

## 2023-12-31 NOTE — Progress Notes (Signed)
 38 Days Post-Op   Subjective/Chief Complaint: The tube came out overnight.  Question whether this was patient confusion related or simply positioning problems.  Patient feels so-so.  Objective: Vital signs in last 24 hours: Temp:  [97.5 F (36.4 C)-98.1 F (36.7 C)] 97.5 F (36.4 C) (01/10 1128) Pulse Rate:  [78-90] 90 (01/10 1128) Resp:  [16-20] 17 (01/10 1128) BP: (141-166)/(56-85) 145/56 (01/10 1128) SpO2:  [95 %-100 %] 100 % (01/10 1128) Last BM Date : 12/28/22  Intake/Output from previous day: 01/09 0701 - 01/10 0700 In: 30 [I.V.:30] Out: 675 [Emesis/NG output:450; Drains:225] Intake/Output this shift: No intake/output data recorded.  Physical Exam:  General appearance: OOB in chair.  Continues to look very weak, but is conversant and makes sense. Resp: breathing comfortably GI: soft, non distended. No rigidity or guarding.Dressing moist with pancreatic fluid. Midline wound with staples out.             D1 - LLQ - 225 cc -purulent appearing             D2 RLQ - 0 cc - scant output, purulent Extremities: tr LE edema  Lab Results:  Recent Labs    12/29/23 0526 12/30/23 0430  WBC 8.9 9.1  HGB 9.6* 8.9*  HCT 31.1* 28.4*  PLT 519* 474*    BMET Recent Labs    12/30/23 0430 12/31/23 0502  NA 134* 133*  K 4.0 4.1  CL 100 100  CO2 28 26  GLUCOSE 139* 131*  BUN 17 15  CREATININE 0.34* <0.30*  CALCIUM  8.2* 8.3*   PT/INR No results for input(s): LABPROT, INR in the last 72 hours.   ABG No results for input(s): PHART, HCO3 in the last 72 hours.  Invalid input(s): PCO2, PO2   Studies/Results:  Anti-infectives: Anti-infectives (From admission, onward)    Start     Dose/Rate Route Frequency Ordered Stop   12/29/23 1630  cefTRIAXone  (ROCEPHIN ) 2 g in sodium chloride  0.9 % 100 mL IVPB       Note to Pharmacy: Pharmacy may adjust dosing strength / duration / interval for maximal efficacy   2 g 200 mL/hr over 30 Minutes Intravenous Every 24  hours 12/29/23 1536     12/29/23 1630  metroNIDAZOLE  (FLAGYL ) IVPB 500 mg        500 mg 100 mL/hr over 60 Minutes Intravenous Every 12 hours 12/29/23 1536     12/24/23 1000  piperacillin -tazobactam (ZOSYN ) IVPB 3.375 g  Status:  Discontinued        3.375 g 12.5 mL/hr over 240 Minutes Intravenous Every 8 hours 12/24/23 0913 12/29/23 1536   12/13/23 1700  piperacillin -tazobactam (ZOSYN ) IVPB 3.375 g        3.375 g 12.5 mL/hr over 240 Minutes Intravenous Every 8 hours 12/13/23 1412 12/13/23 2139   11/30/23 1230  fluconazole  (DIFLUCAN ) IVPB 400 mg  Status:  Discontinued        400 mg 100 mL/hr over 120 Minutes Intravenous Every 24 hours 11/30/23 1135 12/28/23 1321   11/26/23 0945  piperacillin -tazobactam (ZOSYN ) IVPB 3.375 g  Status:  Discontinued        3.375 g 12.5 mL/hr over 240 Minutes Intravenous Every 8 hours 11/26/23 0853 12/13/23 1412   11/23/23 2100  ceFAZolin  (ANCEF ) IVPB 2g/100 mL premix        2 g 200 mL/hr over 30 Minutes Intravenous Every 8 hours 11/23/23 1654 11/23/23 2214   11/23/23 0700  ceFAZolin  (ANCEF ) IVPB 2g/100 mL premix  2 g 200 mL/hr over 30 Minutes Intravenous On call to O.R. 11/23/23 0654 11/23/23 1249       Assessment/Plan: Ms. Pharo is an 88 year old female status post Whipple Procedure on 11/23/2023, (POD 38).  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 Rare candida seen on final culture. Completed Zosyn . Fluc stopped yesterday as repeat cx negative for candida. Repeat CT 1/2 with abdominal wall collection now s/p IR aspiration 1/3 and bedside I&D 1/4.  Zosyn  restarted 1/3 - stopped 1/8 and switched to ceftriaxone /flagyl  based on cx of citrobacter and b frag.   Pancreatic leak - some leakage via abd wound.  IR drain removed. Right drain no longer draining, so will remove.  Continue left sided drain  and dressing changes.   Delayed gastric emptying. Qtc ok.  Continue reglan . TPN. Failed clamping trial again 12/27/22. NGT out accidentally 1/9.  Replace when nausea recurs.  Have discussed g tube with IR>  HOCM - Continue beta blockade and treat hypertension. IV metoprolol  q4h per Cardiology.   Anemia of chronic disease, chronic blood loss anemia, and acute blood loss anemia- S/p 1U PRBC 12/27.  Hyperglycemia - improving, monitor   Hypophosphatemia - resolved Hypokalemia - resolved. Pharmacy monitoring.  Severe protein calorie malnutrition - TNA. RUE DVT - treatment dose lovenox .  New picc placed opposite side.   OOB, IS, PT consult.  Thrush - nystatin  suspension  FEN - TPN. Cont PPI BID & Carafate  via tube. SABRA   VTE - lovenox  treatment dose - cont. Right sided picc out.   ID - Fluconazole - stopped 1/7, resumed Zosyn  1/3 >> afebrile with normal WBCs.  Switched to ceftriaxone  and flagyl  1/8.  Dispo: Will get palliative care consult to help with symptom management and goals of care.  Primary issue at this point is delayed gastric emptying as well as the pancreatic leak.  Hopefully we can get a percutaneous G-tube placed in order to minimize her need for nasogastric tube.  This would allow her to clamp it or open to drain which ever she needs.  She would not need to be tethered to wall suction.  Hopefully this can help her get out of the hospital to either skilled nursing facility, LTAC, or home.  At this point, she will continue to need the TPN for now due to the inability to take p.o.  IR may attempt to do a gastrojejunostomy tube which might allow us  to start tube feeds, but sometimes this is fraught with difficulty as the J-tube port can migrate proximally.  We will address this when we get to this point.   LOS: 38 days    12/31/2023, 1:22 PM  Jina LITTIE Nephew, MD, FACS, FSSO Surgical Oncology, General Surgery, Trauma and Critical [facility], [place] [phone] for weekday/non  holidays Check [email or web address] for coverage night/weekend/holidays

## 2023-12-31 NOTE — Plan of Care (Signed)
  Problem: Education: Goal: Knowledge of General Education information will improve Description: Including pain rating scale, medication(s)/side effects and non-pharmacologic comfort measures Outcome: Progressing   Problem: Health Behavior/Discharge Planning: Goal: Ability to manage health-related needs will improve Outcome: Progressing   Problem: Clinical Measurements: Goal: Ability to maintain clinical measurements within normal limits will improve Outcome: Progressing Goal: Will remain free from infection Outcome: Progressing Goal: Diagnostic test results will improve Outcome: Progressing Goal: Cardiovascular complication will be avoided Outcome: Progressing   Problem: Activity: Goal: Risk for activity intolerance will decrease Outcome: Progressing   Problem: Nutrition: Goal: Adequate nutrition will be maintained Outcome: Progressing   Problem: Coping: Goal: Level of anxiety will decrease Outcome: Progressing   Problem: Elimination: Goal: Will not experience complications related to bowel motility Outcome: Progressing   Problem: Pain Management: Goal: General experience of comfort will improve Outcome: Progressing   Problem: Safety: Goal: Ability to remain free from injury will improve Outcome: Progressing   Problem: Skin Integrity: Goal: Risk for impaired skin integrity will decrease Outcome: Progressing   Problem: Education: Goal: Ability to describe self-care measures that may prevent or decrease complications (Diabetes Survival Skills Education) will improve Outcome: Progressing Goal: Individualized Educational Video(s) Outcome: Progressing   Problem: Coping: Goal: Ability to adjust to condition or change in health will improve Outcome: Progressing   Problem: Fluid Volume: Goal: Ability to maintain a balanced intake and output will improve Outcome: Progressing   Problem: Health Behavior/Discharge Planning: Goal: Ability to identify and utilize  available resources and services will improve Outcome: Progressing Goal: Ability to manage health-related needs will improve Outcome: Progressing   Problem: Metabolic: Goal: Ability to maintain appropriate glucose levels will improve Outcome: Progressing   Problem: Nutritional: Goal: Maintenance of adequate nutrition will improve Outcome: Progressing Goal: Progress toward achieving an optimal weight will improve Outcome: Progressing   Problem: Skin Integrity: Goal: Risk for impaired skin integrity will decrease Outcome: Progressing   Problem: Education: Goal: Required Educational Video(s) Outcome: Progressing   Problem: Clinical Measurements: Goal: Ability to maintain clinical measurements within normal limits will improve Outcome: Progressing Goal: Postoperative complications will be avoided or minimized Outcome: Progressing   Problem: Skin Integrity: Goal: Demonstration of wound healing without infection will improve Outcome: Progressing

## 2023-12-31 NOTE — Progress Notes (Signed)
 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 post-op subcapsular fluid collection around the anterior aspect of the left liver lobe. Drain placement in IR 12/03/23. NGT inserted 12/15, dislodged then replaced.  Positive for R subclavian and axillary DVT on 12/07/23. 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    PT Comments  Pt up in chair upon arrival to room, requests back to bed given fatigue. PT encouraged pt to at least mobilize around the room, tolerated x20 ft gait with RW and overall requiring light PT assist for steadying and assist back into bed. VSS. Will continue to follow.     If plan is discharge home, recommend the following: A little help with walking and/or transfers;A little help with bathing/dressing/bathroom;Assistance with cooking/housework;Assist for transportation   Can travel by private vehicle        Equipment Recommendations  Rolling walker (2 wheels);BSC/3in1    Recommendations for Other Services       Precautions / Restrictions Precautions Precautions: Fall Precaution Comments: x2 blake drains, x1 JP drain, NG tube pulled out 1/9 evening Restrictions Weight Bearing Restrictions Per Provider Order: No     Mobility  Bed Mobility Overal bed mobility: Needs Assistance Bed Mobility: Sit to Sidelying, Rolling Rolling: Min assist       Sit to sidelying: Min assist General bed mobility comments: up in chair upon PT arrival to room, assist for trunk lower and roll to back    Transfers Overall transfer level: Needs assistance Equipment used: Rolling walker (2 wheels) Transfers: Sit to/from Stand Sit to Stand: Min assist           General transfer comment: light rise and steady assist     Ambulation/Gait Ambulation/Gait assistance: Min assist Gait Distance (Feet): 20 Feet Assistive device: Rolling walker (2 wheels) Gait Pattern/deviations: Step-through pattern, Decreased stride length, Trunk flexed Gait velocity: decr     General Gait Details: assist for steadying and managing RW around room   Stairs             Wheelchair Mobility     Tilt Bed    Modified Rankin (Stroke Patients Only)       Balance Overall balance assessment: Needs assistance Sitting-balance support: Feet supported, No upper extremity supported Sitting balance-[name]: Good     Standing balance support: Bilateral upper extremity supported, During functional activity, No upper extremity supported Standing balance-[name]: Fair Standing balance comment: RW for support during dynamic activity                            Cognition Arousal: Alert Behavior During Therapy: Flat affect Overall Cognitive Status: No family/caregiver present to determine baseline cognitive functioning                                 General Comments: pt fatigued, eye closing on and off throughout session        Exercises General Exercises - Lower Extremity Ankle Circles/Pumps: AROM, Both, 10 reps, Supine Heel Slides: AROM, Both, 10 reps, Supine    General Comments General comments (skin integrity, edema, etc.): vss      Pertinent Vitals/Pain Pain Assessment Pain Assessment: Faces Faces Pain Scale:  Hurts a little bit Pain Location: abdomen Pain Descriptors / Indicators: Discomfort, Guarding Pain Intervention(s): Limited activity within patient's tolerance, Monitored during session, Repositioned    Home Living                          Prior Function            PT Goals (current goals can now be found in the care plan section) Acute Rehab PT Goals PT Goal Formulation: With patient Time For Goal Achievement: 01/04/24 Potential to Achieve  Goals: Fair Progress towards PT goals: Progressing toward goals    Frequency    Min 1X/week      PT Plan      Co-evaluation              AM-PAC PT 6 Clicks Mobility   Outcome Measure  Help needed turning from your back to your side while in a flat bed without using bedrails?: A Little Help needed moving from lying on your back to sitting on the side of a flat bed without using bedrails?: A Little Help needed moving to and from a bed to a chair (including a wheelchair)?: A Little Help needed standing up from a chair using your arms (e.g., wheelchair or bedside chair)?: A Little Help needed to walk in hospital room?: A Little Help needed climbing 3-5 steps with a railing? : A Lot 6 Click Score: 17    End of Session   Activity Tolerance: Patient limited by fatigue Patient left: in bed;with call bell/phone within reach;with bed alarm set Nurse Communication: Mobility status PT Visit Diagnosis: Other abnormalities of gait and mobility (R26.89);Pain     Time: 9082-9066 PT Time Calculation (min) (ACUTE ONLY): 16 min  Charges:    $Therapeutic Activity: 8-22 mins PT General Charges $$ ACUTE PT VISIT: 1 Visit                     Christine Morse, PT DPT Acute Rehabilitation Services Secure Chat Preferred  Office [phone]    Christine Morse 12/31/2023, 11:04 AM

## 2024-01-01 ENCOUNTER — Inpatient Hospital Stay (HOSPITAL_COMMUNITY): Payer: Medicare Other

## 2024-01-01 LAB — CBC
HCT: 32.5 % — ABNORMAL LOW (ref 36.0–46.0)
Hemoglobin: 10.2 g/dL — ABNORMAL LOW (ref 12.0–15.0)
MCH: 28.3 pg (ref 26.0–34.0)
MCHC: 31.4 g/dL (ref 30.0–36.0)
MCV: 90.3 fL (ref 80.0–100.0)
Platelets: 554 10*3/uL — ABNORMAL HIGH (ref 150–400)
RBC: 3.6 MIL/uL — ABNORMAL LOW (ref 3.87–5.11)
RDW: 16.4 % — ABNORMAL HIGH (ref 11.5–15.5)
WBC: 8.6 10*3/uL (ref 4.0–10.5)
nRBC: 0 % (ref 0.0–0.2)

## 2024-01-01 LAB — BASIC METABOLIC PANEL
Anion gap: 8 (ref 5–15)
BUN: 16 mg/dL (ref 8–23)
CO2: 28 mmol/L (ref 22–32)
Calcium: 8.5 mg/dL — ABNORMAL LOW (ref 8.9–10.3)
Chloride: 99 mmol/L (ref 98–111)
Creatinine, Ser: 0.4 mg/dL — ABNORMAL LOW (ref 0.44–1.00)
GFR, Estimated: >60 mL/min (ref >60)
Glucose, Bld: 138 mg/dL — ABNORMAL HIGH (ref 70–99)
Potassium: 4.2 mmol/L (ref 3.5–5.1)
Sodium: 135 mmol/L (ref 135–145)

## 2024-01-01 MED ORDER — TRACE MINERALS CU-MN-SE-ZN 300-55-60-3000 MCG/ML IV SOLN
INTRAVENOUS | Status: AC
  Filled 2024-01-01: qty 990

## 2024-01-01 NOTE — Progress Notes (Signed)
 39 Days Post-Op   Subjective/Chief Complaint: NG in place, no complaints voiced  Objective: Vital signs in last 24 hours: Temp:  [97.5 F (36.4 C)-97.9 F (36.6 C)] 97.7 F (36.5 C) (01/11 0830) Pulse Rate:  [83-99] 98 (01/11 0830) Resp:  [17-18] 18 (01/11 0830) BP: (114-158)/(56-82) 136/75 (01/11 0830) SpO2:  [96 %-100 %] 96 % (01/11 0830) Weight:  [47.9 kg] 47.9 kg (01/11 0500) Last BM Date : 12/31/23  Intake/Output from previous day: 01/10 0701 - 01/11 0700 In: 2463 [I.V.:1862.9; IV Piggyback:600.1] Out: 910 [Urine:360; Emesis/NG output:350; Drains:200] Intake/Output this shift: No intake/output data recorded.  Physical Exam:  General appearance: OOB in chair.  Continues to look very weak, but is conversant and makes sense. Resp: breathing comfortably GI: soft, non distended. No rigidity or guarding. Clean. Midline wound with staples out.             D1 - LLQ - 200 cc -purulent appearing             D2 RLQ - removed 1/10 Extremities: tr LE edema  Lab Results:  Recent Labs    12/30/23 0430 01/01/24 0345  WBC 9.1 8.6  HGB 8.9* 10.2*  HCT 28.4* 32.5*  PLT 474* 554*    BMET Recent Labs    12/31/23 0502 01/01/24 0345  NA 133* 135  K 4.1 4.2  CL 100 99  CO2 26 28  GLUCOSE 131* 138*  BUN 15 16  CREATININE <0.30* 0.40*  CALCIUM  8.3* 8.5*   PT/INR No results for input(s): LABPROT, INR in the last 72 hours.   ABG No results for input(s): PHART, HCO3 in the last 72 hours.  Invalid input(s): PCO2, PO2   Studies/Results:  Anti-infectives: Anti-infectives (From admission, onward)    Start     Dose/Rate Route Frequency Ordered Stop   12/29/23 1630  cefTRIAXone  (ROCEPHIN ) 2 g in sodium chloride  0.9 % 100 mL IVPB       Note to Pharmacy: Pharmacy may adjust dosing strength / duration / interval for maximal efficacy   2 g 200 mL/hr over 30 Minutes Intravenous Every 24 hours 12/29/23 1536     12/29/23 1630  metroNIDAZOLE  (FLAGYL ) IVPB 500 mg         500 mg 100 mL/hr over 60 Minutes Intravenous Every 12 hours 12/29/23 1536     12/24/23 1000  piperacillin -tazobactam (ZOSYN ) IVPB 3.375 g  Status:  Discontinued        3.375 g 12.5 mL/hr over 240 Minutes Intravenous Every 8 hours 12/24/23 0913 12/29/23 1536   12/13/23 1700  piperacillin -tazobactam (ZOSYN ) IVPB 3.375 g        3.375 g 12.5 mL/hr over 240 Minutes Intravenous Every 8 hours 12/13/23 1412 12/13/23 2139   11/30/23 1230  fluconazole  (DIFLUCAN ) IVPB 400 mg  Status:  Discontinued        400 mg 100 mL/hr over 120 Minutes Intravenous Every 24 hours 11/30/23 1135 12/28/23 1321   11/26/23 0945  piperacillin -tazobactam (ZOSYN ) IVPB 3.375 g  Status:  Discontinued        3.375 g 12.5 mL/hr over 240 Minutes Intravenous Every 8 hours 11/26/23 0853 12/13/23 1412   11/23/23 2100  ceFAZolin  (ANCEF ) IVPB 2g/100 mL premix        2 g 200 mL/hr over 30 Minutes Intravenous Every 8 hours 11/23/23 1654 11/23/23 2214   11/23/23 0700  ceFAZolin  (ANCEF ) IVPB 2g/100 mL premix        2 g 200 mL/hr over 30 Minutes Intravenous On  call to O.R. 11/23/23 0654 11/23/23 1249       Assessment/Plan: Christine Morse is an 88 year old female status post Whipple Procedure on 11/23/2023, (POD 39).  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 Rare candida seen on final culture. Completed Zosyn . Fluc stopped yesterday as repeat cx negative for candida. Repeat CT 1/2 with abdominal wall collection now s/p IR aspiration 1/3 and bedside I&D 1/4.  Zosyn  restarted 1/3 - stopped 1/8 and switched to ceftriaxone /flagyl  based on cx of citrobacter and b frag.   Pancreatic leak - some leakage via abd wound. R drain removed 12/31/23.  Continue left sided drain and dressing changes.   Delayed gastric emptying. Qtc ok.  Continue reglan . TPN. Failed clamping trial again 12/27/22.  NGT out accidentally 1/9.  Replace when nausea recurs.  Have discussed g tube with IR>  HOCM - Continue beta blockade and treat hypertension. IV metoprolol  q4h per Cardiology.   Anemia of chronic disease, chronic blood loss anemia, and acute blood loss anemia- S/p 1U PRBC 12/27.  Hyperglycemia - improving, monitor   Hypophosphatemia - resolved Hypokalemia - resolved. Pharmacy monitoring.  Severe protein calorie malnutrition - TNA. RUE DVT - treatment dose lovenox .  New picc placed opposite side.   OOB, IS, PT consult.  Thrush - nystatin  suspension  FEN - TPN. Cont PPI BID & Carafate  via tube. SABRA   VTE - lovenox  treatment dose - cont. Right sided picc out.   ID - Fluconazole - stopped 1/7, resumed Zosyn  1/3 >> afebrile with normal WBCs.  Switched to ceftriaxone  and flagyl  1/8.  Dispo: Per Dr. Aron - Will get palliative care consult to help with symptom management and goals of care.  Primary issue at this point is delayed gastric emptying as well as the pancreatic leak.  Hopefully we can get a percutaneous G-tube placed in order to minimize her need for nasogastric tube.  This would allow her to clamp it or open to drain which ever she needs.  She would not need to be tethered to wall suction.  Hopefully this can help her get out of the hospital to either skilled nursing facility, LTAC, or home.  At this point, she will continue to need the TPN for now due to the inability to take p.o.  IR may attempt to do a gastrojejunostomy tube which might allow us  to start tube feeds, but sometimes this is fraught with difficulty as the J-tube port can migrate proximally.  We will address this when we get to this point.   LOS: 39 days    01/01/2024, 10:11 AM   Christine Pizza, MD [facility]

## 2024-01-01 NOTE — Progress Notes (Signed)
 4098 pt alerted RN that she threw up. RN called [facility] after hours line [phone]) no one answered. RN then called Violeta Gelinas to discuss the pt. He directed RN to give 10 mg Compazine IV.

## 2024-01-02 MED ORDER — TRACE MINERALS CU-MN-SE-ZN 300-55-60-3000 MCG/ML IV SOLN
INTRAVENOUS | Status: AC
  Filled 2024-01-02: qty 990

## 2024-01-02 NOTE — Consult Note (Signed)
 Palliative Care Consult Note                                  Date: 01/02/2024   Patient Name: Christine Morse  DOB: 1936-03-15  MRN: [id number]  Age / Sex: 88 y.o., female  PCP: Waylan Almarie SAUNDERS, MD Referring Physician: Aron Shoulders, MD  Reason for Consultation: Establishing goals of care; Duodenal cancer, failure to thrive post op with delayed gastric emptying  HPI/Patient Profile: 88 y.o. female  with past medical history of chronic blood loss anemia, aortic stenosis, HTN, HLD, GERD, diverticular disease, and recent diagnosis of duodenal mass suspected to be invasive cancer with plans for a Whipple procedure admitted on 11/23/2023 with shortness of breath with little exertion and lower extremity edema.   Patient had Whipple Procedure 11/23/2023 (POD 40). Final pathology - adenocarcinoma duodenum, positive margin, positive LN - pT3pN2cM0. Since then she has had delayed gastric emptying and a pancreatic leak. She currently has a NG tube in place. She is on TPN.   Past Medical History:  Diagnosis Date   Anemia    Arthritis    Cyst of ovary, right 2013   /US  (10/04/2013)   Diverticulitis    Diverticulosis    Duodenal adenoma    GERD (gastroesophageal reflux disease)    History of blood transfusion    just today; my HgB is 5.1 (10/04/2013)   History of gastric ulcer    History of hiatal hernia    HOCM (hypertrophic obstructive cardiomyopathy) (HCC) 10/24/2023   Hyperlipidemia    Hypertension    Mitral regurgitation    moderate MR 10/24/23   Nephrolithiasis    passed them on my own; went away after I quit drinking tea (10/04/2013)   Osteoporosis    Vitamin D deficiency     Subjective:   I have reviewed medical records including EPIC notes, labs and imaging, received an update from nursing, assessed the patient and then spoke with the patient to begin discussions around diagnosis prognosis, GOC, EOL wishes, disposition and  options.  I introduced Palliative Medicine as specialized medical care for people living with serious illness. It focuses on providing relief from symptoms and stress of a serious illness. The goal is to improve quality of life for both the patient and the family.  Patient shares that she would like her two daughters Natassja Ollis and Rojelio Novak and nephew present for goals of care discussions.  Today's Discussion: Patient is sitting up in the recliner. She reports she is doing okay, but she is weak. We discuss her long hospital stay and she shares that her surgical recovery has been a lot more than she expected. Nursing staff share she has been discussing how hard her recovery has been and wondering if she wants to continue with the current plan. Patient shares that nausea comes and goes, but that her current medication regimen is working. Patient asks me if her cancer has gotten any better I shared that I had not seen a recent note from oncology regarding this or options moving forward. I will ask the team if they have more information as this will be important before goals of care conversations.  Discussed the importance of continued conversation with family and the medical providers regarding overall plan of care and treatment options, ensuring decisions are within the context of the patient's values and GOCs. Patient is open to discussing goals of care moving  forward and would like her daughters and nephew present. I share that we will be discussing concepts such as code status and scope of care. Patient shares she has a living will and that her daughters can bring it for us  to review.  Patient was a home maker as her husband was a long distance driver who would often be out of town a week at a time. They were married for over 60 years until he passed away a few years ago. When her husband passed away her granddaughter Josette moved in with her. Josette would help with household tasks if the patient  asked. The patient was doing all her ADLs independently prior to admission. She was not using any assistive devices.   1150: Left voicemail for daughter Sakiya Stepka with contact information. Called daughter Rojelio Novak-- voicemail full.  Review of Systems  Gastrointestinal:  Positive for nausea.    Objective:   Primary Diagnoses: Present on Admission:  Duodenal adenoma   Physical Exam Vitals reviewed.  Constitutional:      Appearance: She is ill-appearing.     Comments: NG tube  Cardiovascular:     Rate and Rhythm: Normal rate.  Pulmonary:     Effort: Pulmonary effort is normal.  Skin:    General: Skin is warm and dry.  Neurological:     Mental Status: She is alert and oriented to person, place, and time.  Psychiatric:        Mood and Affect: Mood normal.        Behavior: Behavior normal.     Vital Signs:  BP (!) 147/75 (BP Location: Right Arm)   Pulse 90   Temp 98.4 F (36.9 C) (Oral)   Resp 20   Ht 4' 10 (1.473 m)   Wt 47.9 kg   SpO2 97%   BMI 22.09 kg/m     Advanced Care Planning:   Existing Vynca/ACP Documentation: None  Primary Decision Maker: PATIENT  Code Status/Advance Care Planning: Full code   Assessment & Plan:   SUMMARY OF RECOMMENDATIONS   Full code Full scope Get update from oncology Goals of care meeting: Left vmail for daughter Heidi Continued PMT support    Discussed with: bedside RN  Time Total: 65 minutes  Thank you for allowing us  to participate in the care of OCTAVIE WESTERHOLD PMT will continue to support holistically.   Signed by: Stephane Palin, NP Palliative Medicine Team  Team Phone # [phone] (Nights/Weekends)  01/02/2024, 10:58 AM

## 2024-01-02 NOTE — Progress Notes (Signed)
 PHARMACY - TOTAL PARENTERAL NUTRITION CONSULT NOTE  Indication:  delayed gastric emptying  Patient Measurements: Height: 4' 10 (147.3 cm) Weight: 47.9 kg (105 lb 11.2 oz) IBW/kg (Calculated) : 40.9 TPN AdjBW (KG): 44.5 Body mass index is 22.09 kg/m. 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  Patient was started on a CLD on 12/6-12/9 and then advanced to a soft diet since 12/10.  Patient has had minimal PO intake since being post-op and continues to experience nausea. Delayed gastric emptying likely related to possible pancreatic leak.  Pharmacy consulted to dose TPN.  Glucose / Insulin : A1c 3.7 (11/15/23) - glucose <180, no SSI Electrolytes: all WNL Renal: SCr < 1, BUN WNL Hepatic: AST/ALT/tbili WNL, alk phos down to 150, albumin  2.1 Intake / Output; MIVF: UOP 0.1 ml/kg/hr, NGT , drain , LBM 1/10 (s/p Reglan  x3 doses 12/11-12/12, restarted 12/15), net +14.8L (up, does not appear volume overloaded 1/12) GI Imaging: 12/7 XR: unremarkable bowel gas pattern. 12/11 CT: possible post-op seroma or hematoma vs abscess, distal colonic diverticulosis 12/15 XR: expected post-op changes, NGT in correct place 12/26 CT: perc drain cath adj left lobe liver, no discernable residual fluid collection. Post whipple w/ no evidence pancreatic ductal dilatation or peripancreatic fluid collection, small amount of free fluid in pelvis 1/2 CT - minimal fluid near drain in abdomen  GI Surgeries / Procedures:  12/3 diagnostic laparoscopy, Whipple procedure, pancreatic duct stent 12/13 subhepatic fluid collection aspiration and drainage 1/3 IR US  drain placement of abdominal abscess   Central access: PICC replaced 12/27/23 TPN start date: 12/02/23  Nutritional Goals: RD Estimated Needs Total Energy Estimated Needs: 1700-2000 kcal Total Protein  Estimated Needs: 85-100 gm Total Fluid Estimated Needs: 1 ml/kcal  Current Nutrition:  TPN  Plan:  Continue TPN to goal rate 75 mL/hr to provide 1732 kCal and 99g AA, meeting 100% of nutritional needs.  Electrolytes in TPN: Na 51mEq/L, K 10mEq/L, Ca 35mEq/L, Mg 78mEq/L, Phos 15mmol/L, change to Cl:Ac 1:2 on 1/11 Monitor TPN labs on Mon/Thurs Consider cycling TPN  F/u GoC, decision for G-tube   Christine Morse, PharmD, BCPS, BCCCP 01/02/2024, 9:07 AM

## 2024-01-02 NOTE — Plan of Care (Signed)
 IR was consulted for G/GJ tube placement, Dr. Adele discussed the patient with Dr. Aron last week.   This will be a challenging procedure per Dr. Adele.  Will discuss with Dr. Hughes tomorrow to see if we can attempt placement on Monday/Tuesday.  Patient on TPN, Lovenox  for Sunday PM and Monday AM MAR held.  Will update INR tomorrow AM.   Formal consult to follow.   Ezariah Nace H Jonel Weldon PA-C 01/02/2024 11:35 AM

## 2024-01-02 NOTE — Progress Notes (Signed)
 40 Days Post-Op   Subjective/Chief Complaint: NG in place, no complaints voiced  Objective: Vital signs in last 24 hours: Temp:  [97.6 F (36.4 C)-98.7 F (37.1 C)] 98.4 F (36.9 C) (01/12 0740) Pulse Rate:  [78-96] 90 (01/12 0740) Resp:  [16-20] 20 (01/12 0740) BP: (113-151)/(52-82) 147/75 (01/12 0740) SpO2:  [95 %-100 %] 97 % (01/12 0740) Last BM Date : 12/31/23  Intake/Output from previous day: 01/11 0701 - 01/12 0700 In: 3007 [P.O.:50; I.V.:2512.7; NG/GT:120; IV Piggyback:319.3] Out: 790 [Urine:120; Emesis/NG output:550; Drains:120] Intake/Output this shift: No intake/output data recorded.  Physical Exam:  General appearance: OOB in chair.  Continues to look very weak, but is conversant and makes sense. Resp: breathing comfortably GI: soft, non distended. No rigidity or guarding. Clean. Midline wound with staples out.             D1 - LLQ - 120 cc - turbid serous appearing today             D2 RLQ - removed 1/10 Extremities: tr LE edema  Lab Results:  Recent Labs    01/01/24 0345  WBC 8.6  HGB 10.2*  HCT 32.5*  PLT 554*    BMET Recent Labs    12/31/23 0502 01/01/24 0345  NA 133* 135  K 4.1 4.2  CL 100 99  CO2 26 28  GLUCOSE 131* 138*  BUN 15 16  CREATININE <0.30* 0.40*  CALCIUM  8.3* 8.5*   PT/INR No results for input(s): LABPROT, INR in the last 72 hours.   ABG No results for input(s): PHART, HCO3 in the last 72 hours.  Invalid input(s): PCO2, PO2   Studies/Results:  Anti-infectives: Anti-infectives (From admission, onward)    Start     Dose/Rate Route Frequency Ordered Stop   12/29/23 1630  cefTRIAXone  (ROCEPHIN ) 2 g in sodium chloride  0.9 % 100 mL IVPB       Note to Pharmacy: Pharmacy may adjust dosing strength / duration / interval for maximal efficacy   2 g 200 mL/hr over 30 Minutes Intravenous Every 24 hours 12/29/23 1536     12/29/23 1630  metroNIDAZOLE  (FLAGYL ) IVPB 500 mg        500 mg 100 mL/hr over 60 Minutes  Intravenous Every 12 hours 12/29/23 1536     12/24/23 1000  piperacillin -tazobactam (ZOSYN ) IVPB 3.375 g  Status:  Discontinued        3.375 g 12.5 mL/hr over 240 Minutes Intravenous Every 8 hours 12/24/23 0913 12/29/23 1536   12/13/23 1700  piperacillin -tazobactam (ZOSYN ) IVPB 3.375 g        3.375 g 12.5 mL/hr over 240 Minutes Intravenous Every 8 hours 12/13/23 1412 12/13/23 2139   11/30/23 1230  fluconazole  (DIFLUCAN ) IVPB 400 mg  Status:  Discontinued        400 mg 100 mL/hr over 120 Minutes Intravenous Every 24 hours 11/30/23 1135 12/28/23 1321   11/26/23 0945  piperacillin -tazobactam (ZOSYN ) IVPB 3.375 g  Status:  Discontinued        3.375 g 12.5 mL/hr over 240 Minutes Intravenous Every 8 hours 11/26/23 0853 12/13/23 1412   11/23/23 2100  ceFAZolin  (ANCEF ) IVPB 2g/100 mL premix        2 g 200 mL/hr over 30 Minutes Intravenous Every 8 hours 11/23/23 1654 11/23/23 2214   11/23/23 0700  ceFAZolin  (ANCEF ) IVPB 2g/100 mL premix        2 g 200 mL/hr over 30 Minutes Intravenous On call to O.R. 11/23/23 0654 11/23/23 1249  Assessment/Plan: Ms. Malesky is an 88 year old female status post Whipple Procedure on 11/23/2023, (POD 40).  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 Rare candida seen on final culture. Completed Zosyn . Fluc stopped yesterday as repeat cx negative for candida. Repeat CT 1/2 with abdominal wall collection now s/p IR aspiration 1/3 and bedside I&D 1/4.  Zosyn  restarted 1/3 - stopped 1/8 and switched to ceftriaxone /flagyl  based on cx of citrobacter and b frag.   Pancreatic leak - some leakage via abd wound. R drain removed 12/31/23.  Continue left sided drain and dressing changes.   Delayed gastric emptying. Qtc ok.  Continue reglan . TPN. Failed clamping trial again 12/27/22. NGT out accidentally 1/9.  Replace when  nausea recurs.  Have discussed g tube with IR>  HOCM - Continue beta blockade and treat hypertension. IV metoprolol  q4h per Cardiology.   Anemia of chronic disease, chronic blood loss anemia, and acute blood loss anemia- S/p 1U PRBC 12/27.  Hyperglycemia - improving, monitor   Hypophosphatemia - resolved Hypokalemia - resolved. Pharmacy monitoring.  Severe protein calorie malnutrition - TNA. RUE DVT - treatment dose lovenox .  New picc placed opposite side.   OOB, IS, PT consult.  Thrush - nystatin  suspension  FEN - TPN. Cont PPI BID & Carafate  via tube. SABRA   VTE - lovenox  treatment dose - cont. Right sided picc out.   ID - Fluconazole - stopped 1/7, resumed Zosyn  1/3 >> afebrile with normal WBCs.  Switched to ceftriaxone  and flagyl  1/8.  Dispo: Per Dr. Aron - Will get palliative care consult to help with symptom management and goals of care.  Primary issue at this point is delayed gastric emptying as well as the pancreatic leak.  Hopefully we can get a percutaneous G-tube placed in order to minimize her need for nasogastric tube.  This would allow her to clamp it or open to drain which ever she needs.  She would not need to be tethered to wall suction.  Hopefully this can help her get out of the hospital to either skilled nursing facility, LTAC, or home.  At this point, she will continue to need the TPN for now due to the inability to take p.o.  IR may attempt to do a gastrojejunostomy tube which might allow us  to start tube feeds, but sometimes this is fraught with difficulty as the J-tube port can migrate proximally.  We will address this when we get to this point.   LOS: 40 days    01/02/2024, 9:26 AM  Lonni Pizza, MD [facility], [facility]

## 2024-01-03 ENCOUNTER — Inpatient Hospital Stay (HOSPITAL_COMMUNITY): Payer: Medicare Other

## 2024-01-03 DIAGNOSIS — Z711 Person with feared health complaint in whom no diagnosis is made: Secondary | ICD-10-CM

## 2024-01-03 DIAGNOSIS — Z515 Encounter for palliative care: Secondary | ICD-10-CM

## 2024-01-03 DIAGNOSIS — R5383 Other fatigue: Secondary | ICD-10-CM

## 2024-01-03 DIAGNOSIS — Z789 Other specified health status: Secondary | ICD-10-CM

## 2024-01-03 DIAGNOSIS — D132 Benign neoplasm of duodenum: Secondary | ICD-10-CM | POA: Diagnosis not present

## 2024-01-03 DIAGNOSIS — R5381 Other malaise: Secondary | ICD-10-CM

## 2024-01-03 LAB — COMPREHENSIVE METABOLIC PANEL
ALT: 30 U/L (ref 0–44)
AST: 22 U/L (ref 15–41)
Albumin: 2.3 g/dL — ABNORMAL LOW (ref 3.5–5.0)
Alkaline Phosphatase: 99 U/L (ref 38–126)
Anion gap: 4 — ABNORMAL LOW (ref 5–15)
BUN: 16 mg/dL (ref 8–23)
CO2: 29 mmol/L (ref 22–32)
Calcium: 8.2 mg/dL — ABNORMAL LOW (ref 8.9–10.3)
Chloride: 102 mmol/L (ref 98–111)
Creatinine, Ser: 0.33 mg/dL — ABNORMAL LOW (ref 0.44–1.00)
GFR, Estimated: >60 mL/min (ref >60)
Glucose, Bld: 145 mg/dL — ABNORMAL HIGH (ref 70–99)
Potassium: 3.9 mmol/L (ref 3.5–5.1)
Sodium: 135 mmol/L (ref 135–145)
Total Bilirubin: 0.4 mg/dL (ref 0.0–1.2)
Total Protein: 5.6 g/dL — ABNORMAL LOW (ref 6.5–8.1)

## 2024-01-03 LAB — MAGNESIUM: Magnesium: 1.9 mg/dL (ref 1.7–2.4)

## 2024-01-03 LAB — PROTIME-INR
INR: 1.1 (ref 0.8–1.2)
Prothrombin Time: 14 s (ref 11.4–15.2)

## 2024-01-03 LAB — TRIGLYCERIDES: Triglycerides: 90 mg/dL (ref <150)

## 2024-01-03 LAB — GLUCOSE, CAPILLARY: Glucose-Capillary: 138 mg/dL — ABNORMAL HIGH (ref 70–99)

## 2024-01-03 LAB — PHOSPHORUS: Phosphorus: 3 mg/dL (ref 2.5–4.6)

## 2024-01-03 MED ORDER — TRAVASOL 10 % IV SOLN
INTRAVENOUS | Status: AC
  Filled 2024-01-03: qty 990

## 2024-01-03 NOTE — Progress Notes (Signed)
 41 Days Post-Op   Subjective/Chief Complaint: NGT came out ago.  Objective: Vital signs in last 24 hours: Temp:  [97.4 F (36.3 C)-98.4 F (36.9 C)] 97.9 F (36.6 C) (01/13 0800) Pulse Rate:  [84-100] 100 (01/13 0800) Resp:  [16-20] 20 (01/13 0800) BP: (133-179)/(61-83) 152/83 (01/13 0800) SpO2:  [95 %-100 %] 100 % (01/13 0800) Weight:  [49.3 kg] 49.3 kg (01/13 0500) Last BM Date : 01/01/24  Intake/Output from previous day: 01/12 0701 - 01/13 0700 In: 2393.9 [I.V.:1943.9; NG/GT:50; IV Piggyback:400] Out: 720 [Emesis/NG output:650; Drains:70] Intake/Output this shift: No intake/output data recorded.  Physical Exam:  General appearance: OOB in chair.  Continues to look very weak, but is conversant and makes sense. Resp: breathing comfortably GI: soft, non distended. No rigidity or guarding. Midline wound with panc drainage             D1 - LLQ - 70 cc -purulent appearing             D2 RLQ - removed 1/10 Extremities: tr LE edema  Lab Results:  Recent Labs    01/01/24 0345  WBC 8.6  HGB 10.2*  HCT 32.5*  PLT 554*    BMET Recent Labs    01/01/24 0345 01/03/24 0651  NA 135 135  K 4.2 3.9  CL 99 102  CO2 28 29  GLUCOSE 138* 145*  BUN 16 16  CREATININE 0.40* 0.33*  CALCIUM  8.5* 8.2*   PT/INR Recent Labs    01/03/24 0651  LABPROT 14.0  INR 1.1     ABG No results for input(s): PHART, HCO3 in the last 72 hours.  Invalid input(s): PCO2, PO2   Studies/Results:  Anti-infectives: Anti-infectives (From admission, onward)    Start     Dose/Rate Route Frequency Ordered Stop   12/29/23 1630  cefTRIAXone  (ROCEPHIN ) 2 g in sodium chloride  0.9 % 100 mL IVPB       Note to Pharmacy: Pharmacy may adjust dosing strength / duration / interval for maximal efficacy   2 g 200 mL/hr over 30 Minutes Intravenous Every 24 hours 12/29/23 1536     12/29/23 1630  metroNIDAZOLE  (FLAGYL ) IVPB 500 mg        500 mg 100 mL/hr over 60 Minutes Intravenous Every 12  hours 12/29/23 1536     12/24/23 1000  piperacillin -tazobactam (ZOSYN ) IVPB 3.375 g  Status:  Discontinued        3.375 g 12.5 mL/hr over 240 Minutes Intravenous Every 8 hours 12/24/23 0913 12/29/23 1536   12/13/23 1700  piperacillin -tazobactam (ZOSYN ) IVPB 3.375 g        3.375 g 12.5 mL/hr over 240 Minutes Intravenous Every 8 hours 12/13/23 1412 12/13/23 2139   11/30/23 1230  fluconazole  (DIFLUCAN ) IVPB 400 mg  Status:  Discontinued        400 mg 100 mL/hr over 120 Minutes Intravenous Every 24 hours 11/30/23 1135 12/28/23 1321   11/26/23 0945  piperacillin -tazobactam (ZOSYN ) IVPB 3.375 g  Status:  Discontinued        3.375 g 12.5 mL/hr over 240 Minutes Intravenous Every 8 hours 11/26/23 0853 12/13/23 1412   11/23/23 2100  ceFAZolin  (ANCEF ) IVPB 2g/100 mL premix        2 g 200 mL/hr over 30 Minutes Intravenous Every 8 hours 11/23/23 1654 11/23/23 2214   11/23/23 0700  ceFAZolin  (ANCEF ) IVPB 2g/100 mL premix        2 g 200 mL/hr over 30 Minutes Intravenous On call to O.R. 11/23/23 9345  11/23/23 1249       Assessment/Plan: Christine Morse is an 88 year old female status post Whipple Procedure on 11/23/2023, (POD 41).  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 Rare candida seen on final culture. Completed Zosyn . Fluc stopped yesterday as repeat cx negative for candida. Repeat CT 1/2 with abdominal wall collection now s/p IR aspiration 1/3 and bedside I&D 1/4.  Zosyn  restarted 1/3 - stopped 1/8 and switched to ceftriaxone /flagyl  based on cx of citrobacter and b frag.   Pancreatic leak - some leakage via abd wound. R drain removed 12/31/23.  Continue left sided drain and dressing changes.   Delayed gastric emptying. Qtc ok.  Continue reglan . TPN. Failed clamping trial again 12/27/22. NGT out accidentally 1/9.  Replace when nausea recurs.  Have  discussed g tube with IR>  HOCM - Continue beta blockade and treat hypertension. IV metoprolol  q4h per Cardiology.   Anemia of chronic disease, chronic blood loss anemia, and acute blood loss anemia- S/p 1U PRBC 12/27.  Hyperglycemia - improving, monitor   Hypophosphatemia - resolved Hypokalemia - resolved. Pharmacy monitoring.  Severe protein calorie malnutrition - TNA. RUE DVT - treatment dose lovenox .  New picc placed opposite side.   OOB, IS, PT consult.  Thrush - nystatin  suspension  FEN - TPN. Cont PPI BID & Carafate  via tube. SABRA   VTE - lovenox  treatment dose - cont. Right sided picc out.   ID - Fluconazole - stopped 1/7, resumed Zosyn  1/3 >> afebrile with normal WBCs.  Switched to ceftriaxone  and flagyl  1/8.  Dispo: hopefully perc g tube can get placed today or tomorrow.  Hopefully this can help with palliation of symptoms and facilitate placement   LOS: 41 days    01/03/2024, 9:12 AM   Jina LITTIE Nephew, MD, FACS, FSSO Surgical Oncology, General Surgery, Trauma and Critical [facility], [place] [phone] for weekday/non holidays Check [email or web address] for coverage night/weekend/holidays

## 2024-01-03 NOTE — Progress Notes (Addendum)
 PHARMACY - TOTAL PARENTERAL NUTRITION CONSULT NOTE  Indication:  delayed gastric emptying  Patient Measurements: Height: 4' 10 (147.3 cm) Weight: 49.3 kg (108 lb 11.2 oz) IBW/kg (Calculated) : 40.9 TPN AdjBW (KG): 44.5 Body mass index is 22.72 kg/m. 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  Patient was started on a CLD on 12/6-12/9 and then advanced to a soft diet since 12/10.  Patient has had minimal PO intake since being post-op and continues to experience nausea. Delayed gastric emptying likely related to possible pancreatic leak.  Pharmacy consulted to dose TPN.  Glucose / Insulin : A1c 3.7 (11/15/23) - glucose <180, no SSI Electrolytes: all WNL Renal: SCr < 1, BUN WNL Hepatic: AST/ALT/tbili WNL, alk phos wnl, albumin  2.3, TG 90 Intake / Output; MIVF: UOP 5+ unmeasured, NGT , drain 70mL, LBM 1/11 (restarted 12/15), net +9.7L (up, does not appear volume overloaded 1/12) GI Imaging: 12/7 XR: unremarkable bowel gas pattern. 12/11 CT: possible post-op seroma or hematoma vs abscess, distal colonic diverticulosis 12/15 XR: expected post-op changes, NGT in correct place 12/26 CT: perc drain cath adj left lobe liver, no discernable residual fluid collection. Post whipple w/ no evidence pancreatic ductal dilatation or peripancreatic fluid collection, small amount of free fluid in pelvis 1/2 CT - minimal fluid near drain in abdomen  GI Surgeries / Procedures:  12/3 diagnostic laparoscopy, Whipple procedure, pancreatic duct stent 12/13 subhepatic fluid collection aspiration and drainage 1/3 IR US  drain placement of abdominal abscess   Central access: PICC replaced 12/27/23 TPN start date: 12/02/23  Nutritional Goals: RD Estimated Needs Total Energy Estimated Needs: 1700-2000 kcal Total Protein Estimated Needs: 85-100 gm Total Fluid  Estimated Needs: 1 ml/kcal  Current Nutrition:  TPN  Plan:  Continue TPN to goal rate 75 mL/hr to provide 1732 kCal and 99g AA, meeting 100% of nutritional needs.  Electrolytes in TPN: Na 84mEq/L, K 98mEq/L, Ca 30mEq/L, Mg 35mEq/L, Phos 15mmol/L, change to Cl:Ac 1:2 on 1/11 Monitor TPN labs on Mon/Thurs Bmet 1/14 Consider cycling TPN  Perc G tube either 1/13 or 1/14  Arius Harnois BS, PharmD, BCPS Clinical Pharmacist 01/03/2024 7:10 AM  Contact: [phone] after 3 PM  Be curious, not judgmental... -Davina Sprinkles

## 2024-01-03 NOTE — Plan of Care (Signed)

## 2024-01-03 NOTE — Progress Notes (Signed)
 Pt's RN requests SRN to come look at patient. She is concerned about facial asymmetry that she feels is more pronounced than when pt was last seen at 0900. BP, CBG WNL.  Pt examined, Left NL fold is flatter than right, but face becomes equal upon grimace. She is sleepy, and disoriented to age, which does not appear new. No focal abnormalities noted. No LVO signs noted. NIHSS 2.  Pt does not meet criteria for Code Stroke activation as no focal abnormality. Discussed with RN Jackolyn.  Richardson Said RN Stroke Response [phone]

## 2024-01-03 NOTE — Progress Notes (Signed)
 Physical Therapy Treatment Patient Details Name: Christine Morse MRN: [id number] DOB: 11-Jul-1936 Today's Date: 01/03/2024   History of Present Illness Patient is an 88 y/o female admitted 11/23/23 with diagnosis of duodenal adenoma and underwent laparoscopic Whipple procedure. post-op subcapsular fluid collection around the anterior aspect of the left liver lobe. Drain placement in IR 12/03/23. NGT inserted 12/15, dislodged then replaced.  Positive for R subclavian and axillary DVT on 12/07/23. 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    PT Comments  Patient tolerating increased distance with ambulation this session compared to last.  Slow pace and shuffling her feet.  She chose to sit up in the chair again after ambulation so tolerating upright better.  Feel she will need continued 24 hour assist at d/c.  Goals updated this session.  Noted hopeful for PEG placement soon.  Will need inpatient rehab (<3 hours/day) at d/c.    If plan is discharge home, recommend the following: A little help with walking and/or transfers;A little help with bathing/dressing/bathroom;Assistance with cooking/housework;Assist for transportation   Can travel by private vehicle     Yes  Equipment Recommendations  Rolling walker (2 wheels);BSC/3in1    Recommendations for Other Services       Precautions / Restrictions Precautions Precautions: Fall Precaution Comments: NG tube, L LQ Blake drain     Mobility  Bed Mobility               General bed mobility comments: in recliner    Transfers Overall transfer level: Needs assistance Equipment used: Rolling walker (2 wheels) Transfers: Sit to/from Stand Sit to Stand: Min assist           General transfer comment: light rise and steady assist    Ambulation/Gait   Gait Distance (Feet): 90 Feet Assistive device: Rolling walker (2 wheels) Gait Pattern/deviations: Decreased stride length, Trunk flexed, Step-to  pattern, Shuffle, Narrow base of support       General Gait Details: small shuffling steps (shorter with increased distance) assist for walker management on turns, flexed posture and forward head   Stairs             Wheelchair Mobility     Tilt Bed    Modified Rankin (Stroke Patients Only)       Balance Overall balance assessment: Needs assistance Sitting-balance support: Feet supported Sitting balance-[name]: Good     Standing balance support: Bilateral upper extremity supported Standing balance-[name]: Poor Standing balance comment: UE support for balance                            Cognition Arousal: Alert Behavior During Therapy: Flat affect Overall Cognitive Status: No family/caregiver present to determine baseline cognitive functioning Area of Impairment: Memory, Safety/judgement, Problem solving                     Memory: Decreased short-term memory   Safety/Judgement: Decreased awareness of deficits   Problem Solving: Slow processing General Comments: limited verbalizations, stated I don't know when asked about getting up        Exercises General Exercises - Lower Extremity Long Arc Quad: AROM, 10 reps, Both, Seated Hip Flexion/Marching: AROM, Both, 10 reps, Seated Other Exercises Other Exercises: AROM cervical extension x 5    General Comments General comments (skin integrity, edema, etc.): NGT clamped beginning of session per RN; opened up for intermittent wall suction end of session  per RN      Pertinent Vitals/Pain Pain Assessment Pain Assessment: Faces Faces Pain Scale: Hurts a little bit Pain Location: abdomen Pain Descriptors / Indicators: Discomfort, Guarding Pain Intervention(s): Monitored during session, Repositioned    Home Living                          Prior Function            PT Goals (current goals can now be found in the care plan section) Acute Rehab PT Goals Patient  Stated Goal: return to independent PT Goal Formulation: With patient Time For Goal Achievement: 01/17/24 Potential to Achieve Goals: Fair Progress towards PT goals: Progressing toward goals;Goals updated    Frequency    Min 1X/week      PT Plan      Co-evaluation              AM-PAC PT 6 Clicks Mobility   Outcome Measure  Help needed turning from your back to your side while in a flat bed without using bedrails?: A Little Help needed moving from lying on your back to sitting on the side of a flat bed without using bedrails?: A Little Help needed moving to and from a bed to a chair (including a wheelchair)?: A Little Help needed standing up from a chair using your arms (e.g., wheelchair or bedside chair)?: A Little Help needed to walk in hospital room?: A Little Help needed climbing 3-5 steps with a railing? : Total 6 Click Score: 16    End of Session   Activity Tolerance: Patient tolerated treatment well Patient left: in chair;with call bell/phone within reach;with chair alarm set   PT Visit Diagnosis: Other abnormalities of gait and mobility (R26.89);Muscle weakness (generalized) (M62.81)     Time: 8455-8398 PT Time Calculation (min) (ACUTE ONLY): 17 min  Charges:    $Gait Training: 8-22 mins PT General Charges $$ ACUTE PT VISIT: 1 Visit                     Micheline Portal, PT Acute Rehabilitation Services Office:[phone] 01/03/2024    Montie Portal 01/03/2024, 6:03 PM

## 2024-01-03 NOTE — Progress Notes (Addendum)
 Nutrition Follow-up  DOCUMENTATION CODES:   Severe malnutrition in context of acute illness/injury  INTERVENTION:   - Diet advancement per MD  - TPN per pharmacy (Pharmacy to add MVI to TPN) - Follow up after PEG/PEJ placement   NUTRITION DIAGNOSIS:   Severe Malnutrition (in the context of acute illness) related to  (inadequate energy intake) as evidenced by energy intake < or equal to 50% for > or equal to 5 days, moderate fat depletion, moderate muscle depletion.  - Still applicable   GOAL:   Patient will meet greater than or equal to 90% of their needs  - Meeting via TPN   MONITOR:   PO intake, Supplement acceptance, Labs, Weight trends  REASON FOR ASSESSMENT:   Consult Assessment of nutrition requirement/status  ASSESSMENT:   Pt with hx of HTN, HLD, GERD, and diverticulosis presented for planned surgery after a large duodenal mass was seen on imaging at admission in November and was presumed to be malignant. Underwent pancreaticoduodenectomy and placement of pancreatic duct stent and confirmed tohave adenocarcinoma duodenum.  12/3 - Op, whipple procedure and placement of pancreatic duct stent.  12/10 - Soft diet  12/11 CT: possible post-op seroma or hematoma vs abscess, distal colonic diverticulosis  12/12 - TPN started,  12/13 - CT aspiration and drain placement, NPO 12/15 - NG inserted for persistent emesis  12/18- venous duplex positive for VTE; started on treatment dose lovenox  12/20- NGT clamping trials 12/21- NGT d/c, advanced to clear liquids 12/22- advanced to full liquids 12/23 - clear liquids 12/24 - NPO 12/25 NGT replaced 1/5-1/6 - Pt pulled PICC line, TPN stopped, replaced 1/6 1/10 - Pt pulled NGT, Right drain removed  1/13 - NGT replaced   TPN continuing, still NPO. Pt with pancreatic leak, still having delayed gastric emptying, on Reglan  Q8. NGT reinserted today.  Having nausea. MD allowing sips of clears.  Last BM was 1/13. GOC continuing,  palliative following. Left voicemail for daughters. IR consulted for percutaneous gastric/gastrojejunal tube placement. Planned for 1/15.  Pt's weight has trended down since admit however is now stabilizing since increasing energy needs on 1/3.SABRA Of note pt has been on/off a diet since admission with poor PO intake. Since starting TPN on 12/12 pt has only been able to receive 1257 kCal and 92g AA per day due to Clinimix  shortage. Pt transitioned to compounded TPN 1/3. TPN running at 75 mL/hr to provide 1732 kCal and 99g AA, meeting 100% of nutritional needs.    Admit weight: 55.3 kg - stated?  Current weight: 49.3 kg   Last Weight  Most recent update: 01/03/2024  6:25 AM    Weight  49.3 kg (108 lb 11.2 oz)             Average Meal Intake: NPO   Intake/Output Summary (Last 24 hours) at 01/03/2024 1451 Last data filed at 01/03/2024 1313 Gross per 24 hour  Intake 2267.99 ml  Output 690 ml  Net 1577.99 ml   Drains/Lines: NG: 650 ml x 24 hours Closed system drain 1 L LLQ: 70 ml x 24 hours Closed system drain 1 R RLQ: Removed   Nutritionally Relevant Medications: Scheduled Meds:  Chlorhexidine  Gluconate Cloth  6 each Topical Q0600   enoxaparin  (LOVENOX ) injection  50 mg Subcutaneous Q12H   metoCLOPramide  (REGLAN ) injection  5 mg Intravenous Q8H   metoprolol  tartrate  7.5 mg Intravenous 6 X Daily   nystatin   5 mL Oral QID   pantoprazole  (PROTONIX ) IV  40 mg Intravenous  Q12H   sodium chloride  flush  10-40 mL Intracatheter Q12H   sodium chloride  flush  10-40 mL Intracatheter Q12H   sodium chloride  flush  5 mL Intracatheter Q8H   sucralfate   1 g Per Tube TID   Continuous Infusions:  cefTRIAXone  (ROCEPHIN )  IV Stopped (01/02/24 1714)   metronidazole  Stopped (01/03/24 0700)   TPN ADULT (ION) 75 mL/hr at 01/03/24 1313   TPN ADULT (ION)     Labs Reviewed: Creatinine 0.33, Calcium  8.2,  CBG ranges from 138 mg/dL over the last 24 hours HgbA1c 3.7 (10/2023)  Diet Order:   Diet  Order             Diet NPO time specified  Diet effective midnight           Diet NPO time specified Except for: Ice Chips, Other (See Comments)  Diet effective now                   EDUCATION NEEDS:   Education needs have been addressed  Skin:  Skin Assessment: Skin Integrity Issues: Skin Integrity Issues:: Incisions Incisions: Abdomen  Last BM:  1/13, type 7 x2  Height:   Ht Readings from Last 1 Encounters:  11/23/23 4' 10 (1.473 m)    Weight:   Wt Readings from Last 1 Encounters:  01/03/24 49.3 kg    Ideal Body Weight:  43.9 kg  BMI:  Body mass index is 22.72 kg/m.  Estimated Nutritional Needs:   Kcal:  1700-2000 kcal  Protein:  85-100 gm  Fluid:  1 ml/kcal   Olivia Kenning, RD Registered Dietitian  See Amion for more information

## 2024-01-03 NOTE — Progress Notes (Signed)
 Pt seen in bed with NG appearing to be pulled. RN assessed and noted the tip of the NG hanging out from nare but holding tape intact to nose. Tape removed and NG discarded. Pt assisted to BR and she had a BM x1. Pt denies any nausea or discomfort now. P. Amo Caydn Justen RN

## 2024-01-03 NOTE — Consult Note (Addendum)
 Chief Complaint: Patient was seen in consultation today for protein calorie malnutrition; dysphagia--- for percutaneous gastric/gastrojejunal tube placement at the request of Dr JULIANNA Nephew   Supervising Physician: Adele Rush  Patient Status: [facility] - In-pt  History of Present Illness: Christine Morse is a 88 y.o. female   FULL Code per pts Daughter Heidi  Post Whipple procedure with Dr Nephew 11/23/23 Duodenal adenocarcinoma - final path Pancreatic leak Delayed gastric emptying; NGT out Anemia chronic disease Severe protein calorie malnutrition Will need long term nutrition; long term care  Request made for percutaneous gastric tube/ gastrojejunal tube placement in IR Approved with Dr Adele Planned for Jan 15 in IR   Past Medical History:  Diagnosis Date   Anemia    Arthritis    Cyst of ovary, right 2013   /US  (10/04/2013)   Diverticulitis    Diverticulosis    Duodenal adenoma    GERD (gastroesophageal reflux disease)    History of blood transfusion    just today; my HgB is 5.1 (10/04/2013)   History of gastric ulcer    History of hiatal hernia    HOCM (hypertrophic obstructive cardiomyopathy) (HCC) 10/24/2023   Hyperlipidemia    Hypertension    Mitral regurgitation    moderate MR 10/24/23   Nephrolithiasis    passed them on my own; went away after I quit drinking tea (10/04/2013)   Osteoporosis    Vitamin D deficiency     Past Surgical History:  Procedure Laterality Date   ABDOMINAL HYSTERECTOMY  1981   BIOPSY  10/25/2023   Procedure: BIOPSY;  Surgeon: Abran Rush SAILOR, MD;  Location: [facility];  Service: Gastroenterology;;   ESOPHAGOGASTRODUODENOSCOPY N/A 10/04/2013   Procedure: ESOPHAGOGASTRODUODENOSCOPY (EGD);  Surgeon: Princella CHRISTELLA Nida, MD;  Location: [facility]COPY;  Service: Endoscopy;  Laterality: N/A;   ESOPHAGOGASTRODUODENOSCOPY (EGD) WITH PROPOFOL  N/A 10/25/2023   Procedure: ESOPHAGOGASTRODUODENOSCOPY (EGD) WITH PROPOFOL ;  Surgeon: Abran Rush SAILOR,  MD;  Location: [facility];  Service: Gastroenterology;  Laterality: N/A;   IR US  GUIDE BX ASP/DRAIN  12/24/2023   LAPAROSCOPY N/A 11/23/2023   Procedure: LAPAROSCOPY DIAGNOSTIC;  Surgeon: Nephew Shoulders, MD;  Location: [facility] OR;  Service: General;  Laterality: N/A;  8 HOURS ROOM 10   LEFT OOPHORECTOMY Left 1981   MALONEY DILATION N/A 10/04/2013   Procedure: AGAPITO DILATION;  Surgeon: Princella CHRISTELLA Nida, MD;  Location: [facility] ENDOSCOPY;  Service: Endoscopy;  Laterality: N/A;   WHIPPLE PROCEDURE N/A 11/23/2023   Procedure: WHIPPLE PROCEDURE;  Surgeon: Nephew Shoulders, MD;  Location: [facility] OR;  Service: General;  Laterality: N/A;    Allergies: Codeine  Medications: Prior to Admission medications   Medication Sig Start Date End Date Taking? Authorizing Provider  acetaminophen  (TYLENOL ) 500 MG tablet Take 1,000 mg by mouth every 6 (six) hours as needed for mild pain, moderate pain or headache.   Yes [provider]  Calcium  Carbonate-Vitamin D (CALCIUM  + D PO) Take 2 tablets by mouth daily.    Yes [provider]  ferrous sulfate  325 (65 FE) MG tablet Take 1 tablet (325 mg total) by mouth daily with breakfast. 10/05/13  Yes Sullivan, Corinna L, MD  metoprolol  succinate (TOPROL -XL) 25 MG 24 hr tablet Take 0.5 tablets (12.5 mg total) by mouth daily. 10/27/23 11/26/23 Yes Pahwani, Fredia, MD  pantoprazole  (PROTONIX ) 40 MG tablet Take 1 tablet (40 mg total) by mouth 2 (two) times daily. 11/08/23 05/06/24 Yes Gonfa, Taye T, MD  rosuvastatin  (CRESTOR ) 5 MG tablet Take 5 mg by  mouth daily. 07/10/22  Yes [provider]     Family History  Problem Relation Age of Onset   Heart failure Mother 36   Dementia Father    Ovarian cancer Sister    Cancer Brother        LUNG CANCER   Colon cancer Neg Hx     Social History   Socioeconomic History   Marital status: Widowed    Spouse name: Not on file   Number of children: 2   Years of education: Not on file   Highest education level: Not on file   Occupational History   Occupation: Retired  Tobacco Use   Smoking status: Never   Smokeless tobacco: Never  Vaping Use   Vaping status: Never Used  Substance and Sexual Activity   Alcohol  use: No   Drug use: No   Sexual activity: Never  Other Topics Concern   Not on file  Social History Narrative   Lives at home with granddaughter she raised.     Social Drivers of Corporate Investment Banker Strain: Not on file  Food Insecurity: No Food Insecurity (11/23/2023)   Hunger Vital Sign    Worried About Running Out of Food in the Last Year: Never true    Ran Out of Food in the Last Year: Never true  Transportation Needs: No Transportation Needs (11/23/2023)   PRAPARE - Administrator, Civil Service (Medical): No    Lack of Transportation (Non-Medical): No  Physical Activity: Not on file  Stress: Not on file  Social Connections: Unknown (12/21/2023)   Social Connection and Isolation Panel [NHANES]    Frequency of Communication with Friends and Family: Patient declined    Frequency of Social Gatherings with Friends and Family: Patient declined    Attends Religious Services: Not on Marketing Executive or Organizations: Not on file    Attends Banker Meetings: Patient declined    Marital Status: Patient declined    Review of Systems: A 12 point ROS discussed and pertinent positives are indicated in the HPI above.  All other systems are negative.  Review of Systems  Constitutional:  Positive for activity change, appetite change and fatigue. Negative for fever.  HENT:  Positive for trouble swallowing.   Respiratory:  Negative for shortness of breath.   Cardiovascular:  Negative for chest pain.  Gastrointestinal:  Positive for abdominal pain and nausea.  Neurological:  Positive for weakness.  Psychiatric/Behavioral:  Positive for confusion and decreased concentration.     Vital Signs: BP (!) 152/83 (BP Location: Right Arm)   Pulse 100   Temp  97.9 F (36.6 C) (Oral)   Resp 20   Ht 4' 10 (1.473 m)   Wt 108 lb 11.2 oz (49.3 kg)   SpO2 100%   BMI 22.72 kg/m   Advance Care Plan: The advanced care plan/surrogate decision maker was discussed at the time of visit and documented in the medical record.    Physical Exam Vitals reviewed.  Constitutional:      Appearance: She is ill-appearing.  HENT:     Mouth/Throat:     Mouth: Mucous membranes are dry.  Cardiovascular:     Rate and Rhythm: Normal rate.  Pulmonary:     Breath sounds: Wheezing present.  Abdominal:     Tenderness: There is abdominal tenderness.  Skin:    General: Skin is warm.  Neurological:     Mental Status: She is  alert.     Comments: Spoke to [employer] on phone She consents to IR p[rocedure     Imaging: DG Abd 1 View Result Date: 01/01/2024 CLINICAL DATA:  NG tube placement EXAM: ABDOMEN - 1 VIEW COMPARISON:  12/15/2023 FINDINGS: NG tube is in the stomach.  Nonobstructive bowel gas pattern. IMPRESSION: NG tube in the stomach. Electronically Signed   By: Franky Crease M.D.   On: 01/01/2024 08:48   US  EKG SITE RITE Result Date: 12/27/2023 If Site Rite image not attached, placement could not be confirmed due to current cardiac rhythm.  IR US  Guide Bx Asp/Drain Result Date: 12/25/2023 INDICATION: 88 year old female referred for aspiration of abdominal wall abscess EXAM: ULTRASOUND-GUIDED ASPIRATION OF ABDOMINAL WALL ABSCESS MEDICATIONS: None ANESTHESIA/SEDATION: Moderate (conscious) sedation was not employed during this procedure. COMPLICATIONS: None PROCEDURE: Informed written consent was obtained from the patient after a thorough discussion of the procedural risks, benefits and alternatives. All questions were addressed. Maximal Sterile Barrier Technique was utilized including caps, mask, sterile gowns, sterile gloves, sterile drape, hand hygiene and skin antiseptic. A timeout was performed prior to the initiation of the procedure. The procedure, risks,  benefits, and alternatives were explained to the patient. Questions regarding the procedure were encouraged and answered. The patient understands and consents to the procedure. Ultrasound survey was performed with images stored and sent to PACs. The midline abdomen was prepped with chlorhexidine  in a sterile fashion, and a sterile drape was applied covering the operative field. A sterile gown and sterile gloves were used for the procedure. Local anesthesia was provided with 1% Lidocaine . Ultrasound guidance was used to infiltrate the region with 1% lidocaine  for local anesthesia. 18 gauge needle was advanced into the fluid with aspiration of approximately 30 cc of purulent appearing material. Sent for culture. Final image was stored. Patient tolerated the procedure well and remained hemodynamically stable throughout. No complications were encountered and no significant blood loss was encounter IMPRESSION: Status post ultrasound-guided aspiration of abdominal wall abscess. Signed, Ami RAMAN. Alona ROSALEA GRAVER, RPVI [facility] Radiology Electronically Signed   By: Ami Alona D.O.   On: 12/25/2023 07:23   CT ABDOMEN PELVIS W CONTRAST Result Date: 12/23/2023 CLINICAL DATA:  Postoperative abdominal pain, initial encounter EXAM: CT ABDOMEN AND PELVIS WITH CONTRAST TECHNIQUE: Multidetector CT imaging of the abdomen and pelvis was performed using the standard protocol following bolus administration of intravenous contrast. RADIATION DOSE REDUCTION: This exam was performed according to the departmental dose-optimization program which includes automated exposure control, adjustment of the mA and/or kV according to patient size and/or use of iterative reconstruction technique. CONTRAST:  75mL OMNIPAQUE  IOHEXOL  350 MG/ML SOLN COMPARISON:  12/16/2023 FINDINGS: Lower chest: Small left-sided pleural effusion is noted. Right-sided effusion has resolved in the interval.  Hepatobiliary: Gallbladder has been surgically removed. Scattered hypodensities in the liver are seen stable from prior exam. Pancreas: Postsurgical changes are noted consistent with the given clinical history. Spleen: Normal in size without focal abnormality. Adrenals/Urinary Tract: Adrenal glands are within normal limits. Kidneys demonstrate a normal enhancement pattern bilaterally. Stable nonobstructing calculus is noted within the right kidney measuring 7 mm. Scattered small cysts are noted stable in appearance. No further follow-up is recommended. No obstructive changes are seen. The bladder is partially distended. Stomach/Bowel: Scattered diverticular change of the colon is noted without evidence of diverticulitis. The appendix is not well visualized. No obstructive or inflammatory changes of the colon are seen. Hiatal hernia is noted. Gastric catheter extends into  the stomach. Postsurgical changes are seen consistent with the given clinical history. Multiple surgical drains are noted in the upper abdomen. The left drain demonstrates some mild fluid surrounding this shaft near the in the right abdomen. This is stable in appearance from the prior exam. The other surgical drain shows no fluid collection. Previously noted pigtail catheter along the anterior aspect of the left lobe of the liver shows no significant fluid collection. Small bowel shows no obstructive change although several loops extend into a left inguinal hernia new from the prior exam. Vascular/Lymphatic: Aortic atherosclerosis. No enlarged abdominal or pelvic lymph nodes. Reproductive: Status post hysterectomy. No adnexal masses. Other: No definitive ascites is seen. Mild fluid is noted in the anterior abdominal wall consistent with the recent surgery. Musculoskeletal: No acute or significant osseous findings. IMPRESSION: Status post Whipple with surgical drains as well as a percutaneous drain in place. Minimal fluid is noted adjacent to the tip  of the left-sided drain in the right mid abdomen. Resolution of right-sided effusion.  Stable left effusion is seen. Stable hiatal hernia. Stable nonobstructing right renal stone. Electronically Signed   By: Oneil Devonshire M.D.   On: 12/23/2023 18:58   CT ABDOMEN PELVIS W CONTRAST Result Date: 12/16/2023 CLINICAL DATA:  Status post percutaneous catheter drainage of anterior subcapsular perihepatic fluid collection on 12/03/2023 following recent Whipple procedure. Diminishing output via percutaneous drainage catheter and assessment for residual fluid collection. EXAM: CT ABDOMEN AND PELVIS WITH CONTRAST TECHNIQUE: Multidetector CT imaging of the abdomen and pelvis was performed using the standard protocol following bolus administration of intravenous contrast. RADIATION DOSE REDUCTION: This exam was performed according to the departmental dose-optimization program which includes automated exposure control, adjustment of the mA and/or kV according to patient size and/or use of iterative reconstruction technique. CONTRAST:  50mL OMNIPAQUE  IOHEXOL  350 MG/ML SOLN COMPARISON:  12/01/2023 FINDINGS: Lower chest: Small bilateral pleural effusions of fairly equal volume with associated bibasilar atelectasis. Hepatobiliary: Normal parenchymal enhancement without lesion. No biliary ductal dilatation. Absent gallbladder. Anterior subcapsular percutaneous drainage catheter adjacent to the left lobe with no significant discernible residual fluid collection by CT. Minimal adjacent trace fluid present just to the left of the drain. Pancreas: Status post Whipple procedure with resection of the pancreatic head. The residual body and tail appear unremarkable with no evidence of pancreatic ductal dilatation or peripancreatic fluid collection. Spleen: Normal in size without focal abnormality. Adrenals/Urinary Tract: Adrenal glands are unremarkable. Stable scattered small Bosniak 1 cysts in both kidneys requiring no follow-up and small  nonobstructing bilateral renal calculi. Bladder is unremarkable. Stomach/Bowel: Gastric decompression tube extends below the diaphragm. Moderate hiatal hernia again noted. Postoperative appearance of bowel without obstruction or ileus. No free intraperitoneal air. Vascular/Lymphatic: Stable atherosclerosis of the abdominal aorta without aneurysm. No lymphadenopathy identified. Reproductive: Status post hysterectomy. No adnexal masses. Other: Small amount of free fluid in the pelvis. No new focal abscess. Surgical drain via left abdominal wall terminates in the right abdomen with small amount of adjacent fluid near its tip. Musculoskeletal: No acute or significant osseous findings. IMPRESSION: 1. Anterior subcapsular percutaneous drainage catheter adjacent to the left lobe of the liver with no significant discernible residual fluid collection by CT. Minimal adjacent trace fluid present just to the left of the drain. 2. Status post Whipple procedure with resection of the pancreatic head. The residual body and tail appear unremarkable with no evidence of pancreatic ductal dilatation or peripancreatic fluid collection. 3. Small bilateral pleural effusions of fairly equal volume with associated  bibasilar atelectasis. 4. Small amount of free fluid in the pelvis. 5. Stable small nonobstructing bilateral renal calculi. 6. Stable atherosclerosis of the abdominal aorta without aneurysm. 7. Gastric decompression tube extends below the diaphragm. Moderate hiatal hernia again noted. Aortic Atherosclerosis (ICD10-I70.0). Electronically Signed   By: Marcey Moan M.D.   On: 12/16/2023 14:54   DG Abdomen 1 View Result Date: 12/15/2023 CLINICAL DATA:  NG tube placement EXAM: ABDOMEN - 1 VIEW COMPARISON:  12/14/2023 FINDINGS: Limited field of view for tube placement verification. An enteric tube is present with tip projecting over the left upper quadrant consistent with location in the body of the stomach. A right central  venous catheter is present with tip over the cavoatrial junction region. Surgical drains in the upper abdomen. Skin clips are consistent with recent surgery. Visualized bowel gas pattern is normal. Small left pleural effusion. Degenerative changes in the spine. IMPRESSION: Enteric tube tip projects over the left upper quadrant consistent with location in the body of the stomach. Electronically Signed   By: Elsie Gravely M.D.   On: 12/15/2023 23:36   DG Abd 1 View Result Date: 12/14/2023 CLINICAL DATA:  NGT placement EXAM: ABDOMEN - 1 VIEW COMPARISON:  12/05/2023. FINDINGS: Limited image including only the upper abdomen demonstrates an NG tube tip superimposed with stomach below the diaphragm. IMPRESSION: NG tube in place. Electronically Signed   By: Fonda Field M.D.   On: 12/14/2023 09:44   VAS US  UPPER EXTREMITY VENOUS DUPLEX Result Date: 12/07/2023 UPPER VENOUS STUDY  Patient Name:  SEVANNAH MADIA  Date of Exam:   12/07/2023 Medical Rec #: [id number]       Accession #:    [id number] Date of Birth: 1936/12/10      Patient Gender: F Patient Age:   48 years Exam Location:  [facility] Procedure:      VAS US  UPPER EXTREMITY VENOUS DUPLEX Referring Phys: CHELSEA CONNOR --------------------------------------------------------------------------------  Indications: Swelling Risk Factors: Immobility. Comparison Study: None Performing Technologist: Garnette Rockers  Examination Guidelines: A complete evaluation includes B-mode imaging, spectral Doppler, color Doppler, and power Doppler as needed of all accessible portions of each vessel. Bilateral testing is considered an integral part of a complete examination. Limited examinations for reoccurring indications may be performed as noted.  Right Findings: +----------+------------+---------+-----------+----------+-------+ RIGHT     CompressiblePhasicitySpontaneousPropertiesSummary +----------+------------+---------+-----------+----------+-------+  IJV           Full       Yes       Yes                      +----------+------------+---------+-----------+----------+-------+ Subclavian  Partial      Yes       Yes               Acute  +----------+------------+---------+-----------+----------+-------+ Axillary    Partial      Yes       Yes               Acute  +----------+------------+---------+-----------+----------+-------+ Brachial      Full       Yes       Yes                      +----------+------------+---------+-----------+----------+-------+ Radial        Full       Yes       Yes                      +----------+------------+---------+-----------+----------+-------+  Ulnar         Full       Yes       Yes                      +----------+------------+---------+-----------+----------+-------+ Cephalic      Full       Yes       Yes                      +----------+------------+---------+-----------+----------+-------+ Basilic       None       No        No                Acute  +----------+------------+---------+-----------+----------+-------+ Acute Thrombosis surrounding PICC line from Basilic V, through Axial V, and into the Subclavian V.  Left Findings: +----------+------------+---------+-----------+----------+-------+ LEFT      CompressiblePhasicitySpontaneousPropertiesSummary +----------+------------+---------+-----------+----------+-------+ Subclavian    Full       Yes       Yes                      +----------+------------+---------+-----------+----------+-------+  Summary:  Right: Findings consistent with acute deep vein thrombosis involving the right subclavian vein and right axillary vein. Findings consistent with acute superficial vein thrombosis involving the right basilic vein. Acute Thrombosis surrounding PICC line from Basilic V, through Axial V, and into the Subclavian V.  Left: No evidence of thrombosis in the subclavian.  *See table(s) above for measurements and  observations.  Diagnosing physician: Penne Colorado MD Electronically signed by Penne Colorado MD on 12/07/2023 at 1:37:50 PM.    Final    DG Abdomen 1 View Result Date: 12/05/2023 CLINICAL DATA:  88 year old female status post nasogastric tube placement. EXAM: ABDOMEN - 1 VIEW COMPARISON:  Abdominal radiograph 12/05/2023. FINDINGS: Nasogastric tube tip is in the antral pre-pyloric region of the stomach. Midline surgical staples. Surgical drain projecting over the abdomen with tip projecting over the right iliac crest. Small bore drainage catheter with pigtail reformed over the right upper quadrant of the abdomen. Paucity of bowel gas. IMPRESSION: 1. Support apparatus and postoperative changes, as above. Electronically Signed   By: Toribio Aye M.D.   On: 12/05/2023 08:03   DG Abdomen 1 View Result Date: 12/05/2023 CLINICAL DATA:  NG tube EXAM: ABDOMEN - 1 VIEW COMPARISON:  Abdominal x-ray 11/27/2023 FINDINGS: Nasogastric tube tip is at the level of the mid stomach. Midline skin staples and surgical drain are seen in the mid abdomen. Bowel-gas pattern is nonobstructive. IMPRESSION: Nasogastric tube tip is at the level of the mid stomach. Electronically Signed   By: Greig Pique M.D.   On: 12/05/2023 01:13    Labs:  CBC: Recent Labs    12/28/23 0539 12/29/23 0526 12/30/23 0430 01/01/24 0345  WBC 8.9 8.9 9.1 8.6  HGB 9.2* 9.6* 8.9* 10.2*  HCT 29.3* 31.1* 28.4* 32.5*  PLT 512* 519* 474* 554*    COAGS: Recent Labs    11/24/23 0405 11/25/23 0510 12/03/23 0544 01/03/24 0651  INR 1.4* 1.3* 1.2 1.1    BMP: Recent Labs    12/30/23 0430 12/31/23 0502 01/01/24 0345 01/03/24 0651  NA 134* 133* 135 135  K 4.0 4.1 4.2 3.9  CL 100 100 99 102  CO2 28 26 28 29   GLUCOSE 139* 131* 138* 145*  BUN 17 15 16 16   CALCIUM  8.2* 8.3* 8.5* 8.2*  CREATININE 0.34* <0.30* 0.40* 0.33*  GFRNONAA >60  NOT CALCULATED >60 >60    LIVER FUNCTION TESTS: Recent Labs    12/23/23 0616 12/27/23 0430  12/30/23 0430 01/03/24 0651  BILITOT 0.2 0.3 0.3 0.4  AST 14* 20 27 22   ALT 17 21 42 30  ALKPHOS 128* 209* 150* 99  PROT 5.7* 5.5* 5.4* 5.6*  ALBUMIN  2.3* 2.0* 2.1* 2.3*    TUMOR MARKERS: No results for input(s): AFPTM, CEA, CA199, CHROMGRNA in the last 8760 hours.  Assessment and Plan:  Dysphagia Protein calorie malnutrition Long term needs-- long term care Scheduled for percutaneous gastric / gastrojejunal tube placement in IR 1/15 Risks and benefits image guided gastrostomy tube placement was discussed with the patient's Dtr Heidi via phone including, but not limited to the need for a barium enema during the procedure, bleeding, infection, peritonitis and/or damage to adjacent structures.  All of the patient's questions were answered, patient is agreeable to proceed.  Consent signed and in chart.   Thank you for this interesting consult.  I greatly enjoyed meeting AIANA NORDQUIST and look forward to participating in their care.  A copy of this report was sent to the requesting provider on this date.  Electronically Signed: Sharlet DELENA Candle, PA-C 01/03/2024, 10:30 AM   I spent a total of 20 Minutes    in face to face in clinical consultation, greater than 50% of which was counseling/coordinating care for Gastric/gastrojejunal tube placement

## 2024-01-03 NOTE — Progress Notes (Signed)
 Daily Progress Note   Patient Name: Christine Morse       Date: 01/03/2024 DOB: Jun 25, 1936  Age: 88 y.o. MRN#: [id number] Attending Physician: Aron Shoulders, MD Primary Care Physician: Waylan Almarie SAUNDERS, MD Admit Date: 11/23/2023  Reason for Consultation/Follow-up: Establishing goals of care  Subjective: I have reviewed medical records including EPIC notes, MAR, any available advanced directives as necessary, and labs. Plan is for attempt at G/J tube placement with IR today. Received report from primary RN - no acute concerns. RN reports patient is more fatigued today and that NG was accidentally removed by patient this morning.   Went to visit patient at bedside - no family/visitors present. Patient was lying in bed - she does not fully wake/open her eyes for interaction to voice/touch - she is very lethargic. No signs or non-verbal gestures of pain or discomfort noted. No respiratory distress, increased work of breathing, or secretions noted. She is weak, ill, and frail appearing.  9:51 AM Attempted to call daughter/Heidi for ongoing support and possibly schedule family meeting - no answer - confidential voicemail left and PMT phone number provided with request to return call.  9:52 AM Attempted to call daughter/Beverly for ongoing support and possibly schedule family meeting - no answer - unable to leave VM as mailbox was full.  Requested RN reach out to PMT if family arrive to bedside.   Length of Stay: 41  Current Medications: Scheduled Meds:   Chlorhexidine  Gluconate Cloth  6 each Topical Q0600   enoxaparin  (LOVENOX ) injection  50 mg Subcutaneous Q12H   metoCLOPramide  (REGLAN ) injection  5 mg Intravenous Q8H   metoprolol  tartrate  7.5 mg Intravenous 6 X Daily   nystatin   5 mL Oral  QID   pantoprazole  (PROTONIX ) IV  40 mg Intravenous Q12H   sodium chloride  flush  10-40 mL Intracatheter Q12H   sodium chloride  flush  10-40 mL Intracatheter Q12H   sodium chloride  flush  5 mL Intracatheter Q8H   sucralfate   1 g Per Tube TID    Continuous Infusions:  cefTRIAXone  (ROCEPHIN )  IV Stopped (01/02/24 1714)   metronidazole  500 mg (01/03/24 0428)   TPN ADULT (ION) 75 mL/hr at 01/02/24 2002   TPN ADULT (ION)      PRN Meds: acetaminophen , fentaNYL  (SUBLIMAZE ) injection, guaiFENesin -dextromethorphan , haloperidol  lactate, ipratropium-albuterol , methocarbamol  (ROBAXIN ) injection,  ondansetron  (ZOFRAN ) IV, mouth rinse, prochlorperazine  **OR** prochlorperazine , sodium chloride  flush, sodium chloride  flush  Physical Exam Vitals and nursing note reviewed.  Constitutional:      General: She is not in acute distress.    Appearance: She is ill-appearing.  Pulmonary:     Effort: No respiratory distress.  Skin:    General: Skin is warm and dry.  Neurological:     Mental Status: She is lethargic.     Motor: Weakness present.             Vital Signs: BP (!) 152/83 (BP Location: Right Arm)   Pulse 100   Temp 97.9 F (36.6 C) (Oral)   Resp 20   Ht 4' 10 (1.473 m)   Wt 49.3 kg   SpO2 100%   BMI 22.72 kg/m  SpO2: SpO2: 100 % O2 Device: O2 Device: Room Air O2 Flow Rate: O2 Flow Rate (L/min): 2 L/min  Intake/output summary:  Intake/Output Summary (Last 24 hours) at 01/03/2024 9062 Last data filed at 01/03/2024 9383 Gross per 24 hour  Intake 2393.9 ml  Output 650 ml  Net 1743.9 ml   LBM: Last BM Date : 01/01/24 Baseline Weight: Weight: 55.3 kg Most recent weight: Weight: 49.3 kg       Palliative Assessment/Data: PPS 30-40%      Patient Active Problem List   Diagnosis Date Noted   Protein-calorie malnutrition, severe 12/01/2023   Duodenal adenoma 11/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   Upper GI bleed 11/08/2023   Hyponatremia 11/07/2023   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    Positive fecal occult blood test 10/26/2023   Duodenal mass 10/25/2023   Symptomatic anemia 10/24/2023   Nonrheumatic aortic valve stenosis 07/30/2022   Abnormal stress test 07/30/2022   Pyelonephritis 09/19/2017   Essential hypertension 09/19/2017   Hyperlipidemia 09/19/2017   Pulmonary vascular congestion 09/19/2017   Cameron ulcer 10/05/2013   Iron  deficiency anemia due to chronic blood loss 10/04/2013   Other specified gastritis without mention of hemorrhage 10/04/2013   Chest tightness 10/03/2013   Vitamin D deficiency 04/08/2009   GERD 03/26/2009   Diverticulosis of colon 03/26/2009   DIARRHEA 03/26/2009   NEPHROLITHIASIS, HX OF 03/26/2009    Palliative Care Assessment & Plan   Patient Profile: 88 y.o. female  with past medical history of chronic blood loss anemia, aortic stenosis, HTN, HLD, GERD, diverticular disease, and recent diagnosis of duodenal mass suspected to be invasive cancer with plans for a Whipple procedure admitted on 11/23/2023 with shortness of breath with little exertion and lower extremity edema.    Patient had Whipple Procedure 11/23/2023 (POD 40). Final pathology - adenocarcinoma duodenum, positive margin, positive LN - pT3pN2cM0. Since then she has had delayed gastric emptying and a pancreatic leak. She currently has a NG tube in place. She is on TPN.    Assessment: Principal Problem:   Duodenal adenoma Active Problems:   Protein-calorie malnutrition, severe   Concern about end of life  Recommendations/Plan: Continue to treat the treatable  Continue full code for now Patient was supposed to have outpatient follow up with Oncology the first week in January 2025 but missed this appointment due to admission. Would recommend Oncology follow up inpatient for further insight into options, if any. This would assist patient/family with ongoing GOC Attempted to reach family to schedule family meeting - no answer - VM left PMT will continue to follow and  support holistically  Goals of Care and Additional Recommendations: Limitations on Scope of Treatment: Full Scope Treatment  Code  Status:    Code Status Orders  (From admission, onward)           Start     Ordered   11/23/23 1655  Full code  Continuous       Question:  By:  Answer:  Other   11/23/23 1654           Code Status History     Date Active Date Inactive Code Status Order ID Comments User Context   11/07/2023 2346 11/08/2023 1709 Full Code [id number]  Sim Emery CROME, MD ED   10/24/2023 0232 10/27/2023 1959 Full Code [id number]  Shona Terry SAILOR, DO ED   09/19/2017 0332 09/20/2017 1616 Full Code [id number]  Celinda Alm Lot, MD ED   10/04/2013 0102 10/05/2013 1249 Full Code [id number]  Lonzell Emeline HERO, DO Inpatient      Advance Directive Documentation    Flowsheet Row Most Recent Value  Type of Advance Directive Healthcare Power of Attorney, Living will  Pre-existing out of facility DNR order (yellow form or pink MOST form) --  MOST Form in Place? --       Prognosis:  Overall likely poor prognosis  Discharge Planning: To Be Determined  Care plan was discussed with primary RN  Thank you for allowing the Palliative Medicine Team to assist in the care of this patient.   Total Time 40 minutes Prolonged Time Billed  no       Jeoffrey HERO Sharps, NP  Please contact Palliative Medicine Team phone at [phone] for questions and concerns.   *Portions of this note are a verbal dictation therefore any spelling and/or grammatical errors are due to the Dragon Medical One system interpretation.

## 2024-01-03 NOTE — Progress Notes (Signed)
 Pt report not feeling well, fatigue and nausea. NT tube reinserted to right nare at the marking of 60cm; per discussion and verbal instructed by Dr. Aron during morning rounds. Prn Zofran  given. Pt laying in bed with call light within reach and bed alarm on. P. Amo Mildred Bollard RN

## 2024-01-04 DIAGNOSIS — Z7189 Other specified counseling: Secondary | ICD-10-CM

## 2024-01-04 DIAGNOSIS — Z66 Do not resuscitate: Secondary | ICD-10-CM

## 2024-01-04 DIAGNOSIS — Z515 Encounter for palliative care: Secondary | ICD-10-CM | POA: Diagnosis not present

## 2024-01-04 DIAGNOSIS — D132 Benign neoplasm of duodenum: Secondary | ICD-10-CM | POA: Diagnosis not present

## 2024-01-04 LAB — BASIC METABOLIC PANEL
Anion gap: 5 (ref 5–15)
BUN: 15 mg/dL (ref 8–23)
CO2: 27 mmol/L (ref 22–32)
Calcium: 8.1 mg/dL — ABNORMAL LOW (ref 8.9–10.3)
Chloride: 107 mmol/L (ref 98–111)
Creatinine, Ser: 0.37 mg/dL — ABNORMAL LOW (ref 0.44–1.00)
GFR, Estimated: >60 mL/min (ref >60)
Glucose, Bld: 139 mg/dL — ABNORMAL HIGH (ref 70–99)
Potassium: 3.9 mmol/L (ref 3.5–5.1)
Sodium: 139 mmol/L (ref 135–145)

## 2024-01-04 LAB — CBC
HCT: 35.1 % — ABNORMAL LOW (ref 36.0–46.0)
Hemoglobin: 11 g/dL — ABNORMAL LOW (ref 12.0–15.0)
MCH: 28.8 pg (ref 26.0–34.0)
MCHC: 31.3 g/dL (ref 30.0–36.0)
MCV: 91.9 fL (ref 80.0–100.0)
Platelets: 677 10*3/uL — ABNORMAL HIGH (ref 150–400)
RBC: 3.82 MIL/uL — ABNORMAL LOW (ref 3.87–5.11)
RDW: 17.4 % — ABNORMAL HIGH (ref 11.5–15.5)
WBC: 13.3 10*3/uL — ABNORMAL HIGH (ref 4.0–10.5)
nRBC: 0 % (ref 0.0–0.2)

## 2024-01-04 LAB — PROTIME-INR
INR: 1 (ref 0.8–1.2)
Prothrombin Time: 13.7 s (ref 11.4–15.2)

## 2024-01-04 LAB — GLUCOSE, CAPILLARY: Glucose-Capillary: 153 mg/dL — ABNORMAL HIGH (ref 70–99)

## 2024-01-04 MED ORDER — INSULIN ASPART 100 UNIT/ML IJ SOLN
0.0000 [IU] | INTRAMUSCULAR | Status: AC
  Administered 2024-01-04: 2 [IU] via SUBCUTANEOUS
  Administered 2024-01-05 (×2): 1 [IU] via SUBCUTANEOUS

## 2024-01-04 MED ORDER — TRAVASOL 10 % IV SOLN
INTRAVENOUS | Status: AC
  Filled 2024-01-04: qty 990

## 2024-01-04 NOTE — Progress Notes (Signed)
 Daily Progress Note   Patient Name: Christine Morse       Date: 01/04/2024 DOB: 08/28/1936  Age: 88 y.o. MRN#: [id number] Attending Physician: Aron Shoulders, MD Primary Care Physician: Waylan Almarie SAUNDERS, MD Admit Date: 11/23/2023  Reason for Consultation/Follow-up: Establishing goals of care  Subjective: I have reviewed medical records including EPIC notes, MAR, any available advanced directives as necessary, and labs. Received report from primary RN - no acute concerns.  Went to visit patient at bedside - no family/visitors present. Patient was sitting up in chair awake, alert, but weak and frail. She answers most questions with nodding/shaking her head to yes/no; when she does speak it is weak and quiet. No signs or non-verbal gestures of pain or discomfort noted. No respiratory distress, increased work of breathing, or secretions noted. She denies pain.   Emotional support provided to patient. We briefly reviewed options of continuing aggressive interventions vs comfort care in light of her goals of care. Patient is ok pursing PEG tube.   Again discussed code status in context of her current situation. Encouraged patient to consider DNR/DNI status understanding evidenced based poor outcomes in similar hospitalized patient, as the cause of arrest is likely associated with advanced chronic/terminal illness rather than an easily reversible acute cardio-pulmonary event.  I shared that even if we pursued resuscitation we would not able to resolve the underlying factors. I explained that DNR/DNI does not change the medical plan and it only comes into effect after a person has arrested (died).  It is a protective measure to keep us  from harming the patient in their last moments of life. Patient opted for  code status change to DNR/DNI with understanding that she would not receive CPR, defibrillation, ACLS medications, or intubation. Patient indicates she wants a peaceful, natural passing when it is her time.  Assisted patient with TV set up. She gives permission to give her daughters updates regarding code status change.  12:43 PM Called daughter/Christine Morse - emotional support provided. Christine Morse has just left the hospital from visiting patient.  Provided updates per my assessment and conversation with patient at noted above. Christine Morse is supportive of patient's decision for DNR/DNI. Answered questions regarding discharge timing: goal is for rehab after PEG placement when medically stable. Reviewed Surgery notes to answer questions regarding drains per her request. Reviewed active  medication list and discussed use of Robaxin  as needed.  Christine Morse explains that family will be bringing patient's advanced directive documents tomorrow - will follow up to obtain copies.  All questions and concerns addressed. Encouraged to call with questions and/or concerns. PMT card previously provided.  Length of Stay: 42  Current Medications: Scheduled Meds:   Chlorhexidine  Gluconate Cloth  6 each Topical Q0600   enoxaparin  (LOVENOX ) injection  50 mg Subcutaneous Q12H   insulin  aspart  0-9 Units Subcutaneous 4 times per day   metoCLOPramide  (REGLAN ) injection  5 mg Intravenous Q8H   metoprolol  tartrate  7.5 mg Intravenous 6 X Daily   nystatin   5 mL Oral QID   pantoprazole  (PROTONIX ) IV  40 mg Intravenous Q12H   sodium chloride  flush  10-40 mL Intracatheter Q12H   sodium chloride  flush  10-40 mL Intracatheter Q12H   sodium chloride  flush  5 mL Intracatheter Q8H   sucralfate   1 g Per Tube TID    Continuous Infusions:  cefTRIAXone  (ROCEPHIN )  IV Stopped (01/03/24 1657)   metronidazole  500 mg (01/04/24 0411)   TPN ADULT (ION) 75 mL/hr at 01/03/24 1832   TPN CYCLIC-ADULT (ION)      PRN Meds: acetaminophen , fentaNYL   (SUBLIMAZE ) injection, guaiFENesin -dextromethorphan , haloperidol  lactate, ipratropium-albuterol , methocarbamol  (ROBAXIN ) injection, ondansetron  (ZOFRAN ) IV, mouth rinse, prochlorperazine  **OR** prochlorperazine , sodium chloride  flush, sodium chloride  flush  Physical Exam Vitals and nursing note reviewed.  Constitutional:      General: She is not in acute distress.    Appearance: She is ill-appearing.  Pulmonary:     Effort: No respiratory distress.  Skin:    General: Skin is warm and dry.  Neurological:     Mental Status: She is alert and oriented to person, place, and time.     Motor: Weakness present.  Psychiatric:        Attention and Perception: Attention normal.        Mood and Affect: Affect is flat.        Behavior: Behavior is slowed. Behavior is cooperative.        Cognition and Memory: Cognition and memory normal.             Vital Signs: BP (!) 142/71 (BP Location: Right Arm)   Pulse 100   Temp 98.7 F (37.1 C) (Oral)   Resp 19   Ht 4' 10 (1.473 m)   Wt 49.3 kg   SpO2 99%   BMI 22.72 kg/m  SpO2: SpO2: 99 % O2 Device: O2 Device: Room Air O2 Flow Rate: O2 Flow Rate (L/min): 2 L/min  Intake/output summary:  Intake/Output Summary (Last 24 hours) at 01/04/2024 1226 Last data filed at 01/04/2024 9391 Gross per 24 hour  Intake 1881.28 ml  Output 400 ml  Net 1481.28 ml   LBM: Last BM Date : 01/03/24 Baseline Weight: Weight: 55.3 kg Most recent weight: Weight: 49.3 kg       Palliative Assessment/Data: PPS 50%      Patient Active Problem List   Diagnosis Date Noted   Protein-calorie malnutrition, severe 12/01/2023   Duodenal adenoma 11/23/2023   Upper GI bleed 11/08/2023   Hyponatremia 11/07/2023   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   Positive fecal occult blood test 10/26/2023   Duodenal mass 10/25/2023   Symptomatic anemia 10/24/2023   Nonrheumatic aortic valve stenosis 07/30/2022   Abnormal stress test 07/30/2022   Pyelonephritis 09/19/2017    Essential hypertension 09/19/2017   Hyperlipidemia 09/19/2017   Pulmonary vascular congestion 09/19/2017   Ole  ulcer 10/05/2013   Iron  deficiency anemia due to chronic blood loss 10/04/2013   Other specified gastritis without mention of hemorrhage 10/04/2013   Chest tightness 10/03/2013   Vitamin D deficiency 04/08/2009   GERD 03/26/2009   Diverticulosis of colon 03/26/2009   DIARRHEA 03/26/2009   NEPHROLITHIASIS, HX OF 03/26/2009    Palliative Care Assessment & Plan   Patient Profile: 88 y.o. female  with past medical history of chronic blood loss anemia, aortic stenosis, HTN, HLD, GERD, diverticular disease, and recent diagnosis of duodenal mass suspected to be invasive cancer with plans for a Whipple procedure admitted on 11/23/2023 with shortness of breath with little exertion and lower extremity edema.    Patient had Whipple Procedure 11/23/2023 (POD 40). Final pathology - adenocarcinoma duodenum, positive margin, positive LN - pT3pN2cM0. Since then she has had delayed gastric emptying and a pancreatic leak. She currently has a NG tube in place. She is on TPN.  Assessment: Principal Problem:   Duodenal adenoma Active Problems:   Protein-calorie malnutrition, severe   Concern about end of life  Recommendations/Plan: Continue full scope care - proceed with PEG Now DNR/DNI Goal is for discharge to rehab Family bringing advanced directives tomorrow - will obtain copies PMT will continue to follow and support holistically  Goals of Care and Additional Recommendations: Limitations on Scope of Treatment: Full Scope Treatment  Code Status:    Code Status Orders  (From admission, onward)           Start     Ordered   11/23/23 1655  Full code  Continuous       Question:  By:  Answer:  Other   11/23/23 1654           Code Status History     Date Active Date Inactive Code Status Order ID Comments User Context   11/07/2023 2346 11/08/2023 1709 Full Code  [id number]  Sim Emery CROME, MD ED   10/24/2023 0232 10/27/2023 1959 Full Code [id number]  Shona Terry SAILOR, DO ED   09/19/2017 0332 09/20/2017 1616 Full Code [id number]  Celinda Alm Lot, MD ED   10/04/2013 0102 10/05/2013 1249 Full Code [id number]  Lonzell Emeline HERO, DO Inpatient      Advance Directive Documentation    Flowsheet Row Most Recent Value  Type of Advance Directive Healthcare Power of Attorney, Living will  Pre-existing out of facility DNR order (yellow form or pink MOST form) --  MOST Form in Place? --       Prognosis:  Unable to determine  Discharge Planning: To Be Determined  Care plan was discussed with primary RN, patient, patient's daughter  Thank you for allowing the Palliative Medicine Team to assist in the care of this patient.   Total Time 50 minutes Prolonged Time Billed  no       Jeoffrey HERO Sharps, NP  Please contact Palliative Medicine Team phone at [phone] for questions and concerns.   *Portions of this note are a verbal dictation therefore any spelling and/or grammatical errors are due to the Dragon Medical One system interpretation.

## 2024-01-04 NOTE — Progress Notes (Addendum)
 PHARMACY - TOTAL PARENTERAL NUTRITION CONSULT NOTE  Indication:  delayed gastric emptying  Patient Measurements: Height: 4' 10 (147.3 cm) Weight: 49.3 kg (108 lb 11.2 oz) IBW/kg (Calculated) : 40.9 TPN AdjBW (KG): 44.5 Body mass index is 22.72 kg/m. 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  Patient was started on a CLD on 12/6-12/9 and then advanced to a soft diet since 12/10.  Patient has had minimal PO intake since being post-op and continues to experience nausea. Delayed gastric emptying likely related to possible pancreatic leak.  Pharmacy consulted to dose TPN.  Glucose / Insulin : A1c 3.7 (11/15/23) - glucose <180, no SSI Electrolytes: all WNL Renal: SCr < 1, BUN WNL Hepatic: AST/ALT/tbili WNL, alk phos wnl, albumin  2.3, TG 90 Intake / Output; MIVF: UOP 4x unmeasured, NGT 450 mL, drain 40 mL, LBM 1/13. Net I/O not accurate given unmeasured UOP.  GI Imaging: 12/7 XR: unremarkable bowel gas pattern. 12/11 CT: possible post-op seroma or hematoma vs abscess, distal colonic diverticulosis 12/15 XR: expected post-op changes, NGT in correct place 12/26 CT: perc drain cath adj left lobe liver, no discernable residual fluid collection. Post whipple w/ no evidence pancreatic ductal dilatation or peripancreatic fluid collection, small amount of free fluid in pelvis 1/2 CT - minimal fluid near drain in abdomen  1/11, 1/13 confirmed enteric tube placement  GI Surgeries / Procedures:  12/3 diagnostic laparoscopy, Whipple procedure, pancreatic duct stent 12/13 subhepatic fluid collection aspiration and drainage 1/3 IR US  drain placement of abdominal abscess   Central access: PICC replaced 12/27/23 TPN start date: 12/02/23  Nutritional Goals: RD Estimated Needs Total Energy Estimated Needs: 1700-2000 kcal Total Protein Estimated Needs: 85-100  gm Total Fluid Estimated Needs: 1 ml/kcal  Current Nutrition:  TPN  Plan:  Begin cycling TPN today to 18 hours from 24 hours. Likely only able to cycle down to 14 hours per day to be under 7 mg/kg/min GIR. Will also consider concentrated AA to clinisol 15%, continue 10% travasol  for now.  1800 mL TPN rate 53-106 mL/hr to provide 1732 kCal and 99g AA, meeting 100% of nutritional needs.  Electrolytes in TPN: Na 68mEq/L, K 64mEq/L, Ca 96mEq/L, Mg 31mEq/L, Phos 15mmol/L, change to Cl:Ac 1:1 on 1/14.  Monitor TPN labs on Mon/Thurs BMP tomorrow since cycling  Add sensitive sliding scale and CBGs 1 hr after TPN starts, one at peak, one at 1 hr after TPN stops and 1 while off  IR consulted for percutaneous gastric/gastrojejunal tube placement. Planned for 1/15.   Rankin Sams, PharmD, BCPS, BCCCP Clinical Pharmacist

## 2024-01-04 NOTE — Plan of Care (Signed)

## 2024-01-04 NOTE — Progress Notes (Signed)
 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 post-op subcapsular fluid collection around the anterior aspect of the left liver lobe. Drain placement in IR 12/03/23. NGT inserted 12/15, dislodged then replaced.  Positive for R subclavian and axillary DVT on 12/07/23. 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   OT comments  Patient demonstrating good progress with OT treatment with CGA for bed mobility, sit to stands and transfers. Patient able to stand at sink for 6 minutes for grooming tasks with supervision. AE training performed seated/standing from recliner with poor recall from previous session. Patient will benefit from continued inpatient follow up therapy, <3 hours/day with acute OT to continue to follow for bathing, dressing, and functional transfers.       If plan is discharge home, recommend the following:  A lot of help with bathing/dressing/bathroom;A little help with walking and/or transfers;Assist for transportation;Assistance with cooking/housework;Help with stairs or ramp for entrance   Equipment Recommendations  None recommended by OT    Recommendations for Other Services      Precautions / Restrictions Precautions Precautions: Fall Precaution Comments: NG tube, L LQ Blake drain Restrictions Weight Bearing Restrictions Per Provider Order: No       Mobility Bed Mobility Overal bed mobility: Needs Assistance Bed Mobility: Rolling, Sidelying to Sit Rolling: Contact guard assist Sidelying to sit: Contact guard assist, Used rails       General bed mobility comments: assistance with trunk    Transfers Overall transfer level: Needs assistance Equipment used: Rolling walker (2 wheels) Transfers: Sit  to/from Stand, Bed to chair/wheelchair/BSC Sit to Stand: Contact guard assist, From elevated surface     Step pivot transfers: Contact guard assist     General transfer comment: CGA to stand from elevated bed, min assist from recliner     Balance Overall balance assessment: Needs assistance Sitting-balance support: Feet supported Sitting balance-[name]: Good Sitting balance - Comments: EOB   Standing balance support: Single extremity supported, Bilateral upper extremity supported, During functional activity Standing balance-[name]: Poor Standing balance comment: UE support for balance                           ADL either performed or assessed with clinical judgement   ADL Overall ADL's : Needs assistance/impaired     Grooming: Wash/dry hands;Wash/dry face;Oral care;Brushing hair;Supervision/safety;Standing Grooming Details (indicate cue type and reason): stood at sink for 6 minutes for grooming tasks         Upper Body Dressing : Minimal assistance;Standing Upper Body Dressing Details (indicate cue type and reason): gown for back Lower Body Dressing: Moderate assistance;Sit to/from stand Lower Body Dressing Details (indicate cue type and reason): education on reacher use for LB dressing with mod assist for pull up brief Toilet Transfer: Contact guard assist;Ambulation Toilet Transfer Details (indicate cue type and reason): simulated to recliner                Extremity/Trunk Assessment              Vision       Perception     Praxis      Cognition Arousal: Alert Behavior During Therapy: Flat affect Overall Cognitive Status: No family/caregiver present to determine baseline cognitive functioning Area of  Impairment: Memory, Safety/judgement, Problem solving                     Memory: Decreased short-term memory   Safety/Judgement: Decreased awareness of deficits   Problem Solving: Slow processing General Comments:  limited verbalization but would answer questions and agreeable to OOB        Exercises      Shoulder Instructions       General Comments NGT clamped by RN    Pertinent Vitals/ Pain       Pain Assessment Pain Assessment: Faces Faces Pain Scale: Hurts a little bit Pain Location: abdomen Pain Descriptors / Indicators: Discomfort, Guarding Pain Intervention(s): Monitored during session, Repositioned  Home Living                                          Prior Functioning/Environment              Frequency  Min 1X/week        Progress Toward Goals  OT Goals(current goals can now be found in the care plan section)  Progress towards OT goals: Progressing toward goals  Acute Rehab OT Goals Patient Stated Goal: feel better OT Goal Formulation: With patient Time For Goal Achievement: 01/17/24 Potential to Achieve Goals: Good ADL Goals Pt Will Perform Grooming: with supervision;standing Pt Will Perform Upper Body Bathing: with set-up;sitting Pt Will Perform Lower Body Bathing: sit to/from stand;with contact guard assist Pt Will Perform Lower Body Dressing: with contact guard assist;sit to/from stand Pt Will Transfer to Toilet: with supervision;ambulating;regular height toilet  Plan      Co-evaluation                 AM-PAC OT 6 Clicks Daily Activity     Outcome Measure   Help from another person eating meals?: A Little Help from another person taking care of personal grooming?: A Little Help from another person toileting, which includes using toliet, bedpan, or urinal?: A Lot Help from another person bathing (including washing, rinsing, drying)?: A Lot Help from another person to put on and taking off regular upper body clothing?: A Little Help from another person to put on and taking off regular lower body clothing?: A Lot 6 Click Score: 15    End of Session Equipment Utilized During Treatment: Rolling walker (2 wheels)  OT Visit  Diagnosis: Unsteadiness on feet (R26.81);Muscle weakness (generalized) (M62.81)   Activity Tolerance Patient tolerated treatment well   Patient Left in chair;with call bell/phone within reach;with chair alarm set   Nurse Communication Mobility status        Time: 9281-9258 OT Time Calculation (min): 23 min  Charges: OT General Charges $OT Visit: 1 Visit OT Treatments $Self Care/Home Management : 23-37 mins  Christine Morse, OTA Acute Rehabilitation Services  Office [phone]   Jeb LITTIE Morse 01/04/2024, 7:57 AM

## 2024-01-04 NOTE — Progress Notes (Signed)
 42 Days Post-Op   Subjective/Chief Complaint: NGT replaced for n/v.   Objective: Vital signs in last 24 hours: Temp:  [97.6 F (36.4 C)-98.7 F (37.1 C)] 98.7 F (37.1 C) (01/14 0330) Pulse Rate:  [85-100] 100 (01/14 0330) Resp:  [16-20] 19 (01/14 0330) BP: (123-154)/(71-77) 154/74 (01/14 1225) SpO2:  [95 %-99 %] 99 % (01/14 0330) Last BM Date : 01/03/24  Intake/Output from previous day: 01/13 0701 - 01/14 0700 In: 1881.3 [I.V.:1581.3; IV Piggyback:300] Out: 440 [Emesis/NG output:400; Drains:40] Intake/Output this shift: No intake/output data recorded.  Physical Exam:  General appearance: OOB in chair.  Weak but alert Resp: breathing comfortably GI: soft, non distended. No rigidity or guarding. Midline wound with panc drainage             D1 - LLQ - 70 cc -purulent appearing             D2 RLQ - removed 1/10 Extremities: tr LE edema  Lab Results:  Recent Labs    01/04/24 1122  WBC 13.3*  HGB 11.0*  HCT 35.1*  PLT 677*    BMET Recent Labs    01/03/24 0651 01/04/24 0507  NA 135 139  K 3.9 3.9  CL 102 107  CO2 29 27  GLUCOSE 145* 139*  BUN 16 15  CREATININE 0.33* 0.37*  CALCIUM  8.2* 8.1*   PT/INR Recent Labs    01/03/24 0651 01/04/24 1122  LABPROT 14.0 13.7  INR 1.1 1.0     ABG No results for input(s): PHART, HCO3 in the last 72 hours.  Invalid input(s): PCO2, PO2   Studies/Results:  Anti-infectives: Anti-infectives (From admission, onward)    Start     Dose/Rate Route Frequency Ordered Stop   12/29/23 1630  cefTRIAXone  (ROCEPHIN ) 2 g in sodium chloride  0.9 % 100 mL IVPB       Note to Pharmacy: Pharmacy may adjust dosing strength / duration / interval for maximal efficacy   2 g 200 mL/hr over 30 Minutes Intravenous Every 24 hours 12/29/23 1536     12/29/23 1630  metroNIDAZOLE  (FLAGYL ) IVPB 500 mg        500 mg 100 mL/hr over 60 Minutes Intravenous Every 12 hours 12/29/23 1536     12/24/23 1000  piperacillin -tazobactam (ZOSYN )  IVPB 3.375 g  Status:  Discontinued        3.375 g 12.5 mL/hr over 240 Minutes Intravenous Every 8 hours 12/24/23 0913 12/29/23 1536   12/13/23 1700  piperacillin -tazobactam (ZOSYN ) IVPB 3.375 g        3.375 g 12.5 mL/hr over 240 Minutes Intravenous Every 8 hours 12/13/23 1412 12/13/23 2139   11/30/23 1230  fluconazole  (DIFLUCAN ) IVPB 400 mg  Status:  Discontinued        400 mg 100 mL/hr over 120 Minutes Intravenous Every 24 hours 11/30/23 1135 12/28/23 1321   11/26/23 0945  piperacillin -tazobactam (ZOSYN ) IVPB 3.375 g  Status:  Discontinued        3.375 g 12.5 mL/hr over 240 Minutes Intravenous Every 8 hours 11/26/23 0853 12/13/23 1412   11/23/23 2100  ceFAZolin  (ANCEF ) IVPB 2g/100 mL premix        2 g 200 mL/hr over 30 Minutes Intravenous Every 8 hours 11/23/23 1654 11/23/23 2214   11/23/23 0700  ceFAZolin  (ANCEF ) IVPB 2g/100 mL premix        2 g 200 mL/hr over 30 Minutes Intravenous On call to O.R. 11/23/23 0654 11/23/23 1249       Assessment/Plan: Christine Morse is  an 88 year old female status post Whipple Procedure on 11/23/2023, (POD 42).  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 Rare candida seen on final culture. Completed Zosyn . Fluc stopped yesterday as repeat cx negative for candida. Repeat CT 1/2 with abdominal wall collection now s/p IR aspiration 1/3 and bedside I&D 1/4.  Zosyn  restarted 1/3 - stopped 1/8 and switched to ceftriaxone /flagyl  based on cx of citrobacter and b frag.   Pancreatic leak - some leakage via abd wound. R drain removed 12/31/23.  Continue left sided drain and dressing changes.   Delayed gastric emptying. Qtc ok.  Continue reglan . TPN. Failed clamping trial again 12/27/22. NGT out accidentally 1/9.  G tube planned tomorrow.  HOCM - Continue beta blockade and treat hypertension. IV metoprolol  q4h per  Cardiology.   Anemia of chronic disease, chronic blood loss anemia, and acute blood loss anemia- S/p 1U PRBC 12/27.  Hyperglycemia - improving, monitor   Hypophosphatemia - resolved Hypokalemia - resolved. Pharmacy monitoring.  Severe protein calorie malnutrition - TNA. RUE DVT - treatment dose lovenox .  New picc placed opposite side.   OOB, IS, PT consult.  Thrush - nystatin  suspension  FEN - TPN. Cont PPI BID & Carafate  via tube. SABRA   VTE - lovenox  treatment dose - cont. Right sided picc out.   ID - Fluconazole - stopped 1/7, resumed Zosyn  1/3 >> afebrile with normal WBCs.  Switched to ceftriaxone  and flagyl  1/8.  Dispo: perc g tube tentatively planned tomorrow. Cycle TPN.     LOS: 42 days    01/04/2024, 1:46 PM   Christine Morse Nephew, MD, FACS, FSSO Surgical Oncology, General Surgery, Trauma and Critical [facility], [place] [phone] for weekday/non holidays Check [email or web address] for coverage night/weekend/holidays

## 2024-01-04 NOTE — Progress Notes (Signed)
 OT Note Discussed care plan with COTA. Goals updated. Continue to feel pt will benefit from continued inpatient follow up therapy, <3 hours/day. Acute OT to follow.  Kreg Sink, OT/L   Acute OT Clinical Specialist Acute Rehabilitation Services Pager [phone] Office [phone]

## 2024-01-05 ENCOUNTER — Inpatient Hospital Stay (HOSPITAL_COMMUNITY): Payer: Medicare Other

## 2024-01-05 DIAGNOSIS — Z711 Person with feared health complaint in whom no diagnosis is made: Secondary | ICD-10-CM | POA: Diagnosis not present

## 2024-01-05 DIAGNOSIS — Z515 Encounter for palliative care: Secondary | ICD-10-CM | POA: Diagnosis not present

## 2024-01-05 DIAGNOSIS — Z789 Other specified health status: Secondary | ICD-10-CM | POA: Diagnosis not present

## 2024-01-05 DIAGNOSIS — D132 Benign neoplasm of duodenum: Secondary | ICD-10-CM | POA: Diagnosis not present

## 2024-01-05 DIAGNOSIS — R638 Other symptoms and signs concerning food and fluid intake: Secondary | ICD-10-CM

## 2024-01-05 HISTORY — PX: IR GASTROSTOMY TUBE MOD SED: IMG625

## 2024-01-05 LAB — BASIC METABOLIC PANEL
Anion gap: 5 (ref 5–15)
BUN: 24 mg/dL — ABNORMAL HIGH (ref 8–23)
CO2: 26 mmol/L (ref 22–32)
Calcium: 8.2 mg/dL — ABNORMAL LOW (ref 8.9–10.3)
Chloride: 107 mmol/L (ref 98–111)
Creatinine, Ser: 0.37 mg/dL — ABNORMAL LOW (ref 0.44–1.00)
GFR, Estimated: >60 mL/min (ref >60)
Glucose, Bld: 149 mg/dL — ABNORMAL HIGH (ref 70–99)
Potassium: 4.2 mmol/L (ref 3.5–5.1)
Sodium: 138 mmol/L (ref 135–145)

## 2024-01-05 LAB — CBC
HCT: 31.5 % — ABNORMAL LOW (ref 36.0–46.0)
Hemoglobin: 9.5 g/dL — ABNORMAL LOW (ref 12.0–15.0)
MCH: 28.6 pg (ref 26.0–34.0)
MCHC: 30.2 g/dL (ref 30.0–36.0)
MCV: 94.9 fL (ref 80.0–100.0)
Platelets: 487 10*3/uL — ABNORMAL HIGH (ref 150–400)
RBC: 3.32 MIL/uL — ABNORMAL LOW (ref 3.87–5.11)
RDW: 17.7 % — ABNORMAL HIGH (ref 11.5–15.5)
WBC: 9.9 10*3/uL (ref 4.0–10.5)
nRBC: 0 % (ref 0.0–0.2)

## 2024-01-05 LAB — GLUCOSE, CAPILLARY
Glucose-Capillary: 104 mg/dL — ABNORMAL HIGH (ref 70–99)
Glucose-Capillary: 124 mg/dL — ABNORMAL HIGH (ref 70–99)
Glucose-Capillary: 133 mg/dL — ABNORMAL HIGH (ref 70–99)
Glucose-Capillary: 138 mg/dL — ABNORMAL HIGH (ref 70–99)
Glucose-Capillary: 142 mg/dL — ABNORMAL HIGH (ref 70–99)

## 2024-01-05 MED ORDER — IOHEXOL 300 MG/ML  SOLN
50.0000 mL | Freq: Once | INTRAMUSCULAR | Status: AC | PRN
  Administered 2024-01-05: 15 mL

## 2024-01-05 MED ORDER — INSULIN ASPART 100 UNIT/ML IJ SOLN
0.0000 [IU] | INTRAMUSCULAR | Status: DC
  Administered 2024-01-05 – 2024-01-07 (×3): 1 [IU] via SUBCUTANEOUS
  Administered 2024-01-07: 2 [IU] via SUBCUTANEOUS
  Administered 2024-01-08 (×2): 1 [IU] via SUBCUTANEOUS
  Administered 2024-01-09: 2 [IU] via SUBCUTANEOUS
  Administered 2024-01-09: 1 [IU] via SUBCUTANEOUS
  Administered 2024-01-10: 3 [IU] via SUBCUTANEOUS
  Administered 2024-01-10: 2 [IU] via SUBCUTANEOUS
  Administered 2024-01-11 – 2024-01-14 (×8): 1 [IU] via SUBCUTANEOUS

## 2024-01-05 MED ORDER — FENTANYL CITRATE (PF) 100 MCG/2ML IJ SOLN
INTRAMUSCULAR | Status: AC | PRN
  Administered 2024-01-05 (×2): 25 ug via INTRAVENOUS

## 2024-01-05 MED ORDER — MIDAZOLAM HCL 2 MG/2ML IJ SOLN
INTRAMUSCULAR | Status: AC
Start: 2024-01-05 — End: ?
  Filled 2024-01-05: qty 2

## 2024-01-05 MED ORDER — FENTANYL CITRATE (PF) 100 MCG/2ML IJ SOLN
INTRAMUSCULAR | Status: AC
  Filled 2024-01-05: qty 2

## 2024-01-05 MED ORDER — LIDOCAINE-EPINEPHRINE 1 %-1:100000 IJ SOLN
20.0000 mL | Freq: Once | INTRAMUSCULAR | Status: AC
  Administered 2024-01-05: 20 mL via INTRADERMAL
  Filled 2024-01-05: qty 20

## 2024-01-05 MED ORDER — TRAVASOL 10 % IV SOLN
INTRAVENOUS | Status: AC
Start: 2024-01-05 — End: 2024-01-06
  Filled 2024-01-05: qty 990

## 2024-01-05 MED ORDER — MIDAZOLAM HCL 2 MG/2ML IJ SOLN
INTRAMUSCULAR | Status: AC | PRN
  Administered 2024-01-05: 1 mg via INTRAVENOUS
  Administered 2024-01-05: .5 mg via INTRAVENOUS

## 2024-01-05 MED ORDER — LIDOCAINE-EPINEPHRINE 1 %-1:100000 IJ SOLN
INTRAMUSCULAR | Status: AC
  Filled 2024-01-05: qty 1

## 2024-01-05 MED ORDER — CEFAZOLIN SODIUM-DEXTROSE 2-4 GM/100ML-% IV SOLN
INTRAVENOUS | Status: AC
  Filled 2024-01-05: qty 100

## 2024-01-05 NOTE — Progress Notes (Signed)
 PHARMACY - TOTAL PARENTERAL NUTRITION CONSULT NOTE  Indication:  delayed gastric emptying  Patient Measurements: Height: 4\' 10"  (147.3 cm) Weight: 37.2 kg (82 lb 0.2 oz) IBW/kg (Calculated) : 40.9 TPN AdjBW (KG): 44.5 Body mass index is 17.14 kg/m. 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  Patient was started on a CLD on 12/6-12/9 and then advanced to a soft diet since 12/10.  Patient has had minimal PO intake since being post-op and continues to experience nausea. Delayed gastric emptying likely related to possible pancreatic leak.  Pharmacy consulted to dose TPN.  Glucose / Insulin : A1c 3.7 (11/15/23) - glucose <180, 4 units SSI since cycling  Electrolytes: all WNL Renal: SCr < 1, BUN WNL Hepatic: AST/ALT/tbili WNL, alk phos wnl, albumin  2.3, TG 90 Intake / Output; MIVF: UOP 2x unmeasured, NGT 900 mL, drain 0 mL, LBM 1/13. Net I/O not accurate given unmeasured UOP.  GI Imaging: 12/7 XR: unremarkable bowel gas pattern. 12/11 CT: possible post-op seroma or hematoma vs abscess, distal colonic diverticulosis 12/15 XR: expected post-op changes, NGT in correct place 12/26 CT: perc drain cath adj left lobe liver, no discernable residual fluid collection. Post whipple w/ no evidence pancreatic ductal dilatation or peripancreatic fluid collection, small amount of free fluid in pelvis 1/2 CT - minimal fluid near drain in abdomen  1/11, 1/13 confirmed enteric tube placement  GI Surgeries / Procedures:  12/3 diagnostic laparoscopy, Whipple procedure, pancreatic duct stent 12/13 subhepatic fluid collection aspiration and drainage 1/3 IR US  drain placement of abdominal abscess   Central access: PICC replaced 12/27/23 TPN start date: 12/02/23  Nutritional Goals: RD Estimated Needs Total Energy Estimated Needs: 1700-2000 kcal Total Protein  Estimated Needs: 85-100 gm Total Fluid Estimated Needs: 1 ml/kcal  Current Nutrition:  TPN  Plan:  Cyclic TPN - reduce today to 16 hours from 18 hours. Will only be able to cycle down to 16 hours per day to be under 7 mg/kg/min GIR (at 120 mL/hr, GIR is 6.99).  Will consider concentrated AA to clinisol 15% if patient develops edema with higher infusion rates, continue 10% travasol  for now.  1800 mL TPN rate 60-120 mL/hr to provide 1732 kCal and 99g AA, meeting 100% of nutritional needs.  Electrolytes in TPN: Na 42mEq/L, K 34mEq/L, Ca 60mEq/L, Mg 50mEq/L, Phos 15mmol/L, change to Cl:Ac 1:1 on 1/14.  Monitor TPN labs on Mon/Thurs Continue sensitive sliding scale and CBGs 1 hr after TPN starts, one at peak, one at 1 hr after TPN stops and 1 while off  IR consulted for percutaneous gastric/gastrojejunal tube placement. Planned for 1/15.   Christine Morse, PharmD, BCPS, BCCCP Clinical Pharmacist

## 2024-01-05 NOTE — Progress Notes (Signed)
 Pt chair alarm going off, pt attempting to get up by herself, NGT found pulled out and placed on the bedside table, Pt helped to bed with assistance of another RN  On call MD paged [employer]) verbal orders to leave out for now since she has G-tube placement planned later today

## 2024-01-05 NOTE — Progress Notes (Addendum)
 43 Days Post-Op   Subjective/Chief Complaint: G tube successfully placed.  Pt denies any significant pain.    Objective: Vital signs in last 24 hours: Temp:  [97 F (36.1 C)-98.6 F (37 C)] 97.7 F (36.5 C) (01/15 1500) Pulse Rate:  [91-105] 105 (01/15 0920) Resp:  [17-24] 18 (01/15 0920) BP: (125-176)/(66-84) 148/84 (01/15 1500) SpO2:  [96 %-99 %] 97 % (01/15 1500) Weight:  [37.2 kg] 37.2 kg (01/15 0500) Last BM Date : 01/03/24  Intake/Output from previous day: 01/14 0701 - 01/15 0700 In: 797.4 [I.V.:797.4] Out: 900 [Emesis/NG output:900] Intake/Output this shift: No intake/output data recorded.  Physical Exam:  General appearance: OOB in chair.  Voice much stronger Resp: breathing comfortably GI: soft, non distended. No rigidity or guarding. Midline wound with panc drainage. G tube in place with bilious drainage.              D1 - LLQ - purulent appearing Extremities: tr LE edema  Lab Results:  Recent Labs    01/04/24 1122 01/05/24 0822  WBC 13.3* 9.9  HGB 11.0* 9.5*  HCT 35.1* 31.5*  PLT 677* 487*    BMET Recent Labs    01/04/24 0507 01/05/24 0618  NA 139 138  K 3.9 4.2  CL 107 107  CO2 27 26  GLUCOSE 139* 149*  BUN 15 24*  CREATININE 0.37* 0.37*  CALCIUM  8.1* 8.2*   PT/INR Recent Labs    01/03/24 0651 01/04/24 1122  LABPROT 14.0 13.7  INR 1.1 1.0     ABG No results for input(s): "PHART", "HCO3" in the last 72 hours.  Invalid input(s): "PCO2", "PO2"   Studies/Results:  Anti-infectives: Anti-infectives (From admission, onward)    Start     Dose/Rate Route Frequency Ordered Stop   12/29/23 1630  cefTRIAXone  (ROCEPHIN ) 2 g in sodium chloride  0.9 % 100 mL IVPB       Note to Pharmacy: Pharmacy may adjust dosing strength / duration / interval for maximal efficacy   2 g 200 mL/hr over 30 Minutes Intravenous Every 24 hours 12/29/23 1536     12/29/23 1630  metroNIDAZOLE  (FLAGYL ) IVPB 500 mg        500 mg 100 mL/hr over 60 Minutes  Intravenous Every 12 hours 12/29/23 1536     12/24/23 1000  piperacillin -tazobactam (ZOSYN ) IVPB 3.375 g  Status:  Discontinued        3.375 g 12.5 mL/hr over 240 Minutes Intravenous Every 8 hours 12/24/23 0913 12/29/23 1536   12/13/23 1700  piperacillin -tazobactam (ZOSYN ) IVPB 3.375 g        3.375 g 12.5 mL/hr over 240 Minutes Intravenous Every 8 hours 12/13/23 1412 12/13/23 2139   11/30/23 1230  fluconazole  (DIFLUCAN ) IVPB 400 mg  Status:  Discontinued        400 mg 100 mL/hr over 120 Minutes Intravenous Every 24 hours 11/30/23 1135 12/28/23 1321   11/26/23 0945  piperacillin -tazobactam (ZOSYN ) IVPB 3.375 g  Status:  Discontinued        3.375 g 12.5 mL/hr over 240 Minutes Intravenous Every 8 hours 11/26/23 0853 12/13/23 1412   11/23/23 2100  ceFAZolin  (ANCEF ) IVPB 2g/100 mL premix        2 g 200 mL/hr over 30 Minutes Intravenous Every 8 hours 11/23/23 1654 11/23/23 2214   11/23/23 0700  ceFAZolin  (ANCEF ) IVPB 2g/100 mL premix        2 g 200 mL/hr over 30 Minutes Intravenous On call to O.R. 11/23/23 0654 11/23/23 1249  Assessment/Plan: Ms. Follman is an 88 year old female status post Whipple Procedure on 11/23/2023, (POD 43).  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 Rare candida seen on final culture. Completed Zosyn . Fluc stopped yesterday as repeat cx negative for candida. Repeat CT 1/2 with abdominal wall collection now s/p IR aspiration 1/3 and bedside I&D 1/4.  Zosyn  restarted 1/3 - stopped 1/8 and switched to ceftriaxone /flagyl  based on cx of citrobacter and b frag.   Pancreatic leak - some leakage via abd wound. R drain removed 12/31/23.  Continue left sided drain and dressing changes.   Delayed gastric emptying. Qtc ok.  Continue reglan . TPN. Failed clamping trial again 12/27/22. G tube placed.  Keep to gravity now.  Try  sips of clears.  Will try clamping trials in a few days.  Do not anticipate starting any significant tube feeds via the g tube until we see if delayed gastric emptying is resolved.   HOCM - Continue beta blockade and treat hypertension. IV metoprolol  q4h per Cardiology.   Anemia of chronic disease, chronic blood loss anemia, and acute blood loss anemia- S/p 1U PRBC 12/27.  Hyperglycemia - improving, monitor   Hypophosphatemia - resolved Hypokalemia - resolved. Pharmacy monitoring.  Severe protein calorie malnutrition - TNA.  RUE DVT - treatment dose lovenox .  New picc placed opposite side.   OOB, IS, PT consult.  Thrush - nystatin  suspension  FEN - TPN. Cont PPI BID & Carafate  via tube. Aaron Aas   VTE - lovenox  treatment dose - cont. Right sided picc out.   ID - Fluconazole - stopped 1/7, resumed Zosyn  1/3 >> afebrile with normal WBCs.  Switched to ceftriaxone  and flagyl  1/8.  Dispo: currently stable to go out with g tube to gravity and TNA for nutrition if SNF available that takes TNA.  Also would need PT, drain care, abdominal dressing changes at least 2-3 times per day.     LOS: 43 days    01/05/2024, 5:12 PM   Deliliah Fender, MD, FACS, FSSO Surgical Oncology, General Surgery, Trauma and Critical [facility], [place] [phone] for weekday/non holidays Check [email or web address] for coverage night/weekend/holidays

## 2024-01-05 NOTE — Procedures (Signed)
 Interventional Radiology Procedure Note  Procedure: Placement of percutaneous 50F balloon-retention gastrostomy tube.  Complications: None  EBL:  < 5 mL  Recommendations: - NPO for 4 hours after placement - Maintain G-tube to low wall suction for 4 hours after placement - May advance diet as tolerated and begin using tube 4 hours after placement - Gastropexy sutures are absorbable and do not require removal  Marliss Coots, MD Pager: [phone]

## 2024-01-05 NOTE — Progress Notes (Signed)
 Daily Progress Note   Patient Name: Christine Morse       Date: 01/05/2024 DOB: 1936/02/17  Age: 88 y.o. MRN#: [id number] Attending Physician: Lockie Rima, MD Primary Care Physician: Aleta Anda, MD Admit Date: 11/23/2023  Reason for Consultation/Follow-up: Establishing goals of care  Subjective: I have reviewed medical records including EPIC notes, MAR, any available advanced directives as necessary, and labs. Received report from primary RN - no acute concerns. RN reports patient has recently returned from IR - PEG placed today.  Went to visit patient at bedside - no family/visitors present. No advanced directives found in room. Patient was lying in bed asleep - I did not attempt to wake her to preserve comfort. No signs or non-verbal gestures of pain or discomfort noted. No respiratory distress, increased work of breathing, or secretions noted. She remains ill and frail appearing.  Requested RN notify PMT if/when family arrive and/or obtain copies of advanced directives as able.  Length of Stay: 10  Current Medications: Scheduled Meds:   Chlorhexidine  Gluconate Cloth  6 each Topical Q0600   enoxaparin  (LOVENOX ) injection  50 mg Subcutaneous Q12H   insulin  aspart  0-9 Units Subcutaneous 4 times per day   insulin  aspart  0-9 Units Subcutaneous 4 times per day   metoCLOPramide  (REGLAN ) injection  5 mg Intravenous Q8H   metoprolol  tartrate  7.5 mg Intravenous 6 X Daily   nystatin   5 mL Oral QID   pantoprazole  (PROTONIX ) IV  40 mg Intravenous Q12H   sodium chloride  flush  10-40 mL Intracatheter Q12H   sodium chloride  flush  10-40 mL Intracatheter Q12H   sodium chloride  flush  5 mL Intracatheter Q8H   sucralfate   1 g Per Tube TID    Continuous Infusions:  cefTRIAXone  (ROCEPHIN )   IV 2 g (01/04/24 1549)   metronidazole  500 mg (01/05/24 0533)   TPN CYCLIC-ADULT (ION) 106 mL/hr at 01/04/24 1917   TPN CYCLIC-ADULT (ION)      PRN Meds: acetaminophen , fentaNYL  (SUBLIMAZE ) injection, guaiFENesin -dextromethorphan , haloperidol  lactate, ipratropium-albuterol , methocarbamol  (ROBAXIN ) injection, ondansetron  (ZOFRAN ) IV, mouth rinse, prochlorperazine  **OR** prochlorperazine , sodium chloride  flush, sodium chloride  flush  Physical Exam Vitals and nursing note reviewed.  Constitutional:      General: She is not in acute distress.    Appearance: She is ill-appearing.  Pulmonary:  Effort: No respiratory distress.  Skin:    General: Skin is warm and dry.  Neurological:     Motor: Weakness present.     Comments: Asleep             Vital Signs: BP 139/69   Pulse (!) 105   Temp 98.4 F (36.9 C) (Axillary)   Resp 18   Ht 4\' 10"  (1.473 m)   Wt 37.2 kg   SpO2 96%   BMI 17.14 kg/m  SpO2: SpO2: 96 % O2 Device: O2 Device: Room Air O2 Flow Rate: O2 Flow Rate (L/min): 2 L/min  Intake/output summary:  Intake/Output Summary (Last 24 hours) at 01/05/2024 0959 Last data filed at 01/05/2024 0037 Gross per 24 hour  Intake 797.35 ml  Output 900 ml  Net -102.65 ml   LBM: Last BM Date : 01/03/24 Baseline Weight: Weight: 55.3 kg Most recent weight: Weight: 37.2 kg       Palliative Assessment/Data: PPS 20% now with PEG (no feedings have been started)      Patient Active Problem List   Diagnosis Date Noted   Protein-calorie malnutrition, severe 12/01/2023   Duodenal adenoma 11/23/2023   Upper GI bleed 11/08/2023   Hyponatremia 11/07/2023   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   Positive fecal occult blood test 10/26/2023   Duodenal mass 10/25/2023   Symptomatic anemia 10/24/2023   Nonrheumatic aortic valve stenosis 07/30/2022   Abnormal stress test 07/30/2022   Pyelonephritis 09/19/2017   Essential hypertension 09/19/2017   Hyperlipidemia 09/19/2017    Pulmonary vascular congestion 09/19/2017   Cameron ulcer 10/05/2013   Iron  deficiency anemia due to chronic blood loss 10/04/2013   Other specified gastritis without mention of hemorrhage 10/04/2013   Chest tightness 10/03/2013   Vitamin D deficiency 04/08/2009   GERD 03/26/2009   Diverticulosis of colon 03/26/2009   DIARRHEA 03/26/2009   NEPHROLITHIASIS, HX OF 03/26/2009    Palliative Care Assessment & Plan   Patient Profile: 88 y.o. female  with past medical history of chronic blood loss anemia, aortic stenosis, HTN, HLD, GERD, diverticular disease, and recent diagnosis of duodenal mass suspected to be invasive cancer with plans for a Whipple procedure admitted on 11/23/2023 with shortness of breath with little exertion and lower extremity edema.    Patient had Whipple Procedure 11/23/2023 (POD 40). Final pathology - adenocarcinoma duodenum, positive margin, positive LN - pT3pN2cM0. Since then she has had delayed gastric emptying and a pancreatic leak. She currently has a NG tube in place. She is on TPN.  Assessment: Principal Problem:   Duodenal adenoma Active Problems:   Protein-calorie malnutrition, severe   Concern about end of life  Recommendations/Plan: Continue full scope care Continue DNR/DNI as previously documented - durable DNR form completed and placed in shadow chart. Copy was made and will be scanned into Vynca/ACP tab Family not at bedside with AD's this morning. RN will notify PMT if/when family provides documents PMT will continue to follow and support holistically  Goals of Care and Additional Recommendations: Limitations on Scope of Treatment: Full Scope Treatment  Code Status:    Code Status Orders  (From admission, onward)           Start     Ordered   01/04/24 1259  Do not attempt resuscitation (DNR)- Limited -Do Not Intubate (DNI)  (Code Status)  Continuous       Question Answer Comment  If pulseless and not breathing No CPR or chest  compressions.   In Pre-Arrest Conditions (Patient  Is Breathing and Has A Pulse) Do not intubate. Provide all appropriate non-invasive medical interventions. Avoid ICU transfer unless indicated or required.   Consent: Discussion documented in EHR or advanced directives reviewed      01/04/24 1259           Code Status History     Date Active Date Inactive Code Status Order ID Comments User Context   11/23/2023 1654 01/04/2024 1259 Full Code [id number]  Lockie Rima, MD Inpatient   11/07/2023 2346 11/08/2023 1709 Full Code [id number]  Davida Espy, MD ED   10/24/2023 0232 10/27/2023 1959 Full Code [id number]  Bary Boss, DO ED   09/19/2017 0332 09/20/2017 1616 Full Code [id number]  Danice Dural, MD ED   10/04/2013 0102 10/05/2013 1249 Full Code [id number]  Dea Evert, DO Inpatient      Advance Directive Documentation    Flowsheet Row Most Recent Value  Type of Advance Directive Healthcare Power of Attorney, Living will  Pre-existing out of facility DNR order (yellow form or pink MOST form) --  "MOST" Form in Place? --       Prognosis:  Unable to determine  Discharge Planning: To Be Determined  Care plan was discussed with primary RN  Thank you for allowing the Palliative Medicine Team to assist in the care of this patient.   Total Time 25 minutes Prolonged Time Billed  no       Annette Killings, NP  Please contact Palliative Medicine Team phone at [phone] for questions and concerns.   *Portions of this note are a verbal dictation therefore any spelling and/or grammatical errors are due to the "[employer]agon [employer] One" system interpretation.

## 2024-01-05 NOTE — Progress Notes (Signed)
 Physical Therapy Treatment Patient Details Name: Christine Morse MRN: [id number] DOB: Sep 12, 1936 Today's Date: 01/05/2024   History of Present Illness Patient is an 88 y/o female admitted 11/23/23 with diagnosis of duodenal adenoma and underwent laparoscopic Whipple procedure. post-op subcapsular fluid collection around the anterior aspect of the left liver lobe. Drain placement in IR 12/03/23. NGT inserted 12/15, dislodged then replaced.  Positive for R subclavian and axillary DVT on 12/07/23. 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    PT Comments  Patient declined getting out out of bed today despite encouragement. Assistance required for rolling to the right and repositioning. Patient performed therapeutic exercises for LE strengthening in bed with cues for technique. Continue to recommend rehabilitation < 3 hours/day after this hospital stay.    If plan is discharge home, recommend the following: A little help with walking and/or transfers;A little help with bathing/dressing/bathroom;Assistance with cooking/housework;Assist for transportation   Can travel by private vehicle     Yes  Equipment Recommendations  Rolling walker (2 wheels);BSC/3in1    Recommendations for Other Services       Precautions / Restrictions Precautions Precautions: Fall Restrictions Weight Bearing Restrictions Per Provider Order: No     Mobility  Bed Mobility Overal bed mobility: Needs Assistance Bed Mobility: Rolling Rolling: Min assist (Min cues for technique)         General bed mobility comments: facilitation for reaching across midline for rolling to the right. patient refused to sit up or further mobility (stating she just does not feel like it today) despite encouragement. encouraged patient to assist with routine mobility. Mod Af required for scooting up in the bed with encouragement for participation    Transfers                         Ambulation/Gait                   Stairs             Wheelchair Mobility     Tilt Bed    Modified Rankin (Stroke Patients Only)       Balance                                            Cognition Arousal: Alert Behavior During Therapy: Flat affect Overall Cognitive Status: No family/caregiver present to determine baseline cognitive functioning Area of Impairment: Safety/judgement, Problem solving                         Safety/Judgement: Decreased awareness of safety, Decreased awareness of deficits   Problem Solving: Slow processing, Decreased initiation, Requires verbal cues          Exercises General Exercises - Lower Extremity Heel Slides: AROM, Strengthening, Both, 10 reps, Supine Hip ABduction/ADduction: AROM, Strengthening, Both, 10 reps, Supine Other Exercises Other Exercises: cues for exercise technique for strengthening. encouraged AROM as able    General Comments General comments (skin integrity, edema, etc.): vitals stable throughout session      Pertinent Vitals/Pain Pain Assessment Pain Assessment: Faces Faces Pain Scale: Hurts a little bit Pain Location: LLQ Pain Descriptors / Indicators: Grimacing Pain Intervention(s): Monitored during session, Limited activity within patient's tolerance, Repositioned    Home Living  Prior Function            PT Goals (current goals can now be found in the care plan section) Acute Rehab PT Goals Patient Stated Goal: return to independent PT Goal Formulation: With patient Time For Goal Achievement: 01/17/24 Potential to Achieve Goals: Fair Progress towards PT goals: Progressing toward goals    Frequency    Min 1X/week      PT Plan      Co-evaluation              AM-PAC PT "6 Clicks" Mobility   Outcome Measure  Help needed turning from your back to your side while in a flat bed without using bedrails?: A  Little Help needed moving from lying on your back to sitting on the side of a flat bed without using bedrails?: A Little Help needed moving to and from a bed to a chair (including a wheelchair)?: A Little Help needed standing up from a chair using your arms (e.g., wheelchair or bedside chair)?: A Little Help needed to walk in hospital room?: A Little Help needed climbing 3-5 steps with a railing? : Total 6 Click Score: 16    End of Session   Activity Tolerance: Patient limited by fatigue Patient left: in bed;with call bell/phone within reach;with bed alarm set Nurse Communication: Mobility status PT Visit Diagnosis: Other abnormalities of gait and mobility (R26.89);Muscle weakness (generalized) (M62.81)     Time: 1610-9604 PT Time Calculation (min) (ACUTE ONLY): 10 min  Charges:    $Therapeutic Activity: 8-22 mins PT General Charges $$ ACUTE PT VISIT: 1 Visit                     [name], PT, MPT   [name] 01/05/2024, 2:17 PM

## 2024-01-06 ENCOUNTER — Ambulatory Visit: Payer: Medicare Other | Admitting: Physician Assistant

## 2024-01-06 LAB — PHOSPHORUS: Phosphorus: 3.1 mg/dL (ref 2.5–4.6)

## 2024-01-06 LAB — COMPREHENSIVE METABOLIC PANEL
ALT: 31 U/L (ref 0–44)
AST: 27 U/L (ref 15–41)
Albumin: 2.4 g/dL — ABNORMAL LOW (ref 3.5–5.0)
Alkaline Phosphatase: 93 U/L (ref 38–126)
Anion gap: 7 (ref 5–15)
BUN: 15 mg/dL (ref 8–23)
CO2: 25 mmol/L (ref 22–32)
Calcium: 7.9 mg/dL — ABNORMAL LOW (ref 8.9–10.3)
Chloride: 101 mmol/L (ref 98–111)
Creatinine, Ser: 0.3 mg/dL — ABNORMAL LOW (ref 0.44–1.00)
GFR, Estimated: >60 mL/min (ref >60)
Glucose, Bld: 141 mg/dL — ABNORMAL HIGH (ref 70–99)
Potassium: 3.3 mmol/L — ABNORMAL LOW (ref 3.5–5.1)
Sodium: 133 mmol/L — ABNORMAL LOW (ref 135–145)
Total Bilirubin: 0.5 mg/dL (ref 0.0–1.2)
Total Protein: 5.5 g/dL — ABNORMAL LOW (ref 6.5–8.1)

## 2024-01-06 LAB — MAGNESIUM: Magnesium: 1.8 mg/dL (ref 1.7–2.4)

## 2024-01-06 LAB — GLUCOSE, CAPILLARY
Glucose-Capillary: 120 mg/dL — ABNORMAL HIGH (ref 70–99)
Glucose-Capillary: 147 mg/dL — ABNORMAL HIGH (ref 70–99)
Glucose-Capillary: 106 mg/dL — ABNORMAL HIGH (ref 70–99)
Glucose-Capillary: 149 mg/dL — ABNORMAL HIGH (ref 70–99)

## 2024-01-06 MED ORDER — POTASSIUM CHLORIDE 10 MEQ/50ML IV SOLN
10.0000 meq | INTRAVENOUS | Status: AC
  Administered 2024-01-06 (×6): 10 meq via INTRAVENOUS
  Filled 2024-01-06 (×6): qty 50

## 2024-01-06 MED ORDER — DEXTROSE 10 % IV SOLN: INTRAVENOUS | Status: DC

## 2024-01-06 MED ORDER — MAGNESIUM SULFATE 2 GM/50ML IV SOLN
2.0000 g | Freq: Once | INTRAVENOUS | Status: AC
  Administered 2024-01-06: 2 g via INTRAVENOUS
  Filled 2024-01-06: qty 50

## 2024-01-06 MED ORDER — TRAVASOL 10 % IV SOLN
INTRAVENOUS | Status: DC
  Filled 2024-01-06: qty 990

## 2024-01-06 MED ORDER — TRAVASOL 10 % IV SOLN
INTRAVENOUS | Status: AC
  Filled 2024-01-06: qty 990

## 2024-01-06 NOTE — Progress Notes (Signed)
Nutrition Follow-up  DOCUMENTATION CODES:   Severe malnutrition in context of acute illness/injury  INTERVENTION:   - Diet advancement per MD  - TPN per pharmacy (Pharmacy to add MVI to TPN)  NUTRITION DIAGNOSIS:   Severe Malnutrition (in the context of acute illness) related to  (inadequate energy intake) as evidenced by energy intake < or equal to 50% for > or equal to 5 days, moderate fat depletion, moderate muscle depletion.  - Still applicable   GOAL:   Patient will meet greater than or equal to 90% of their needs  - Meeting via TPN   MONITOR:   PO intake, Supplement acceptance, Labs, Weight trends  REASON FOR ASSESSMENT:   Consult Assessment of nutrition requirement/status  ASSESSMENT:   Pt with hx of HTN, HLD, GERD, and diverticulosis presented for planned surgery after a large duodenal mass was seen on imaging at admission in November and was presumed to be malignant. Underwent pancreaticoduodenectomy and placement of pancreatic duct stent and confirmed tohave adenocarcinoma duodenum.  12/3 - Op, whipple procedure and placement of pancreatic duct stent.  12/10 - Soft diet  12/11 CT: possible post-op seroma or hematoma vs abscess, distal colonic diverticulosis  12/12 - TPN started,  12/13 - CT aspiration and drain placement, NPO 12/15 - NG inserted for persistent emesis  12/18- venous duplex positive for VTE; started on treatment dose lovenox 12/20- NGT clamping trials 12/21- NGT d/c, advanced to clear liquids 12/22- advanced to full liquids 12/23 - clear liquids 12/24 - NPO 12/25 NGT replaced 1/5-1/6 - Pt pulled PICC line, TPN stopped, replaced 1/6 1/10 - Pt pulled NGT, Right drain removed  1/13 - NGT replaced  1/15 - IR placement of PEG tube    Patient had PEG tube placed yesterday and is being used for drainage via gravity. Per MD, they do not anticipate starting tube feeds via  G-tube anytime soon until delayed gastric emptying is resolved, they will  try clamping trials in a few days. TPN to continue. Of note this morning TPN tube chamber cracked and pharmacy ordered D10 at 120 ml/hr. TPN stopped for now. Messaged Pharmacy and plan is to replace damaged tube chamber and restart TPN at 1800.   Ongoing delayed gastric emptying and small pancreatic leak. R drain removed 1/10. Left sided drain still in place. MD allowing sips of clears.  Last BM was 1/13. GOC continuing, palliative following.   She has been losing weight since admit but her weight has been stabilizing since increasing her energy needs on 1/3. New bed weight yesterday revealed a 26 lbs weight loss, however question if this is accurate as weight two days ago was following trends. Messaged RN to provide new weight. Bed scale may have to be zeroed out.   Admit weight: 55.3 kg  Current weight: 37.2 kg     Intake/Output Summary (Last 24 hours) at 01/06/2024 0837 Last data filed at 01/06/2024 4098 Gross per 24 hour  Intake 1538.76 ml  Output 400 ml  Net 1138.76 ml   Drains/Lines: Closed system drain 1 L LLQ: 70 ml x 24 hours PEG: 400 ml  Average Meal Intake: NPO  Nutritionally Relevant Medications: Scheduled Meds:  insulin aspart  0-9 Units Subcutaneous 4 times per day   metoCLOPramide (REGLAN) injection  5 mg Intravenous Q8H   metoprolol tartrate  7.5 mg Intravenous 6 X Daily   Continuous Infusions:  cefTRIAXone (ROCEPHIN)  IV 2 g (01/05/24 1704)   dextrose 120 mL/hr at 01/06/24 0030  magnesium sulfate bolus IVPB 2 g (01/06/24 0751)   metronidazole 500 mg (01/06/24 0520)   potassium chloride     TPN CYCLIC-ADULT (ION) Stopped (21/30/86 0000)   TPN CYCLIC-ADULT (ION)     Labs Reviewed: Sodium 133, Potassium 3.3, Creatinine 0.3, Calcium 7.9  CBG ranges from 104-147 mg/dL over the last 24 hours HgbA1c 3.7 (10/2023)  Diet Order:   Diet Order     None       EDUCATION NEEDS:   Education needs have been addressed  Skin:  Skin Assessment: Skin Integrity  Issues: Skin Integrity Issues:: Incisions Incisions: Abdomen  Last BM:  1/13, type 7 x2  Height:   Ht Readings from Last 1 Encounters:  11/23/23 4\' 10"  (1.473 m)    Weight:   Wt Readings from Last 1 Encounters:  01/05/24 37.2 kg    Ideal Body Weight:  43.9 kg  BMI:  Body mass index is 17.14 kg/m.  Estimated Nutritional Needs:   Kcal:  1700-2000 kcal  Protein:  85-100 gm  Fluid:  1 ml/kcal   Elliot Dally, RD Registered Dietitian  See Amion for more information

## 2024-01-06 NOTE — TOC Progression Note (Addendum)
Transition of Care [facility]) - Progression Note    Patient Details  Name: Christine Morse MRN: [id number] Date of Birth: 1936/10/28  Transition of [facility]) CM/SW Contact  Eduard Roux, [place] Phone Number: 01/06/2024, 10:45 AM  Clinical Narrative:     Patient may benefits from [facility] referral -patient will need TOC consult for [facility] if MD agrees [facility] could be beneficial.   TOC continues to follow and will assist with discharge planning once stable for d/c.  Antony Blackbird, MSW, LCSW Clinical Social Worker     Barriers to Discharge: Continued Medical Work up  Expected Discharge Plan and Services In-house Referral: Clinical Social Work   Post Acute Care Choice: Skilled Nursing Facility Living arrangements for the past 2 months: Single Family Home                                       Social Determinants of Health (SDOH) Interventions SDOH Screenings   Food Insecurity: No Food Insecurity (11/23/2023)  Housing: Patient Declined (11/23/2023)  Transportation Needs: No Transportation Needs (11/23/2023)  Utilities: Not At Risk (11/23/2023)  Social Connections: Unknown (12/21/2023)  Tobacco Use: Low Risk  (11/23/2023)  Recent Concern: Tobacco Use - Medium Risk (11/07/2023)    Readmission Risk Interventions    10/27/2023    9:03 AM  Readmission Risk Prevention Plan  Transportation Screening Complete  PCP or Specialist Appt within 5-7 Days Complete  Home Care Screening Complete  Medication Review (RN CM) Complete

## 2024-01-06 NOTE — Progress Notes (Signed)
Christine Morse is a 88 y.o. female s/p image guided gastrostomy tube placement with IR yesterday.   IR was requested for GJ but it was not successful given altered anatomy from Whipple.   G tube site difficult to exam due to patient sitting in a recliner. It appears clean, dry, no bleeding, no sign of acute infection. Tube is being used for ventingw/o difficulty.   Has T Fastener which is designed to fall off spontaneously. IR APP will re-check and remove if T Fastener remains.   Please call IR for questions and concerns regarding the G tube.    [name] [name] PA-C 01/06/2024 3:35 PM

## 2024-01-06 NOTE — Progress Notes (Signed)
Pt TPN tubing chamber cracked after giving patient a bath, IV Team consulted to assess and take down, pharmacy contacted and placed an order for D10 at the same rate (129ml/hr) d/t TPN not being replaced

## 2024-01-06 NOTE — Progress Notes (Signed)
Brief Palliative Medicine Progress Note:  PMT following peripherally for needs/decline:  Medical records reviewed including progress notes, labs, imaging. Gtube successfully placed 1/15. Per surgery, do not anticipate starting any significant tube feeds via gtube until it is known if delayed gastric emptying is resolved, will continue TNA. Disposition is likely LTAC.   Goals are clear for full scope and DNR/DNI. PMT will follow peripherally and visit with patient and family incrementally for goals of care discussions as appropriate and based on clinical course - she remains high risk for decline. If there are any imminent needs please call the service directly. Family also has PMT contact information should further needs arise.  Thank you for allowing PMT to assist in the care of this patient.  Malikah Lakey M. Katrinka Blazing [facility] Palliative Medicine Team Team Phone: [phone] NO CHARGE

## 2024-01-06 NOTE — Progress Notes (Signed)
Occupational Therapy Treatment Patient Details Name: Christine Morse MRN: [id number] DOB: 07/23/1936 Today's Date: 01/06/2024   History of present illness Patient is an 88 y/o female admitted 11/23/23 with diagnosis of duodenal adenoma and underwent laparoscopic Whipple procedure. post-op subcapsular fluid collection around the anterior aspect of the left liver lobe. Drain placement in IR 12/03/23. NGT inserted 12/15, dislodged then replaced.  Positive for R subclavian and axillary DVT on 12/07/23. 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   OT comments  Patient demonstrating limited progress this treatment session. Patient received in supine and agreeable to OOB with OT. Patient required increased assistance with bed mobility with transfers to recliner. Patient declined going to bathroom for grooming and self care and performed face and hand hygiene seated in recliner. Patient will benefit from continued inpatient follow up therapy, <3 hours/day with acute OT to continue to follow to address bathing, dressing, and adaptive equipment training.       If plan is discharge home, recommend the following:  A lot of help with bathing/dressing/bathroom;A little help with walking and/or transfers;Assist for transportation;Assistance with cooking/housework;Help with stairs or ramp for entrance   Equipment Recommendations  None recommended by OT    Recommendations for Other Services      Precautions / Restrictions Precautions Precautions: Fall Precaution Comments: LLQ Blake Drain Restrictions [employer] Bearing Restrictions Per Provider Order: No       Mobility Bed Mobility Overal bed mobility: Needs Assistance Bed Mobility: Rolling, Sidelying to Sit Rolling: Mod assist Sidelying to sit: Mod assist       General bed mobility comments: increased time and education on rail use to assist    Transfers Overall transfer level: Needs assistance Equipment used:  Rolling walker (2 wheels) Transfers: Sit to/from Stand, Bed to chair/wheelchair/BSC Sit to Stand: Mod assist, From elevated surface     Step pivot transfers: Min assist     General transfer comment: patient required mod assist to power up from EOB and min assist for balance during transfer to recliner     Balance Overall balance assessment: Needs assistance Sitting-balance support: Feet supported Sitting balance-[name]: Fair Sitting balance - Comments: maintained sitting balance on EOB but demonstrated forward flexion   Standing balance support: Bilateral upper extremity supported, Reliant on assistive device for balance Standing balance-[name]: Poor Standing balance comment: reliant on UE support                           ADL either performed or assessed with clinical judgement   ADL Overall ADL's : Needs assistance/impaired     Grooming: Wash/dry hands;Wash/dry face;Set up;Sitting Grooming Details (indicate cue type and reason): declined going to bathroom and oral care             Lower Body Dressing: Maximal assistance;Sitting/lateral leans Lower Body Dressing Details (indicate cue type and reason): to donn socks               General ADL Comments: patient keeping eyes closed most of session    Extremity/Trunk Assessment              Vision       Perception     Praxis      Cognition Arousal: Lethargic Behavior During Therapy: Flat affect Overall Cognitive Status: No family/caregiver present to determine baseline cognitive functioning Area of Impairment: Safety/judgement, Problem solving  Memory: Decreased short-term memory   Safety/Judgement: Decreased awareness of safety, Decreased awareness of deficits   Problem Solving: Slow processing, Decreased initiation, Requires verbal cues General Comments: appeared lethargic but agreeable to OOB with therapy, limited verbalization        Exercises       Shoulder Instructions       General Comments BP 129/66 (84) HR 83    Pertinent Vitals/ Pain       Pain Assessment Pain Assessment: Faces Pain Location: LLQ Pain Descriptors / Indicators: Grimacing Pain Intervention(s): Limited activity within patient's tolerance, Monitored during session, Repositioned  Home Living                                          Prior Functioning/Environment              Frequency  Min 1X/week        Progress Toward Goals  OT Goals(current goals can now be found in the care plan section)  Progress towards OT goals: Progressing toward goals  Acute Rehab OT Goals Patient Stated Goal: none stated OT Goal Formulation: With patient Time For Goal Achievement: 01/17/24 Potential to Achieve Goals: Good ADL Goals Pt Will Perform Grooming: with supervision;standing Pt Will Perform Upper Body Bathing: with set-up;sitting Pt Will Perform Lower Body Bathing: sit to/from stand;with contact guard assist Pt Will Perform Lower Body Dressing: with contact guard assist;sit to/from stand Pt Will Transfer to Toilet: with supervision;ambulating;regular height toilet Pt/caregiver will Perform Home Exercise Program: Increased strength;Both right and left upper extremity;With minimal assist;With written HEP provided;With theraband Additional ADL Goal #1: Pt will verbalize 3 strategies to reduce risk of falls with min A  Plan      Co-evaluation                 AM-PAC OT "6 Clicks" Daily Activity     Outcome Measure   Help from another person eating meals?: A Little Help from another person taking care of personal grooming?: A Little Help from another person toileting, which includes using toliet, bedpan, or urinal?: A Lot Help from another person bathing (including washing, rinsing, drying)?: A Lot Help from another person to put on and taking off regular upper body clothing?: A Little Help from another person to put on and  taking off regular lower body clothing?: A Lot 6 Click Score: 15    End of Session Equipment Utilized During Treatment: Rolling walker (2 wheels)  OT Visit Diagnosis: Unsteadiness on feet (R26.81);Muscle weakness (generalized) (M62.81)   Activity Tolerance Patient limited by lethargy   Patient Left in chair;with call bell/phone within reach;with chair alarm set   Nurse Communication Mobility status        Time: 1610-9604 OT Time Calculation (min): 20 min  Charges: OT General Charges $OT Visit: 1 Visit OT Treatments $Self Care/Home Management : 8-22 mins  Alfonse Flavors, OTA Acute Rehabilitation Services  Office [phone]   Dewain Penning 01/06/2024, 9:11 AM

## 2024-01-06 NOTE — Progress Notes (Signed)
44 Days Post-Op   Subjective/Chief Complaint: Patient has no complaints this AM and actually said "good" when asked how she was doing.   Objective: Vital signs in last 24 hours: Temp:  [97.9 F (36.6 C)-98.2 F (36.8 C)] 98.2 F (36.8 C) (01/16 1457) Pulse Rate:  [88-95] 88 (01/16 0255) Resp:  [18-20] 20 (01/16 0255) BP: (122-151)/(62-71) 138/71 (01/16 1457) SpO2:  [96 %-97 %] 97 % (01/16 0255) Last BM Date : 01/03/24  Intake/Output from previous day: 01/15 0701 - 01/16 0700 In: 1538.8 [I.V.:969.5; IV Piggyback:569.3] Out: 400 [Drains:400] Intake/Output this shift: No intake/output data recorded.  Physical Exam:  General appearance: OOB in chair.  Voice much stronger Resp: breathing comfortably GI: soft, non distended. No rigidity or guarding. Midline wound with panc drainage. G tube in place with bilious drainage.              D1 - LLQ - purulent appearing, but scant amount Extremities: tr LE edema  Lab Results:  Recent Labs    01/04/24 1122 01/05/24 0822  WBC 13.3* 9.9  HGB 11.0* 9.5*  HCT 35.1* 31.5*  PLT 677* 487*    BMET Recent Labs    01/05/24 0618 01/06/24 0441  NA 138 133*  K 4.2 3.3*  CL 107 101  CO2 26 25  GLUCOSE 149* 141*  BUN 24* 15  CREATININE 0.37* 0.30*  CALCIUM 8.2* 7.9*   PT/INR Recent Labs    01/04/24 1122  LABPROT 13.7  INR 1.0     ABG No results for input(s): "PHART", "HCO3" in the last 72 hours.  Invalid input(s): "PCO2", "PO2"   Studies/Results:  Anti-infectives: Anti-infectives (From admission, onward)    Start     Dose/Rate Route Frequency Ordered Stop   12/29/23 1630  cefTRIAXone (ROCEPHIN) 2 g in sodium chloride 0.9 % 100 mL IVPB       Note to Pharmacy: Pharmacy may adjust dosing strength / duration / interval for maximal efficacy   2 g 200 mL/hr over 30 Minutes Intravenous Every 24 hours 12/29/23 1536     12/29/23 1630  metroNIDAZOLE (FLAGYL) IVPB 500 mg        500 mg 100 mL/hr over 60 Minutes Intravenous  Every 12 hours 12/29/23 1536     12/24/23 1000  piperacillin-tazobactam (ZOSYN) IVPB 3.375 g  Status:  Discontinued        3.375 g 12.5 mL/hr over 240 Minutes Intravenous Every 8 hours 12/24/23 0913 12/29/23 1536   12/13/23 1700  piperacillin-tazobactam (ZOSYN) IVPB 3.375 g        3.375 g 12.5 mL/hr over 240 Minutes Intravenous Every 8 hours 12/13/23 1412 12/13/23 2139   11/30/23 1230  fluconazole (DIFLUCAN) IVPB 400 mg  Status:  Discontinued        400 mg 100 mL/hr over 120 Minutes Intravenous Every 24 hours 11/30/23 1135 12/28/23 1321   11/26/23 0945  piperacillin-tazobactam (ZOSYN) IVPB 3.375 g  Status:  Discontinued        3.375 g 12.5 mL/hr over 240 Minutes Intravenous Every 8 hours 11/26/23 0853 12/13/23 1412   11/23/23 2100  ceFAZolin (ANCEF) IVPB 2g/100 mL premix        2 g 200 mL/hr over 30 Minutes Intravenous Every 8 hours 11/23/23 1654 11/23/23 2214   11/23/23 0700  ceFAZolin (ANCEF) IVPB 2g/100 mL premix        2 g 200 mL/hr over 30 Minutes Intravenous On call to O.R. 11/23/23 0654 11/23/23 1249  Assessment/Plan: Christine Morse is an 88 year old female status post Whipple Procedure on 11/23/2023, (POD 44).  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 Rare candida seen on final culture. Completed Zosyn. Fluc stopped yesterday as repeat cx negative for candida. Repeat CT 1/2 with abdominal wall collection now s/p IR aspiration 1/3 and bedside I&D 1/4.  Zosyn restarted 1/3 - stopped 1/8 and switched to ceftriaxone/flagyl based on cx of citrobacter and b frag.   Pancreatic leak - some leakage via abd wound. R drain removed 12/31/23.  Continue left sided drain and dressing changes.  Left drain might be able to be removed in next 3-5 days if output remains scant.  Leak amount appears to be diminishing.   Delayed gastric  emptying. Qtc ok.  Continue reglan. TPN. Failed clamping trial again 12/27/22. G tube placed.  Keep to gravity now.  Try sips of clears.  Will try clamping trials Friday or saturday.  Do not anticipate starting any significant tube feeds via the g tube until we see if delayed gastric emptying is resolved.   HOCM - Continue beta blockade and treat hypertension. IV metoprolol q4h per Cardiology.   Anemia of chronic disease, chronic blood loss anemia, and acute blood loss anemia- S/p 1U PRBC 12/27.  Hyperglycemia - improving, monitor   Hypophosphatemia - resolved Hypokalemia - resolved. Pharmacy monitoring.  Severe protein calorie malnutrition - TNA.  RUE DVT - treatment dose lovenox.  New picc placed opposite side.   OOB, IS, PT consult.  Thrush - nystatin suspension  FEN - TPN. Cont PPI BID & Carafate via tube. Marland Kitchen   VTE - lovenox treatment dose - cont. Right sided picc out.   ID - Fluconazole- stopped 1/7, resumed Zosyn 1/3 >> afebrile with normal WBCs.  Switched to ceftriaxone and flagyl 1/8.  Dispo: currently stable to go out with g tube to gravity and TNA for nutrition if SNF available that takes TNA.  Also would need PT, drain care, abdominal dressing changes at least 2-3 times per day.  Leak appears to be improving.  Plan g tube clamping trials in next few days.  Consider LTAC if stable beginning/mid next week.    LOS: 44 days    01/06/2024, 3:48 PM   Christine Diego, MD, FACS, FSSO Surgical Oncology, General Surgery, Trauma and Critical [facility] Surgery, [place] [phone] for weekday/non holidays Check [email or web address] for coverage night/weekend/holidays

## 2024-01-06 NOTE — Progress Notes (Addendum)
PHARMACY - TOTAL PARENTERAL NUTRITION CONSULT NOTE  Indication:  delayed gastric emptying  Patient Measurements: Height: 4\' 10"  (147.3 cm) Weight: 37.2 kg (82 lb 0.2 oz) IBW/kg (Calculated) : 40.9 TPN AdjBW (KG): 44.5 Body mass index is 17.14 kg/m. 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  Patient was started on a CLD on 12/6-12/9 and then advanced to a soft diet since 12/10.  Patient has had minimal PO intake since being post-op and continues to experience nausea. Delayed gastric emptying likely related to possible pancreatic leak.  Pharmacy consulted to dose TPN.  Glucose / Insulin: A1c 3.7 (11/15/23) - glucose <180, 1 units SSI since cycling  Electrolytes: Na 133, K 3.3, Cl 101, CO2 25, Ca 7.9 [CoCa 9.18], Phos 3.1, Mag 1.8 Renal: SCr < 1, BUN WNL Hepatic: AST/ALT/tbili WNL, alk phos wnl, albumin 2.4, TG 90 Intake / Output; MIVF: UOP 3x unmeasured, NGT not measured, drain 400 mL, LBM 1/13. Net I/O not accurate given unmeasured UOP.  GI Imaging: 12/7 XR: unremarkable bowel gas pattern. 12/11 CT: possible post-op seroma or hematoma vs abscess, distal colonic diverticulosis 12/15 XR: expected post-op changes, NGT in correct place 12/26 CT: perc drain cath adj left lobe liver, no discernable residual fluid collection. Post whipple w/ no evidence pancreatic ductal dilatation or peripancreatic fluid collection, small amount of free fluid in pelvis 1/2 CT - minimal fluid near drain in abdomen  1/11, 1/13 confirmed enteric tube placement  GI Surgeries / Procedures:  12/3 diagnostic laparoscopy, Whipple procedure, pancreatic duct stent 12/13 subhepatic fluid collection aspiration and drainage 1/3 IR US drain placement of abdominal abscess 1/15- IR placement gastrostomy tube   Central access:  PICC replaced 12/27/23 TPN start date: 12/02/23  Nutritional Goals: RD Estimated Needs Total Energy Estimated Needs: 1700-2000 kcal Total Protein Estimated Needs: 85-100 gm Total Fluid Estimated Needs: 1 ml/kcal  Current Nutrition:  TPN  Plan:  ~0030, RN noted a crack in the TPN tube chamber. VAT took down TPN and D10 was administered in place Cyclic TPN - continue today to 16 hours from 18 hours. Will only be able to cycle down to 16 hours per day to be under 7 mg/kg/min GIR (at 120 mL/hr, GIR is 6.99).  Will consider concentrated AA to clinisol 15% if patient develops edema with higher infusion rates, continue 10% travasol for now.  1800 mL TPN rate 60-120 mL/hr to provide 1732 kCal and 99g AA, meeting 100% of nutritional needs.  Electrolytes in TPN: Na 75mEq/L, K 27mEq/L, Ca 77mEq/L, Mg 69mEq/L, Phos 60mmol/L, change to Cl:Ac 1:1 on 1/14.  Mag 2 grams iv x1 K runs iv x6 Monitor TPN labs on Mon/Thurs Bmet / mag 1/17 Continue sensitive sliding scale and CBGs 1 hr after TPN starts, one at peak, one at 1 hr after TPN stops and 1 while off  F/up resolution of delayed gastric emptying and use of G-tube for nutritional needs (do not anticipate this happening soon. written 01/06/24)  Greta Doom BS, PharmD, BCPS Clinical Pharmacist 01/06/2024 10:12 AM  Contact: [phone] after 3 PM  "Be curious, not judgmental..." -Debbora Dus

## 2024-01-07 DIAGNOSIS — E43 Unspecified severe protein-calorie malnutrition: Secondary | ICD-10-CM

## 2024-01-07 LAB — BASIC METABOLIC PANEL
Anion gap: 6 (ref 5–15)
BUN: 22 mg/dL (ref 8–23)
CO2: 23 mmol/L (ref 22–32)
Calcium: 7.9 mg/dL — ABNORMAL LOW (ref 8.9–10.3)
Chloride: 105 mmol/L (ref 98–111)
Creatinine, Ser: 0.39 mg/dL — ABNORMAL LOW (ref 0.44–1.00)
GFR, Estimated: >60 mL/min (ref >60)
Glucose, Bld: 140 mg/dL — ABNORMAL HIGH (ref 70–99)
Potassium: 4.8 mmol/L (ref 3.5–5.1)
Sodium: 134 mmol/L — ABNORMAL LOW (ref 135–145)

## 2024-01-07 LAB — GLUCOSE, CAPILLARY
Glucose-Capillary: 142 mg/dL — ABNORMAL HIGH (ref 70–99)
Glucose-Capillary: 135 mg/dL — ABNORMAL HIGH (ref 70–99)
Glucose-Capillary: 103 mg/dL — ABNORMAL HIGH (ref 70–99)
Glucose-Capillary: 89 mg/dL (ref 70–99)
Glucose-Capillary: 157 mg/dL — ABNORMAL HIGH (ref 70–99)

## 2024-01-07 LAB — MAGNESIUM: Magnesium: 2.1 mg/dL (ref 1.7–2.4)

## 2024-01-07 MED ORDER — TRACE MINERALS CU-MN-SE-ZN 300-55-60-3000 MCG/ML IV SOLN
INTRAVENOUS | Status: AC
  Filled 2024-01-07: qty 990

## 2024-01-07 NOTE — Progress Notes (Signed)
45 Days Post-Op   Subjective/Chief Complaint: No acute events.   Objective: Vital signs in last 24 hours: Temp:  [97.7 F (36.5 C)-99.1 F (37.3 C)] 97.7 F (36.5 C) (01/17 1140) Pulse Rate:  [80-96] 83 (01/17 1140) Resp:  [16-18] 18 (01/17 0735) BP: (116-149)/(53-71) 138/62 (01/17 1140) SpO2:  [97 %-98 %] 98 % (01/17 1140) Weight:  [50.2 kg] 50.2 kg (01/17 0515) Last BM Date : 01/03/24  Intake/Output from previous day: 01/16 0701 - 01/17 0700 In: 1440.7 [I.V.:1081.4; IV Piggyback:359.3] Out: 820 [Drains:820] Intake/Output this shift: No intake/output data recorded.  Physical Exam:  General appearance: OOB in chair.  conversant Resp: breathing comfortably GI: soft, non distended. No rigidity or guarding. Midline wound with drainage, but much less. G tube in place with bilious drainage.              D1 - LLQ - purulent appearing, but scant amount Extremities: tr LE edema  Lab Results:  Recent Labs    01/05/24 0822  WBC 9.9  HGB 9.5*  HCT 31.5*  PLT 487*    BMET Recent Labs    01/06/24 0441 01/07/24 0549  NA 133* 134*  K 3.3* 4.8  CL 101 105  CO2 25 23  GLUCOSE 141* 140*  BUN 15 22  CREATININE 0.30* 0.39*  CALCIUM 7.9* 7.9*   PT/INR No results for input(s): "LABPROT", "INR" in the last 72 hours.    ABG No results for input(s): "PHART", "HCO3" in the last 72 hours.  Invalid input(s): "PCO2", "PO2"   Studies/Results:  Anti-infectives: Anti-infectives (From admission, onward)    Start     Dose/Rate Route Frequency Ordered Stop   12/29/23 1630  cefTRIAXone (ROCEPHIN) 2 g in sodium chloride 0.9 % 100 mL IVPB       Note to Pharmacy: Pharmacy may adjust dosing strength / duration / interval for maximal efficacy   2 g 200 mL/hr over 30 Minutes Intravenous Every 24 hours 12/29/23 1536     12/29/23 1630  metroNIDAZOLE (FLAGYL) IVPB 500 mg        500 mg 100 mL/hr over 60 Minutes Intravenous Every 12 hours 12/29/23 1536     12/24/23 1000   piperacillin-tazobactam (ZOSYN) IVPB 3.375 g  Status:  Discontinued        3.375 g 12.5 mL/hr over 240 Minutes Intravenous Every 8 hours 12/24/23 0913 12/29/23 1536   12/13/23 1700  piperacillin-tazobactam (ZOSYN) IVPB 3.375 g        3.375 g 12.5 mL/hr over 240 Minutes Intravenous Every 8 hours 12/13/23 1412 12/13/23 2139   11/30/23 1230  fluconazole (DIFLUCAN) IVPB 400 mg  Status:  Discontinued        400 mg 100 mL/hr over 120 Minutes Intravenous Every 24 hours 11/30/23 1135 12/28/23 1321   11/26/23 0945  piperacillin-tazobactam (ZOSYN) IVPB 3.375 g  Status:  Discontinued        3.375 g 12.5 mL/hr over 240 Minutes Intravenous Every 8 hours 11/26/23 0853 12/13/23 1412   11/23/23 2100  ceFAZolin (ANCEF) IVPB 2g/100 mL premix        2 g 200 mL/hr over 30 Minutes Intravenous Every 8 hours 11/23/23 1654 11/23/23 2214   11/23/23 0700  ceFAZolin (ANCEF) IVPB 2g/100 mL premix        2 g 200 mL/hr over 30 Minutes Intravenous On call to O.R. 11/23/23 0654 11/23/23 1249       Assessment/Plan: Ms. Schillo is an 88 year old female status post Whipple Procedure on  11/23/2023, (POD 45).  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 Rare candida seen on final culture. Completed Zosyn. Fluc stopped yesterday as repeat cx negative for candida. Repeat CT 1/2 with abdominal wall collection now s/p IR aspiration 1/3 and bedside I&D 1/4.  Zosyn restarted 1/3 - stopped 1/8 and switched to ceftriaxone/flagyl based on cx of citrobacter and b frag.   Pancreatic leak - some leakage via abd wound. R drain removed 12/31/23.  Continue left sided drain and dressing changes.  Left drain might be able to be removed in next 3-5 days if output remains scant.  Leak amount appears to be diminishing.   Delayed gastric emptying. Qtc ok.  Continue reglan. TPN. Failed clamping  trial again 12/27/22. G tube placed.  Keep to gravity now.  Try sips of clears.  Will try clamping trials Saturday.  Do not anticipate starting any significant tube feeds via the g tube until we see if delayed gastric emptying is resolved.   HOCM - Continue beta blockade and treat hypertension. IV metoprolol q4h per Cardiology.   Anemia of chronic disease, chronic blood loss anemia, and acute blood loss anemia- S/p 1U PRBC 12/27.  Hyperglycemia - improving, monitor   Hypophosphatemia - resolved Hypokalemia - resolved. Pharmacy monitoring.  Severe protein calorie malnutrition - TNA.  RUE DVT - treatment dose lovenox.  New picc placed opposite side.   OOB, IS, PT consult.  Thrush - nystatin suspension  FEN - TPN. Cont PPI BID & Carafate via tube. Marland Kitchen   VTE - lovenox treatment dose - cont. Right sided picc out.   ID - Fluconazole- stopped 1/7, resumed Zosyn 1/3 >> afebrile with normal WBCs.  Switched to ceftriaxone and flagyl 1/8.  Dispo: currently stable to go out with g tube to gravity and TNA for nutrition if SNF available that takes TNA.  Also would need PT, drain care, abdominal dressing changes at least 2-3 times per day.  Leak appears to be improving.  Plan g tube clamping trials in next few days.  Consider LTAC if stable beginning/mid next week.    LOS: 45 days    01/07/2024, 1:21 PM   Maudry Diego, MD, FACS, FSSO Surgical Oncology, General Surgery, Trauma and Critical [facility] Surgery, [place] [phone] for weekday/non holidays Check [email or web address] for coverage night/weekend/holidays

## 2024-01-07 NOTE — Progress Notes (Signed)
  Daily Progress Note   Patient Name: Christine Morse       Date: 01/07/2024 DOB: 05/27/1936  Age: 88 y.o. MRN#: [id number] Attending Physician: Almond Lint, MD Primary Care Physician: [name], MD Admit Date: 11/23/2023 Length of Stay: 45 days  Reason for Consultation/Follow-up: {Reason for Consult:23484}  HPI/Patient Profile:  ***  Subjective:   Subjective: Chart Reviewed. Updates received. Patient Assessed. Created space and opportunity for patient  and family to explore thoughts and feelings regarding current medical situation.  Today's Discussion: ***  Review of Systems  Objective:   Vital Signs:  BP (!) 149/70 (BP Location: Right Arm)   Pulse 88   Temp 99.1 F (37.3 C) (Oral)   Resp 18   Ht 4\' 10"  (1.473 m)   Wt 50.2 kg   SpO2 98%   BMI 23.13 kg/m   Physical Exam  Palliative Assessment/Data: ***    Existing Vynca/ACP Documentation: ***  Assessment & Plan:   Impression: Present on Admission:  Duodenal adenoma  ***  SUMMARY OF RECOMMENDATIONS   ***  Symptom Management:  ***  Code Status: {Palliative Code status:23503}  Prognosis: {Palliative Care Prognosis:23504}  Discharge Planning: {Palliative dispostion:23505}  Discussed with: ***  Thank you for allowing [place] to participate in the care of Christine Morse PMT will continue to support holistically.  Time Total: ***  Detailed review of medical records (labs, imaging, vital signs), medically appropriate exam, discussed with treatment team, counseling and education to patient, family, & staff, documenting clinical information, medication management, coordination of care  Wynne Dust, NP Palliative Medicine Team  Team Phone # [phone] (Nights/Weekends)  08/19/2021, 8:17 AM

## 2024-01-07 NOTE — Progress Notes (Signed)
PHARMACY - TOTAL PARENTERAL NUTRITION CONSULT NOTE  Indication:  delayed gastric emptying  Patient Measurements: Height: 4\' 10"  (147.3 cm) Weight: 50.2 kg (110 lb 10.7 oz) IBW/kg (Calculated) : 40.9 TPN AdjBW (KG): 44.5 Body mass index is 23.13 kg/m. 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  Patient was started on a CLD on 12/6-12/9 and then advanced to a soft diet since 12/10.  Patient has had minimal PO intake since being post-op and continues to experience nausea. Delayed gastric emptying likely related to possible pancreatic leak.  Pharmacy consulted to dose TPN.  Glucose / Insulin: A1c 3.7 (11/15/23) - glucose <180, 2 units SSI since cycling  Electrolytes: Na 134, K 4.8, Cl 105, CO2 23, Ca 7.9 [CoCa 9.18], Phos 3.1, Mag 2.1 Renal: SCr < 1, BUN WNL Hepatic: AST/ALT/tbili WNL, alk phos wnl, albumin 2.4, TG 90 Intake / Output; MIVF: UOP 1x unmeasured, NGT not measured, drain 820 mL, LBM 1/16- 1 unmeasured stool. Net I/O not accurate given unmeasured UOP.  GI Imaging: 12/7 XR: unremarkable bowel gas pattern. 12/11 CT: possible post-op seroma or hematoma vs abscess, distal colonic diverticulosis 12/15 XR: expected post-op changes, NGT in correct place 12/26 CT: perc drain cath adj left lobe liver, no discernable residual fluid collection. Post whipple w/ no evidence pancreatic ductal dilatation or peripancreatic fluid collection, small amount of free fluid in pelvis 1/2 CT - minimal fluid near drain in abdomen  1/11, 1/13 confirmed enteric tube placement  GI Surgeries / Procedures:  12/3 diagnostic laparoscopy, Whipple procedure, pancreatic duct stent 12/13 subhepatic fluid collection aspiration and drainage 1/3 IR US drain placement of abdominal abscess 1/15- IR placement gastrostomy  tube   Central access: PICC replaced 12/27/23 TPN start date: 12/02/23  Nutritional Goals: RD Estimated Needs Total Energy Estimated Needs: 1700-2000 kcal Total Protein Estimated Needs: 85-100 gm Total Fluid Estimated Needs: 1 ml/kcal  Current Nutrition:  TPN  Plan:  ~0030, RN noted a crack in the TPN tube chamber. VAT took down TPN and D10 was administered in place Cyclic TPN - continue today to 16 hours from 18 hours. Will only be able to cycle down to 16 hours per day to be under 7 mg/kg/min GIR (at 120 mL/hr, GIR is 6.99).  Will consider concentrated AA to clinisol 15% if patient develops edema with higher infusion rates, continue 10% travasol for now.  1800 mL TPN rate 60-120 mL/hr to provide 1732 kCal and 99g AA, meeting 100% of nutritional needs.  Electrolytes in TPN: Na 53mEq/L, K 2mEq/L, Ca 45mEq/L, Mg 42mEq/L, Phos 85mmol/L, change to Cl:Ac 1:2 on 1/17.  MVI added to TPN Monitor TPN labs on Mon/Thurs Bmet 1/18 Continue sensitive sliding scale and CBGs 1 hr after TPN starts, one at peak, one at 1 hr after TPN stops and 1 while off  F/up resolution of delayed gastric emptying and use of G-tube for nutritional needs (do not anticipate this happening soon. written 01/06/24)  Greta Doom BS, PharmD, BCPS Clinical Pharmacist 01/07/2024 7:12 AM  Contact: [phone] after 3 PM  "Be curious, not judgmental..." -Debbora Dus

## 2024-01-08 LAB — BASIC METABOLIC PANEL
Anion gap: 4 — ABNORMAL LOW (ref 5–15)
BUN: 24 mg/dL — ABNORMAL HIGH (ref 8–23)
CO2: 24 mmol/L (ref 22–32)
Calcium: 7.9 mg/dL — ABNORMAL LOW (ref 8.9–10.3)
Chloride: 106 mmol/L (ref 98–111)
Creatinine, Ser: 0.45 mg/dL (ref 0.44–1.00)
GFR, Estimated: >60 mL/min (ref >60)
Glucose, Bld: 150 mg/dL — ABNORMAL HIGH (ref 70–99)
Potassium: 4.8 mmol/L (ref 3.5–5.1)
Sodium: 134 mmol/L — ABNORMAL LOW (ref 135–145)

## 2024-01-08 LAB — CBC
HCT: 23.2 % — ABNORMAL LOW (ref 36.0–46.0)
Hemoglobin: 7.5 g/dL — ABNORMAL LOW (ref 12.0–15.0)
MCH: 34.1 pg — ABNORMAL HIGH (ref 26.0–34.0)
MCHC: 32.3 g/dL (ref 30.0–36.0)
MCV: 105.5 fL — ABNORMAL HIGH (ref 80.0–100.0)
Platelets: 328 10*3/uL (ref 150–400)
RBC: 2.2 MIL/uL — ABNORMAL LOW (ref 3.87–5.11)
RDW: 19 % — ABNORMAL HIGH (ref 11.5–15.5)
WBC: 7.1 10*3/uL (ref 4.0–10.5)
nRBC: 0.6 % — ABNORMAL HIGH (ref 0.0–0.2)

## 2024-01-08 LAB — GLUCOSE, CAPILLARY
Glucose-Capillary: 136 mg/dL — ABNORMAL HIGH (ref 70–99)
Glucose-Capillary: 156 mg/dL — ABNORMAL HIGH (ref 70–99)
Glucose-Capillary: 103 mg/dL — ABNORMAL HIGH (ref 70–99)
Glucose-Capillary: 135 mg/dL — ABNORMAL HIGH (ref 70–99)

## 2024-01-08 MED ORDER — TRAVASOL 10 % IV SOLN
INTRAVENOUS | Status: DC
  Filled 2024-01-08: qty 990

## 2024-01-08 MED ORDER — TRAVASOL 10 % IV SOLN
INTRAVENOUS | Status: AC
Start: 2024-01-08 — End: 2024-01-09
  Filled 2024-01-08: qty 990

## 2024-01-08 NOTE — Progress Notes (Addendum)
PHARMACY - TOTAL PARENTERAL NUTRITION CONSULT NOTE  Indication:  delayed gastric emptying  Patient Measurements: Height: 4\' 10"  (147.3 cm) Weight: 50.2 kg (110 lb 10.7 oz) IBW/kg (Calculated) : 40.9 TPN AdjBW (KG): 44.5 Body mass index is 23.13 kg/m. 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  Patient was started on a CLD on 12/6-12/9 and then advanced to a soft diet since 12/10.  Patient has had minimal PO intake since being post-op and continues to experience nausea. Delayed gastric emptying likely related to possible pancreatic leak.  Pharmacy consulted to dose TPN.  Glucose / Insulin: A1c 3.7 (11/15/23) - glucose <180, 3 units SSI since cycling  Electrolytes: Na 134, K 4.8, Cl 106, CO2 24, Ca 7.9 [CoCa 9.18], Phos 3.1, Mag 2.1 Renal: SCr < 1, BUN WNL Hepatic: AST/ALT/tbili WNL, alk phos wnl, albumin 2.4, TG 90 Intake / Output; MIVF: UOP 1x unmeasured, NGT not measured, drain 835 mL, LBM 1/16- 1 unmeasured stool. Net I/O not accurate given unmeasured UOP.  GI Imaging: 12/7 XR: unremarkable bowel gas pattern. 12/11 CT: possible post-op seroma or hematoma vs abscess, distal colonic diverticulosis 12/15 XR: expected post-op changes, NGT in correct place 12/26 CT: perc drain cath adj left lobe liver, no discernable residual fluid collection. Post whipple w/ no evidence pancreatic ductal dilatation or peripancreatic fluid collection, small amount of free fluid in pelvis 1/2 CT - minimal fluid near drain in abdomen  1/11, 1/13 confirmed enteric tube placement  GI Surgeries / Procedures:  12/3 diagnostic laparoscopy, Whipple procedure, pancreatic duct stent 12/13 subhepatic fluid collection aspiration and drainage 1/3 IR US drain placement of abdominal abscess 1/15- IR placement gastrostomy  tube   Central access: PICC replaced 12/27/23 TPN start date: 12/02/23  Nutritional Goals: RD Estimated Needs Total Energy Estimated Needs: 1700-2000 kcal Total Protein Estimated Needs: 85-100 gm Total Fluid Estimated Needs: 1 ml/kcal  Current Nutrition:  TPN  Plan:  Cyclic TPN - continue today to 16 hours from 18 hours. Will only be able to cycle down to 16 hours per day to be under 7 mg/kg/min GIR (at 120 mL/hr, GIR is 6.99).  Will consider concentrated AA to clinisol 15% if patient develops edema with higher infusion rates, continue 10% travasol for now.  1800 mL TPN rate 60-120 mL/hr to provide 1732 kCal and 99g AA, meeting 100% of nutritional needs.  Electrolytes in TPN: Na 15mEq/L, K 91mEq/L, Ca 81mEq/L, Mg 86mEq/L, Phos 13mmol/L, change to Cl:Ac 1:2 on 1/17.  MVI added to TPN Removed Trace elements from TPN. Will re-address on 1/19 Monitor TPN labs on Mon/Thurs Continue sensitive sliding scale and CBGs 1 hr after TPN starts, one at peak, one at 1 hr after TPN stops and 1 while off  F/up resolution of delayed gastric emptying and use of G-tube for nutritional needs (do not anticipate this happening soon. written 01/06/24)  Odetta Pink, PharmD, BCPS Clinical Pharmacist 01/08/2024 6:52 AM  Contact: [phone] after 3 PM  "Be curious, not judgmental..." -Debbora Dus

## 2024-01-08 NOTE — Progress Notes (Signed)
46 Days Post-Op   Subjective/Chief Complaint: No acute events.   Objective: Vital signs in last 24 hours: Temp:  [97.7 F (36.5 C)-100.8 F (38.2 C)] 98.7 F (37.1 C) (01/18 0729) Pulse Rate:  [83-95] 95 (01/18 0729) Resp:  [20] 20 (01/18 0729) BP: (128-143)/(55-74) 131/72 (01/18 0729) SpO2:  [96 %-98 %] 98 % (01/18 0336) Last BM Date : 01/03/24  Intake/Output from previous day: 01/17 0701 - 01/18 0700 In: 300 [IV Piggyback:300] Out: 835 [Drains:835] Intake/Output this shift: No intake/output data recorded.  Physical Exam:  General appearance: OOB in chair.  conversant Resp: breathing comfortably GI: soft, non distended. No rigidity or guarding. Midline wound with drainage, but much less. G tube in place with bilious drainage.  Extremities: tr LE edema  Lab Results:  Recent Labs    01/08/24 0500  WBC 7.1  HGB 7.5*  HCT 23.2*  PLT 328    BMET Recent Labs    01/07/24 0549 01/08/24 0710  NA 134* 134*  K 4.8 4.8  CL 105 106  CO2 23 24  GLUCOSE 140* 150*  BUN 22 24*  CREATININE 0.39* 0.45  CALCIUM 7.9* 7.9*   PT/INR No results for input(s): "LABPROT", "INR" in the last 72 hours.    ABG No results for input(s): "PHART", "HCO3" in the last 72 hours.  Invalid input(s): "PCO2", "PO2"   Studies/Results:  Anti-infectives: Anti-infectives (From admission, onward)    Start     Dose/Rate Route Frequency Ordered Stop   12/29/23 1630  cefTRIAXone (ROCEPHIN) 2 g in sodium chloride 0.9 % 100 mL IVPB       Note to Pharmacy: Pharmacy may adjust dosing strength / duration / interval for maximal efficacy   2 g 200 mL/hr over 30 Minutes Intravenous Every 24 hours 12/29/23 1536     12/29/23 1630  metroNIDAZOLE (FLAGYL) IVPB 500 mg        500 mg 100 mL/hr over 60 Minutes Intravenous Every 12 hours 12/29/23 1536     12/24/23 1000  piperacillin-tazobactam (ZOSYN) IVPB 3.375 g  Status:  Discontinued        3.375 g 12.5 mL/hr over 240 Minutes Intravenous Every 8  hours 12/24/23 0913 12/29/23 1536   12/13/23 1700  piperacillin-tazobactam (ZOSYN) IVPB 3.375 g        3.375 g 12.5 mL/hr over 240 Minutes Intravenous Every 8 hours 12/13/23 1412 12/13/23 2139   11/30/23 1230  fluconazole (DIFLUCAN) IVPB 400 mg  Status:  Discontinued        400 mg 100 mL/hr over 120 Minutes Intravenous Every 24 hours 11/30/23 1135 12/28/23 1321   11/26/23 0945  piperacillin-tazobactam (ZOSYN) IVPB 3.375 g  Status:  Discontinued        3.375 g 12.5 mL/hr over 240 Minutes Intravenous Every 8 hours 11/26/23 0853 12/13/23 1412   11/23/23 2100  ceFAZolin (ANCEF) IVPB 2g/100 mL premix        2 g 200 mL/hr over 30 Minutes Intravenous Every 8 hours 11/23/23 1654 11/23/23 2214   11/23/23 0700  ceFAZolin (ANCEF) IVPB 2g/100 mL premix        2 g 200 mL/hr over 30 Minutes Intravenous On call to O.R. 11/23/23 0654 11/23/23 1249       Assessment/Plan: Christine Morse is an 88 year old female status post Whipple Procedure on 11/23/2023, (POD 46).  Final pathology - adenocarcinoma duodenum, positive margin, positive LN - [id number]  Abdominal fluid collection - CT aspiration and drain placement 12/13 (anterior hepatic fluid collection) -  serosang. Rare candida seen on final culture. Completed Zosyn. Fluc stopped yesterday as repeat cx negative for candida. Repeat CT 1/2 with abdominal wall collection now s/p IR aspiration 1/3 and bedside I&D 1/4.  Zosyn restarted 1/3 - stopped 1/8 and switched to ceftriaxone/flagyl based on cx of citrobacter and b frag.   Pancreatic leak - some leakage via abd wound. R drain removed 12/31/23.  Continue left sided drain and dressing changes.  Left drain might be able to be removed in next 3-5 days if output remains scant.  Leak amount appears to be diminishing.   Delayed gastric emptying. Qtc ok.  Continue reglan. TPN. Failed clamping trial again 12/27/22. G tube placed. Remains high output. Keep to gravity now. HOCM - Continue beta blockade and treat hypertension.  IV metoprolol q4h per Cardiology.   Anemia of chronic disease, chronic blood loss anemia, and acute blood loss anemia- S/p 1U PRBC 12/27.  Hyperglycemia - improving, monitor   Hypophosphatemia - resolved Hypokalemia - resolved. Pharmacy monitoring.  Severe protein calorie malnutrition - TNA.  RUE DVT - treatment dose lovenox.  New picc placed opposite side.   OOB, IS, PT consult.  Thrush - nystatin suspension  FEN - TPN. Cont PPI BID & Carafate via tube. Marland Kitchen   VTE - lovenox treatment dose - cont. Right sided picc out.   ID - Fluconazole- stopped 1/7, resumed Zosyn 1/3 >> afebrile with normal WBCs.  Switched to ceftriaxone and flagyl 1/8.  Dispo: currently stable to go out with g tube to gravity and TNA for nutrition if SNF available that takes TNA.  Also would need PT, drain care, abdominal dressing changes at least 2-3 times per day.  Leak appears to be improving.  Plan g tube clamping trials in next few days.  Consider LTAC if stable beginning/mid next week.    LOS: 46 days    01/08/2024, 10:13 AM   Berna Bue MD FACS [phone] for weekday/non holidays Check [email or web address] for coverage night/weekend/holidays

## 2024-01-08 NOTE — Plan of Care (Signed)
  Problem: Education: Goal: Knowledge of General Education information will improve Description: Including pain rating scale, medication(s)/side effects and non-pharmacologic comfort measures Outcome: Progressing   Problem: Health Behavior/Discharge Planning: Goal: Ability to manage health-related needs will improve Outcome: Progressing   Problem: Clinical Measurements: Goal: Ability to maintain clinical measurements within normal limits will improve Outcome: Progressing Goal: Will remain free from infection Outcome: Progressing Goal: Diagnostic test results will improve Outcome: Progressing Goal: Cardiovascular complication will be avoided Outcome: Progressing   Problem: Activity: Goal: Risk for activity intolerance will decrease Outcome: Progressing   Problem: Nutrition: Goal: Adequate nutrition will be maintained Outcome: Progressing   Problem: Coping: Goal: Level of anxiety will decrease Outcome: Progressing   Problem: Elimination: Goal: Will not experience complications related to bowel motility Outcome: Progressing   Problem: Pain Management: Goal: General experience of comfort will improve Outcome: Progressing   Problem: Safety: Goal: Ability to remain free from injury will improve Outcome: Progressing   Problem: Skin Integrity: Goal: Risk for impaired skin integrity will decrease Outcome: Progressing   Problem: Education: Goal: Ability to describe self-care measures that may prevent or decrease complications (Diabetes Survival Skills Education) will improve Outcome: Progressing Goal: Individualized Educational Video(s) Outcome: Progressing   Problem: Coping: Goal: Ability to adjust to condition or change in health will improve Outcome: Progressing   Problem: Fluid Volume: Goal: Ability to maintain a balanced intake and output will improve Outcome: Progressing   Problem: Health Behavior/Discharge Planning: Goal: Ability to identify and utilize  available resources and services will improve Outcome: Progressing Goal: Ability to manage health-related needs will improve Outcome: Progressing   Problem: Metabolic: Goal: Ability to maintain appropriate glucose levels will improve Outcome: Progressing   Problem: Nutritional: Goal: Maintenance of adequate nutrition will improve Outcome: Progressing Goal: Progress toward achieving an optimal weight will improve Outcome: Progressing   Problem: Skin Integrity: Goal: Risk for impaired skin integrity will decrease Outcome: Progressing   Problem: Clinical Measurements: Goal: Ability to maintain clinical measurements within normal limits will improve Outcome: Progressing Goal: Postoperative complications will be avoided or minimized Outcome: Progressing   Problem: Skin Integrity: Goal: Demonstration of wound healing without infection will improve Outcome: Progressing

## 2024-01-08 NOTE — Plan of Care (Signed)
  Problem: Education: Goal: Knowledge of General Education information will improve Description: Including pain rating scale, medication(s)/side effects and non-pharmacologic comfort measures Outcome: Not Progressing   Problem: Health Behavior/Discharge Planning: Goal: Ability to manage health-related needs will improve Outcome: Not Progressing   

## 2024-01-08 NOTE — Progress Notes (Signed)
0700 Lab called RN to redraw BMP for a 2nd time stating it was contaminated (first time hemolyzed). RN called Lab to come and draw the BMP.

## 2024-01-08 NOTE — Progress Notes (Signed)
     Referral previously received for Gerilyn Pilgrim for goals of care discussion. Noted most recent palliative in-person assessment dated 01/07/2024 at which time it was recommended to follow from a distance/chart check.   Chart reviewed for Recent provider notes, nurse notes, vitals, and labs and updates received from RN.   At this time patient appears stable.  Drain remains with high output, surgery leaving to gravity.  Remains on TNA, okay for discharge to facility that accept TNA for nutrition for PT, drain care, abdominal dressing changes at least 2-3 times a day.  Plans to attempt G-tube clamping in the coming days.  Also consider possible LTAC placement.  No plan for in person follow-up today.  We will continue to shadow the chart, please contact the palliative medicine provider on service for any new/urgent needs that require our assistance with this patient.  Thank you for your referral and allowing PMT to assist in Stachia Keirstead Anctil's care.   Wynne Dust, NP Palliative Medicine Team Phone: [phone]  NO CHARGE

## 2024-01-09 LAB — GLUCOSE, CAPILLARY
Glucose-Capillary: 158 mg/dL — ABNORMAL HIGH (ref 70–99)
Glucose-Capillary: 88 mg/dL (ref 70–99)
Glucose-Capillary: 120 mg/dL — ABNORMAL HIGH (ref 70–99)
Glucose-Capillary: 130 mg/dL — ABNORMAL HIGH (ref 70–99)

## 2024-01-09 MED ORDER — TRACE MINERALS CU-MN-SE-ZN 300-55-60-3000 MCG/ML IV SOLN
INTRAVENOUS | Status: AC
  Filled 2024-01-09: qty 990

## 2024-01-09 MED ORDER — TRAVASOL 10 % IV SOLN: INTRAVENOUS | Status: DC

## 2024-01-09 MED ORDER — METOPROLOL TARTRATE 5 MG/5ML IV SOLN
INTRAVENOUS | Status: AC
  Filled 2024-01-09: qty 5

## 2024-01-09 NOTE — Progress Notes (Addendum)
PHARMACY - TOTAL PARENTERAL NUTRITION CONSULT NOTE  Indication:  delayed gastric emptying  Patient Measurements: Height: 4\' 10"  (147.3 cm) Weight: 51.1 kg (112 lb 10.5 oz) IBW/kg (Calculated) : 40.9 TPN AdjBW (KG): 44.5 Body mass index is 23.54 kg/m. 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  Patient was started on a CLD on 12/6-12/9 and then advanced to a soft diet since 12/10.  Patient has had minimal PO intake since being post-op and continues to experience nausea. Delayed gastric emptying likely related to possible pancreatic leak.  Pharmacy consulted to dose TPN.  Glucose / Insulin: A1c 3.7 (11/15/23) - glucose <180, 3 units SSI since cycling  Electrolytes: Na 134, K 4.8, Cl 106, CO2 24, Ca 7.9 [CoCa 9.18], Phos 3.1, Mag 2.1 Renal: SCr < 1, BUN WNL Hepatic: AST/ALT/tbili WNL, alk phos wnl, albumin 2.4, TG 90 Intake / Output; MIVF: UOP 2x unmeasured, NGT not measured, drain 1000 mL, LBM 1/16- 1 unmeasured stool. Net I/O not accurate given unmeasured UOP.  GI Imaging: 12/7 XR: unremarkable bowel gas pattern. 12/11 CT: possible post-op seroma or hematoma vs abscess, distal colonic diverticulosis 12/15 XR: expected post-op changes, NGT in correct place 12/26 CT: perc drain cath adj left lobe liver, no discernable residual fluid collection. Post whipple w/ no evidence pancreatic ductal dilatation or peripancreatic fluid collection, small amount of free fluid in pelvis 1/2 CT - minimal fluid near drain in abdomen  1/11, 1/13 confirmed enteric tube placement  GI Surgeries / Procedures:  12/3 diagnostic laparoscopy, Whipple procedure, pancreatic duct stent 12/13 subhepatic fluid collection aspiration and drainage 1/3 IR US drain placement of abdominal abscess 1/15- IR placement gastrostomy  tube   Central access: PICC replaced 12/27/23 TPN start date: 12/02/23  Nutritional Goals: RD Estimated Needs Total Energy Estimated Needs: 1700-2000 kcal Total Protein Estimated Needs: 85-100 gm Total Fluid Estimated Needs: 1 ml/kcal  Current Nutrition:  TPN  Plan:  Cyclic TPN - continue today to 16 hours from 18 hours. Will only be able to cycle down to 16 hours per day to be under 7 mg/kg/min GIR (at 120 mL/hr, GIR is 6.99).  Will consider concentrated AA to clinisol 15% if patient develops edema with higher infusion rates, continue 10% travasol for now.  1800 mL TPN rate 60-120 mL/hr to provide 1732 kCal and 99g AA, meeting 100% of nutritional needs.  Electrolytes in TPN: Na 76mEq/L, K 70mEq/L, Ca 8mEq/L, Mg 32mEq/L, Phos 35mmol/L, change to Cl:Ac 1:2 on 1/17.  MVI and trace added to TPN Monitor TPN labs on Mon/Thurs Continue sensitive sliding scale and CBGs 1 hr after TPN starts, one at peak, one at 1 hr after TPN stops and 1 while off  F/up resolution of delayed gastric emptying and use of G-tube for nutritional needs (do not anticipate this happening soon. written 01/06/24)  Odetta Pink, PharmD, BCPS Clinical Pharmacist 01/09/2024 6:56 AM  Contact: [phone] after 3 PM  "Be curious, not judgmental..." -Debbora Dus

## 2024-01-09 NOTE — Plan of Care (Signed)
  Problem: Education: Goal: Knowledge of General Education information will improve Description: Including pain rating scale, medication(s)/side effects and non-pharmacologic comfort measures Outcome: Not Progressing   Problem: Health Behavior/Discharge Planning: Goal: Ability to manage health-related needs will improve Outcome: Not Progressing   

## 2024-01-09 NOTE — Progress Notes (Signed)
47 Days Post-Op   Subjective/Chief Complaint: No acute events.   Objective: Vital signs in last 24 hours: Temp:  [97.4 F (36.3 C)-99.1 F (37.3 C)] 99.1 F (37.3 C) (01/19 0740) Pulse Rate:  [81-92] 81 (01/18 1522) Resp:  [14-16] 14 (01/19 0740) BP: (109-153)/(54-69) 119/59 (01/19 0740) SpO2:  [95 %-99 %] 95 % (01/19 0740) Weight:  [51.1 kg] 51.1 kg (01/19 0356) Last BM Date : 01/06/24  Intake/Output from previous day: 01/18 0701 - 01/19 0700 In: 3956.5 [P.O.:30; I.V.:3199.3; IV Piggyback:627.2] Out: 1000 [Drains:1000] Intake/Output this shift: No intake/output data recorded.  Physical Exam:  General appearance: OOB in chair.  conversant Resp: breathing comfortably GI: soft, non distended. No rigidity or guarding.  Surgical drain output with seropurulent drainage.. G tube in place with bilious drainage.  Extremities: tr LE edema  Lab Results:  Recent Labs    01/08/24 0500  WBC 7.1  HGB 7.5*  HCT 23.2*  PLT 328    BMET Recent Labs    01/07/24 0549 01/08/24 0710  NA 134* 134*  K 4.8 4.8  CL 105 106  CO2 23 24  GLUCOSE 140* 150*  BUN 22 24*  CREATININE 0.39* 0.45  CALCIUM 7.9* 7.9*   PT/INR No results for input(s): "LABPROT", "INR" in the last 72 hours.    ABG No results for input(s): "PHART", "HCO3" in the last 72 hours.  Invalid input(s): "PCO2", "PO2"   Studies/Results:  Anti-infectives: Anti-infectives (From admission, onward)    Start     Dose/Rate Route Frequency Ordered Stop   12/29/23 1630  cefTRIAXone (ROCEPHIN) 2 g in sodium chloride 0.9 % 100 mL IVPB       Note to Pharmacy: Pharmacy may adjust dosing strength / duration / interval for maximal efficacy   2 g 200 mL/hr over 30 Minutes Intravenous Every 24 hours 12/29/23 1536     12/29/23 1630  metroNIDAZOLE (FLAGYL) IVPB 500 mg        500 mg 100 mL/hr over 60 Minutes Intravenous Every 12 hours 12/29/23 1536     12/24/23 1000  piperacillin-tazobactam (ZOSYN) IVPB 3.375 g  Status:   Discontinued        3.375 g 12.5 mL/hr over 240 Minutes Intravenous Every 8 hours 12/24/23 0913 12/29/23 1536   12/13/23 1700  piperacillin-tazobactam (ZOSYN) IVPB 3.375 g        3.375 g 12.5 mL/hr over 240 Minutes Intravenous Every 8 hours 12/13/23 1412 12/13/23 2139   11/30/23 1230  fluconazole (DIFLUCAN) IVPB 400 mg  Status:  Discontinued        400 mg 100 mL/hr over 120 Minutes Intravenous Every 24 hours 11/30/23 1135 12/28/23 1321   11/26/23 0945  piperacillin-tazobactam (ZOSYN) IVPB 3.375 g  Status:  Discontinued        3.375 g 12.5 mL/hr over 240 Minutes Intravenous Every 8 hours 11/26/23 0853 12/13/23 1412   11/23/23 2100  ceFAZolin (ANCEF) IVPB 2g/100 mL premix        2 g 200 mL/hr over 30 Minutes Intravenous Every 8 hours 11/23/23 1654 11/23/23 2214   11/23/23 0700  ceFAZolin (ANCEF) IVPB 2g/100 mL premix        2 g 200 mL/hr over 30 Minutes Intravenous On call to O.R. 11/23/23 0654 11/23/23 1249       Assessment/Plan: Christine Morse is an 88 year old female status post Whipple Procedure on 11/23/2023, (POD 47).  Final pathology - adenocarcinoma duodenum, positive margin, positive LN - [id number]  Abdominal fluid collection -  CT aspiration and drain placement 12/13 (anterior hepatic fluid collection) - serosang. Rare candida seen on final culture. Completed Zosyn. Fluc stopped yesterday as repeat cx negative for candida. Repeat CT 1/2 with abdominal wall collection now s/p IR aspiration 1/3 and bedside I&D 1/4.  Zosyn restarted 1/3 - stopped 1/8 and switched to ceftriaxone/flagyl based on cx of citrobacter and b frag.   Pancreatic leak - some leakage via abd wound. R drain removed 12/31/23.  Continue left sided drain and dressing changes.  Left drain might be able to be removed in next 3-5 days if output remains scant.  Leak amount appears to be diminishing but output remains somewhat purulent appearing.   Delayed gastric emptying. Qtc ok.  Continue reglan. TPN. Failed clamping trial  again 12/27/22. G tube placed. Remains high output. Keep to gravity now.  May try clamping trials this week. HOCM - Continue beta blockade and treat hypertension. IV metoprolol q4h per Cardiology.   Anemia of chronic disease, chronic blood loss anemia, and acute blood loss anemia- S/p 1U PRBC 12/27.  Hyperglycemia - improving, monitor   Hypophosphatemia - resolved Hypokalemia - resolved. Pharmacy monitoring.  Severe protein calorie malnutrition - TNA.  RUE DVT - treatment dose lovenox.  New picc placed opposite side.   OOB, IS, PT consult.  Thrush - nystatin suspension  FEN - TPN. Cont PPI BID & Carafate via tube. Marland Kitchen   VTE - lovenox treatment dose - cont. Right sided picc out.   ID - Fluconazole- stopped 1/7, resumed Zosyn 1/3 >> afebrile with normal WBCs.  Switched to ceftriaxone and flagyl 1/8.  Dispo: currently stable to go out with g tube to gravity and TNA for nutrition if SNF available that takes TNA.  Also would need PT, drain care, abdominal dressing changes at least 2-3 times per day.  Leak appears to be improving.  Plan g tube clamping trials in next few days.  Consider LTAC if stable beginning/mid next week.    LOS: 47 days    01/09/2024, 8:12 AM   Berna Bue MD FACS [phone] for weekday/non holidays Check [email or web address] for coverage night/weekend/holidays

## 2024-01-10 LAB — COMPREHENSIVE METABOLIC PANEL
ALT: 19 U/L (ref 0–44)
AST: 21 U/L (ref 15–41)
Albumin: 2.3 g/dL — ABNORMAL LOW (ref 3.5–5.0)
Alkaline Phosphatase: 68 U/L (ref 38–126)
Anion gap: 6 (ref 5–15)
BUN: 20 mg/dL (ref 8–23)
CO2: 24 mmol/L (ref 22–32)
Calcium: 7.9 mg/dL — ABNORMAL LOW (ref 8.9–10.3)
Chloride: 103 mmol/L (ref 98–111)
Creatinine, Ser: 0.41 mg/dL — ABNORMAL LOW (ref 0.44–1.00)
GFR, Estimated: >60 mL/min (ref >60)
Glucose, Bld: 144 mg/dL — ABNORMAL HIGH (ref 70–99)
Potassium: 4.3 mmol/L (ref 3.5–5.1)
Sodium: 133 mmol/L — ABNORMAL LOW (ref 135–145)
Total Bilirubin: 0.4 mg/dL (ref 0.0–1.2)
Total Protein: 5.3 g/dL — ABNORMAL LOW (ref 6.5–8.1)

## 2024-01-10 LAB — GLUCOSE, CAPILLARY
Glucose-Capillary: 203 mg/dL — ABNORMAL HIGH (ref 70–99)
Glucose-Capillary: 145 mg/dL — ABNORMAL HIGH (ref 70–99)
Glucose-Capillary: 113 mg/dL — ABNORMAL HIGH (ref 70–99)
Glucose-Capillary: 115 mg/dL — ABNORMAL HIGH (ref 70–99)
Glucose-Capillary: 152 mg/dL — ABNORMAL HIGH (ref 70–99)

## 2024-01-10 LAB — MAGNESIUM: Magnesium: 1.7 mg/dL (ref 1.7–2.4)

## 2024-01-10 LAB — TRIGLYCERIDES: Triglycerides: 59 mg/dL (ref <150)

## 2024-01-10 LAB — PHOSPHORUS: Phosphorus: 3.2 mg/dL (ref 2.5–4.6)

## 2024-01-10 MED ORDER — TRAVASOL 10 % IV SOLN
INTRAVENOUS | Status: AC
  Filled 2024-01-10: qty 990

## 2024-01-10 MED ORDER — MAGNESIUM SULFATE 2 GM/50ML IV SOLN
2.0000 g | Freq: Once | INTRAVENOUS | Status: AC
Start: 2024-01-10 — End: 2024-01-10
  Administered 2024-01-10: 2 g via INTRAVENOUS
  Filled 2024-01-10: qty 50

## 2024-01-10 NOTE — Progress Notes (Signed)
Physical Therapy Treatment Patient Details Name: TERRION SPOHR MRN: [id number] DOB: March 14, 1936 Today's Date: 01/10/2024   History of Present Illness Patient is an 88 y/o female admitted 11/23/23 with diagnosis of duodenal adenoma and underwent laparoscopic Whipple procedure. post-op subcapsular fluid collection around the anterior aspect of the left liver lobe. Drain placement in IR 12/03/23. NGT inserted 12/15, dislodged then replaced.  Positive for R subclavian and axillary DVT on 12/07/23. 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    PT Comments  Pt was alert and more talkative/participative.  Emphasis on sliding to EOB, sit to stand and progression of gait quality  (larger symmetrical steps) and stamina.    If plan is discharge home, recommend the following: A little help with walking and/or transfers;A little help with bathing/dressing/bathroom;Assistance with cooking/housework;Assist for transportation   Can travel by private vehicle     Yes  Equipment Recommendations  Rolling walker (2 wheels);BSC/3in1    Recommendations for Other Services       Precautions / Restrictions Precautions Precautions: Fall Precaution Comments: LLQ Blake Drain     Mobility  Bed Mobility               General bed mobility comments: in the chair on arrival    Transfers Overall transfer level: Needs assistance Equipment used: Rolling walker (2 wheels) Transfers: Sit to/from Stand Sit to Stand: Min assist           General transfer comment: cues for hand placement and some forward assist, not so much boost    Ambulation/Gait Ambulation/Gait assistance: Min assist, Contact guard assist Gait Distance (Feet): 100 Feet (140 with rolling walker and sitting rest to look out into the sunshine.) Assistive device: Rolling walker (2 wheels) Gait Pattern/deviations: Step-through pattern, Decreased step length - right, Decreased step length - left,  Decreased stride length   Gait velocity interpretation: <1.8 ft/sec, indicate of risk for recurrent falls   General Gait Details: short low amplitude steps, flexed posture, looks much too often at the floor, but ambulated with the RW generally well.   Stairs             Wheelchair Mobility     Tilt Bed    Modified Rankin (Stroke Patients Only)       Balance     Sitting balance-[name]: Fair       Standing balance-[name]: Poor Standing balance comment: reliant on UE support                            Cognition Arousal: Alert Behavior During Therapy: WFL for tasks assessed/performed Overall Cognitive Status:  (NT formally)                                          Exercises      General Comments        Pertinent Vitals/Pain Pain Assessment Pain Assessment: Faces Faces Pain Scale: No hurt Pain Intervention(s): Monitored during session    Home Living                          Prior Function            PT Goals (current goals can now be found in the care plan section) Acute Rehab PT Goals PT Goal  Formulation: With patient Time For Goal Achievement: 01/17/24 Potential to Achieve Goals: Fair Progress towards PT goals: Progressing toward goals    Frequency    Min 1X/week      PT Plan      Co-evaluation              AM-PAC PT "6 Clicks" Mobility   Outcome Measure  Help needed turning from your back to your side while in a flat bed without using bedrails?: A Little Help needed moving from lying on your back to sitting on the side of a flat bed without using bedrails?: A Little Help needed moving to and from a bed to a chair (including a wheelchair)?: A Little Help needed standing up from a chair using your arms (e.g., wheelchair or bedside chair)?: A Little Help needed to walk in hospital room?: A Little Help needed climbing 3-5 steps with a railing? : A Lot 6 Click Score: 17    End  of Session   Activity Tolerance: Patient tolerated treatment well;No increased pain;Patient limited by fatigue Patient left: in chair;with call bell/phone within reach;with chair alarm set Nurse Communication: Mobility status PT Visit Diagnosis: Other abnormalities of gait and mobility (R26.89);Muscle weakness (generalized) (M62.81)     Time: 2841-3244 PT Time Calculation (min) (ACUTE ONLY): 26 min  Charges:    $Gait Training: 8-22 mins $Therapeutic Activity: 8-22 mins PT General Charges $$ ACUTE PT VISIT: 1 Visit                     01/10/2024  Jacinto Halim., PT Acute Rehabilitation Services [phone]  (office)   Eliseo Gum Chaney Ingram 01/10/2024, 7:09 PM

## 2024-01-10 NOTE — Progress Notes (Signed)
48 Days Post-Op   Subjective/Chief Complaint: No acute events.   Objective: Vital signs in last 24 hours: Temp:  [97.5 F (36.4 C)-98.8 F (37.1 C)] 98.8 F (37.1 C) (01/20 0732) Pulse Rate:  [84-95] 84 (01/19 1938) Resp:  [14] 14 (01/20 0732) BP: (105-149)/(50-66) 137/64 (01/20 0742) SpO2:  [96 %-99 %] 97 % (01/20 0742) Weight:  [51.4 kg] 51.4 kg (01/20 0333) Last BM Date : 01/09/24  Intake/Output from previous day: 01/19 0701 - 01/20 0700 In: 1757.5 [P.O.:177; I.V.:1280.5; IV Piggyback:300] Out: 1025 [Drains:1025] Intake/Output this shift: Total I/O In: -  Out: 175 [Drains:175]  Physical Exam:  General appearance: OOB in chair.  conversant Resp: breathing comfortably GI: soft, non distended. No rigidity or guarding.  Surgical drain output with seropurulent drainage.. G tube in place with bilious drainage.  Extremities: tr LE edema  Lab Results:  Recent Labs    01/08/24 0500  WBC 7.1  HGB 7.5*  HCT 23.2*  PLT 328    BMET Recent Labs    01/08/24 0710 01/10/24 0433  NA 134* 133*  K 4.8 4.3  CL 106 103  CO2 24 24  GLUCOSE 150* 144*  BUN 24* 20  CREATININE 0.45 0.41*  CALCIUM 7.9* 7.9*   PT/INR No results for input(s): "LABPROT", "INR" in the last 72 hours.    ABG No results for input(s): "PHART", "HCO3" in the last 72 hours.  Invalid input(s): "PCO2", "PO2"   Studies/Results:  Anti-infectives: Anti-infectives (From admission, onward)    Start     Dose/Rate Route Frequency Ordered Stop   12/29/23 1630  cefTRIAXone (ROCEPHIN) 2 g in sodium chloride 0.9 % 100 mL IVPB       Note to Pharmacy: Pharmacy may adjust dosing strength / duration / interval for maximal efficacy   2 g 200 mL/hr over 30 Minutes Intravenous Every 24 hours 12/29/23 1536     12/29/23 1630  metroNIDAZOLE (FLAGYL) IVPB 500 mg        500 mg 100 mL/hr over 60 Minutes Intravenous Every 12 hours 12/29/23 1536     12/24/23 1000  piperacillin-tazobactam (ZOSYN) IVPB 3.375 g   Status:  Discontinued        3.375 g 12.5 mL/hr over 240 Minutes Intravenous Every 8 hours 12/24/23 0913 12/29/23 1536   12/13/23 1700  piperacillin-tazobactam (ZOSYN) IVPB 3.375 g        3.375 g 12.5 mL/hr over 240 Minutes Intravenous Every 8 hours 12/13/23 1412 12/13/23 2139   11/30/23 1230  fluconazole (DIFLUCAN) IVPB 400 mg  Status:  Discontinued        400 mg 100 mL/hr over 120 Minutes Intravenous Every 24 hours 11/30/23 1135 12/28/23 1321   11/26/23 0945  piperacillin-tazobactam (ZOSYN) IVPB 3.375 g  Status:  Discontinued        3.375 g 12.5 mL/hr over 240 Minutes Intravenous Every 8 hours 11/26/23 0853 12/13/23 1412   11/23/23 2100  ceFAZolin (ANCEF) IVPB 2g/100 mL premix        2 g 200 mL/hr over 30 Minutes Intravenous Every 8 hours 11/23/23 1654 11/23/23 2214   11/23/23 0700  ceFAZolin (ANCEF) IVPB 2g/100 mL premix        2 g 200 mL/hr over 30 Minutes Intravenous On call to O.R. 11/23/23 0654 11/23/23 1249       Assessment/Plan: Ms. Chlebowski is an 88 year old female status post Whipple Procedure on 11/23/2023, (POD 48).  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 Rare candida seen on final culture. Completed Zosyn. Fluc stopped yesterday as repeat cx negative for candida. Repeat CT 1/2 with abdominal wall collection now s/p IR aspiration 1/3 and bedside I&D 1/4.  Zosyn restarted 1/3 - stopped 1/8 and switched to ceftriaxone/flagyl based on cx of citrobacter and b frag.   Pancreatic leak - some leakage via abd wound. R drain removed 12/31/23.  Continue left sided drain and dressing changes.  Left drain might be able to be removed in next 3-5 days if output remains scant.  Leak amount appears to be diminishing but output remains somewhat purulent appearing.   Delayed gastric emptying. Qtc ok.  Continue reglan. TPN. Failed clamping  trial again 12/27/22. G tube placed. Remains high output. Keep to gravity now.  May try clamping trials this week. HOCM - Continue beta blockade and treat hypertension. IV metoprolol q4h per Cardiology.   Anemia of chronic disease, chronic blood loss anemia, and acute blood loss anemia- S/p 1U PRBC 12/27.  Hyperglycemia - improving, monitor   Hypophosphatemia - resolved Hypokalemia - resolved. Pharmacy monitoring.  Severe protein calorie malnutrition - TNA.  RUE DVT - treatment dose lovenox.  New picc placed opposite side.   OOB, IS, PT consult.  Thrush - nystatin suspension  FEN - TPN. Cont PPI BID & Carafate via tube. Marland Kitchen   VTE - lovenox treatment dose - cont. Right sided picc out.   ID - Fluconazole- stopped 1/7, resumed Zosyn 1/3 >> afebrile with normal WBCs.  Switched to ceftriaxone and flagyl 1/8. Dr Donell Beers to decide length of duration  Dispo: currently stable to go out with g tube to gravity and TNA for nutrition if SNF available that takes TNA.  Also would need PT, drain care, abdominal dressing changes at least 2-3 times per day.  Leak appears to be improving.  Plan g tube clamping trials in next few days.  Consider LTAC if stable beginning/mid next week.    LOS: 48 days    01/10/2024, 12:08 PM   Gaynelle Adu MD FACS [phone] for weekday/non holidays Check [email or web address] for coverage night/weekend/holidays

## 2024-01-10 NOTE — Plan of Care (Signed)
  Problem: Education: Goal: Knowledge of General Education information will improve Description: Including pain rating scale, medication(s)/side effects and non-pharmacologic comfort measures Outcome: Progressing   Problem: Health Behavior/Discharge Planning: Goal: Ability to manage health-related needs will improve Outcome: Progressing   Problem: Clinical Measurements: Goal: Ability to maintain clinical measurements within normal limits will improve Outcome: Progressing Goal: Will remain free from infection Outcome: Progressing Goal: Diagnostic test results will improve Outcome: Progressing Goal: Cardiovascular complication will be avoided Outcome: Progressing   Problem: Activity: Goal: Risk for activity intolerance will decrease Outcome: Progressing   Problem: Nutrition: Goal: Adequate nutrition will be maintained Outcome: Progressing   Problem: Coping: Goal: Level of anxiety will decrease Outcome: Progressing   Problem: Elimination: Goal: Will not experience complications related to bowel motility Outcome: Progressing   Problem: Pain Management: Goal: General experience of comfort will improve Outcome: Progressing   Problem: Safety: Goal: Ability to remain free from injury will improve Outcome: Progressing   Problem: Skin Integrity: Goal: Risk for impaired skin integrity will decrease Outcome: Progressing   Problem: Education: Goal: Ability to describe self-care measures that may prevent or decrease complications (Diabetes Survival Skills Education) will improve Outcome: Progressing Goal: Individualized Educational Video(s) Outcome: Progressing   Problem: Coping: Goal: Ability to adjust to condition or change in health will improve Outcome: Progressing   Problem: Fluid Volume: Goal: Ability to maintain a balanced intake and output will improve Outcome: Progressing   Problem: Health Behavior/Discharge Planning: Goal: Ability to identify and utilize  available resources and services will improve Outcome: Progressing Goal: Ability to manage health-related needs will improve Outcome: Progressing   Problem: Metabolic: Goal: Ability to maintain appropriate glucose levels will improve Outcome: Progressing   Problem: Nutritional: Goal: Maintenance of adequate nutrition will improve Outcome: Progressing Goal: Progress toward achieving an optimal weight will improve Outcome: Progressing   Problem: Skin Integrity: Goal: Risk for impaired skin integrity will decrease Outcome: Progressing   Problem: Clinical Measurements: Goal: Ability to maintain clinical measurements within normal limits will improve Outcome: Progressing Goal: Postoperative complications will be avoided or minimized Outcome: Progressing   Problem: Skin Integrity: Goal: Demonstration of wound healing without infection will improve Outcome: Progressing

## 2024-01-10 NOTE — Progress Notes (Signed)
Nutrition Follow-up  DOCUMENTATION CODES:   Severe malnutrition in context of acute illness/injury  INTERVENTION:   - Diet advancement per MD  - TPN per pharmacy (Pharmacy to add MVI to TPN) - Daily weights   NUTRITION DIAGNOSIS:   Severe Malnutrition (in the context of acute illness) related to  (inadequate energy intake) as evidenced by energy intake < or equal to 50% for > or equal to 5 days, moderate fat depletion, moderate muscle depletion.  - Still applicable   GOAL:   Patient will meet greater than or equal to 90% of their needs  - Meeting via TPN   MONITOR:   PO intake, Supplement acceptance, Labs, Weight trends  REASON FOR ASSESSMENT:   Consult Assessment of nutrition requirement/status  ASSESSMENT:   Pt with hx of HTN, HLD, GERD, and diverticulosis presented for planned surgery after a large duodenal mass was seen on imaging at admission in November and was presumed to be malignant. Underwent pancreaticoduodenectomy and placement of pancreatic duct stent and confirmed tohave adenocarcinoma duodenum.  12/3 - Op, whipple procedure and placement of pancreatic duct stent.  12/10 - Soft diet  12/11 CT: possible post-op seroma or hematoma vs abscess, distal colonic diverticulosis  12/12 - TPN started,  12/13 - CT aspiration and drain placement, NPO 12/15 - NG inserted for persistent emesis  12/18- venous duplex positive for VTE; started on treatment dose lovenox 12/20- NGT clamping trials 12/21- NGT d/c, advanced to clear liquids 12/22- advanced to full liquids 12/23 - clear liquids 12/24 - NPO 12/25 NGT replaced 1/5-1/6 - Pt pulled PICC line, TPN stopped, replaced 1/6 1/10 - Pt pulled NGT, Right drain removed  1/13 - NGT replaced  1/15 - IR placement of PEG tube-for drainage    Ongoing delayed gastric emptying and small pancreatic leak. R drain removed 1/10. Left sided drain still in place. PEG placed 1/15, being used for drainage via gravity. Having high  output, 1025 ml x 24 hours.  Possible clamping trials this week. No tube feeds until delayed gastric emptying is resolved. TPN continuing, providing 1732 kcal and 99 gm protein, meeting 100% of nutritional needs. MD allowing sips of clears.  Last BM was 1/19.   Patient sitting in chair with persistent coughing. No N/V noted. Tolerating sips of clears. Weight was trending down, increased energy needs on 1/3 and then again on 1/7. Weight has been steadily increasing.    Admit weight: 55.3 kg  Current weight: 51.4 kg     Intake/Output Summary (Last 24 hours) at 01/10/2024 0849 Last data filed at 01/10/2024 0600 Gross per 24 hour  Intake 1757.45 ml  Output 1025 ml  Net 732.45 ml   Drains/Lines: Closed system drain 1 L LLQ: 0 ml x 24 hours PEG: 1025 ml x 24 hours   Nutritionally Relevant Medications: Scheduled Meds:  insulin aspart  0-9 Units Subcutaneous 4 times per day   metoCLOPramide (REGLAN) injection  5 mg Intravenous Q8H   metoprolol tartrate  7.5 mg Intravenous 6 X Daily   pantoprazole (PROTONIX) IV  40 mg Intravenous Q12H   sucralfate  1 g Per Tube TID   Continuous Infusions:  cefTRIAXone (ROCEPHIN)  IV Stopped (01/09/24 1551)   magnesium sulfate bolus IVPB     metronidazole 500 mg (01/10/24 0333)   TPN CYCLIC-ADULT (ION) 120 mL/hr at 01/10/24 0347   TPN CYCLIC-ADULT (ION)     Labs Reviewed: Sodium 133, Creatinine 0.41, Calcium 7.9  CBG ranges from 88-203 mg/dL over the last 24  hours HgbA1c 3.7 (10/2023)  NUTRITION - FOCUSED PHYSICAL EXAM:  Flowsheet Row Most Recent Value  Orbital Region Severe depletion  Upper Arm Region Severe depletion  Thoracic and Lumbar Region Severe depletion  Buccal Region Severe depletion  Temple Region Severe depletion  Clavicle Bone Region Severe depletion  Clavicle and Acromion Bone Region Severe depletion  Scapular Bone Region Severe depletion  Dorsal Hand Severe depletion  Patellar Region Severe depletion  Anterior Thigh Region  Severe depletion  Posterior Calf Region Severe depletion  Edema (RD Assessment) None  Hair Reviewed  Eyes Reviewed  Mouth Reviewed  Skin Reviewed  Nails Reviewed       Diet Order:   Diet Order     None       EDUCATION NEEDS:   Education needs have been addressed  Skin:  Skin Assessment: Skin Integrity Issues: Skin Integrity Issues:: Incisions Incisions: Closed Abdomen  Last BM:  1/19 type 6  Height:   Ht Readings from Last 1 Encounters:  11/23/23 4\' 10"  (1.473 m)    Weight:   Wt Readings from Last 1 Encounters:  01/10/24 51.4 kg    Ideal Body Weight:  43.9 kg  BMI:  Body mass index is 23.68 kg/m.  Estimated Nutritional Needs:   Kcal:  1700-2000 kcal  Protein:  85-100 gm  Fluid:  1 ml/kcal  Elliot Dally, RD Registered Dietitian  See Amion for more information

## 2024-01-10 NOTE — Progress Notes (Signed)
PHARMACY - TOTAL PARENTERAL NUTRITION CONSULT NOTE  Indication:  delayed gastric emptying  Patient Measurements: Height: 4\' 10"  (147.3 cm) Weight: 51.4 kg (113 lb 5.1 oz) IBW/kg (Calculated) : 40.9 TPN AdjBW (KG): 44.5 Body mass index is 23.68 kg/m. 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  Patient was started on a CLD on 12/6-12/9 and then advanced to a soft diet since 12/10.  Patient has had minimal PO intake since being post-op and continues to experience nausea. Delayed gastric emptying likely related to possible pancreatic leak.  Pharmacy consulted to dose TPN.  Glucose / Insulin: A1c 3.7 (11/15/23) - glucose 120-203, 4 units SSI since cycling  Electrolytes: Na 133, Corr Ca 9.3, Ma 1.7, others wnl Renal: SCr < 1, BUN WNL Hepatic: LFTs wnl, albumin 2.3, TG 59 Intake / Output; MIVF: drain 350 mL, LBM 1/19. Net I/O not accurate given unmeasured UOP.  GI Imaging: 12/7 XR: unremarkable bowel gas pattern. 12/11 CT: possible post-op seroma or hematoma vs abscess, distal colonic diverticulosis 12/15 XR: expected post-op changes, NGT in correct place 12/26 CT: perc drain cath adj left lobe liver, no discernable residual fluid collection. Post whipple w/ no evidence pancreatic ductal dilatation or peripancreatic fluid collection, small amount of free fluid in pelvis 1/2 CT - minimal fluid near drain in abdomen  1/11, 1/13 confirmed enteric tube placement  GI Surgeries / Procedures:  12/3 diagnostic laparoscopy, Whipple procedure, pancreatic duct stent 12/13 subhepatic fluid collection aspiration and drainage 1/3 IR US drain placement of abdominal abscess 1/15- IR placement gastrostomy tube   Central access: PICC replaced 12/27/23 TPN start date: 12/02/23  Nutritional Goals: RD Estimated  Needs Total Energy Estimated Needs: 1700-2000 kcal Total Protein Estimated Needs: 85-100 gm Total Fluid Estimated Needs: 1 ml/kcal  Current Nutrition:  TPN  Plan:  Cyclic TPN - continue today to 16 hours from 18 hours. Will only be able to cycle down to 16 hours per day to be under 7 mg/kg/min GIR (at 120 mL/hr, GIR is 6.99).  Cyclic TPN provides 1732 kCal and 99g AA, meeting 100% of nutritional needs.  Electrolytes in TPN: increase Na to 155mEq/L, K 37mEq/L, add Ca at 3 mEq/L, increase Mg to 78mEq/L, Phos 57mmol/L, change Cl:Ac back to 1:1.  Magnesium sulfate 2gm IV now MVI and trace added to TPN Monitor TPN labs on Mon/Thurs and prn - BMET, Mg in a.m. Continue sensitive sliding scale and CBGs 1 hr after TPN starts, one at peak, one at 1 hr after TPN stops and 1 while off  F/u resolution of delayed gastric emptying and use of G-tube for nutritional needs (do not anticipate this happening soon. written 01/06/24)  Christoper Fabian, PharmD, BCPS Please see amion for complete clinical pharmacist phone list 01/10/2024 8:25 AM

## 2024-01-10 NOTE — Progress Notes (Signed)
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 post-op subcapsular fluid collection around the anterior aspect of the left liver lobe. Drain placement in IR 12/03/23. NGT inserted 12/15, dislodged then replaced.  Positive for R subclavian and axillary DVT on 12/07/23. 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   OT comments  Patient with improved mobility this session, still lethargic with left flank discomfort.  Patient willing to stay in the recliner, but declines mobility to the bathroom for stand grooming.  OT discussed increasing mobility throughout the day, and moving more to increase independence.  Patient acknowledges the importance of movement.  OT will continue efforts in the acute setting, and Patient will benefit from continued inpatient follow up therapy, <3 hours/day.      If plan is discharge home, recommend the following:  A lot of help with bathing/dressing/bathroom;A little help with walking and/or transfers;Assist for transportation;Assistance with cooking/housework;Help with stairs or ramp for entrance   Equipment Recommendations  None recommended by OT    Recommendations for Other Services      Precautions / Restrictions Precautions Precautions: Fall Precaution Comments: LLQ Blake Drain Restrictions [employer] Bearing Restrictions Per Provider Order: No       Mobility Bed Mobility Overal bed mobility: Needs Assistance Bed Mobility: Supine to Sit     Supine to sit: Min assist, HOB elevated          Transfers Overall transfer level: Needs assistance Equipment used: Rolling walker (2 wheels) Transfers: Sit to/from Stand, Bed to chair/wheelchair/BSC Sit to Stand: Min assist     Step pivot  transfers: Min assist           Balance Overall balance assessment: Needs assistance Sitting-balance support: Feet supported Sitting balance-[name]: Fair     Standing balance support: Reliant on assistive device for balance Standing balance-[name]: Poor                             ADL either performed or assessed with clinical judgement   ADL       Grooming: Wash/dry hands;Wash/dry face;Set up;Sitting;Oral care                                      Extremity/Trunk Assessment Upper Extremity Assessment Upper Extremity Assessment: Generalized weakness   Lower Extremity Assessment Lower Extremity Assessment: Defer to PT evaluation   Cervical / Trunk Assessment Cervical / Trunk Assessment: Other exceptions Cervical / Trunk Exceptions: abdominal surgery    Vision Patient Visual Report: No change from baseline     Perception Perception Perception: Not tested   Praxis Praxis Praxis: Not tested    Cognition Arousal: Lethargic Behavior During Therapy: Flat affect Overall Cognitive Status: Difficult to assess                                 General Comments: Answering questions, follows commands, lethargic                           Pertinent Vitals/ Pain       Pain Assessment  Faces Pain Scale: Hurts a little bit Pain Location: LLQ Pain Descriptors / Indicators: Sore, Grimacing Pain Intervention(s): Monitored during session                                                          Frequency  Min 1X/week        Progress Toward Goals  OT Goals(current goals can now be found in the care plan section)  Progress towards OT goals: Progressing toward goals  Acute Rehab OT Goals OT Goal Formulation: With patient Time For Goal Achievement: 01/18/24 Potential to Achieve Goals: Good  Plan      Co-evaluation                 AM-PAC OT "6 Clicks" Daily Activity      Outcome Measure   Help from another person eating meals?: A Little Help from another person taking care of personal grooming?: A Little Help from another person toileting, which includes using toliet, bedpan, or urinal?: A Lot Help from another person bathing (including washing, rinsing, drying)?: A Lot Help from another person to put on and taking off regular upper body clothing?: A Little Help from another person to put on and taking off regular lower body clothing?: A Lot 6 Click Score: 15    End of Session Equipment Utilized During Treatment: Rolling walker (2 wheels)  OT Visit Diagnosis: Unsteadiness on feet (R26.81);Muscle weakness (generalized) (M62.81)   Activity Tolerance Patient tolerated treatment well   Patient Left in chair;with call bell/phone within reach;with chair alarm set   Nurse Communication Mobility status        Time: 1610-9604 OT Time Calculation (min): 19 min  Charges: OT General Charges $OT Visit: 1 Visit OT Treatments $Self Care/Home Management : 8-22 mins  01/10/2024  RP, OTR/L  Acute Rehabilitation Services  Office:  [phone]   Suzanna Obey 01/10/2024, 11:21 AM

## 2024-01-11 LAB — BASIC METABOLIC PANEL
Anion gap: 7 (ref 5–15)
BUN: 25 mg/dL — ABNORMAL HIGH (ref 8–23)
CO2: 24 mmol/L (ref 22–32)
Calcium: 7.8 mg/dL — ABNORMAL LOW (ref 8.9–10.3)
Chloride: 104 mmol/L (ref 98–111)
Creatinine, Ser: 0.36 mg/dL — ABNORMAL LOW (ref 0.44–1.00)
GFR, Estimated: >60 mL/min (ref >60)
Glucose, Bld: 144 mg/dL — ABNORMAL HIGH (ref 70–99)
Potassium: 4.5 mmol/L (ref 3.5–5.1)
Sodium: 135 mmol/L (ref 135–145)

## 2024-01-11 LAB — GLUCOSE, CAPILLARY
Glucose-Capillary: 111 mg/dL — ABNORMAL HIGH (ref 70–99)
Glucose-Capillary: 95 mg/dL (ref 70–99)
Glucose-Capillary: 114 mg/dL — ABNORMAL HIGH (ref 70–99)
Glucose-Capillary: 147 mg/dL — ABNORMAL HIGH (ref 70–99)

## 2024-01-11 LAB — MAGNESIUM: Magnesium: 2.1 mg/dL (ref 1.7–2.4)

## 2024-01-11 MED ORDER — TRAVASOL 10 % IV SOLN
INTRAVENOUS | Status: AC
  Filled 2024-01-11: qty 990

## 2024-01-11 NOTE — Progress Notes (Signed)
1930- unclamped G tube. 2130- 200 mL of bile drainage. G tube clamped. 0120- unclamped G tube. No N/V.  0325- 50 mL of bile drainage. G tube clamped.

## 2024-01-11 NOTE — Progress Notes (Signed)
PHARMACY - TOTAL PARENTERAL NUTRITION CONSULT NOTE  Indication:  delayed gastric emptying  Patient Measurements: Height: 4\' 10"  (147.3 cm) Weight: 51.1 kg (112 lb 9.6 oz) IBW/kg (Calculated) : 40.9 TPN AdjBW (KG): 44.5 Body mass index is 23.53 kg/m. 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  Patient was started on a CLD on 12/6-12/9 and then advanced to a soft diet since 12/10.  Patient has had minimal PO intake since being post-op and continues to experience nausea. Delayed gastric emptying likely related to possible pancreatic leak.  Pharmacy consulted to dose TPN.  Glucose / Insulin: A1c 3.7 (11/15/23) - gluc 110s (off TPN), 111-152 (on TPN), 2 units SSI since cycling  Electrolytes: Na 135, Corr Ca 9.4, others wnl Renal: SCr < 1, BUN wnl Hepatic: LFTs wnl, albumin 2.3, TG 59 Intake / Output; MIVF: PEG tube drain 1075 mL - may trial clamping tube later this week, LBM 1/20. Net I/O not accurate given unmeasured UOP.  GI Imaging: 12/7 XR: unremarkable bowel gas pattern. 12/11 CT: possible post-op seroma or hematoma vs abscess, distal colonic diverticulosis 12/15 XR: expected post-op changes, NGT in correct place 12/26 CT: perc drain cath adj left lobe liver, no discernable residual fluid collection. Post whipple w/ no evidence pancreatic ductal dilatation or peripancreatic fluid collection, small amount of free fluid in pelvis 1/2 CT - minimal fluid near drain in abdomen  1/11, 1/13 confirmed enteric tube placement  GI Surgeries / Procedures:  12/3 diagnostic laparoscopy, Whipple procedure, pancreatic duct stent 12/13 subhepatic fluid collection aspiration and drainage 1/3 IR US drain placement of abdominal abscess 1/15- IR placement gastrostomy tube   Central access: PICC replaced  12/27/23 TPN start date: 12/02/23  Nutritional Goals: RD Estimated Needs Total Energy Estimated Needs: 1700-2000 kcal Total Protein Estimated Needs: 85-100 gm Total Fluid Estimated Needs: 1 ml/kcal  Current Nutrition:  TPN  Plan:  Cyclic TPN - change to 14 hour cycle (69 ml/hr x 1hr then 138 ml/hr x 12 hrs, then 69 ml/hr x 1hr). With increasing wt, GIR remains < 7 mg//kg/min at this rate.  Cyclic TPN provides 1732 kCal and 99g AA, meeting 100% of nutritional needs.  Electrolytes in TPN: Na 13mEq/L, K 55mEq/L, Ca 3 mEq/L, Mg 71mEq/L, Phos 73mmol/L, change Cl:Ac 1:1.  MVI and trace added to TPN Monitor TPN labs on Mon/Thurs and prn  Continue sensitive sliding scale and CBGs 1 hr after TPN starts, one at peak, one at 1 hr after TPN stops and 1 while off.  F/u resolution of delayed gastric emptying and use of G-tube for nutritional needs (surgery does not anticipate this happening soon)  Christoper Fabian, PharmD, BCPS Please see amion for complete clinical pharmacist phone list 01/11/2024 9:04 AM

## 2024-01-11 NOTE — Progress Notes (Addendum)
1535- G tube clamped per order 1830- pt denies n/v

## 2024-01-11 NOTE — Progress Notes (Signed)
49 Days Post-Op   Subjective/Chief Complaint: No acute events.   Objective: Vital signs in last 24 hours: Temp:  [97.7 F (36.5 C)-98.7 F (37.1 C)] 97.9 F (36.6 C) (01/21 0759) Pulse Rate:  [79-85] 84 (01/21 0422) Resp:  [15] 15 (01/21 0422) BP: (109-149)/(48-88) 149/88 (01/21 0759) SpO2:  [96 %-98 %] 97 % (01/21 0422) Weight:  [51.1 kg] 51.1 kg (01/21 0422) Last BM Date : 01/10/24  Intake/Output from previous day: 01/20 0701 - 01/21 0700 In: 1909.7 [P.O.:177; I.V.:1432.7; IV Piggyback:300] Out: 1125 [Drains:1125] Intake/Output this shift: No intake/output data recorded.  Physical Exam:  General appearance: OOB in chair.  Conversant. Much more energetic.  Resp: breathing comfortably GI: soft, non distended. No rigidity or guarding.  Surgical drain output with seropurulent scant drainage in tube. G tube in place with bilious drainage.  Extremities: tr LE edema  Lab Results:  No results for input(s): "WBC", "HGB", "HCT", "PLT" in the last 72 hours.   BMET Recent Labs    01/10/24 0433 01/11/24 0710  NA 133* 135  K 4.3 4.5  CL 103 104  CO2 24 24  GLUCOSE 144* 144*  BUN 20 25*  CREATININE 0.41* 0.36*  CALCIUM 7.9* 7.8*   PT/INR No results for input(s): "LABPROT", "INR" in the last 72 hours.    ABG No results for input(s): "PHART", "HCO3" in the last 72 hours.  Invalid input(s): "PCO2", "PO2"   Studies/Results:  Anti-infectives: Anti-infectives (From admission, onward)    Start     Dose/Rate Route Frequency Ordered Stop   12/29/23 1630  cefTRIAXone (ROCEPHIN) 2 g in sodium chloride 0.9 % 100 mL IVPB       Note to Pharmacy: Pharmacy may adjust dosing strength / duration / interval for maximal efficacy   2 g 200 mL/hr over 30 Minutes Intravenous Every 24 hours 12/29/23 1536     12/29/23 1630  metroNIDAZOLE (FLAGYL) IVPB 500 mg        500 mg 100 mL/hr over 60 Minutes Intravenous Every 12 hours 12/29/23 1536     12/24/23 1000  piperacillin-tazobactam  (ZOSYN) IVPB 3.375 g  Status:  Discontinued        3.375 g 12.5 mL/hr over 240 Minutes Intravenous Every 8 hours 12/24/23 0913 12/29/23 1536   12/13/23 1700  piperacillin-tazobactam (ZOSYN) IVPB 3.375 g        3.375 g 12.5 mL/hr over 240 Minutes Intravenous Every 8 hours 12/13/23 1412 12/13/23 2139   11/30/23 1230  fluconazole (DIFLUCAN) IVPB 400 mg  Status:  Discontinued        400 mg 100 mL/hr over 120 Minutes Intravenous Every 24 hours 11/30/23 1135 12/28/23 1321   11/26/23 0945  piperacillin-tazobactam (ZOSYN) IVPB 3.375 g  Status:  Discontinued        3.375 g 12.5 mL/hr over 240 Minutes Intravenous Every 8 hours 11/26/23 0853 12/13/23 1412   11/23/23 2100  ceFAZolin (ANCEF) IVPB 2g/100 mL premix        2 g 200 mL/hr over 30 Minutes Intravenous Every 8 hours 11/23/23 1654 11/23/23 2214   11/23/23 0700  ceFAZolin (ANCEF) IVPB 2g/100 mL premix        2 g 200 mL/hr over 30 Minutes Intravenous On call to O.R. 11/23/23 0654 11/23/23 1249       Assessment/Plan: Ms. Sailors is an 88 year old female status post Whipple Procedure on 11/23/2023, (POD 49).  Final pathology - adenocarcinoma duodenum, positive margin, positive LN - [id number]  Abdominal fluid collection -  CT aspiration and drain placement 12/13 (anterior hepatic fluid collection) - serosang. Rare candida seen on final culture. Completed Zosyn. Fluc stopped yesterday as repeat cx negative for candida. Repeat CT 1/2 with abdominal wall collection now s/p IR aspiration 1/3 and bedside I&D 1/4.  Zosyn restarted 1/3 - stopped 1/8 and switched to ceftriaxone/flagyl based on cx of citrobacter and b frag.  Plan 14 days, stop in 2 days.  Pancreatic leak - some leakage via abd wound. R drain removed 12/31/23.  Continue left sided drain and dressing changes.  Left drain might be able to be removed in next 3-5 days if output remains scant.  Leak amount appears to be diminishing but output remains somewhat purulent appearing.   Delayed gastric  emptying. Qtc ok.  Continue reglan. TPN. Failed clamping trial again 12/27/22. G tube clamping trials today.  HOCM - Continue beta blockade and treat hypertension. IV metoprolol q4h per Cardiology.   Anemia of chronic disease, chronic blood loss anemia, and acute blood loss anemia- S/p 1U PRBC 12/27.  Hyperglycemia - improving, monitor   Hypophosphatemia - resolved Hypokalemia - resolved. Pharmacy monitoring.  Severe protein calorie malnutrition - TNA.  RUE DVT - treatment dose lovenox.  New picc placed opposite side.   OOB, IS, PT consult.  Thrush - nystatin suspension  FEN - TPN. Cont PPI BID & Carafate via tube. Marland Kitchen   VTE - lovenox treatment dose - cont. Right sided picc out.   ID - Fluconazole- stopped 1/7, resumed Zosyn 1/3 >> afebrile with normal WBCs.  Switched to ceftriaxone and flagyl 1/8. Dr Donell Beers to decide length of duration  Dispo:clamping trials today of g tube.  Panc leak appears nearly resolved. D/c second jp.    LOS: 49 days    01/11/2024, 8:44 AM   Almond Lint MD FACS [phone] for weekday/non holidays Check [email or web address] for coverage night/weekend/holidays

## 2024-01-11 NOTE — Plan of Care (Signed)
  Problem: Education: Goal: Knowledge of General Education information will improve Description: Including pain rating scale, medication(s)/side effects and non-pharmacologic comfort measures Outcome: Progressing   Problem: Clinical Measurements: Goal: Cardiovascular complication will be avoided Outcome: Progressing   Problem: Activity: Goal: Risk for activity intolerance will decrease Outcome: Progressing   Problem: Coping: Goal: Level of anxiety will decrease Outcome: Progressing   Problem: Pain Management: Goal: General experience of comfort will improve Outcome: Progressing   Problem: Safety: Goal: Ability to remain free from injury will improve Outcome: Progressing

## 2024-01-11 NOTE — Progress Notes (Signed)
Physical Therapy Treatment Patient Details Name: Christine Morse MRN: [id number] DOB: [birth year] Today's Date: 01/11/2024   History of Present Illness Patient is an 88 y/o female admitted 11/23/23 with diagnosis of duodenal adenoma and underwent laparoscopic Whipple procedure. post-op subcapsular fluid collection around the anterior aspect of the left liver lobe. Drain placement in IR 12/03/23. NGT inserted 12/15, dislodged then replaced.  Positive for R subclavian and axillary DVT on 12/07/23. 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    PT Comments  Pt received in recliner. Drain clamped and pt monitored for nausea which she did not experience with mobility.  Worked on fwd gaze and increasing step length during gait. Pt noted to have B heels off floor, R>L, with initial walking. When mentioned pt was able to push heels to floor but step length maintained was very small. After ambulation examined further and pt notes R hip discomfort as well as tightness B hamstrings. Spoke to daughter about shoes for ambulation next time and small lift to heels. Expect this will make gait more comfortable for pt. Patient will benefit from continued inpatient follow up therapy, <3 hours/day. PT will continue to follow.    If plan is discharge home, recommend the following: A little help with walking and/or transfers;A little help with bathing/dressing/bathroom;Assistance with cooking/housework;Assist for transportation   Can travel by private vehicle     Yes  Equipment Recommendations  Rolling walker (2 wheels);BSC/3in1    Recommendations for Other Services       Precautions / Restrictions Precautions Precautions: Fall Precaution Comments: LLQ Blake Drain, clamped today. Pt denied nausea throughout Restrictions Weight Bearing Restrictions Per Provider Order: No     Mobility  Bed Mobility               General bed mobility comments: in the chair on arrival     Transfers Overall transfer level: Needs assistance Equipment used: Rolling walker (2 wheels) Transfers: Sit to/from Stand Sit to Stand: Contact guard assist           General transfer comment: CGA for multiple sit>stand from recliner. Pt remembered to place hands on chair    Ambulation/Gait Ambulation/Gait assistance: Min assist Gait Distance (Feet): 100 Feet Assistive device: Rolling walker (2 wheels) Gait Pattern/deviations: Step-through pattern, Decreased step length - right, Decreased step length - left, Decreased stride length, Decreased dorsiflexion - right, Decreased dorsiflexion - left (R heel lifted initially) Gait velocity: decr Gait velocity interpretation: <1.31 ft/sec, indicative of household ambulator   General Gait Details: short low amplitude steps, flexed posture, initially B heels off floor R>L. When pointed this out to pt she was able to correct. When assessed late seems that R hip tightness is leading to this pattern as compensation for pain. Discussed wearing shoes with pt's daughter as this will likely help hip discomfort   Stairs             Wheelchair Mobility     Tilt Bed    Modified Rankin (Stroke Patients Only)       Balance Overall balance assessment: Needs assistance Sitting-balance support: Feet supported Sitting balance-[name]: Fair Sitting balance - Comments: kyphosis in all positions.   Standing balance support: Reliant on assistive device for balance Standing balance-[name]: Poor Standing balance comment: reliant on UE support                            Cognition Arousal:  Alert Behavior During Therapy: [facility] for tasks assessed/performed Overall Cognitive Status: Within Functional Limits for tasks assessed (for basic mobility and conversation)                                          Exercises      General Comments General comments (skin integrity, edema, etc.): VSS on RA. Stretch  to B hamstrings and df in sitting after session. 30 secs ea      Pertinent Vitals/Pain Pain Assessment Pain Assessment: Faces Faces Pain Scale: Hurts a little bit Pain Location: R hip Pain Descriptors / Indicators: Discomfort Pain Intervention(s): Monitored during session    Home Living                          Prior Function            PT Goals (current goals can now be found in the care plan section) Acute Rehab PT Goals Patient Stated Goal: return to independent PT Goal Formulation: With patient Time For Goal Achievement: 01/17/24 Potential to Achieve Goals: Fair Progress towards PT goals: Progressing toward goals    Frequency    Min 1X/week      PT Plan      Co-evaluation              AM-PAC PT "6 Clicks" Mobility   Outcome Measure  Help needed turning from your back to your side while in a flat bed without using bedrails?: A Little Help needed moving from lying on your back to sitting on the side of a flat bed without using bedrails?: A Little Help needed moving to and from a bed to a chair (including a wheelchair)?: A Little Help needed standing up from a chair using your arms (e.g., wheelchair or bedside chair)?: A Little Help needed to walk in hospital room?: A Little Help needed climbing 3-5 steps with a railing? : A Lot 6 Click Score: 17    End of Session Equipment Utilized During Treatment: Gait belt Activity Tolerance: Patient tolerated treatment well;Patient limited by fatigue;No increased pain Patient left: in chair;with call bell/phone within reach;with chair alarm set;with family/visitor present Nurse Communication: Mobility status PT Visit Diagnosis: Other abnormalities of gait and mobility (R26.89);Muscle weakness (generalized) (M62.81) Pain - Right/Left: Right Pain - part of body: Hip     Time: 9147-8295 PT Time Calculation (min) (ACUTE ONLY): 19 min  Charges:    $Gait Training: 8-22 mins PT General Charges $$ ACUTE  PT VISIT: 1 Visit                     Lyanne Co, PT  Acute Rehab Services Secure chat preferred Office [phone]    Lawana Chambers Maryse Brierley 01/11/2024, 1:48 PM

## 2024-01-11 NOTE — Progress Notes (Signed)
LLQ straight drain removed per order with no complications.

## 2024-01-11 NOTE — Progress Notes (Addendum)
0930- G tube clamped per order 1335- G tube unclamped and open to gravity.  1530- of bile colored drainage.  Pt denies n/v

## 2024-01-12 LAB — GLUCOSE, CAPILLARY
Glucose-Capillary: 128 mg/dL — ABNORMAL HIGH (ref 70–99)
Glucose-Capillary: 116 mg/dL — ABNORMAL HIGH (ref 70–99)
Glucose-Capillary: 112 mg/dL — ABNORMAL HIGH (ref 70–99)
Glucose-Capillary: 145 mg/dL — ABNORMAL HIGH (ref 70–99)

## 2024-01-12 MED ORDER — TRAVASOL 10 % IV SOLN
INTRAVENOUS | Status: AC
  Filled 2024-01-12: qty 990

## 2024-01-12 NOTE — Progress Notes (Signed)
1000- G tube clamped 1700- G tube unclamped.  1800- of bile drainage. Pt denies n/v.  1805- G tube clamped.

## 2024-01-12 NOTE — Progress Notes (Addendum)
0730- G tube unclampled 0930- of bile drainage. Pt denies n/v

## 2024-01-12 NOTE — Progress Notes (Signed)
PHARMACY - TOTAL PARENTERAL NUTRITION CONSULT NOTE  Indication:  delayed gastric emptying  Patient Measurements: Height: 4\' 10"  (147.3 cm) Weight: 51.1 kg (112 lb 9.6 oz) IBW/kg (Calculated) : 40.9 TPN AdjBW (KG): 44.5 Body mass index is 23.53 kg/m. 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  Patient was started on a CLD on 12/6-12/9 and then advanced to a soft diet since 12/10.  Patient has had minimal PO intake since being post-op and continues to experience nausea. Delayed gastric emptying likely related to possible pancreatic leak.  Pharmacy consulted to dose TPN.  Glucose / Insulin: A1c 3.7 (11/15/23) - gluc 95-114 (off TPN), 128-147 (on TPN), 2 units SSI since cycling  Electrolytes: 1/21 Corr Ca 9.4, others wnl Renal: SCr < 1, BUN 20s Hepatic: LFTs wnl, albumin 2.3, TG 59 Intake / Output; MIVF: PEG tube drain 1300 mL - trial clamping tube 1/21, LBM 1/20. Net I/O not accurate given unmeasured UOP.  GI Imaging: 12/7 XR: unremarkable bowel gas pattern. 12/11 CT: possible post-op seroma or hematoma vs abscess, distal colonic diverticulosis 12/15 XR: expected post-op changes, NGT in correct place 12/26 CT: perc drain cath adj left lobe liver, no discernable residual fluid collection. Post whipple w/ no evidence pancreatic ductal dilatation or peripancreatic fluid collection, small amount of free fluid in pelvis 1/2 CT - minimal fluid near drain in abdomen  1/11, 1/13 confirmed enteric tube placement  GI Surgeries / Procedures:  12/3 diagnostic laparoscopy, Whipple procedure, pancreatic duct stent 12/13 subhepatic fluid collection aspiration and drainage 1/3 IR US drain placement of abdominal abscess 1/15- IR placement gastrostomy tube   Central access: PICC replaced 12/27/23 TPN  start date: 12/02/23  Nutritional Goals: RD Estimated Needs Total Energy Estimated Needs: 1700-2000 kcal Total Protein Estimated Needs: 85-100 gm Total Fluid Estimated Needs: 1 ml/kcal  Current Nutrition:  TPN  Plan:  Cyclic TPN - continue 14 hour cycle (69 ml/hr x 1hr then 138 ml/hr x 12 hrs, then 69 ml/hr x 1hr). With increasing wt, GIR remains < 7 mg//kg/min at this rate.  Cyclic TPN provides 1732 kCal and 99g AA, meeting 100% of nutritional needs.  Electrolytes in TPN: Na 185mEq/L, K 25mEq/L, Ca 3 mEq/L, Mg 39mEq/L, Phos 80mmol/L, change Cl:Ac 1:1.  MVI and trace added to TPN Monitor TPN labs on Mon/Thurs and prn  Continue sensitive sliding scale and CBGs 1 hr after TPN starts, one at peak, one at 1 hr after TPN stops and 1 while off.  F/u resolution of delayed gastric emptying and use of G-tube for nutritional needs (surgery does not anticipate this happening soon)  Christoper Fabian, PharmD, BCPS Please see amion for complete clinical pharmacist phone list 01/12/2024 8:41 AM

## 2024-01-12 NOTE — Plan of Care (Signed)
  Problem: Clinical Measurements: Goal: Cardiovascular complication will be avoided Outcome: Progressing   Problem: Activity: Goal: Risk for activity intolerance will decrease Outcome: Progressing   Problem: Coping: Goal: Level of anxiety will decrease Outcome: Progressing   Problem: Elimination: Goal: Will not experience complications related to bowel motility Outcome: Progressing   Problem: Pain Management: Goal: General experience of comfort will improve Outcome: Progressing   Problem: Safety: Goal: Ability to remain free from injury will improve Outcome: Progressing   Problem: Metabolic: Goal: Ability to maintain appropriate glucose levels will improve Outcome: Progressing

## 2024-01-12 NOTE — Progress Notes (Signed)
50 Days Post-Op   Subjective/Chief Complaint: Patient denies pain.  Last drain pulled yesterday.  Less drainage via midline wound.    Objective: Vital signs in last 24 hours: Temp:  [97.6 F (36.4 C)-98.7 F (37.1 C)] 98.5 F (36.9 C) (01/22 0748) Pulse Rate:  [82] 82 (01/21 1513) Resp:  [20] 20 (01/21 1513) BP: (136-160)/(59-76) 147/75 (01/22 0748) SpO2:  [93 %-98 %] 94 % (01/22 0748) Last BM Date : 01/11/23  Intake/Output from previous day: 01/21 0701 - 01/22 0700 In: 221.7 [I.V.:21.7; IV Piggyback:200] Out: 950 [Drains:950] Intake/Output this shift: Total I/O In: -  Out: 350 [Drains:350]  Physical Exam:  General appearance: OOB in chair.  Continues to look stronger.  Resp: breathing comfortably GI: soft, non distended. No rigidity or guarding. No visible drainage from drain sites. G tube in place with bilious drainage.  Extremities: tr LE edema  Lab Results:  No results for input(s): "WBC", "HGB", "HCT", "PLT" in the last 72 hours.   BMET Recent Labs    01/10/24 0433 01/11/24 0710  NA 133* 135  K 4.3 4.5  CL 103 104  CO2 24 24  GLUCOSE 144* 144*  BUN 20 25*  CREATININE 0.41* 0.36*  CALCIUM 7.9* 7.8*   PT/INR No results for input(s): "LABPROT", "INR" in the last 72 hours.    ABG No results for input(s): "PHART", "HCO3" in the last 72 hours.  Invalid input(s): "PCO2", "PO2"   Studies/Results:  Anti-infectives: Anti-infectives (From admission, onward)    Start     Dose/Rate Route Frequency Ordered Stop   12/29/23 1630  cefTRIAXone (ROCEPHIN) 2 g in sodium chloride 0.9 % 100 mL IVPB       Note to Pharmacy: Pharmacy may adjust dosing strength / duration / interval for maximal efficacy   2 g 200 mL/hr over 30 Minutes Intravenous Every 24 hours 12/29/23 1536     12/29/23 1630  metroNIDAZOLE (FLAGYL) IVPB 500 mg        500 mg 100 mL/hr over 60 Minutes Intravenous Every 12 hours 12/29/23 1536     12/24/23 1000  piperacillin-tazobactam (ZOSYN) IVPB  3.375 g  Status:  Discontinued        3.375 g 12.5 mL/hr over 240 Minutes Intravenous Every 8 hours 12/24/23 0913 12/29/23 1536   12/13/23 1700  piperacillin-tazobactam (ZOSYN) IVPB 3.375 g        3.375 g 12.5 mL/hr over 240 Minutes Intravenous Every 8 hours 12/13/23 1412 12/13/23 2139   11/30/23 1230  fluconazole (DIFLUCAN) IVPB 400 mg  Status:  Discontinued        400 mg 100 mL/hr over 120 Minutes Intravenous Every 24 hours 11/30/23 1135 12/28/23 1321   11/26/23 0945  piperacillin-tazobactam (ZOSYN) IVPB 3.375 g  Status:  Discontinued        3.375 g 12.5 mL/hr over 240 Minutes Intravenous Every 8 hours 11/26/23 0853 12/13/23 1412   11/23/23 2100  ceFAZolin (ANCEF) IVPB 2g/100 mL premix        2 g 200 mL/hr over 30 Minutes Intravenous Every 8 hours 11/23/23 1654 11/23/23 2214   11/23/23 0700  ceFAZolin (ANCEF) IVPB 2g/100 mL premix        2 g 200 mL/hr over 30 Minutes Intravenous On call to O.R. 11/23/23 0654 11/23/23 1249       Assessment/Plan: Christine Morse is an 88 year old female status post Whipple Procedure on 11/23/2023, (POD 50).  Final pathology - adenocarcinoma duodenum, positive margin, positive LN - pT3pN2cM0  Abdominal  fluid collection - CT aspiration and drain placement 12/13 (anterior hepatic fluid collection) - serosang. Rare candida seen on final culture. Completed Zosyn. Fluc stopped yesterday as repeat cx negative for candida. Repeat CT 1/2 with abdominal wall collection now s/p IR aspiration 1/3 and bedside I&D 1/4.  Zosyn restarted 1/3 - stopped 1/8 and switched to ceftriaxone/flagyl based on cx of citrobacter and b frag.  Plan 14 days, stop after last dose today.  Pancreatic leak - some leakage via abd wound. R drain removed 12/31/23.  All drains out now. Still with small amount of drainage in midline wound.  Delayed gastric emptying. Qtc ok.  Continue reglan. TPN. G tube placed.  Tolerated g tube clamping 4 hours on and 2 hours off.  Try 7 hours clamped and 1 hour to  gravity today.  Hopefully this will be improved now that leak is resolving.  HOCM - Continue beta blockade and treat hypertension. IV metoprolol q4h per Cardiology.   Anemia of chronic disease, chronic blood loss anemia, and acute blood loss anemia- S/p 1U PRBC 12/27.  Hyperglycemia - improving, monitor. 1-5 units per day.  Hypophosphatemia - resolved Hypokalemia - resolved. Pharmacy monitoring.  Severe protein calorie malnutrition - TNA.  RUE DVT - treatment dose lovenox.  New picc placed opposite side.   OOB, IS, PT consult.  Thrush - nystatin suspension  FEN - TPN. Cont PPI BID & Carafate via tube. G tube clamping trials.    VTE - lovenox treatment dose - cont. Right sided picc out.   ID - Fluconazole- stopped 1/7, resumed Zosyn 1/3 >> afebrile with normal WBCs.  Switched to ceftriaxone and flagyl 1/8. Stop antibiotics after last dose today.   Dispo:clamping trials today of g tube. Hopefully we can clamp g tube tomorrow completely and see if she tolerates clears without n/v with tube clamped.   Panc leak appears nearly resolved.    LOS: 50 days    01/12/2024, 10:51 AM   Almond Lint MD FACS [phone] for weekday/non holidays Check [email or web address] for coverage night/weekend/holidays

## 2024-01-13 LAB — COMPREHENSIVE METABOLIC PANEL
ALT: 19 U/L (ref 0–44)
AST: 21 U/L (ref 15–41)
Albumin: 2.3 g/dL — ABNORMAL LOW (ref 3.5–5.0)
Alkaline Phosphatase: 69 U/L (ref 38–126)
Anion gap: 7 (ref 5–15)
BUN: 23 mg/dL (ref 8–23)
CO2: 26 mmol/L (ref 22–32)
Calcium: 8.1 mg/dL — ABNORMAL LOW (ref 8.9–10.3)
Chloride: 109 mmol/L (ref 98–111)
Creatinine, Ser: 0.35 mg/dL — ABNORMAL LOW (ref 0.44–1.00)
GFR, Estimated: >60 mL/min (ref >60)
Glucose, Bld: 174 mg/dL — ABNORMAL HIGH (ref 70–99)
Potassium: 4 mmol/L (ref 3.5–5.1)
Sodium: 142 mmol/L (ref 135–145)
Total Bilirubin: 0.3 mg/dL (ref 0.0–1.2)
Total Protein: 5.4 g/dL — ABNORMAL LOW (ref 6.5–8.1)

## 2024-01-13 LAB — GLUCOSE, CAPILLARY
Glucose-Capillary: 145 mg/dL — ABNORMAL HIGH (ref 70–99)
Glucose-Capillary: 125 mg/dL — ABNORMAL HIGH (ref 70–99)
Glucose-Capillary: 124 mg/dL — ABNORMAL HIGH (ref 70–99)
Glucose-Capillary: 138 mg/dL — ABNORMAL HIGH (ref 70–99)

## 2024-01-13 LAB — MAGNESIUM: Magnesium: 1.9 mg/dL (ref 1.7–2.4)

## 2024-01-13 LAB — PHOSPHORUS: Phosphorus: 3.2 mg/dL (ref 2.5–4.6)

## 2024-01-13 MED ORDER — TRAVASOL 10 % IV SOLN
INTRAVENOUS | Status: AC
  Filled 2024-01-13: qty 990

## 2024-01-13 MED ORDER — BOOST / RESOURCE BREEZE PO LIQD CUSTOM
1.0000 | Freq: Two times a day (BID) | ORAL | Status: DC
  Administered 2024-01-13 – 2024-01-14 (×3): 1 via ORAL

## 2024-01-13 MED ORDER — SODIUM CHLORIDE 0.9 % IV SOLN: 2.0000 g | INTRAVENOUS | Status: DC

## 2024-01-13 MED ORDER — METRONIDAZOLE 500 MG/100ML IV SOLN
500.0000 mg | Freq: Two times a day (BID) | INTRAVENOUS | Status: DC
  Filled 2024-01-13: qty 100

## 2024-01-13 NOTE — Progress Notes (Signed)
PHARMACY - TOTAL PARENTERAL NUTRITION CONSULT NOTE  Indication:  delayed gastric emptying  Patient Measurements: Height: 4\' 10"  (147.3 cm) Weight: 51.1 kg (112 lb 9.6 oz) IBW/kg (Calculated) : 40.9 TPN AdjBW (KG): 44.5 Body mass index is 23.53 kg/m. 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  Patient was started on a CLD on 12/6-12/9 and then advanced to a soft diet since 12/10.  Patient has had minimal PO intake since being post-op and continues to experience nausea. Delayed gastric emptying likely related to possible pancreatic leak.  Pharmacy consulted to dose TPN.  Glucose / Insulin: A1c 3.7 (11/15/23) - gluc 112-116 (off TPN), 125-145 (on TPN), 2 units SSI since cycling  Electrolytes: 1/23 Corr Ca 9.5, others wnl Renal: SCr < 1, BUN 20s Hepatic: LFTs wnl, albumin 2.3, TG 59 Intake / Output; MIVF: PEG tube drain 1300 mL - trial clamping tube 1/21-22, LBM 1/20. Net I/O not accurate given unmeasured UOP.  GI Imaging: 12/7 XR: unremarkable bowel gas pattern. 12/11 CT: possible post-op seroma or hematoma vs abscess, distal colonic diverticulosis 12/15 XR: expected post-op changes, NGT in correct place 12/26 CT: perc drain cath adj left lobe liver, no discernable residual fluid collection. Post whipple w/ no evidence pancreatic ductal dilatation or peripancreatic fluid collection, small amount of free fluid in pelvis 1/2 CT - minimal fluid near drain in abdomen  1/11, 1/13 confirmed enteric tube placement  GI Surgeries / Procedures:  12/3 diagnostic laparoscopy, Whipple procedure, pancreatic duct stent 12/13 subhepatic fluid collection aspiration and drainage 1/3 IR US drain placement of abdominal abscess 1/15- IR placement gastrostomy tube   Central access: PICC replaced 12/27/23 TPN  start date: 12/02/23  Nutritional Goals: RD Estimated Needs Total Energy Estimated Needs: 1700-2000 kcal Total Protein Estimated Needs: 85-100 gm Total Fluid Estimated Needs: 1 ml/kcal  Current Nutrition:  TPN CLD  Plan:  Cyclic TPN - continue 14 hour cycle (69 ml/hr x 1hr then 138 ml/hr x 12 hrs, then 69 ml/hr x 1hr). With increasing wt, GIR remains < 7 mg/kg/min at this rate.  Cyclic TPN provides 1732 kCal and 99g AA, meeting 100% of nutritional needs.  Electrolytes in TPN: Na 167mEq/L, K 32mEq/L, Ca 3 mEq/L, Mg 63mEq/L, Phos 101mmol/L, change Cl:Ac 1:1.  MVI and trace added to TPN Monitor TPN labs on Mon/Thurs and prn  Continue sensitive sliding scale and CBGs 1 hr after TPN starts, one at peak, one at 1 hr after TPN stops and 1 while off.  F/u resolution of delayed gastric emptying and use of G-tube for nutritional needs (surgery does not anticipate this happening soon)  Thank you for involving pharmacy in this patient's care.  Loura Back, PharmD, BCPS Clinical Pharmacist Clinical phone for 01/13/2024 is [phone] 01/13/2024 7:18 AM

## 2024-01-13 NOTE — TOC CM/SW Note (Signed)
Reached out to Dr. Donell Beers with call made to office and msg left with nurse to request return call. Would like to discuss LTACH option with Dr. Donell Beers and dispo plans. Msg also included that if Dr. Donell Beers agreeable to [facility] referral she could place [facility] order for request. CM awaits word from provider prior to proceeding with any LTACH referrals.

## 2024-01-13 NOTE — Progress Notes (Signed)
Physical Therapy Treatment Patient Details Name: Christine Morse MRN: [id number] DOB: Jul 08, 1936 Today's Date: 01/13/2024   History of Present Illness Patient is an 88 y/o female admitted 11/23/23 with diagnosis of duodenal adenoma and underwent laparoscopic Whipple procedure. post-op subcapsular fluid collection around the anterior aspect of the left liver lobe. Drain placement in IR 12/03/23. NGT inserted 12/15, dislodged then replaced.  Positive for R subclavian and axillary DVT on 12/07/23. 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    PT Comments  Pt in good spirits and making small gains daily.  Emphasis on gait stability/speed and endurance.     If plan is discharge home, recommend the following: A little help with walking and/or transfers;A little help with bathing/dressing/bathroom;Assistance with cooking/housework;Assist for transportation   Can travel by private vehicle     Yes  Equipment Recommendations  Rolling walker (2 wheels);BSC/3in1    Recommendations for Other Services       Precautions / Restrictions Precautions Precautions: Fall Precaution Comments: drains removed     Mobility  Bed Mobility               General bed mobility comments: in the chair on arrival    Transfers Overall transfer level: Needs assistance Equipment used: Rolling walker (2 wheels) Transfers: Sit to/from Stand Sit to Stand: Min assist           General transfer comment: cues for hand placement and some forward assist, not so much boost    Ambulation/Gait Ambulation/Gait assistance: Min assist, Contact guard assist   Assistive device: Rolling walker (2 wheels) Gait Pattern/deviations: Step-through pattern, Decreased step length - right, Decreased step length - left, Decreased stride length Gait velocity: decr     General Gait Details: short low amplitude steps, flexed posture, looks much too often at the floor, but ambulated with the  RW generally well.  Several misteps with the R foot.  Pt kept  saying "it;'s the R foot.   Stairs             Wheelchair Mobility     Tilt Bed    Modified Rankin (Stroke Patients Only)       Balance     Sitting balance-[name]: Fair       Standing balance-[name]: Poor Standing balance comment: reliant on UE support                            Cognition Arousal: Alert Behavior During Therapy: WFL for tasks assessed/performed Overall Cognitive Status:  (NT formally)                                          Exercises      General Comments        Pertinent Vitals/Pain Pain Assessment Pain Assessment: Faces Faces Pain Scale: Hurts a little bit Pain Location: abdomen R hip/foot Pain Descriptors / Indicators: Discomfort Pain Intervention(s): Monitored during session    Home Living                          Prior Function            PT Goals (current goals can now be found in the care plan section) Acute Rehab PT Goals PT Goal Formulation: With patient Time For Goal  Achievement: 01/17/24 Potential to Achieve Goals: Fair Progress towards PT goals: Progressing toward goals    Frequency    Min 1X/week      PT Plan      Co-evaluation              AM-PAC PT "6 Clicks" Mobility   Outcome Measure  Help needed turning from your back to your side while in a flat bed without using bedrails?: A Little Help needed moving from lying on your back to sitting on the side of a flat bed without using bedrails?: A Little Help needed moving to and from a bed to a chair (including a wheelchair)?: A Little Help needed standing up from a chair using your arms (e.g., wheelchair or bedside chair)?: A Little Help needed to walk in hospital room?: A Little Help needed climbing 3-5 steps with a railing? : A Lot 6 Click Score: 17    End of Session   Activity Tolerance: Patient tolerated treatment well;No  increased pain;Patient limited by fatigue Patient left: in chair;with call bell/phone within reach;with chair alarm set Nurse Communication: Mobility status PT Visit Diagnosis: Other abnormalities of gait and mobility (R26.89);Muscle weakness (generalized) (M62.81)     Time: 1710-1737 PT Time Calculation (min) (ACUTE ONLY): 27 min  Charges:    $Gait Training: 8-22 mins $Therapeutic Activity: 8-22 mins PT General Charges $$ ACUTE PT VISIT: 1 Visit                     01/13/2024  Jacinto Halim., PT Acute Rehabilitation Services [phone]  (office)   Eliseo Gum Radhika Dershem 01/13/2024, 7:19 PM

## 2024-01-13 NOTE — Progress Notes (Signed)
86 Days Post-Op   Subjective/Chief Complaint: Did ok without n/v with g tube clamped 7 hours/off 1 hour.    Objective: Vital signs in last 24 hours: Temp:  [97.7 F (36.5 C)-98.7 F (37.1 C)] 97.8 F (36.6 C) (01/23 1145) Pulse Rate:  [88-107] 101 (01/23 1145) Resp:  [20] 20 (01/23 1145) BP: (131-160)/(54-82) 160/82 (01/23 0106) SpO2:  [96 %-98 %] 98 % (01/22 2324) Last BM Date : 01/12/24  Intake/Output from previous day: 01/22 0701 - 01/23 0700 In: 349.6 [I.V.:49.6; IV Piggyback:300] Out: 850 [Drains:850] Intake/Output this shift: Total I/O In: -  Out: 200 [Drains:200]  Physical Exam:  General appearance: OOB in chair.  Continues to look stronger.  Resp: breathing comfortably GI: soft, non distended. No rigidity or guarding. No visible drainage from drain sites. G tube in place with bilious drainage.  Extremities: tr LE edema  Lab Results:  No results for input(s): "WBC", "HGB", "HCT", "PLT" in the last 72 hours.   BMET Recent Labs    01/11/24 0710 01/13/24 0518  NA 135 142  K 4.5 4.0  CL 104 109  CO2 24 26  GLUCOSE 144* 174*  BUN 25* 23  CREATININE 0.36* 0.35*  CALCIUM 7.8* 8.1*   PT/INR No results for input(s): "LABPROT", "INR" in the last 72 hours.    ABG No results for input(s): "PHART", "HCO3" in the last 72 hours.  Invalid input(s): "PCO2", "PO2"   Studies/Results:  Anti-infectives: Anti-infectives (From admission, onward)    Start     Dose/Rate Route Frequency Ordered Stop   01/13/24 1630  cefTRIAXone (ROCEPHIN) 2 g in sodium chloride 0.9 % 100 mL IVPB       Note to Pharmacy: Pharmacy may adjust dosing strength / duration / interval for maximal efficacy   2 g 200 mL/hr over 30 Minutes Intravenous Every 24 hours 01/13/24 0923 01/14/24 1629   01/13/24 1630  metroNIDAZOLE (FLAGYL) IVPB 500 mg        500 mg 100 mL/hr over 60 Minutes Intravenous Every 12 hours 01/13/24 0923 01/14/24 0429   12/29/23 1630  cefTRIAXone (ROCEPHIN) 2 g in sodium  chloride 0.9 % 100 mL IVPB  Status:  Discontinued       Note to Pharmacy: Pharmacy may adjust dosing strength / duration / interval for maximal efficacy   2 g 200 mL/hr over 30 Minutes Intravenous Every 24 hours 12/29/23 1536 01/13/24 0923   12/29/23 1630  metroNIDAZOLE (FLAGYL) IVPB 500 mg  Status:  Discontinued        500 mg 100 mL/hr over 60 Minutes Intravenous Every 12 hours 12/29/23 1536 01/13/24 0923   12/24/23 1000  piperacillin-tazobactam (ZOSYN) IVPB 3.375 g  Status:  Discontinued        3.375 g 12.5 mL/hr over 240 Minutes Intravenous Every 8 hours 12/24/23 0913 12/29/23 1536   12/13/23 1700  piperacillin-tazobactam (ZOSYN) IVPB 3.375 g        3.375 g 12.5 mL/hr over 240 Minutes Intravenous Every 8 hours 12/13/23 1412 12/13/23 2139   11/30/23 1230  fluconazole (DIFLUCAN) IVPB 400 mg  Status:  Discontinued        400 mg 100 mL/hr over 120 Minutes Intravenous Every 24 hours 11/30/23 1135 12/28/23 1321   11/26/23 0945  piperacillin-tazobactam (ZOSYN) IVPB 3.375 g  Status:  Discontinued        3.375 g 12.5 mL/hr over 240 Minutes Intravenous Every 8 hours 11/26/23 0853 12/13/23 1412   11/23/23 2100  ceFAZolin (ANCEF) IVPB 2g/100 mL premix  2 g 200 mL/hr over 30 Minutes Intravenous Every 8 hours 11/23/23 1654 11/23/23 2214   11/23/23 0700  ceFAZolin (ANCEF) IVPB 2g/100 mL premix        2 g 200 mL/hr over 30 Minutes Intravenous On call to O.R. 11/23/23 0654 11/23/23 1249       Assessment/Plan: Christine Morse is an 88 year old female status post Whipple Procedure on 11/23/2023, (POD 51).  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 Rare candida seen on final culture. Completed Zosyn. Fluc stopped yesterday as repeat cx negative for candida. Repeat CT 1/2 with abdominal wall collection now s/p IR aspiration 1/3 and bedside I&D  1/4.  Zosyn restarted 1/3 - stopped 1/8 and switched to ceftriaxone/flagyl based on cx of citrobacter and b frag.  Plan 14 days, stop after last dose today.  Pancreatic leak - some leakage via abd wound. R drain removed 12/31/23.  All drains out now.  Delayed gastric emptying. Qtc ok.  Continue reglan. TPN. G tube placed.  Tolerated g tube clamping 7 hours on and 1 hours off.  Try to keep g tube clamped now and do regular clear liquids.  HOCM - Continue beta blockade and treat hypertension. IV metoprolol q4h per Cardiology.   Anemia of chronic disease, chronic blood loss anemia, and acute blood loss anemia- S/p 1U PRBC 12/27.  Hyperglycemia - improving, monitor. 1-5 units per day.  Hypophosphatemia - resolved Hypokalemia - resolved. Pharmacy monitoring.  Severe protein calorie malnutrition - TNA.  RUE DVT - treatment dose lovenox.  New picc placed opposite side.   OOB, IS, PT consult.  Thrush - nystatin suspension  FEN - TPN. Cont PPI BID & Carafate via tube. G tube clamping. Try clear liq today.  Will take several days to know if she is tolerating oral diet.      VTE - lovenox treatment dose - cont. Right sided picc out.   ID - Fluconazole- stopped 1/7, resumed Zosyn 1/3 >> afebrile with normal WBCs.  Switched to ceftriaxone and flagyl 1/8. Stop antibiotics today.  Dispo: Panc leak appears nearly resolved. Delayed gastric emptying also hopefully resolving.     LOS: 51 days    01/13/2024, 1:08 PM   Almond Lint MD FACS [phone] for weekday/non holidays Check [email or web address] for coverage night/weekend/holidays

## 2024-01-13 NOTE — Progress Notes (Signed)
Nutrition Follow-up  DOCUMENTATION CODES:   Severe malnutrition in context of acute illness/injury  INTERVENTION:   - Diet advancement per MD  - TPN per pharmacy (Pharmacy to add MVI to TPN) - Daily weights  - Boost Breeze po BID, each supplement provides 250 kcal and 9 grams of protein  NUTRITION DIAGNOSIS:   Severe Malnutrition (in the context of acute illness) related to  (inadequate energy intake) as evidenced by energy intake < or equal to 50% for > or equal to 5 days, moderate fat depletion, moderate muscle depletion.  Still applicable   GOAL:   Patient will meet greater than or equal to 90% of their needs  Meeting via TPN   MONITOR:   PO intake, Supplement acceptance, Labs, Weight trends  REASON FOR ASSESSMENT:   Consult Assessment of nutrition requirement/status  ASSESSMENT:   Pt with hx of HTN, HLD, GERD, and diverticulosis presented for planned surgery after a large duodenal mass was seen on imaging at admission in November and was presumed to be malignant. Underwent pancreaticoduodenectomy and placement of pancreatic duct stent and confirmed tohave adenocarcinoma duodenum.  12/3 - Op, whipple procedure and placement of pancreatic duct stent.  12/10 - Soft diet  12/11 CT: possible post-op seroma or hematoma vs abscess, distal colonic diverticulosis  12/12 - TPN started,  12/13 - CT aspiration and drain placement, NPO 12/15 - NG inserted for persistent emesis  12/18- venous duplex positive for VTE; started on treatment dose lovenox 12/20- NGT clamping trials 12/21- NGT d/c, advanced to clear liquids 12/22- advanced to full liquids 12/23 - clear liquids 12/24 - NPO 12/25 NGT replaced 1/5-1/6 - Pt pulled PICC line, TPN stopped, replaced 1/6 1/10 - Pt pulled NGT, Right drain removed  1/13 - NGT replaced  1/15 - IR placement of PEG tube-for drainage  1/21 - Tolerated 4 hours G tube clamping 1/23 - Advanced to clears   Ongoing delayed gastric emptying and  resolving pancreatic leak. All drains out.  PEG placed 1/15, with ongoing clamping trials. G tube clamped for 4 hours yesterday and patient denied any N/V. G tube clamped today and diet advanced to clears, awaiting to see if patient tolerates.   TPN continuing, providing 1732 kcal and 99 gm protein, meeting 100% of nutritional needs.  Last BM 1/22. Weight was trending down during admission, increased energy needs on 1/3 and then again on 1/7. Weight is now stable.   Admit weight: 55.3 kg Current weight: 51.1 kg     Intake/Output Summary (Last 24 hours) at 01/13/2024 0836 Last data filed at 01/13/2024 0331 Gross per 24 hour  Intake 349.64 ml  Output 850 ml  Net -500.36 ml   Drains/Lines: PEG: 850 ml x 24 hours   Average Meal Intake: NPO  Nutritionally Relevant Medications: Scheduled Meds:  insulin aspart  0-9 Units Subcutaneous 4 times per day   metoCLOPramide (REGLAN) injection  5 mg Intravenous Q8H   metoprolol tartrate  7.5 mg Intravenous 6 X Daily   pantoprazole (PROTONIX) IV  40 mg Intravenous Q12H   sucralfate  1 g Per Tube TID   Continuous Infusions:  cefTRIAXone (ROCEPHIN)  IV Stopped (01/12/24 1558)   metronidazole 500 mg (01/13/24 0332)   Labs Reviewed: Creatinine 0.34, Calcium 8.1,  CBG ranges from 112-145 mg/dL over the last 24 hours HgbA1c 3.7 (10/2023)    Diet Order:   Diet Order     None       EDUCATION NEEDS:   Education needs have been  addressed  Skin:  Skin Assessment: Skin Integrity Issues: Skin Integrity Issues:: Incisions Incisions: Closed Abdomen  Last BM:  1/22 type 6  Height:   Ht Readings from Last 1 Encounters:  11/23/23 4\' 10"  (1.473 m)    Weight:   Wt Readings from Last 1 Encounters:  01/11/24 51.1 kg    Ideal Body Weight:  43.9 kg  BMI:  Body mass index is 23.53 kg/m.  Estimated Nutritional Needs:   Kcal:  1700-2000 kcal  Protein:  85-100 gm  Fluid:  1 ml/kcal   Elliot Dally, RD Registered Dietitian  See  Amion for more information

## 2024-01-13 NOTE — TOC CM/SW Note (Signed)
TOC- CM reached out to Dr. Donell Beers with call made to office to discuss dispo planning and possible LTACH referral. Spoke with nurse and msg left with request for return call from Dr. Donell Beers. CM would like to discuss dispo needs with provider and EDD. CM- Donn Pierini RN- can be reached at [phone]

## 2024-01-14 ENCOUNTER — Encounter: Payer: Self-pay | Admitting: Hematology

## 2024-01-14 ENCOUNTER — Other Ambulatory Visit (HOSPITAL_COMMUNITY): Payer: Self-pay

## 2024-01-14 LAB — GLUCOSE, CAPILLARY
Glucose-Capillary: 148 mg/dL — ABNORMAL HIGH (ref 70–99)
Glucose-Capillary: 147 mg/dL — ABNORMAL HIGH (ref 70–99)

## 2024-01-14 MED ORDER — METOPROLOL TARTRATE 50 MG PO TABS
100.0000 mg | ORAL_TABLET | Freq: Two times a day (BID) | ORAL | Status: DC
  Administered 2024-01-14 – 2024-01-18 (×9): 100 mg via ORAL
  Filled 2024-01-14 (×9): qty 2

## 2024-01-14 MED ORDER — ENSURE ENLIVE PO LIQD
237.0000 mL | Freq: Three times a day (TID) | ORAL | Status: DC
  Administered 2024-01-14 – 2024-01-15 (×3): 237 mL via ORAL

## 2024-01-14 MED ORDER — TRAVASOL 10 % IV SOLN
INTRAVENOUS | Status: AC
  Filled 2024-01-14: qty 990

## 2024-01-14 MED ORDER — SUCRALFATE 1 GM/10ML PO SUSP
1.0000 g | Freq: Three times a day (TID) | ORAL | Status: DC
  Administered 2024-01-14 – 2024-01-18 (×13): 1 g via ORAL
  Filled 2024-01-14 (×15): qty 10

## 2024-01-14 MED ORDER — PANTOPRAZOLE SODIUM 40 MG PO TBEC
40.0000 mg | DELAYED_RELEASE_TABLET | Freq: Two times a day (BID) | ORAL | Status: DC
  Administered 2024-01-14 – 2024-01-18 (×9): 40 mg via ORAL
  Filled 2024-01-14 (×9): qty 1

## 2024-01-14 MED ORDER — ACETAMINOPHEN 325 MG PO TABS: 650.0000 mg | ORAL_TABLET | Freq: Four times a day (QID) | ORAL | Status: DC | PRN

## 2024-01-14 NOTE — Progress Notes (Signed)
52 Days Post-Op   Subjective/Chief Complaint: Patient tolerated g tube clamping all day yesterday with clear liquids.    Objective: Vital signs in last 24 hours: Temp:  [97.5 F (36.4 C)-98.8 F (37.1 C)] 98.8 F (37.1 C) (01/24 0750) Pulse Rate:  [80-105] 80 (01/24 1133) Resp:  [15-19] 19 (01/24 1133) BP: (150-161)/(76-86) 161/86 (01/24 1133) SpO2:  [96 %-98 %] 96 % (01/24 1133) Last BM Date : 01/12/24  Intake/Output from previous day: 01/23 0701 - 01/24 0700 In: -  Out: 200 [Drains:200] Intake/Output this shift: No intake/output data recorded.  Physical Exam:  General appearance: OOB in chair.  Continues to look stronger.  Resp: breathing comfortably GI: soft, non distended. No rigidity or guarding. No visible drainage from drain sites. G tube in place Extremities: tr LE edema  Lab Results:  No results for input(s): "WBC", "HGB", "HCT", "PLT" in the last 72 hours.   BMET Recent Labs    01/13/24 0518  NA 142  K 4.0  CL 109  CO2 26  GLUCOSE 174*  BUN 23  CREATININE 0.35*  CALCIUM 8.1*   PT/INR No results for input(s): "LABPROT", "INR" in the last 72 hours.    ABG No results for input(s): "PHART", "HCO3" in the last 72 hours.  Invalid input(s): "PCO2", "PO2"   Studies/Results:  Anti-infectives: Anti-infectives (From admission, onward)    Start     Dose/Rate Route Frequency Ordered Stop   01/13/24 1630  cefTRIAXone (ROCEPHIN) 2 g in sodium chloride 0.9 % 100 mL IVPB  Status:  Discontinued       Note to Pharmacy: Pharmacy may adjust dosing strength / duration / interval for maximal efficacy   2 g 200 mL/hr over 30 Minutes Intravenous Every 24 hours 01/13/24 0923 01/13/24 1309   01/13/24 1630  metroNIDAZOLE (FLAGYL) IVPB 500 mg  Status:  Discontinued        500 mg 100 mL/hr over 60 Minutes Intravenous Every 12 hours 01/13/24 0923 01/13/24 1309   12/29/23 1630  cefTRIAXone (ROCEPHIN) 2 g in sodium chloride 0.9 % 100 mL IVPB  Status:  Discontinued        Note to Pharmacy: Pharmacy may adjust dosing strength / duration / interval for maximal efficacy   2 g 200 mL/hr over 30 Minutes Intravenous Every 24 hours 12/29/23 1536 01/13/24 0923   12/29/23 1630  metroNIDAZOLE (FLAGYL) IVPB 500 mg  Status:  Discontinued        500 mg 100 mL/hr over 60 Minutes Intravenous Every 12 hours 12/29/23 1536 01/13/24 0923   12/24/23 1000  piperacillin-tazobactam (ZOSYN) IVPB 3.375 g  Status:  Discontinued        3.375 g 12.5 mL/hr over 240 Minutes Intravenous Every 8 hours 12/24/23 0913 12/29/23 1536   12/13/23 1700  piperacillin-tazobactam (ZOSYN) IVPB 3.375 g        3.375 g 12.5 mL/hr over 240 Minutes Intravenous Every 8 hours 12/13/23 1412 12/13/23 2139   11/30/23 1230  fluconazole (DIFLUCAN) IVPB 400 mg  Status:  Discontinued        400 mg 100 mL/hr over 120 Minutes Intravenous Every 24 hours 11/30/23 1135 12/28/23 1321   11/26/23 0945  piperacillin-tazobactam (ZOSYN) IVPB 3.375 g  Status:  Discontinued        3.375 g 12.5 mL/hr over 240 Minutes Intravenous Every 8 hours 11/26/23 0853 12/13/23 1412   11/23/23 2100  ceFAZolin (ANCEF) IVPB 2g/100 mL premix        2 g 200 mL/hr over 30  Minutes Intravenous Every 8 hours 11/23/23 1654 11/23/23 2214   11/23/23 0700  ceFAZolin (ANCEF) IVPB 2g/100 mL premix        2 g 200 mL/hr over 30 Minutes Intravenous On call to O.R. 11/23/23 0654 11/23/23 1249       Assessment/Plan: Ms. Headings is an 88 year old female status post Whipple Procedure on 11/23/2023, (POD 52).  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 Rare candida seen on final culture. Completed Zosyn. Fluc stopped yesterday as repeat cx negative for candida. Repeat CT 1/2 with abdominal wall collection now s/p IR aspiration 1/3 and bedside I&D 1/4.  Zosyn restarted 1/3 - stopped 1/8 and switched to  ceftriaxone/flagyl based on cx of citrobacter and b frag.  Plan 14 days, antibiotics stopped yesterday.  Pancreatic leak - some leakage via abd wound. R drain removed 12/31/23.  All drains out now.  Delayed gastric emptying. Qtc ok.  Continue reglan. TPN. G tube placed.  Continue g tube clamping and try full liquids.  HOCM - Continue beta blockade and treat hypertension. IV metoprolol q4h per Cardiology.   Anemia of chronic disease, chronic blood loss anemia, and acute blood loss anemia- S/p 1U PRBC 12/27.  Hyperglycemia - improving, monitor. 1-5 units per day.  Hypophosphatemia - resolved Hypokalemia - resolved. Pharmacy monitoring.  Severe protein calorie malnutrition - TNA.  RUE DVT - treatment dose lovenox.  New picc placed opposite side.   OOB, IS, PT consult.  Thrush - nystatin suspension  FEN - TPN. Cont PPI BID & Carafate via tube. G tube clamping. Full liquids today.  If still no n/v by Sunday, try soft diet.   VTE - lovenox treatment dose - cont. Right sided picc out.   ID - Fluconazole- stopped 1/7, resumed Zosyn 1/3 >> afebrile with normal WBCs.  Switched to ceftriaxone and flagyl 1/8. Stop antibiotics today.  Dispo: Panc leak appears nearly resolved. Delayed gastric emptying also appears to be resolving.  Antibiotics off.  Still on TPN.  Don't think will be LTAC candidate with antibiotics off.  This would mean TPN, dressing change/flush g tube, and slow advance of diet/calorie counts in conjunction with tpn wean.   Converting some meds to oral. Hope to convert reglan to oral also Sunday.    LOS: 52 days    01/14/2024, 1:31 PM   Almond Lint MD FACS [phone] for weekday/non holidays Check [email or web address] for General surgery/trauma coverage night/weekend/holidays

## 2024-01-14 NOTE — TOC Benefit Eligibility Note (Signed)
Pharmacy Patient Advocate Encounter  Insurance verification completed.    The patient is insured through [employer]. Patient has Medicare and is not eligible for a copay card, but may be able to apply for patient assistance or Medicare RX Payment Plan (Patient Must reach out to their plan, if eligible for payment plan), if available.    Ran test claim for ELIQUIS 5MG  and the current 30 day co-pay is 570.21 (includes a deductible).   This test claim was processed through [facility]- copay amounts may vary at other pharmacies due to pharmacy/plan contracts, or as the patient moves through the different stages of their insurance plan.

## 2024-01-14 NOTE — Plan of Care (Signed)
  Problem: Activity: Goal: Risk for activity intolerance will decrease Outcome: Progressing   Problem: Coping: Goal: Level of anxiety will decrease Outcome: Progressing   Problem: Pain Management: Goal: General experience of comfort will improve Outcome: Progressing   Problem: Safety: Goal: Ability to remain free from injury will improve Outcome: Progressing

## 2024-01-14 NOTE — Progress Notes (Signed)
   01/14/24 1215  Gastrointestinal  GI Symptoms Vomiting  Emesis Characteristics  Emesis Appearance Bile    Pt with a witnessed episode of emesis. Pt denies nausea. G tube unclamped. PRN zofran given. PA paged and notified.

## 2024-01-14 NOTE — Progress Notes (Signed)
Occupational Therapy Treatment Patient Details Name: Christine Morse MRN: [id number] DOB: 1936-09-08 Today's Date: 01/14/2024   History of present illness Patient is an 88 y/o female admitted 11/23/23 with diagnosis of duodenal adenoma and underwent laparoscopic Whipple procedure. post-op subcapsular fluid collection around the anterior aspect of the left liver lobe. Drain placement in IR 12/03/23. NGT inserted 12/15, dislodged then replaced.  Positive for R subclavian and axillary DVT on 12/07/23. 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   OT comments  Patient received in recliner and declined mobility or transfer training. Patient seen to address LB dressing with AE with patient able to perform with mod assist. Patient instructed in BUE HEP with AROM for shoulder exercises and level one therapy band for elbow flexion/extension. Patient will benefit from continued inpatient follow up therapy, <3 hours/day with acute OT to continue to follow to address established goals to facilitate discharge to next venue.       If plan is discharge home, recommend the following:  A lot of help with bathing/dressing/bathroom;A little help with walking and/or transfers;Assist for transportation;Assistance with cooking/housework;Help with stairs or ramp for entrance   Equipment Recommendations  None recommended by OT    Recommendations for Other Services      Precautions / Restrictions Precautions Precautions: Fall Precaution Comments: drains removed Restrictions Weight Bearing Restrictions Per Provider Order: No       Mobility Bed Mobility Overal bed mobility: Needs Assistance             General bed mobility comments: in the chair on arrival    Transfers Overall transfer level: Needs assistance                 General transfer comment: declined out of chair     Balance                                           ADL either  performed or assessed with clinical judgement   ADL Overall ADL's : Needs assistance/impaired     Grooming: Set up;Sitting               Lower Body Dressing: Moderate assistance;Sitting/lateral leans Lower Body Dressing Details (indicate cue type and reason): adaptive equipment training with donning pants with reacher and mod assist to perform               General ADL Comments: declined going to bathroom to perform self care tasks    Extremity/Trunk Assessment              Vision       Perception     Praxis      Cognition Arousal: Alert Behavior During Therapy: [facility] for tasks assessed/performed                                 Problem Solving: Slow processing General Comments: follow commands, declined mobility        Exercises Exercises: General Upper Extremity General Exercises - Upper Extremity Shoulder Flexion: AROM, Both, 10 reps, Seated Shoulder ABduction: AROM, Both, 10 reps, Seated Shoulder Horizontal ABduction: AROM, Both, 10 reps, Seated Elbow Flexion: Strengthening, Both, 10 reps, Seated, Theraband Theraband Level (Elbow Flexion): Level 1 (Yellow) Elbow Extension: Strengthening, Both, 10 reps, Seated, Theraband Theraband Level (Elbow Extension): Level  1 (Yellow)    Shoulder Instructions       General Comments VSS on RA    Pertinent Vitals/ Pain       Pain Assessment Pain Assessment: No/denies pain Pain Intervention(s): Monitored during session  Home Living                                          Prior Functioning/Environment              Frequency  Min 1X/week        Progress Toward Goals  OT Goals(current goals can now be found in the care plan section)  Progress towards OT goals: Progressing toward goals  Acute Rehab OT Goals Patient Stated Goal: none stated OT Goal Formulation: With patient Time For Goal Achievement: 01/18/24 Potential to Achieve Goals: Good ADL Goals Pt  Will Perform Grooming: with supervision;standing Pt Will Perform Upper Body Bathing: with set-up;sitting Pt Will Perform Lower Body Bathing: sit to/from stand;with contact guard assist Pt Will Perform Lower Body Dressing: with contact guard assist;sit to/from stand Pt Will Transfer to Toilet: with supervision;ambulating;regular height toilet Pt/caregiver will Perform Home Exercise Program: Increased strength;Both right and left upper extremity;With minimal assist;With written HEP provided;With theraband Additional ADL Goal #1: Pt will verbalize 3 strategies to reduce risk of falls with min A  Plan      Co-evaluation                 AM-PAC OT "6 Clicks" Daily Activity     Outcome Measure   Help from another person eating meals?: A Little Help from another person taking care of personal grooming?: A Little Help from another person toileting, which includes using toliet, bedpan, or urinal?: A Lot Help from another person bathing (including washing, rinsing, drying)?: A Lot Help from another person to put on and taking off regular upper body clothing?: A Little Help from another person to put on and taking off regular lower body clothing?: A Lot 6 Click Score: 15    End of Session    OT Visit Diagnosis: Unsteadiness on feet (R26.81);Muscle weakness (generalized) (M62.81)   Activity Tolerance Patient tolerated treatment well   Patient Left in chair;with call bell/phone within reach;with chair alarm set   Nurse Communication Mobility status        Time: 1324-4010 OT Time Calculation (min): 20 min  Charges: OT General Charges $OT Visit: 1 Visit OT Treatments $Self Care/Home Management : 8-22 mins  Alfonse Flavors, OTA Acute Rehabilitation Services  Office [phone]   Dewain Penning 01/14/2024, 12:27 PM

## 2024-01-14 NOTE — Progress Notes (Signed)
PHARMACY - TOTAL PARENTERAL NUTRITION CONSULT NOTE  Indication:  delayed gastric emptying  Patient Measurements: Height: 4\' 10"  (147.3 cm) Weight: 51.1 kg (112 lb 9.6 oz) IBW/kg (Calculated) : 40.9 TPN AdjBW (KG): 44.5 Body mass index is 23.53 kg/m. 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  Patient was started on a CLD on 12/6-12/9 and then advanced to a soft diet since 12/10.  Patient has had minimal PO intake since being post-op and continues to experience nausea. Delayed gastric emptying likely related to possible pancreatic leak.  Pharmacy consulted to dose TPN.  1/23 Per MD, G tube clamped, will take several days to know if tolerating PO intake.   Glucose / Insulin: A1c 3.7 (11/15/23) - BG < 150, used 4 units SSI/24hr  Electrolytes: last labs 1/23- Corr Ca 9.5, others wnl Renal: SCr < 1, BUN 20s Hepatic: LFTs wnl, albumin 2.3, TG 59 Intake / Output; MIVF: IV metoclopramide Q8hr, sucralfate; PEG tube drain 200 mL, tube clamped, LBM 1/23. Net I/O not accurate given unmeasured UOP.  GI Imaging: 12/7 XR: unremarkable bowel gas pattern. 12/11 CT: possible post-op seroma or hematoma vs abscess, distal colonic diverticulosis 12/15 XR: expected post-op changes, NGT in correct place 12/26 CT: perc drain cath adj left lobe liver, no discernable residual fluid collection. Post whipple w/ no evidence pancreatic ductal dilatation or peripancreatic fluid collection, small amount of free fluid in pelvis 1/2 CT - minimal fluid near drain in abdomen   GI Surgeries / Procedures:  12/3 diagnostic laparoscopy, Whipple procedure, pancreatic duct stent 12/13 subhepatic fluid collection aspiration and drainage 1/3 IR US drain placement of abdominal abscess 1/15- IR placement gastrostomy tube    Central access: PICC replaced 12/27/23 TPN start date: 12/02/23  Nutritional Goals: RD Estimated Needs Total Energy Estimated Needs: 1700-2000 kcal Total Protein Estimated Needs: 85-100 gm Total Fluid Estimated Needs: 1 ml/kcal  Current Nutrition:  TPN >> cycled 1/14  1/21 G Tube clamping trial - 4hr clamped, 2 hrs gravity 1/22 G Tube clamping trial - 7hr clamped, 1 hr gravity 1/23 G Tube clamping trial x24hr, CLD, Boost   Plan:  Cyclic TPN over 14 hour (Taper up/down 1 hr; GIR 2.5- 5.9 g/kg/min), provides 1732 kCal and 99g AA, meeting 100% of nutritional needs.  Electrolytes in TPN: Na 128mEq/L, K 59mEq/L, Ca 3 mEq/L, Mg 42mEq/L, Phos 53mmol/L, Cl:Ac 1:1.  MVI and trace added to TPN Monitor TPN labs on Mon/Thurs and prn  Stop sensitive sliding scale and BG checks  F/u PO tolerance, wean TPN as able   Thank you for involving pharmacy in this patient's care.  Alphia Moh, PharmD, BCPS, BCCP Clinical Pharmacist  Please check AMION for all [facility] Pharmacy phone numbers After 10:00 PM, call Main Pharmacy [phone]

## 2024-01-14 NOTE — Plan of Care (Signed)
Problem: Education: Goal: Knowledge of General Education information will improve Description: Including pain rating scale, medication(s)/side effects and non-pharmacologic comfort measures Outcome: Progressing   Problem: Health Behavior/Discharge Planning: Goal: Ability to manage health-related needs will improve Outcome: Progressing   Problem: Clinical Measurements: Goal: Ability to maintain clinical measurements within normal limits will improve Outcome: Progressing Goal: Will remain free from infection Outcome: Progressing Goal: Diagnostic test results will improve Outcome: Progressing Goal: Cardiovascular complication will be avoided Outcome: Progressing   Problem: Activity: Goal: Risk for activity intolerance will decrease Outcome: Progressing   Problem: Nutrition: Goal: Adequate nutrition will be maintained Outcome: Progressing   Problem: Coping: Goal: Level of anxiety will decrease Outcome: Progressing   Problem: Elimination: Goal: Will not experience complications related to bowel motility Outcome: Progressing   Problem: Pain Management: Goal: General experience of comfort will improve Outcome: Progressing   Problem: Safety: Goal: Ability to remain free from injury will improve Outcome: Progressing   Problem: Skin Integrity: Goal: Risk for impaired skin integrity will decrease Outcome: Progressing   Problem: Education: Goal: Ability to describe self-care measures that may prevent or decrease complications (Diabetes Survival Skills Education) will improve Outcome: Progressing Goal: Individualized Educational Video(s) Outcome: Progressing   Problem: Coping: Goal: Ability to adjust to condition or change in health will improve Outcome: Progressing   Problem: Fluid Volume: Goal: Ability to maintain a balanced intake and output will improve Outcome: Progressing   Problem: Health Behavior/Discharge Planning: Goal: Ability to identify and utilize  available resources and services will improve Outcome: Progressing Goal: Ability to manage health-related needs will improve Outcome: Progressing   Problem: Metabolic: Goal: Ability to maintain appropriate glucose levels will improve Outcome: Progressing   Problem: Nutritional: Goal: Maintenance of adequate nutrition will improve Outcome: Progressing Goal: Progress toward achieving an optimal weight will improve Outcome: Progressing   Problem: Skin Integrity: Goal: Risk for impaired skin integrity will decrease Outcome: Progressing   Problem: Clinical Measurements: Goal: Ability to maintain clinical measurements within normal limits will improve Outcome: Progressing Goal: Postoperative complications will be avoided or minimized Outcome: Progressing   Problem: Skin Integrity: Goal: Demonstration of wound healing without infection will improve Outcome: Progressing

## 2024-01-15 LAB — CBC
HCT: 32.2 % — ABNORMAL LOW (ref 36.0–46.0)
Hemoglobin: 9.9 g/dL — ABNORMAL LOW (ref 12.0–15.0)
MCH: 34 pg (ref 26.0–34.0)
MCHC: 30.7 g/dL (ref 30.0–36.0)
MCV: 110.7 fL — ABNORMAL HIGH (ref 80.0–100.0)
Platelets: 373 10*3/uL (ref 150–400)
RBC: 2.91 MIL/uL — ABNORMAL LOW (ref 3.87–5.11)
RDW: 18.9 % — ABNORMAL HIGH (ref 11.5–15.5)
WBC: 6.5 10*3/uL (ref 4.0–10.5)
nRBC: 0.3 % — ABNORMAL HIGH (ref 0.0–0.2)

## 2024-01-15 LAB — MAGNESIUM: Magnesium: 2.1 mg/dL (ref 1.7–2.4)

## 2024-01-15 LAB — BASIC METABOLIC PANEL
Anion gap: 9 (ref 5–15)
BUN: 24 mg/dL — ABNORMAL HIGH (ref 8–23)
CO2: 29 mmol/L (ref 22–32)
Calcium: 8.6 mg/dL — ABNORMAL LOW (ref 8.9–10.3)
Chloride: 113 mmol/L — ABNORMAL HIGH (ref 98–111)
Creatinine, Ser: 0.54 mg/dL (ref 0.44–1.00)
GFR, Estimated: >60 mL/min (ref >60)
Glucose, Bld: 117 mg/dL — ABNORMAL HIGH (ref 70–99)
Potassium: 3.8 mmol/L (ref 3.5–5.1)
Sodium: 151 mmol/L — ABNORMAL HIGH (ref 135–145)

## 2024-01-15 LAB — PHOSPHORUS: Phosphorus: 3.2 mg/dL (ref 2.5–4.6)

## 2024-01-15 MED ORDER — TRAVASOL 10 % IV SOLN: INTRAVENOUS | Status: DC

## 2024-01-15 MED ORDER — ADULT MULTIVITAMIN W/MINERALS CH
1.0000 | ORAL_TABLET | Freq: Every day | ORAL | Status: DC
  Administered 2024-01-16 – 2024-01-18 (×3): 1 via ORAL
  Filled 2024-01-15 (×3): qty 1

## 2024-01-15 MED ORDER — TRAVASOL 10 % IV SOLN
INTRAVENOUS | Status: AC
  Filled 2024-01-15: qty 986

## 2024-01-15 NOTE — Progress Notes (Signed)
PHARMACY - TOTAL PARENTERAL NUTRITION CONSULT NOTE  Indication:  delayed gastric emptying  Patient Measurements: Height: 4\' 10"  (147.3 cm) Weight: 51.1 kg (112 lb 9.6 oz) IBW/kg (Calculated) : 40.9 TPN AdjBW (KG): 44.5 Body mass index is 23.53 kg/m. 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  Patient was started on a CLD on 12/6-12/9 and then advanced to a soft diet since 12/10.  Patient has had minimal PO intake since being post-op and continues to experience nausea. Delayed gastric emptying likely related to possible pancreatic leak.  Pharmacy consulted to dose TPN.  1/23 Per MD, G tube clamped, will take several days to know if tolerating PO intake.   Glucose / Insulin: A1c 3.7 (11/15/23) - BG < 150, off SSI  Electrolytes: Na 151, Cl 113, CoCa 10, others wnl Renal: SCr < 1, BUN 20s Hepatic: LFTs wnl, albumin 2.3, TG 59 Intake / Output; MIVF: IV metoclopramide Q8hr, sucralfate;  G tube 650 mL (tube clamped most of day), LBM 1/23. Net I/O not accurate given unmeasured UOP.  GI Imaging: 12/7 XR: unremarkable bowel gas pattern. 12/11 CT: possible post-op seroma or hematoma vs abscess, distal colonic diverticulosis 12/15 XR: expected post-op changes, NGT in correct place 12/26 CT: perc drain cath adj left lobe liver, no discernable residual fluid collection. Post whipple w/ no evidence pancreatic ductal dilatation or peripancreatic fluid collection, small amount of free fluid in pelvis 1/2 CT - minimal fluid near drain in abdomen   GI Surgeries / Procedures:  12/3 diagnostic laparoscopy, Whipple procedure, pancreatic duct stent 12/13 subhepatic fluid collection aspiration and drainage 1/3 IR US drain placement of abdominal abscess 1/15- IR placement gastrostomy tube   Central access: PICC replaced 12/27/23 TPN start date:  12/02/23  Nutritional Goals: RD Estimated Needs Total Energy Estimated Needs: 1700-2000 kcal Total Protein Estimated Needs: 85-100 gm Total Fluid Estimated Needs: 1 ml/kcal  Current Nutrition:  TPN >> cycled 1/14  1/21 G Tube clamping trial - 4hr clamped, 2 hrs gravity 1/22 G Tube clamping trial - 7hr clamped, 1 hr gravity 1/23 G Tube clamping trial x24hr, CLD, Boost  1/24 continue GT clamp, FLD - drank some ensure, unclear how much  Plan:  Cyclic TPN over 14 hour (Taper up/down 1 hr; GIR 3-6 g/kg/min), provides 1714 kCal and 99g AA, meeting 100% of nutritional needs.  Electrolytes in TPN: decrease Na 50 mEq/L, increase K 60 mEq/L, Ca 3 mEq/L, Mg 7 mEq/L, Phos 15 mmol/L, change Cl:Ac 1:2.  Remove MVI and trace from TPN, give as PO  Monitor TPN labs on Mon/Thurs and prn   F/u PO tolerance, wean TPN as able   Thank you for involving pharmacy in this patient's care.  Alphia Moh, PharmD, BCPS, BCCP Clinical Pharmacist  Please check AMION for all [facility] Pharmacy phone numbers After 10:00 PM, call Main Pharmacy [phone]

## 2024-01-15 NOTE — Progress Notes (Signed)
53 Days Post-Op   Subjective/Chief Complaint: G tube back to gravity this am as of yesterday afternoon. Discussed with RN and patient and sounds like this was done after an episode of "emesis." Patient states she wasn't nauseas at the time but was trying to clear mucous during oral care when she coughed/vomited up mucous. She denies nausea or pain this am. She has continued to sip on liquids.   Objective: Vital signs in last 24 hours: Temp:  [97.4 F (36.3 C)-98.6 F (37 C)] 98.6 F (37 C) (01/25 0306) Pulse Rate:  [68-104] 68 (01/24 2307) Resp:  [18-19] 18 (01/24 1619) BP: (137-161)/(68-86) 146/71 (01/25 0306) SpO2:  [95 %-99 %] 96 % (01/25 0306) Last BM Date : 01/12/24  Intake/Output from previous day: 01/24 0701 - 01/25 0700 In: -  Out: 700 [Urine:50; Drains:650] Intake/Output this shift: No intake/output data recorded.  Physical Exam:  General appearance: in bed in NAD.  Resp: breathing comfortably GI: soft, non distended. No rigidity or guarding. No visible drainage from drain sites. G tube in place to gravity with bilious output Extremities: tr LE edema R>L  Lab Results:  Recent Labs    01/15/24 0452  WBC 6.5  HGB 9.9*  HCT 32.2*  PLT 373     BMET Recent Labs    01/13/24 0518  NA 142  K 4.0  CL 109  CO2 26  GLUCOSE 174*  BUN 23  CREATININE 0.35*  CALCIUM 8.1*   PT/INR No results for input(s): "LABPROT", "INR" in the last 72 hours.    ABG No results for input(s): "PHART", "HCO3" in the last 72 hours.  Invalid input(s): "PCO2", "PO2"   Studies/Results:  Anti-infectives: Anti-infectives (From admission, onward)    Start     Dose/Rate Route Frequency Ordered Stop   01/13/24 1630  cefTRIAXone (ROCEPHIN) 2 g in sodium chloride 0.9 % 100 mL IVPB  Status:  Discontinued       Note to Pharmacy: Pharmacy may adjust dosing strength / duration / interval for maximal efficacy   2 g 200 mL/hr over 30 Minutes Intravenous Every 24 hours 01/13/24 0923  01/13/24 1309   01/13/24 1630  metroNIDAZOLE (FLAGYL) IVPB 500 mg  Status:  Discontinued        500 mg 100 mL/hr over 60 Minutes Intravenous Every 12 hours 01/13/24 0923 01/13/24 1309   12/29/23 1630  cefTRIAXone (ROCEPHIN) 2 g in sodium chloride 0.9 % 100 mL IVPB  Status:  Discontinued       Note to Pharmacy: Pharmacy may adjust dosing strength / duration / interval for maximal efficacy   2 g 200 mL/hr over 30 Minutes Intravenous Every 24 hours 12/29/23 1536 01/13/24 0923   12/29/23 1630  metroNIDAZOLE (FLAGYL) IVPB 500 mg  Status:  Discontinued        500 mg 100 mL/hr over 60 Minutes Intravenous Every 12 hours 12/29/23 1536 01/13/24 0923   12/24/23 1000  piperacillin-tazobactam (ZOSYN) IVPB 3.375 g  Status:  Discontinued        3.375 g 12.5 mL/hr over 240 Minutes Intravenous Every 8 hours 12/24/23 0913 12/29/23 1536   12/13/23 1700  piperacillin-tazobactam (ZOSYN) IVPB 3.375 g        3.375 g 12.5 mL/hr over 240 Minutes Intravenous Every 8 hours 12/13/23 1412 12/13/23 2139   11/30/23 1230  fluconazole (DIFLUCAN) IVPB 400 mg  Status:  Discontinued        400 mg 100 mL/hr over 120 Minutes Intravenous Every 24 hours 11/30/23 1135  12/28/23 1321   11/26/23 0945  piperacillin-tazobactam (ZOSYN) IVPB 3.375 g  Status:  Discontinued        3.375 g 12.5 mL/hr over 240 Minutes Intravenous Every 8 hours 11/26/23 0853 12/13/23 1412   11/23/23 2100  ceFAZolin (ANCEF) IVPB 2g/100 mL premix        2 g 200 mL/hr over 30 Minutes Intravenous Every 8 hours 11/23/23 1654 11/23/23 2214   11/23/23 0700  ceFAZolin (ANCEF) IVPB 2g/100 mL premix        2 g 200 mL/hr over 30 Minutes Intravenous On call to O.R. 11/23/23 0654 11/23/23 1249       Assessment/Plan: Christine Morse is an 88 year old female status post Whipple Procedure on 11/23/2023, (POD 53).  Final pathology - adenocarcinoma duodenum, positive margin, positive LN - pT3pN2cM0  Abdominal fluid collection - CT aspiration and drain placement 12/13  (anterior hepatic fluid collection) - serosang. Rare candida seen on final culture. Completed Zosyn. Fluc stopped yesterday as repeat cx negative for candida. Repeat CT 1/2 with abdominal wall collection now s/p IR aspiration 1/3 and bedside I&D 1/4.  Zosyn restarted 1/3 - stopped 1/8 and switched to ceftriaxone/flagyl based on cx of citrobacter and b frag.  Plan 14 days, antibiotics stopped yesterday.  Pancreatic leak - some leakage via abd wound. R drain removed 12/31/23.  All drains out now.  Delayed gastric emptying. Qtc ok.  Continue reglan. TPN. G tube placed.  Continue g tube clamping and try full liquids.  HOCM - Continue beta blockade and treat hypertension. IV metoprolol q4h per Cardiology.   Anemia of chronic disease, chronic blood loss anemia, and acute blood loss anemia- S/p 1U PRBC 12/27.  Hyperglycemia - improving, monitor. 1-5 units per day.  Hypophosphatemia - resolved Hypokalemia - resolved. Pharmacy monitoring.  Severe protein calorie malnutrition - TNA.  RUE DVT - treatment dose lovenox.  New picc placed opposite side.   OOB, IS, PT consult.  Thrush - nystatin suspension  FEN - TPN. Cont PPI BID & Carafate via tube. G tube clamping. Continue full liquids today. G tube back to gravity for n/v.  If still no n/v by Sunday, try soft diet.   VTE - lovenox treatment dose - cont. Right sided picc out.   ID - Fluconazole- stopped 1/7, resumed Zosyn 1/3 >> afebrile with normal WBCs.  Switched to ceftriaxone and flagyl 1/8. Stop antibiotics today.  Dispo: Panc leak appears nearly resolved. Delayed gastric emptying also appears to be resolving.  Antibiotics off.  Still on TPN.  Don't think will be LTAC candidate with antibiotics off.  This would mean TPN, dressing change/flush g tube, and slow advance of diet/calorie counts in conjunction with tpn wean.   Converting some meds to oral. Hope to convert reglan to oral also Sunday.    LOS: 53 days    01/15/2024, 8:51 AM   Eric Form, [facility] 01/15/2024, 11:54 AM Please see Amion for pager number during day hours 7:00am-4:30pm

## 2024-01-16 LAB — BASIC METABOLIC PANEL
Anion gap: 9 (ref 5–15)
BUN: 26 mg/dL — ABNORMAL HIGH (ref 8–23)
CO2: 28 mmol/L (ref 22–32)
Calcium: 8.5 mg/dL — ABNORMAL LOW (ref 8.9–10.3)
Chloride: 111 mmol/L (ref 98–111)
Creatinine, Ser: 0.41 mg/dL — ABNORMAL LOW (ref 0.44–1.00)
GFR, Estimated: >60 mL/min (ref >60)
Glucose, Bld: 175 mg/dL — ABNORMAL HIGH (ref 70–99)
Potassium: 4.1 mmol/L (ref 3.5–5.1)
Sodium: 148 mmol/L — ABNORMAL HIGH (ref 135–145)

## 2024-01-16 MED ORDER — TRAVASOL 10 % IV SOLN
INTRAVENOUS | Status: AC
  Filled 2024-01-16: qty 986

## 2024-01-16 NOTE — Progress Notes (Signed)
Doing basic mouth care for patient, oral suctioning and patient vomitted roughly 50-100cc (unmeasured) of bright green bile. Also, noted some thick grey/white phelgm came up right after the bile emesis. Nausea meds given. Surgery PA sent message about episode.

## 2024-01-16 NOTE — Progress Notes (Signed)
PHARMACY - TOTAL PARENTERAL NUTRITION CONSULT NOTE  Indication:  delayed gastric emptying  Patient Measurements: Height: 4\' 10"  (147.3 cm) Weight: 51.1 kg (112 lb 9.6 oz) IBW/kg (Calculated) : 40.9 TPN AdjBW (KG): 44.5 Body mass index is 23.53 kg/m. 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  Patient was started on a CLD on 12/6-12/9 and then advanced to a soft diet since 12/10.  Patient has had minimal PO intake since being post-op and continues to experience nausea. Delayed gastric emptying likely related to possible pancreatic leak.  Pharmacy consulted to dose TPN.  1/23 Per MD, G tube clamped, will take several days to know if tolerating PO intake.   Glucose / Insulin: A1c 3.7 (11/15/23) - BG < 180, off SSI  Electrolytes: Na 148 down, CoCa 9.9, others wnl Renal: SCr < 1, BUN 20s Hepatic: LFTs wnl, albumin 2.3, TG 59 Intake / Output; MIVF: IV metoclopramide Q8hr, sucralfate; G tube to gravity-69ml out, LBM 1/23. Net I/O not accurate given unmeasured UOP.  GI Imaging: 12/7 XR: unremarkable bowel gas pattern. 12/11 CT: possible post-op seroma or hematoma vs abscess, distal colonic diverticulosis 12/15 XR: expected post-op changes, NGT in correct place 12/26 CT: perc drain cath adj left lobe liver, no discernable residual fluid collection. Post whipple w/ no evidence pancreatic ductal dilatation or peripancreatic fluid collection, small amount of free fluid in pelvis 1/2 CT - minimal fluid near drain in abdomen   GI Surgeries / Procedures:  12/3 diagnostic laparoscopy, Whipple procedure, pancreatic duct stent 12/13 subhepatic fluid collection aspiration and drainage 1/3 IR US drain placement of abdominal abscess 1/15- IR placement gastrostomy tube   Central access: PICC replaced 12/27/23 TPN start date: 12/02/23  Nutritional Goals:  RD Estimated Needs Total Energy Estimated Needs: 1700-2000 kcal Total Protein Estimated Needs: 85-100 gm Total Fluid Estimated Needs: 1 ml/kcal  Current Nutrition:  TPN >> cycled 1/14  1/21 G Tube clamping trial - 4hr clamped, 2 hrs gravity 1/22 G Tube clamping trial - 7hr clamped, 1 hr gravity 1/23 G Tube clamping trial x24hr, CLD, Boost  1/24 G Tube clamping trial, FLD - drank some ensure, unclear how much; had questionable emesis of mucous >> GT to gravity 1/25 G Tube to gravity, FLD - only had 2 sips of ensure and 2 bites of magic cup  Plan:  Cyclic TPN over 14 hour (Taper up/down 1 hr; GIR 3-6 g/kg/min), provides 1714 kCal and 99g AA, meeting 100% of nutritional needs.  Electrolytes in TPN: Na 50 mEq/L, K 60 mEq/L (= 102 mEq), Ca 3 mEq/L, Mg 7 mEq/L, Phos 15 mmol/L, Cl:Ac 1:2.  PO MVI and trace elements  Monitor TPN labs on Mon/Thurs and prn   F/u PO intake, wean TPN as able   Thank you for involving pharmacy in this patient's care.  Alphia Moh, PharmD, BCPS, BCCP Clinical Pharmacist  Please check AMION for all [facility] phone numbers After 10:00 PM, call Main Pharmacy [phone]

## 2024-01-16 NOTE — Plan of Care (Signed)
Problem: Education: Goal: Knowledge of General Education information will improve Description: Including pain rating scale, medication(s)/side effects and non-pharmacologic comfort measures Outcome: Progressing   Problem: Health Behavior/Discharge Planning: Goal: Ability to manage health-related needs will improve Outcome: Progressing   Problem: Clinical Measurements: Goal: Ability to maintain clinical measurements within normal limits will improve Outcome: Progressing Goal: Will remain free from infection Outcome: Progressing Goal: Diagnostic test results will improve Outcome: Progressing Goal: Cardiovascular complication will be avoided Outcome: Progressing   Problem: Activity: Goal: Risk for activity intolerance will decrease Outcome: Progressing   Problem: Nutrition: Goal: Adequate nutrition will be maintained Outcome: Progressing   Problem: Coping: Goal: Level of anxiety will decrease Outcome: Progressing   Problem: Elimination: Goal: Will not experience complications related to bowel motility Outcome: Progressing   Problem: Pain Management: Goal: General experience of comfort will improve Outcome: Progressing   Problem: Safety: Goal: Ability to remain free from injury will improve Outcome: Progressing   Problem: Skin Integrity: Goal: Risk for impaired skin integrity will decrease Outcome: Progressing   Problem: Education: Goal: Ability to describe self-care measures that may prevent or decrease complications (Diabetes Survival Skills Education) will improve Outcome: Progressing Goal: Individualized Educational Video(s) Outcome: Progressing   Problem: Coping: Goal: Ability to adjust to condition or change in health will improve Outcome: Progressing   Problem: Fluid Volume: Goal: Ability to maintain a balanced intake and output will improve Outcome: Progressing   Problem: Health Behavior/Discharge Planning: Goal: Ability to identify and utilize  available resources and services will improve Outcome: Progressing Goal: Ability to manage health-related needs will improve Outcome: Progressing   Problem: Metabolic: Goal: Ability to maintain appropriate glucose levels will improve Outcome: Progressing   Problem: Nutritional: Goal: Maintenance of adequate nutrition will improve Outcome: Progressing Goal: Progress toward achieving an optimal weight will improve Outcome: Progressing   Problem: Skin Integrity: Goal: Risk for impaired skin integrity will decrease Outcome: Progressing   Problem: Clinical Measurements: Goal: Ability to maintain clinical measurements within normal limits will improve Outcome: Progressing Goal: Postoperative complications will be avoided or minimized Outcome: Progressing   Problem: Skin Integrity: Goal: Demonstration of wound healing without infection will improve Outcome: Progressing

## 2024-01-16 NOTE — Progress Notes (Signed)
54 Days Post-Op   Subjective/Chief Complaint: G tube back remained clamped. No nausea vomiting or bloating but only had a few bites of fulls  Objective: Vital signs in last 24 hours: Temp:  [97.7 F (36.5 C)-98.9 F (37.2 C)] 97.7 F (36.5 C) (01/26 1144) Pulse Rate:  [67-91] 79 (01/26 1144) Resp:  [15-21] 20 (01/26 1144) BP: (129-174)/(65-83) 134/77 (01/26 1144) SpO2:  [90 %-97 %] 90 % (01/26 1144) Last BM Date : 01/12/24  Intake/Output from previous day: 01/25 0701 - 01/26 0700 In: 509.6 [I.V.:509.6] Out: -  Intake/Output this shift: No intake/output data recorded.  Physical Exam:  General appearance: in bed in NAD.  Resp: breathing comfortably GI: soft, non distended. No rigidity or guarding. No visible drainage from drain sites. G tube in place clamped Extremities: tr LE edema R>L  Lab Results:  Recent Labs    01/15/24 0452  WBC 6.5  HGB 9.9*  HCT 32.2*  PLT 373     BMET Recent Labs    01/15/24 0830 01/16/24 0623  NA 151* 148*  K 3.8 4.1  CL 113* 111  CO2 29 28  GLUCOSE 117* 175*  BUN 24* 26*  CREATININE 0.54 0.41*  CALCIUM 8.6* 8.5*   PT/INR No results for input(s): "LABPROT", "INR" in the last 72 hours.    ABG No results for input(s): "PHART", "HCO3" in the last 72 hours.  Invalid input(s): "PCO2", "PO2"   Studies/Results:  Anti-infectives: Anti-infectives (From admission, onward)    Start     Dose/Rate Route Frequency Ordered Stop   01/13/24 1630  cefTRIAXone (ROCEPHIN) 2 g in sodium chloride 0.9 % 100 mL IVPB  Status:  Discontinued       Note to Pharmacy: Pharmacy may adjust dosing strength / duration / interval for maximal efficacy   2 g 200 mL/hr over 30 Minutes Intravenous Every 24 hours 01/13/24 0923 01/13/24 1309   01/13/24 1630  metroNIDAZOLE (FLAGYL) IVPB 500 mg  Status:  Discontinued        500 mg 100 mL/hr over 60 Minutes Intravenous Every 12 hours 01/13/24 0923 01/13/24 1309   12/29/23 1630  cefTRIAXone (ROCEPHIN) 2 g in  sodium chloride 0.9 % 100 mL IVPB  Status:  Discontinued       Note to Pharmacy: Pharmacy may adjust dosing strength / duration / interval for maximal efficacy   2 g 200 mL/hr over 30 Minutes Intravenous Every 24 hours 12/29/23 1536 01/13/24 0923   12/29/23 1630  metroNIDAZOLE (FLAGYL) IVPB 500 mg  Status:  Discontinued        500 mg 100 mL/hr over 60 Minutes Intravenous Every 12 hours 12/29/23 1536 01/13/24 0923   12/24/23 1000  piperacillin-tazobactam (ZOSYN) IVPB 3.375 g  Status:  Discontinued        3.375 g 12.5 mL/hr over 240 Minutes Intravenous Every 8 hours 12/24/23 0913 12/29/23 1536   12/13/23 1700  piperacillin-tazobactam (ZOSYN) IVPB 3.375 g        3.375 g 12.5 mL/hr over 240 Minutes Intravenous Every 8 hours 12/13/23 1412 12/13/23 2139   11/30/23 1230  fluconazole (DIFLUCAN) IVPB 400 mg  Status:  Discontinued        400 mg 100 mL/hr over 120 Minutes Intravenous Every 24 hours 11/30/23 1135 12/28/23 1321   11/26/23 0945  piperacillin-tazobactam (ZOSYN) IVPB 3.375 g  Status:  Discontinued        3.375 g 12.5 mL/hr over 240 Minutes Intravenous Every 8 hours 11/26/23 0853 12/13/23 1412   11/23/23  2100  ceFAZolin (ANCEF) IVPB 2g/100 mL premix        2 g 200 mL/hr over 30 Minutes Intravenous Every 8 hours 11/23/23 1654 11/23/23 2214   11/23/23 0700  ceFAZolin (ANCEF) IVPB 2g/100 mL premix        2 g 200 mL/hr over 30 Minutes Intravenous On call to O.R. 11/23/23 0654 11/23/23 1249       Assessment/Plan: Ms. Limes is an 88 year old female status post Whipple Procedure on 11/23/2023, (POD 54).  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 Rare candida seen on final culture. Completed Zosyn. Fluc stopped yesterday as repeat cx negative for candida. Repeat CT 1/2 with abdominal wall collection now s/p IR aspiration 1/3 and  bedside I&D 1/4.  Zosyn restarted 1/3 - stopped 1/8 and switched to ceftriaxone/flagyl based on cx of citrobacter and b frag.  Plan 14 days, antibiotics stopped yesterday.  Pancreatic leak - some leakage via abd wound. R drain removed 12/31/23.  All drains out now.  Delayed gastric emptying. Qtc ok.  Continue reglan. TPN. G tube placed.  Continue g tube clamping and fulls today. Did not eat much yesterday HOCM - Continue beta blockade and treat hypertension. IV metoprolol q4h per Cardiology.   Anemia of chronic disease, chronic blood loss anemia, and acute blood loss anemia- S/p 1U PRBC 12/27.  Hyperglycemia - improving, monitor. 1-5 units per day.  Hypophosphatemia - resolved Hypokalemia - resolved. Pharmacy monitoring.  Severe protein calorie malnutrition - TNA.  RUE DVT - treatment dose lovenox.  New picc placed opposite side.   OOB, IS, PT consult.  Thrush - nystatin suspension  FEN - TPN. Cont PPI BID & Carafate via tube. G tube clamping. Continue full liquids today. G tube back to gravity for n/v.  If still no n/v by Sunday, try soft diet.   VTE - lovenox treatment dose - cont. Right sided picc out.   ID - Fluconazole- stopped 1/7, resumed Zosyn 1/3 >> afebrile with normal WBCs.  Switched to ceftriaxone and flagyl 1/8. Stop antibiotics today.  Dispo: Panc leak appears nearly resolved. Delayed gastric emptying also appears to be resolving.  Antibiotics off.  Still on TPN.  Don't think will be LTAC candidate with antibiotics off.  This would mean TPN, dressing change/flush g tube, and slow advance of diet/calorie counts in conjunction with tpn wean.   Converting some meds to oral. Hope to convert reglan to oral also Monday if tolerating FLD and can advance.    LOS: 54 days    01/16/2024, 12:53 PM   Eric Form, [facility] 01/16/2024, 12:53 PM Please see Amion for pager number during day hours 7:00am-4:30pm

## 2024-01-17 LAB — COMPREHENSIVE METABOLIC PANEL
ALT: 69 U/L — ABNORMAL HIGH (ref 0–44)
AST: 47 U/L — ABNORMAL HIGH (ref 15–41)
Albumin: 2.7 g/dL — ABNORMAL LOW (ref 3.5–5.0)
Alkaline Phosphatase: 87 U/L (ref 38–126)
Anion gap: 10 (ref 5–15)
BUN: 30 mg/dL — ABNORMAL HIGH (ref 8–23)
CO2: 23 mmol/L (ref 22–32)
Calcium: 8.7 mg/dL — ABNORMAL LOW (ref 8.9–10.3)
Chloride: 108 mmol/L (ref 98–111)
Creatinine, Ser: 0.44 mg/dL (ref 0.44–1.00)
GFR, Estimated: >60 mL/min (ref >60)
Glucose, Bld: 207 mg/dL — ABNORMAL HIGH (ref 70–99)
Potassium: 4.3 mmol/L (ref 3.5–5.1)
Sodium: 141 mmol/L (ref 135–145)
Total Bilirubin: 0.6 mg/dL (ref 0.0–1.2)
Total Protein: 6.5 g/dL (ref 6.5–8.1)

## 2024-01-17 LAB — PHOSPHORUS: Phosphorus: 3.6 mg/dL (ref 2.5–4.6)

## 2024-01-17 LAB — TRIGLYCERIDES: Triglycerides: 136 mg/dL (ref <150)

## 2024-01-17 LAB — MAGNESIUM: Magnesium: 2.3 mg/dL (ref 1.7–2.4)

## 2024-01-17 MED ORDER — TRAVASOL 10 % IV SOLN
INTRAVENOUS | Status: AC
  Filled 2024-01-17: qty 986

## 2024-01-17 MED ORDER — PROSOURCE PLUS PO LIQD
30.0000 mL | Freq: Two times a day (BID) | ORAL | Status: DC
  Administered 2024-01-17 – 2024-01-18 (×2): 30 mL via ORAL
  Filled 2024-01-17 (×4): qty 30

## 2024-01-17 MED ORDER — METOCLOPRAMIDE HCL 5 MG PO TABS
5.0000 mg | ORAL_TABLET | Freq: Three times a day (TID) | ORAL | Status: DC
  Administered 2024-01-17 – 2024-01-18 (×5): 5 mg via ORAL
  Filled 2024-01-17 (×8): qty 1

## 2024-01-17 NOTE — Progress Notes (Signed)
Daily Progress Note   Patient Name: Christine Morse       Date: 01/17/2024 DOB: 01-Mar-1936  Age: 88 y.o. MRN#: [id number] Attending Physician: Almond Lint, MD Primary Care Physician: [name], MD Admit Date: 11/23/2023  Reason for Consultation/Follow-up: Establishing goals of care  Subjective: I have reviewed medical records including EPIC notes, MAR, any available advanced directives as necessary, and labs. Received report from primary RN - no acute concerns. RN reports patient has tolerated Gtube clamping trial since yesterday - no N/V but has minimal oral intake.  Went to visit patient at bedside - no family/visitors present. Patient was sitting up in chair awake, alert, oriented, and able to participate in conversation though she is very weak and frail appearing. No signs or non-verbal gestures of pain or discomfort noted. No respiratory distress, increased work of breathing, or secretions noted. She denies nausea/vomiting.  Emotional support provided to patient. She indicates she has been through a lot - provided validation. Attempted to continue GOC - she asks "did you call my daughter?" I indicated I have not spoken with her family but would be happy to call them to include in conversations. Patient requests I call her Christine Morse.  Attempted to call Christine Morse while at bedside via speakerphone - no answer - was not able to leave VM. There are no other family members patient wishes I reach out to.  Patient would like to schedule a meeting with Christine Morse, PMT, and herself for continued GOC. Will continue attempts to reach Christine Morse.   All questions and concerns addressed. Encouraged to call with questions and/or concerns. PMT card previously provided.  Provided updates to  RN. RN notes family visit later in the evenings around 6:30p - unfortunately this is after PMT hours. RN will give family PMT card with request to call to schedule meeting if able.  Length of Stay: 55  Current Medications: Scheduled Meds:   (feeding supplement) PROSource Plus  30 mL Oral BID BM   Chlorhexidine Gluconate Cloth  6 each Topical Q0600   enoxaparin (LOVENOX) injection  50 mg Subcutaneous Q12H   metoCLOPramide  5 mg Oral TID AC & HS   metoprolol tartrate  100 mg Oral BID   multivitamin with minerals  1 tablet Oral Daily   pantoprazole  40 mg Oral BID   sodium  chloride flush  5 mL Intracatheter Q8H   sucralfate  1 g Oral TID    Continuous Infusions:  TPN CYCLIC-ADULT (ION)      PRN Meds: acetaminophen, haloperidol lactate, ipratropium-albuterol, ondansetron (ZOFRAN) IV, mouth rinse, prochlorperazine **OR** prochlorperazine, sodium chloride flush  Physical Exam Vitals and nursing note reviewed.  Constitutional:      General: She is not in acute distress.    Appearance: She is ill-appearing.  Pulmonary:     Effort: No respiratory distress.  Skin:    General: Skin is warm and dry.  Neurological:     Mental Status: She is alert and oriented to person, place, and time.     Motor: Weakness present.     Comments: Asleep  Psychiatric:        Attention and Perception: Attention normal.        Behavior: Behavior is slowed. Behavior is cooperative.        Cognition and Memory: Cognition and memory normal.             Vital Signs: BP (!) 151/75   Pulse 86   Temp 98.6 F (37 C) (Axillary)   Resp 20   Ht 4\' 10"  (1.473 m)   Wt 53.8 kg   SpO2 96%   BMI 24.77 kg/m  SpO2: SpO2: 96 % O2 Device: O2 Device: Room Air O2 Flow Rate: O2 Flow Rate (L/min): 2 L/min  Intake/output summary: No intake or output data in the 24 hours ending 01/17/24 1219 LBM: Last BM Date : 01/12/24 Baseline Weight: Weight: 55.3 kg Most recent weight: Weight: 53.8 kg       Palliative  Assessment/Data: PPS       Patient Active Problem List   Diagnosis Date Noted   Protein-calorie malnutrition, severe 12/01/2023   Duodenal adenoma 11/23/2023   Upper GI bleed 11/08/2023   Hyponatremia 11/07/2023   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   Positive fecal occult blood test 10/26/2023   Duodenal mass 10/25/2023   Symptomatic anemia 10/24/2023   Nonrheumatic aortic valve stenosis 07/30/2022   Abnormal stress test 07/30/2022   Pyelonephritis 09/19/2017   Essential hypertension 09/19/2017   Hyperlipidemia 09/19/2017   Pulmonary vascular congestion 09/19/2017   Cameron ulcer 10/05/2013   Iron deficiency anemia due to chronic blood loss 10/04/2013   Other specified gastritis without mention of hemorrhage 10/04/2013   Chest tightness 10/03/2013   Vitamin D deficiency 04/08/2009   GERD 03/26/2009   Diverticulosis of colon 03/26/2009   DIARRHEA 03/26/2009   NEPHROLITHIASIS, HX OF 03/26/2009    Palliative Care Assessment & Plan   Patient Profile: 88 y.o. female  with past medical history of chronic blood loss anemia, aortic stenosis, HTN, HLD, GERD, diverticular disease, and recent diagnosis of duodenal mass suspected to be invasive cancer with plans for a Whipple procedure admitted on 11/23/2023 with shortness of breath with little exertion and lower extremity edema.    Patient had Whipple Procedure 11/23/2023 (POD 40). Final pathology - adenocarcinoma duodenum, positive margin, positive LN - pT3pN2cM0. Since then she has had delayed gastric emptying and a pancreatic leak. She currently has a NG tube in place. She is on TPN.  Assessment: Principal Problem:   Duodenal adenoma Active Problems:   Protein-calorie malnutrition, severe   Concern about end of life  Recommendations/Plan: Continue full scope plan of care Continue DNR/DNI as previously documented Patient requesting meeting with herself, Christine Morse, and PMT for ongoing GOC. Attempted to call Christine Morse  today without success - will continue attempts  to reach her to schedule meeting PMT will continue to follow and support holistically  Goals of Care and Additional Recommendations: Limitations on Scope of Treatment: Full Scope Treatment  Code Status:    Code Status Orders  (From admission, onward)           Start     Ordered   01/04/24 1259  Do not attempt resuscitation (DNR)- Limited -Do Not Intubate (DNI)  (Code Status)  Continuous       Question Answer Comment  If pulseless and not breathing No CPR or chest compressions.   In Pre-Arrest Conditions (Patient Is Breathing and Has A Pulse) Do not intubate. Provide all appropriate non-invasive medical interventions. Avoid ICU transfer unless indicated or required.   Consent: Discussion documented in EHR or advanced directives reviewed      01/04/24 1259           Code Status History     Date Active Date Inactive Code Status Order ID Comments User Context   11/23/2023 1654 01/04/2024 1259 Full Code [id number]  Almond Lint, MD Inpatient   11/07/2023 2346 11/08/2023 1709 Full Code [id number]  Rometta Emery, MD ED   10/24/2023 0232 10/27/2023 1959 Full Code [id number]  Darlin Drop, DO ED   09/19/2017 0332 09/20/2017 1616 Full Code [id number]  Bobette Mo, MD ED   10/04/2013 0102 10/05/2013 1249 Full Code [id number]  Hillary Bow, DO Inpatient      Advance Directive Documentation    Flowsheet Row Most Recent Value  Type of Advance Directive Healthcare Power of Attorney, Living will  Pre-existing out of facility DNR order (yellow form or pink MOST form) --  "MOST" Form in Place? --       Prognosis:  Poor if continues with minimal oral intake  Discharge Planning: To Be Determined  Care plan was discussed with primary RN, patient  Thank you for allowing the Palliative Medicine Team to assist in the care of this patient.   Total Time 35 minutes Prolonged Time Billed  no       Haskel Khan, NP  Please  contact Palliative Medicine Team phone at [phone] for questions and concerns.   *Portions of this note are a verbal dictation therefore any spelling and/or grammatical errors are due to the "[employer] One" system interpretation.

## 2024-01-17 NOTE — Plan of Care (Signed)
  Problem: Education: Goal: Knowledge of General Education information will improve Description: Including pain rating scale, medication(s)/side effects and non-pharmacologic comfort measures Outcome: Progressing   Problem: Clinical Measurements: Goal: Will remain free from infection Outcome: Progressing Goal: Cardiovascular complication will be avoided Outcome: Progressing   Problem: Activity: Goal: Risk for activity intolerance will decrease Outcome: Progressing   Problem: Coping: Goal: Level of anxiety will decrease Outcome: Progressing   Problem: Pain Management: Goal: General experience of comfort will improve Outcome: Progressing   Problem: Safety: Goal: Ability to remain free from injury will improve Outcome: Progressing   Problem: Skin Integrity: Goal: Risk for impaired skin integrity will decrease Outcome: Progressing

## 2024-01-17 NOTE — Progress Notes (Signed)
PT Cancellation Note  Patient Details Name: Christine Morse MRN: [id number] DOB: 03/12/1936   Cancelled Treatment:    Reason Eval/Treat Not Completed: Fatigue/lethargy limiting ability to participate; fatigued and wanting to take a bath and go to bed.  Nursing made aware.  Will follow up.   [name] 01/17/2024, 5:01 PM Sheran Lawless, PT Acute Rehabilitation Services Office:[phone] 01/17/2024

## 2024-01-17 NOTE — Progress Notes (Signed)
55 Days Post-Op   Subjective/Chief Complaint: Nursing note with some "bile" in oropharynx (note states pa notified but here is no pa at that time), patient states did fine, no n/v, having bowel function, no appetite   Objective: Vital signs in last 24 hours: Temp:  [97.7 F (36.5 C)-98.7 F (37.1 C)] 97.9 F (36.6 C) (01/27 0353) Pulse Rate:  [73-91] 73 (01/27 0353) Resp:  [20-22] 20 (01/27 0353) BP: (134-174)/(62-83) 163/69 (01/27 0353) SpO2:  [90 %-96 %] 96 % (01/27 0353) Last BM Date : 01/12/24  Intake/Output from previous day: No intake/output data recorded. Intake/Output this shift: No intake/output data recorded.  General appearance: in chair NAD Resp: effort normal GI: soft, non distended. No visible drainage from drain sites. G tube in place clamped, incision clean   Lab Results:  Recent Labs    01/15/24 0452  WBC 6.5  HGB 9.9*  HCT 32.2*  PLT 373   BMET Recent Labs    01/15/24 0830 01/16/24 0623  NA 151* 148*  K 3.8 4.1  CL 113* 111  CO2 29 28  GLUCOSE 117* 175*  BUN 24* 26*  CREATININE 0.54 0.41*  CALCIUM 8.6* 8.5*   PT/INR No results for input(s): "LABPROT", "INR" in the last 72 hours. ABG No results for input(s): "PHART", "HCO3" in the last 72 hours.  Invalid input(s): "PCO2", "PO2"  Studies/Results: No results found.  Anti-infectives: Anti-infectives (From admission, onward)    Start     Dose/Rate Route Frequency Ordered Stop   01/13/24 1630  cefTRIAXone (ROCEPHIN) 2 g in sodium chloride 0.9 % 100 mL IVPB  Status:  Discontinued       Note to Pharmacy: Pharmacy may adjust dosing strength / duration / interval for maximal efficacy   2 g 200 mL/hr over 30 Minutes Intravenous Every 24 hours 01/13/24 0923 01/13/24 1309   01/13/24 1630  metroNIDAZOLE (FLAGYL) IVPB 500 mg  Status:  Discontinued        500 mg 100 mL/hr over 60 Minutes Intravenous Every 12 hours 01/13/24 0923 01/13/24 1309   12/29/23 1630  cefTRIAXone (ROCEPHIN) 2 g in  sodium chloride 0.9 % 100 mL IVPB  Status:  Discontinued       Note to Pharmacy: Pharmacy may adjust dosing strength / duration / interval for maximal efficacy   2 g 200 mL/hr over 30 Minutes Intravenous Every 24 hours 12/29/23 1536 01/13/24 0923   12/29/23 1630  metroNIDAZOLE (FLAGYL) IVPB 500 mg  Status:  Discontinued        500 mg 100 mL/hr over 60 Minutes Intravenous Every 12 hours 12/29/23 1536 01/13/24 0923   12/24/23 1000  piperacillin-tazobactam (ZOSYN) IVPB 3.375 g  Status:  Discontinued        3.375 g 12.5 mL/hr over 240 Minutes Intravenous Every 8 hours 12/24/23 0913 12/29/23 1536   12/13/23 1700  piperacillin-tazobactam (ZOSYN) IVPB 3.375 g        3.375 g 12.5 mL/hr over 240 Minutes Intravenous Every 8 hours 12/13/23 1412 12/13/23 2139   11/30/23 1230  fluconazole (DIFLUCAN) IVPB 400 mg  Status:  Discontinued        400 mg 100 mL/hr over 120 Minutes Intravenous Every 24 hours 11/30/23 1135 12/28/23 1321   11/26/23 0945  piperacillin-tazobactam (ZOSYN) IVPB 3.375 g  Status:  Discontinued        3.375 g 12.5 mL/hr over 240 Minutes Intravenous Every 8 hours 11/26/23 0853 12/13/23 1412   11/23/23 2100  ceFAZolin (ANCEF) IVPB 2g/100 mL premix  2 g 200 mL/hr over 30 Minutes Intravenous Every 8 hours 11/23/23 1654 11/23/23 2214   11/23/23 0700  ceFAZolin (ANCEF) IVPB 2g/100 mL premix        2 g 200 mL/hr over 30 Minutes Intravenous On call to O.R. 11/23/23 0654 11/23/23 1249       Assessment/Plan: POD 55 Whipple  Final pathology - adenocarcinoma duodenum, positive margin, positive LN - [id number]   Abdominal fluid collection - CT aspiration and drain placement 12/13 (anterior hepatic fluid collection) - serosang. Rare candida seen on final culture. Completed Zosyn. Fluc stopped as repeat cx negative for candida. Repeat CT 1/2 with abdominal wall collection now s/p IR aspiration 1/3 and bedside I&D 1/4.  Zosyn restarted 1/3 - stopped 1/8 and switched to ceftriaxone/flagyl  based on cx of citrobacter and b frag which has been completed Pancreatic leak -  All drains out now.  Delayed gastric emptying. Continue reglan can try orally today per note from this weekend TPN. G tube placed.  Continue g tube clamping and fulls today. Did not eat much yesterday due to appetite HOCM - Continue beta blockade and treat hypertension. IV metoprolol q4h per Cardiology.   Anemia of chronic disease, chronic blood loss anemia, and acute blood loss anemia- S/p 1U PRBC 12/27.  Hyperglycemia  Hypernatremia- improved, follow Severe protein calorie malnutrition - TNA.  RUE DVT - treatment dose lovenox.  New picc placed opposite side.   OOB, IS, PT consult.  Thrush - nystatin suspension   FEN - TPN. Cont PPI BID & Carafate via tube. G tube clamping. Continue full liquids today. G tube back to gravity for n/v. Still with some possible reflux per nursing and note eating will keep at fulls.  VTE - lovenox treatment dose - cont. Right sided picc out.   ID - Fluconazole- stopped 1/7, resumed Zosyn 1/3 >> afebrile with normal WBCs.  Switched to ceftriaxone and flagyl 1/8. Off all abx now   Dispo: Panc leak appears nearly resolved. Delayed gastric emptying also appears to be resolving.  Antibiotics off.  Still on TPN. For dispo would mean TPN, dressing change/flush g tube, and slow advance of diet/calorie counts in conjunction with tpn wean.        Christine Morse 01/17/2024

## 2024-01-17 NOTE — Progress Notes (Addendum)
Nutrition Follow-up  DOCUMENTATION CODES:   Severe malnutrition in context of acute illness/injury  INTERVENTION:   - Diet advancement per MD  - TPN per pharmacy  - Daily weights  - Multivitamin with minerals daily  - Prosource Plus 30 mL BID, each supplement provides 100 kcal and 15 gm protein - Magic cup TID with meals, each supplement provides 290 kcal and 9 grams of protein - Discontinue Ensure Enlive due to patients preference   NUTRITION DIAGNOSIS:   Severe Malnutrition (in the context of acute illness) related to  (inadequate energy intake) as evidenced by energy intake < or equal to 50% for > or equal to 5 days, moderate fat depletion, moderate muscle depletion.  - Still applicable   GOAL:   Patient will meet greater than or equal to 90% of their needs  - Meeting via TPN  MONITOR:   PO intake, Supplement acceptance, Labs, Weight trends  REASON FOR ASSESSMENT:   Consult Assessment of nutrition requirement/status  ASSESSMENT:   Pt with hx of HTN, HLD, GERD, and diverticulosis presented for planned surgery after a large duodenal mass was seen on imaging at admission in November and was presumed to be malignant. Underwent pancreaticoduodenectomy and placement of pancreatic duct stent and confirmed tohave adenocarcinoma duodenum.  12/3 - Op, whipple procedure and placement of pancreatic duct stent.  12/10 - Soft diet  12/11 CT: possible post-op seroma or hematoma vs abscess, distal colonic diverticulosis  12/12 - TPN started,  12/13 - CT aspiration and drain placement, NPO 12/15 - NG inserted for persistent emesis  12/18- venous duplex positive for VTE; started on treatment dose lovenox 12/20- NGT clamping trials 12/21- NGT d/c, advanced to clear liquids 12/22- advanced to full liquids 12/23 - clear liquids 12/24 - NPO 12/25 NGT replaced 1/5-1/6 - Pt pulled PICC line, TPN stopped, replaced 1/6 1/10 - Pt pulled NGT, Right drain removed  1/13 - NGT replaced   1/15 - IR placement of PEG tube-for drainage  1/21 - Tolerated 4 hours G tube clamping 1/23 - Advanced to clears 1/24 - FLD started    Pancreatic leak and delayed gastric emptying is resolving.  Ongoing G-tube clamping trials, she did not eat much yesterday due to appetite. G-tube back to gravity yesterday due to N/V, patient vomited bright green bile 1/26.   Patient did not have breakfast on visit, said she had "somewhat of an appetite, but not for that" and pointed at the tray. Went over tray items and she was willing to try grits, greek yogurt, orange sherbet, and the Magic cup. Was taking bites of each when I left. Encouraged patient to try and increase her PO intake. Has been refusing Ensures, does like Ensures or Boost,  willing to try Prosource Plus. Likes the Magic cups. Denies any N/V. Having minimal intake. Per MD goal is to slowly advance diet and conduct calorie counts in conjunction with tpn wean.   TPN continuing, providing 1714 kcal and 99 gm protein, meeting 100% of nutritional needs.  Last BM 1/23. Weight was trending down during admission, increased energy needs on 1/3 and then again on 1/7. Weight is now stable.   Admit weight: 55.3 kg Current weight: 51.1 kg    No intake or output data in the 24 hours ending 01/17/24 0833  Drains/Lines: PEG: 850 ml x 24 hours   Average Meal Intake: No meals recorded   Nutritionally Relevant Medications: Scheduled Meds:  Chlorhexidine Gluconate Cloth  6 each Topical Q0600   enoxaparin (  LOVENOX) injection  50 mg Subcutaneous Q12H   feeding supplement  237 mL Oral TID BM   metoCLOPramide  5 mg Oral TID AC & HS   metoprolol tartrate  100 mg Oral BID   multivitamin with minerals  1 tablet Oral Daily   pantoprazole  40 mg Oral BID   sodium chloride flush  5 mL Intracatheter Q8H   sucralfate  1 g Oral TID   Continuous Infusions:  TPN CYCLIC-ADULT (ION)     Labs Reviewed: BUN 30, Calcium 8.7,  No recent CBG HgbA1c HgbA1c 3.7  (10/2023)  Diet Order:   Diet Order             Diet full liquid Room service appropriate? Yes; Fluid consistency: Thin  Diet effective now                   EDUCATION NEEDS:   Education needs have been addressed  Skin:  Skin Assessment: Skin Integrity Issues: Skin Integrity Issues:: Incisions Incisions: Closed Abdomen  Last BM:  01/13/24  Height:   Ht Readings from Last 1 Encounters:  11/23/23 4\' 10"  (1.473 m)    Weight:   Wt Readings from Last 1 Encounters:  01/11/24 51.1 kg    Ideal Body Weight:  43.9 kg  BMI:  Body mass index is 23.53 kg/m.  Estimated Nutritional Needs:   Kcal:  1700-2000 kcal  Protein:  85-100 gm  Fluid:  1 ml/kcal   Elliot Dally, RD Registered Dietitian  See Amion for more information

## 2024-01-17 NOTE — Progress Notes (Signed)
PHARMACY - TOTAL PARENTERAL NUTRITION CONSULT NOTE  Indication:  delayed gastric emptying  Patient Measurements: Height: 4\' 10"  (147.3 cm) Weight: 51.1 kg (112 lb 9.6 oz) IBW/kg (Calculated) : 40.9 TPN AdjBW (KG): 44.5 Body mass index is 23.53 kg/m. 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  Patient was started on a CLD on 12/6-12/9 and then advanced to a soft diet since 12/10.  Patient has had minimal PO intake since being post-op and continues to experience nausea. Delayed gastric emptying likely related to possible pancreatic leak.  Pharmacy consulted to dose TPN.  Glucose / Insulin: A1c 3.7% (11/15/23) - glucose 207 on AM labs, off SSI Electrolytes: all WNL Renal: SCr < 1, BUN 30s Hepatic: AST/ALT slightly elevated, albumin 2.7, alk phos / tbili / TG WNL Intake / Output; MIVF: Reglan, sucralfate; G tube to gravity-32mL charted, emesis x1 on 1/26, LBM 1/23. Net I/O not accurate given unmeasured UOP.  GI Imaging: 12/7 XR: unremarkable bowel gas pattern. 12/11 CT: possible post-op seroma or hematoma vs abscess, distal colonic diverticulosis 12/15 XR: expected post-op changes, NGT in correct place 12/26 CT: perc drain cath adj left lobe liver, no discernable residual fluid collection. Post whipple w/ no evidence pancreatic ductal dilatation or peripancreatic fluid collection, small amount of free fluid in pelvis 1/2 CT - minimal fluid near drain in abdomen   GI Surgeries / Procedures:  12/3 diagnostic laparoscopy, Whipple procedure, pancreatic duct stent 12/13 subhepatic fluid collection aspiration and drainage 1/3 IR US drain placement of abdominal abscess 1/15 IR placement gastrostomy tube   Central access: PICC replaced 12/27/23 TPN start date: 12/02/23  Nutritional Goals: RD Estimated Needs Total Energy Estimated Needs: 1700-2000  kcal Total Protein Estimated Needs: 85-100 gm Total Fluid Estimated Needs: 1 ml/kcal  Current Nutrition:  TPN >> cycled 1/14  FLD started 1/24 - minimal intake  Plan:  Continue cyclic TPN over 14 hours (taper up/down 1 hr; GIR 3-6 g/kg/min), provides 1714 kCal and 99g AA, meeting 100% of nutritional needs.  Electrolytes in TPN: Na 50 mEq/L, K 60 mEq/L (= 102 mEq), Ca 3 mEq/L, Mg 7 mEq/L, Phos 15 mmol/L, Cl:Ac 1:2.  PO MVI and trace elements  Monitor AM glucose - if it trends up further, consider restarting SSI Monitor TPN labs on Mon/Thurs and PRN F/u PO intake to wean TPN as able   Aleiyah Halpin D. Laney Potash, PharmD, BCPS, BCCCP 01/17/2024, 7:49 AM

## 2024-01-18 ENCOUNTER — Inpatient Hospital Stay (HOSPITAL_COMMUNITY): Payer: Medicare Other

## 2024-01-18 DIAGNOSIS — I517 Cardiomegaly: Secondary | ICD-10-CM | POA: Diagnosis not present

## 2024-01-18 DIAGNOSIS — K632 Fistula of intestine: Secondary | ICD-10-CM | POA: Diagnosis not present

## 2024-01-18 DIAGNOSIS — R092 Respiratory arrest: Secondary | ICD-10-CM | POA: Diagnosis not present

## 2024-01-18 DIAGNOSIS — T8189XA Other complications of procedures, not elsewhere classified, initial encounter: Secondary | ICD-10-CM | POA: Diagnosis not present

## 2024-01-18 DIAGNOSIS — L89626 Pressure-induced deep tissue damage of left heel: Secondary | ICD-10-CM | POA: Diagnosis not present

## 2024-01-18 DIAGNOSIS — Z431 Encounter for attention to gastrostomy: Secondary | ICD-10-CM | POA: Diagnosis not present

## 2024-01-18 DIAGNOSIS — Z711 Person with feared health complaint in whom no diagnosis is made: Secondary | ICD-10-CM | POA: Diagnosis not present

## 2024-01-18 DIAGNOSIS — R1312 Dysphagia, oropharyngeal phase: Secondary | ICD-10-CM | POA: Diagnosis not present

## 2024-01-18 DIAGNOSIS — Z6823 Body mass index (BMI) 23.0-23.9, adult: Secondary | ICD-10-CM | POA: Diagnosis not present

## 2024-01-18 DIAGNOSIS — Z515 Encounter for palliative care: Secondary | ICD-10-CM | POA: Diagnosis not present

## 2024-01-18 DIAGNOSIS — Z9911 Dependence on respirator [ventilator] status: Secondary | ICD-10-CM | POA: Diagnosis not present

## 2024-01-18 DIAGNOSIS — A419 Sepsis, unspecified organism: Secondary | ICD-10-CM | POA: Diagnosis not present

## 2024-01-18 DIAGNOSIS — I82621 Acute embolism and thrombosis of deep veins of right upper extremity: Secondary | ICD-10-CM

## 2024-01-18 DIAGNOSIS — T884XXA Failed or difficult intubation, initial encounter: Secondary | ICD-10-CM | POA: Diagnosis not present

## 2024-01-18 DIAGNOSIS — R0989 Other specified symptoms and signs involving the circulatory and respiratory systems: Secondary | ICD-10-CM | POA: Diagnosis not present

## 2024-01-18 DIAGNOSIS — K651 Peritoneal abscess: Secondary | ICD-10-CM

## 2024-01-18 DIAGNOSIS — R4182 Altered mental status, unspecified: Secondary | ICD-10-CM | POA: Diagnosis not present

## 2024-01-18 DIAGNOSIS — E43 Unspecified severe protein-calorie malnutrition: Secondary | ICD-10-CM | POA: Diagnosis not present

## 2024-01-18 DIAGNOSIS — R112 Nausea with vomiting, unspecified: Secondary | ICD-10-CM | POA: Diagnosis not present

## 2024-01-18 DIAGNOSIS — R29898 Other symptoms and signs involving the musculoskeletal system: Secondary | ICD-10-CM

## 2024-01-18 DIAGNOSIS — J9601 Acute respiratory failure with hypoxia: Secondary | ICD-10-CM | POA: Diagnosis not present

## 2024-01-18 DIAGNOSIS — K8689 Other specified diseases of pancreas: Secondary | ICD-10-CM

## 2024-01-18 DIAGNOSIS — I350 Nonrheumatic aortic (valve) stenosis: Secondary | ICD-10-CM | POA: Diagnosis not present

## 2024-01-18 DIAGNOSIS — C170 Malignant neoplasm of duodenum: Secondary | ICD-10-CM | POA: Diagnosis not present

## 2024-01-18 DIAGNOSIS — L89616 Pressure-induced deep tissue damage of right heel: Secondary | ICD-10-CM | POA: Diagnosis not present

## 2024-01-18 DIAGNOSIS — M6281 Muscle weakness (generalized): Secondary | ICD-10-CM | POA: Diagnosis not present

## 2024-01-18 DIAGNOSIS — R531 Weakness: Secondary | ICD-10-CM | POA: Diagnosis not present

## 2024-01-18 DIAGNOSIS — R079 Chest pain, unspecified: Secondary | ICD-10-CM | POA: Diagnosis not present

## 2024-01-18 DIAGNOSIS — K560 Paralytic ileus: Secondary | ICD-10-CM | POA: Diagnosis not present

## 2024-01-18 DIAGNOSIS — R739 Hyperglycemia, unspecified: Secondary | ICD-10-CM

## 2024-01-18 DIAGNOSIS — R638 Other symptoms and signs concerning food and fluid intake: Secondary | ICD-10-CM | POA: Diagnosis not present

## 2024-01-18 DIAGNOSIS — Z452 Encounter for adjustment and management of vascular access device: Secondary | ICD-10-CM | POA: Diagnosis not present

## 2024-01-18 DIAGNOSIS — R1311 Dysphagia, oral phase: Secondary | ICD-10-CM | POA: Diagnosis not present

## 2024-01-18 DIAGNOSIS — Z66 Do not resuscitate: Secondary | ICD-10-CM | POA: Diagnosis not present

## 2024-01-18 DIAGNOSIS — D132 Benign neoplasm of duodenum: Secondary | ICD-10-CM | POA: Diagnosis not present

## 2024-01-18 DIAGNOSIS — J189 Pneumonia, unspecified organism: Secondary | ICD-10-CM | POA: Diagnosis not present

## 2024-01-18 DIAGNOSIS — M7989 Other specified soft tissue disorders: Secondary | ICD-10-CM

## 2024-01-18 DIAGNOSIS — J9621 Acute and chronic respiratory failure with hypoxia: Secondary | ICD-10-CM | POA: Diagnosis not present

## 2024-01-18 DIAGNOSIS — I421 Obstructive hypertrophic cardiomyopathy: Secondary | ICD-10-CM

## 2024-01-18 DIAGNOSIS — T8143XA Infection following a procedure, organ and space surgical site, initial encounter: Secondary | ICD-10-CM

## 2024-01-18 DIAGNOSIS — K30 Functional dyspepsia: Secondary | ICD-10-CM

## 2024-01-18 DIAGNOSIS — R6521 Severe sepsis with septic shock: Secondary | ICD-10-CM | POA: Diagnosis not present

## 2024-01-18 DIAGNOSIS — L0291 Cutaneous abscess, unspecified: Secondary | ICD-10-CM | POA: Diagnosis not present

## 2024-01-18 DIAGNOSIS — Z789 Other specified health status: Secondary | ICD-10-CM | POA: Diagnosis not present

## 2024-01-18 DIAGNOSIS — E46 Unspecified protein-calorie malnutrition: Secondary | ICD-10-CM | POA: Diagnosis not present

## 2024-01-18 DIAGNOSIS — G931 Anoxic brain damage, not elsewhere classified: Secondary | ICD-10-CM | POA: Diagnosis not present

## 2024-01-18 DIAGNOSIS — Z7189 Other specified counseling: Secondary | ICD-10-CM | POA: Diagnosis not present

## 2024-01-18 DIAGNOSIS — I2510 Atherosclerotic heart disease of native coronary artery without angina pectoris: Secondary | ICD-10-CM | POA: Diagnosis not present

## 2024-01-18 DIAGNOSIS — I119 Hypertensive heart disease without heart failure: Secondary | ICD-10-CM | POA: Diagnosis not present

## 2024-01-18 DIAGNOSIS — D500 Iron deficiency anemia secondary to blood loss (chronic): Secondary | ICD-10-CM | POA: Diagnosis not present

## 2024-01-18 DIAGNOSIS — R5381 Other malaise: Secondary | ICD-10-CM | POA: Diagnosis not present

## 2024-01-18 DIAGNOSIS — E8721 Acute metabolic acidosis: Secondary | ICD-10-CM | POA: Diagnosis not present

## 2024-01-18 DIAGNOSIS — R5383 Other fatigue: Secondary | ICD-10-CM | POA: Diagnosis not present

## 2024-01-18 DIAGNOSIS — I69319 Unspecified symptoms and signs involving cognitive functions following cerebral infarction: Secondary | ICD-10-CM | POA: Diagnosis not present

## 2024-01-18 DIAGNOSIS — K630 Abscess of intestine: Secondary | ICD-10-CM | POA: Diagnosis not present

## 2024-01-18 DIAGNOSIS — I639 Cerebral infarction, unspecified: Secondary | ICD-10-CM | POA: Diagnosis not present

## 2024-01-18 DIAGNOSIS — E872 Acidosis, unspecified: Secondary | ICD-10-CM | POA: Diagnosis not present

## 2024-01-18 DIAGNOSIS — J690 Pneumonitis due to inhalation of food and vomit: Secondary | ICD-10-CM | POA: Diagnosis not present

## 2024-01-18 DIAGNOSIS — B370 Candidal stomatitis: Secondary | ICD-10-CM

## 2024-01-18 DIAGNOSIS — Z7401 Bed confinement status: Secondary | ICD-10-CM | POA: Diagnosis not present

## 2024-01-18 MED ORDER — MEGESTROL ACETATE 400 MG/10ML PO SUSP
400.0000 mg | Freq: Every day | ORAL | Status: DC
  Administered 2024-01-18: 400 mg
  Filled 2024-01-18 (×2): qty 10

## 2024-01-18 MED ORDER — IPRATROPIUM-ALBUTEROL 0.5-2.5 (3) MG/3ML IN SOLN: 3.0000 mL | Freq: Four times a day (QID) | RESPIRATORY_TRACT | Status: AC | PRN

## 2024-01-18 MED ORDER — ONDANSETRON HCL 4 MG PO TABS: 4.0000 mg | ORAL_TABLET | Freq: Three times a day (TID) | ORAL | 0 refills | Status: AC | PRN

## 2024-01-18 MED ORDER — MEGESTROL ACETATE 400 MG/10ML PO SUSP: 400.0000 mg | Freq: Every day | ORAL | 0 refills | Status: AC

## 2024-01-18 MED ORDER — NYSTATIN 100000 UNIT/ML MT SUSP
5.0000 mL | Freq: Four times a day (QID) | OROMUCOSAL | Status: DC
  Administered 2024-01-18 (×2): 500000 [IU] via ORAL
  Filled 2024-01-18 (×2): qty 5

## 2024-01-18 MED ORDER — PROCHLORPERAZINE MALEATE 10 MG PO TABS: 10.0000 mg | ORAL_TABLET | Freq: Four times a day (QID) | ORAL | 0 refills | Status: AC | PRN

## 2024-01-18 MED ORDER — NYSTATIN 100000 UNIT/ML MT SUSP: 5.0000 mL | Freq: Four times a day (QID) | OROMUCOSAL | 0 refills | Status: AC

## 2024-01-18 MED ORDER — ADULT MULTIVITAMIN W/MINERALS CH: 1.0000 | ORAL_TABLET | Freq: Every day | ORAL | Status: AC

## 2024-01-18 MED ORDER — METOCLOPRAMIDE HCL 5 MG PO TABS: 5.0000 mg | ORAL_TABLET | Freq: Three times a day (TID) | ORAL | Status: AC

## 2024-01-18 MED ORDER — METOPROLOL TARTRATE 100 MG PO TABS: 100.0000 mg | ORAL_TABLET | Freq: Two times a day (BID) | ORAL | 0 refills | Status: AC

## 2024-01-18 MED ORDER — PROSOURCE PLUS PO LIQD: 30.0000 mL | Freq: Two times a day (BID) | ORAL | Status: AC

## 2024-01-18 MED ORDER — SUCRALFATE 1 GM/10ML PO SUSP: 1.0000 g | Freq: Three times a day (TID) | ORAL | 0 refills | Status: AC

## 2024-01-18 MED ORDER — STERILE WATER FOR INJECTION IV SOLN
INTRAMUSCULAR | Status: DC
  Filled 2024-01-18: qty 986

## 2024-01-18 MED ORDER — ENOXAPARIN SODIUM 60 MG/0.6ML IJ SOSY: 50.0000 mg | PREFILLED_SYRINGE | Freq: Two times a day (BID) | INTRAMUSCULAR | Status: AC

## 2024-01-18 NOTE — Discharge Instructions (Signed)
[facility]      [facility], [place] [phone]  ABDOMINAL SURGERY: POST OP INSTRUCTIONS  Always review your discharge instruction sheet given to you by the facility where your surgery was performed.  IF YOU HAVE DISABILITY OR FAMILY LEAVE FORMS, YOU MUST BRING THEM TO THE OFFICE FOR PROCESSING.  PLEASE DO NOT GIVE THEM TO YOUR DOCTOR.  A prescription for pain medication may be given to you upon discharge.  Take your pain medication as prescribed, if needed.  If narcotic pain medicine is not needed, then you may take acetaminophen (Tylenol) or ibuprofen (Advil) as needed. Take your usually prescribed medications unless otherwise directed. If you need a refill on your pain medication, please contact your pharmacy. They will contact our office to request authorization.  Prescriptions will not be filled after 5pm or on week-ends. You should follow a light diet the first few days after arrival home, such as soup and crackers, pudding, etc.unless your doctor has advised otherwise. A high-fiber, low fat diet can be resumed as tolerated.   Be sure to include lots of fluids daily. Most patients will experience some swelling and bruising on the chest and neck area.  Ice packs will help.  Swelling and bruising can take several days to resolve Most patients will experience some swelling and bruising in the area of the incision. Ice pack will help. Swelling and bruising can take several days to resolve..  It is common to experience some constipation if taking pain medication after surgery.  Increasing fluid intake and taking a stool softener will usually help or prevent this problem from occurring.  A mild laxative (Milk of Magnesia or Miralax) should be taken according to package directions if there are no bowel movements after 48 hours.  You may have steri-strips (small skin tapes) in place directly over the incision.  These strips should be left on the skin for 10-14 days.  If your surgeon used skin glue on  the incision, you may shower in 48 hours.  The glue will flake off over the next 2-3 weeks.  Any sutures or staples will be removed at the office during your follow-up visit. You may find that a light gauze bandage over your incision may keep your staples from being rubbed or pulled. You may shower and replace the bandage daily. ACTIVITIES:  You may resume regular (light) daily activities beginning the next day--such as daily self-care, walking, climbing stairs--gradually increasing activities as tolerated.  You may have sexual intercourse when it is comfortable.  Refrain from any heavy lifting or straining until approved by your doctor. You may drive when you no longer are taking prescription pain medication, you can comfortably wear a seatbelt, and you can safely maneuver your car and apply brakes Return to Work: __________8 weeks if applicable_________________________ Christine Morse should see your doctor in the office for a follow-up appointment approximately two weeks after your surgery.  Make sure that you call for this appointment within a day or two after you arrive home to insure a convenient appointment time. OTHER INSTRUCTIONS:  _____________________________________________________________ _____________________________________________________________  WHEN TO CALL YOUR DOCTOR: Fever over 101.0 Inability to urinate Nausea and/or vomiting Extreme swelling or bruising Continued bleeding from incision. Increased pain, redness, or drainage from the incision. Difficulty swallowing or breathing Muscle cramping or spasms. Numbness or tingling in hands or feet or around lips.  The clinic staff is available to answer your questions during regular business hours.  Please don't hesitate to call and ask to speak to  one of the nurses if you have concerns.  For further questions, please visit [email or web address]

## 2024-01-18 NOTE — TOC Transition Note (Signed)
Transition of Care (TOC) - Discharge Note Donn Pierini RN,BSN Transitions of Care Unit 4NP (Non Trauma)- RN Case Manager See Treatment Team for direct Phone #   Patient Details  Name: Christine Morse MRN: [id number] Date of Birth: 06-17-1936  Transition of Care [facility]) CM/SW Contact:  Darrold Span, RN Phone Number: 01/18/2024, 3:00 PM   Clinical Narrative:    CM follow up with Dr. Donell Beers regarding LTACH, verbal given for [facility] referral as pt as ongoing acute needs- CM reached out to both [facility] liaisons- per [facility] they are unable to offer bed, [facility] voiced pt meets their criteria for admit and they are offering bed available today.  CM spoke with pt and both daughters Christine Morse and Christine Morse this am to discuss LTACH option and [facility] bed offer. After discussion with each other- daughter Christine Morse informed this Clinical research associate that they wanted to move forward with LTACH and are agreeable to accept [facility] bed offer.  Dr. Donell Beers informed and has cleared pt for discharge to [facility]ACH today  Msg received from [facility]- PA that pt/family would like outpt PC follow up- [facility] liaison made aware and will have [facility] team follow up with PC needs and GOC.   Per [facility] Pt has been assigned rooom #[id number] accepting MD- Eliezer Champagne- MD to call handoff report to MD- at [phone] Dr. Donell Beers aware and to call.   RN report # to call - [phone]- report will need to be called prior to transport.   GOLD DNR and paperwork on chart  1430- [facility] called for transport- per dispatch- ETA for transport is between 4-5pm.  [facility] liaison updated.   No further TOC needs noted- pt ready for transition to [facility]. Bedside RN has been updated  Final next level of care: Long Term Acute Care (LTAC) Barriers to Discharge: Barriers Resolved   Patient Goals and CMS Choice Patient states their goals for this hospitalization and ongoing recovery are:: LTACH and home to return home CMS Medicare.gov Compare Post Acute  Care list provided to:: Patient Represenative (must comment) Choice offered to / list presented to : Patient, Adult Children [place] ownership interest in [facility].provided to:: Adult Children    Discharge Placement               [facility]        Discharge Plan and Services Additional resources added to the After Visit Summary for   In-house Referral: Clinical Social Work Discharge Planning Services: CM Consult Post Acute Care Choice: Long Term Acute Care (LTAC)                    HH Arranged: NA          Social Drivers of Health (SDOH) Interventions SDOH Screenings   Food Insecurity: No Food Insecurity (11/23/2023)  Housing: Patient Declined (11/23/2023)  Transportation Needs: No Transportation Needs (11/23/2023)  Utilities: Not At Risk (11/23/2023)  Social Connections: Unknown (12/21/2023)  Tobacco Use: Low Risk  (11/23/2023)  Recent Concern: Tobacco Use - Medium Risk (11/07/2023)     Readmission Risk Interventions    01/18/2024    3:00 PM 10/27/2023    9:03 AM  Readmission Risk Prevention Plan  Transportation Screening Complete Complete  PCP or Specialist Appt within 5-7 Days  Complete  Home Care Screening  Complete  Medication Review (RN CM)  Complete  Medication Review (RN Care Manager) Complete   HRI or Home Care Consult Complete   SW Recovery Care/Counseling Consult Complete  Palliative Care Screening Complete   Skilled Nursing Facility Not Applicable

## 2024-01-18 NOTE — Progress Notes (Signed)
56 Days Post-Op   Subjective/Chief Complaint: Tolerating some full liquids, but minimal appetite. Having some thrush again.    Objective: Vital signs in last 24 hours: Temp:  [97.9 F (36.6 C)-98.7 F (37.1 C)] 97.9 F (36.6 C) (01/28 0734) Pulse Rate:  [78-88] 81 (01/28 0805) Resp:  [18-20] 19 (01/28 0734) BP: (123-159)/(62-74) 146/62 (01/28 0805) SpO2:  [96 %-97 %] 96 % (01/28 0734) Last BM Date : 01/18/24  Intake/Output from previous day: 01/27 0701 - 01/28 0700 In: 361.2 [P.O.:237; I.V.:124.2] Out: -  Intake/Output this shift: No intake/output data recorded.  General appearance: in chair NAD, more sleepy this AM.  Resp: effort normal GI: soft, non distended. No visible drainage from drain sites. G tube in place clamped, incision clean Increased BLE edema    Lab Results:  No results for input(s): "WBC", "HGB", "HCT", "PLT" in the last 72 hours.  BMET Recent Labs    01/16/24 0623 01/17/24 0636  NA 148* 141  K 4.1 4.3  CL 111 108  CO2 28 23  GLUCOSE 175* 207*  BUN 26* 30*  CREATININE 0.41* 0.44  CALCIUM 8.5* 8.7*   PT/INR No results for input(s): "LABPROT", "INR" in the last 72 hours. ABG No results for input(s): "PHART", "HCO3" in the last 72 hours.  Invalid input(s): "PCO2", "PO2"  Studies/Results: No results found.  Anti-infectives: Anti-infectives (From admission, onward)    Start     Dose/Rate Route Frequency Ordered Stop   01/13/24 1630  cefTRIAXone (ROCEPHIN) 2 g in sodium chloride 0.9 % 100 mL IVPB  Status:  Discontinued       Note to Pharmacy: Pharmacy may adjust dosing strength / duration / interval for maximal efficacy   2 g 200 mL/hr over 30 Minutes Intravenous Every 24 hours 01/13/24 0923 01/13/24 1309   01/13/24 1630  metroNIDAZOLE (FLAGYL) IVPB 500 mg  Status:  Discontinued        500 mg 100 mL/hr over 60 Minutes Intravenous Every 12 hours 01/13/24 0923 01/13/24 1309   12/29/23 1630  cefTRIAXone (ROCEPHIN) 2 g in sodium chloride 0.9  % 100 mL IVPB  Status:  Discontinued       Note to Pharmacy: Pharmacy may adjust dosing strength / duration / interval for maximal efficacy   2 g 200 mL/hr over 30 Minutes Intravenous Every 24 hours 12/29/23 1536 01/13/24 0923   12/29/23 1630  metroNIDAZOLE (FLAGYL) IVPB 500 mg  Status:  Discontinued        500 mg 100 mL/hr over 60 Minutes Intravenous Every 12 hours 12/29/23 1536 01/13/24 0923   12/24/23 1000  piperacillin-tazobactam (ZOSYN) IVPB 3.375 g  Status:  Discontinued        3.375 g 12.5 mL/hr over 240 Minutes Intravenous Every 8 hours 12/24/23 0913 12/29/23 1536   12/13/23 1700  piperacillin-tazobactam (ZOSYN) IVPB 3.375 g        3.375 g 12.5 mL/hr over 240 Minutes Intravenous Every 8 hours 12/13/23 1412 12/13/23 2139   11/30/23 1230  fluconazole (DIFLUCAN) IVPB 400 mg  Status:  Discontinued        400 mg 100 mL/hr over 120 Minutes Intravenous Every 24 hours 11/30/23 1135 12/28/23 1321   11/26/23 0945  piperacillin-tazobactam (ZOSYN) IVPB 3.375 g  Status:  Discontinued        3.375 g 12.5 mL/hr over 240 Minutes Intravenous Every 8 hours 11/26/23 0853 12/13/23 1412   11/23/23 2100  ceFAZolin (ANCEF) IVPB 2g/100 mL premix        2  g 200 mL/hr over 30 Minutes Intravenous Every 8 hours 11/23/23 1654 11/23/23 2214   11/23/23 0700  ceFAZolin (ANCEF) IVPB 2g/100 mL premix        2 g 200 mL/hr over 30 Minutes Intravenous On call to O.R. 11/23/23 0654 11/23/23 1249       Assessment/Plan: POD 56 Whipple 11/23/2023  Final pathology - adenocarcinoma duodenum, positive margin, positive LN - [id number]   Abdominal fluid collection - CT aspiration and drain placement 12/13 (anterior hepatic fluid collection) - serosang. Rare candida seen on final culture. Completed Zosyn. Fluc stopped as repeat cx negative for candida. Repeat CT 1/2 with abdominal wall collection now s/p IR aspiration 1/3 and bedside I&D 1/4.  Zosyn restarted 1/3 - stopped 1/8 and switched to ceftriaxone/flagyl based on cx  of citrobacter and b frag which has been completed Pancreatic leak -  All drains out now.  Delayed gastric emptying. Continue reglan can try orally today per note from this weekend TPN. G tube placed.  Keep g tube clamped. Flush twice daily with water. If n/v develop, open to drain for 2 hours, then reclamp.  HOCM - Continue beta blockade and treat hypertension. IV metoprolol q4h per Cardiology.   Anemia of chronic disease, chronic blood loss anemia, and acute blood loss anemia- S/p 1U PRBC 12/27.  Hyperglycemia - SSI, needing a little more now that she can eat some.  Hypernatremia- improved, follow Severe protein calorie malnutrition - TNA. Adding megace for appetite stimulation.  RUE DVT - treatment dose lovenox.  New picc placed opposite side.  BLE edema worse, likely from hypoalbuminemia.  Check BLE venous duplex. Additional DVT would be unlikely on treatment dose lovenox.  OOB, IS, PT consult.  Thrush - nystatin suspension restarted.    FEN - TPN. Cont PPI BID & Carafate via tube. G tube clamping. Continue full liquids today. G tube back to gravity for n/v. Still with some possible reflux per nursing and note eating will keep at fulls.  VTE - lovenox treatment dose - cont. Right sided picc out.   ID - Fluconazole- stopped 1/7, resumed Zosyn 1/3 >> afebrile with normal WBCs.  Switched to ceftriaxone and flagyl 1/8. Off all abx now   Dispo: Panc leak appears resolved. Delayed gastric emptying also appears to be resolved or nearly resolved. Antibiotics off.  Still on TPN.   Main issues are severe deconditioning, protein calorie malnutrition, poor appetite.  Ok for [facility] for improvement in oral intake and PT/OT. Will need g tube care, picc care, dressing changes.        Almond Lint 01/18/2024

## 2024-01-18 NOTE — Progress Notes (Signed)
PHARMACY - TOTAL PARENTERAL NUTRITION CONSULT NOTE  Indication:  delayed gastric emptying  Patient Measurements: Height: 4\' 10"  (147.3 cm) Weight: 53.8 kg (118 lb 8 oz) IBW/kg (Calculated) : 40.9 TPN AdjBW (KG): 44.5 Body mass index is 24.77 kg/m. 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  Patient was started on a CLD on 12/6-12/9 and then advanced to a soft diet since 12/10.  Patient has had minimal PO intake since being post-op and continues to experience nausea. Delayed gastric emptying likely related to possible pancreatic leak.  Pharmacy consulted to dose TPN.  Glucose / Insulin: A1c 3.7% (11/15/23) - glucose 207 on AM labs, off SSI Electrolytes: all WNL Renal: SCr < 1, BUN 30s Hepatic: AST/ALT slightly elevated, albumin 2.7, alk phos / tbili / TG WNL Intake / Output; MIVF: Reglan, sucralfate; G tube to gravity-25mL charted, emesis x1 on 1/26, LBM 1/27. Net I/O not accurate given unmeasured UOP.  GI Imaging: 12/7 XR: unremarkable bowel gas pattern. 12/11 CT: possible post-op seroma or hematoma vs abscess, distal colonic diverticulosis 12/15 XR: expected post-op changes, NGT in correct place 12/26 CT: perc drain cath adj left lobe liver, no discernable residual fluid collection. Post whipple w/ no evidence pancreatic ductal dilatation or peripancreatic fluid collection, small amount of free fluid in pelvis 1/2 CT - minimal fluid near drain in abdomen   GI Surgeries / Procedures:  12/3 diagnostic laparoscopy, Whipple procedure, pancreatic duct stent 12/13 subhepatic fluid collection aspiration and drainage 1/3 IR US drain placement of abdominal abscess 1/15 IR placement gastrostomy tube   Central access: PICC replaced 12/27/23 TPN start date: 12/02/23  Nutritional Goals: RD Estimated Needs Total Energy Estimated Needs: 1700-2000  kcal Total Protein Estimated Needs: 85-100 gm Total Fluid Estimated Needs: 1 ml/kcal  Current Nutrition:  TPN >> cycled 1/14  FLD started 1/24 - minimal intake Prosource BID and Magic cup TIDwm  Plan:  Continue cyclic TPN over 14 hours (taper up/down 1 hr; GIR 3-6 g/kg/min), provides 1714 kCal and 99g AA, meeting 100% of nutritional needs.  Electrolytes in TPN: Na 50 mEq/L, K 60 mEq/L (= 102 mEq), Ca 3 mEq/L, Mg 7 mEq/L, Phos 15 mmol/L, Cl:Ac 1:2.  PO MVI and trace elements  Monitor AM glucose - if it trends up further, consider restarting SSI Monitor TPN labs on Mon/Thurs - BMET in AM F/u PO intake to wean TPN as able  F/u [facility]  Christine Morse D. Laney Potash, PharmD, BCPS, BCCCP 01/18/2024, 8:37 AM

## 2024-01-18 NOTE — Progress Notes (Signed)
Physical Therapy Treatment Patient Details Name: Christine Morse MRN: [id number] DOB: [birth year] Today's Date: 01/18/2024   History of Present Illness Patient is an 88 y/o female admitted 11/23/23 with diagnosis of duodenal adenoma and underwent laparoscopic Whipple procedure. post-op subcapsular fluid collection around the anterior aspect of the left liver lobe. Drain placement in IR 12/03/23. NGT inserted 12/15, dislodged then replaced.  Positive for R subclavian and axillary DVT on 12/07/23. 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    PT Comments  Pt hesitant to participate in PT, but agreeable with encouragement. Pt ambulatory for short hallway distance, cues for upright posture and placement in RW throughout. Pt overall requiring light steadying assist throughout mobility. Pt with LE swelling, encouraged gentle AROM Les to improve this. PT to continue to follow.      If plan is discharge home, recommend the following: A little help with walking and/or transfers;A little help with bathing/dressing/bathroom;Assistance with cooking/housework;Assist for transportation   Can travel by private vehicle        Equipment Recommendations  Rolling walker (2 wheels);BSC/3in1    Recommendations for Other Services       Precautions / Restrictions Precautions Precautions: Fall Precaution Comments: drains removed Restrictions Weight Bearing Restrictions Per Provider Order: No     Mobility  Bed Mobility Overal bed mobility: Needs Assistance             General bed mobility comments: up in chair    Transfers Overall transfer level: Needs assistance Equipment used: Rolling walker (2 wheels) Transfers: Sit to/from Stand Sit to Stand: Min assist           General transfer comment: slow to rise and steady, stand x2 from recliner and toilet.    Ambulation/Gait Ambulation/Gait assistance: Min assist Gait Distance (Feet): 90 Feet Assistive  device: Rolling walker (2 wheels) Gait Pattern/deviations: Step-through pattern, Decreased stride length, Trunk flexed Gait velocity: decr     General Gait Details: assist to steady and guide RW, cues for placement in RW   Stairs             Wheelchair Mobility     Tilt Bed    Modified Rankin (Stroke Patients Only)       Balance Overall balance assessment: Needs assistance Sitting-balance support: Feet supported Sitting balance-[name]: Fair     Standing balance support: Reliant on assistive device for balance Standing balance-[name]: Poor Standing balance comment: reliant on UE support                            Cognition Arousal: Alert Behavior During Therapy: WFL for tasks assessed/performed Overall Cognitive Status: Within Functional Limits for tasks assessed                               Problem Solving: Slow processing          Exercises General Exercises - Lower Extremity Ankle Circles/Pumps: AROM, Both, 15 reps, Seated Long Arc Quad: AROM, 10 reps, Both, Seated    General Comments        Pertinent Vitals/Pain Pain Assessment Pain Assessment: Faces Faces Pain Scale: Hurts a little bit Pain Location: abdomen Pain Descriptors / Indicators: Discomfort, Grimacing, Guarding Pain Intervention(s): Limited activity within patient's tolerance, Monitored during session, Repositioned    Home Living  Prior Function            PT Goals (current goals can now be found in the care plan section) Acute Rehab PT Goals PT Goal Formulation: With patient Time For Goal Achievement: 01/17/24 Potential to Achieve Goals: Fair Progress towards PT goals: Progressing toward goals    Frequency    Min 1X/week      PT Plan      Co-evaluation              AM-PAC PT "6 Clicks" Mobility   Outcome Measure  Help needed turning from your back to your side while in a flat bed  without using bedrails?: A Little Help needed moving from lying on your back to sitting on the side of a flat bed without using bedrails?: A Little Help needed moving to and from a bed to a chair (including a wheelchair)?: A Little Help needed standing up from a chair using your arms (e.g., wheelchair or bedside chair)?: A Little Help needed to walk in hospital room?: A Little Help needed climbing 3-5 steps with a railing? : A Lot 6 Click Score: 17    End of Session Equipment Utilized During Treatment: Gait belt Activity Tolerance: Patient tolerated treatment well;No increased pain;Patient limited by fatigue Patient left: in chair;with call bell/phone within reach;with chair alarm set Nurse Communication: Mobility status PT Visit Diagnosis: Other abnormalities of gait and mobility (R26.89);Muscle weakness (generalized) (M62.81)     Time: 7829-5621 PT Time Calculation (min) (ACUTE ONLY): 22 min  Charges:    $Therapeutic Activity: 8-22 mins PT General Charges $$ ACUTE PT VISIT: 1 Visit                     Marye Round, PT DPT Acute Rehabilitation Services Secure Chat Preferred  Office [phone]    Behr Cislo E Christain [place] 01/18/2024, 10:24 AM

## 2024-01-18 NOTE — Progress Notes (Signed)
   01/18/24 1628  Hand-off documentation  Hand-off Given Given to Transfer Unit/facility ([facility])  Report given to (Full Name) Trinna Post, RN  Check in  Check in process completed (see row info)  ([facility] to pick up pt for transport to facility)    Report given to RN in receiving facility. AVS and discharge documents placed in pt envelop and handed to [facility] for receiving facility. Tele removed and CCMD called. PICC line clean, dry, and intact. Pt transported off unit with PTAR and all personal belongings.

## 2024-01-18 NOTE — Discharge Summary (Signed)
Physician Discharge Summary  Patient ID: Christine Morse MRN: [id number] DOB/AGE: 1936-11-23 88 y.o.  Admit date: 11/23/2023 Discharge date: 01/18/2024  Admission Diagnoses: Duodenal adenoma Moderate protein calorie malnutrition Chronic blood loss anemia Gerd HOCM HTN Vit d deficiency  Discharge Diagnoses:  Principal Problem:   Duodenal adenocarcinoma (HCC) Active Problems:   GERD   Essential hypertension   Nonrheumatic aortic valve stenosis   Anemia due to multiple mechanisms   Positive fecal occult blood test   Protein-calorie malnutrition, severe   Delayed gastric emptying   Pancreatic fluid leak   Postprocedural intraabdominal abscess (HCC)   Hyperglycemia   Deep vein thrombosis (DVT) of right upper extremity (HCC)   Muscular deconditioning   Thrush   Hypertrophic obstructive cardiomyopathy (HCC)   Discharged Condition: stable  Hospital Course:  Patient was admitted to the ICU following diagnostic laparoscopy and Whipple on 11/23/2023.  She had marginal urine output initially and so albumin boluses were implemented.  She required some repletion of electrolytes initially.  Cardiology was following upfront as well due to her history of cardiomyopathy.  She also had some early delirium.  NG tube was taken out on postop day 3 and she did have nausea and vomiting intermittently.  Her pancreas was extremely soft intraoperatively and it was very difficult to get any sutures to hold, so it was not surprising when her drain output appeared milky on postop day 3.  Antibiotics were started.  She had an On-Q pain pump which was successful in helping control her pain for around 6 to 7 days.  Preoperatively her pathology had been only adenoma, but clinically she appeared to have carcinoma.  This was confirmed with her final pathology which was adenocarcinoma the duodenum, pT3 pN2 with positive vascular margin.  She continued to have issues with any significant oral intake and intermittent  nausea and vomiting so NG tube was replaced.  Trial of NG tube clamping and removal was performed multiple times.  However she continued to have nausea and vomiting with then around 2 days of NG tube clamping.  She was instituted on TPN early.  White count remained quite high and despite being afebrile a CT scan was ordered given that she continued to have nausea and vomiting.  She did have a fluid collection and a catheter was placed.  This had some yeast in it and so fluconazole was added to her antibiotic regimen.  Unfortunately, despite drain placement, her delayed gastric emptying did not improved.  We were able to put her on Reglan without demonstrating any significant change in her QTc interval and this did not seem to resolve her delayed gastric emptying.  She developed right upper extremity edema and was found to have a DVT associated with her right upper extremity PICC.  She was placed on treatment dose Lovenox.  Unsurprisingly, she did require some sliding scale insulin through TPN.  After numerous trials of NG tube clamping, discussion was had with interventional radiology and we were able to get a percutaneous G-tube placed.  This allowed the NG tube to come out.  The patient also developed a wound infection in a delayed fashion and had her upper midline wound opened.  This also had some pancreatic drainage.  The drains dried up a little bit at a time.  The percutaneous drain was removed first.  The right sided drain came down to nothing and this was removed.  The left sided drain also eventually stopped draining and this was pulled.  Her midline  ceased any significant drainage by the time of discharge.  She did require a blood transfusion after around 3 or 4 weeks postop.  This was a isolated event after continued drift.  She also had some bloody NG tube output associated with one of the NG tube replacements.  Finally in mid January, we were able to start clamping trials on the G-tube.  We  eventually were successful at getting the G-tube completely clamped once we are no longer having any pancreatic drainage.  However, her appetite continues to be extremely poor and the patient remains extremely weak.  Given the fact that it will likely take multiple weeks to get her anywhere close to having adequate oral intake to stop the TPN, the patient will go to LTAC.  It does appear that her leak is resolved and she is off all of her antibiotics.  She did end up having the right PICC come out and new PICC was placed on the left side.  Cardiology followed her initially and titrated up her metoprolol for her blood pressure and for her aortic stenosis and cardiomyopathy.  Palliative care input was requested to help with symptom control.  After the patient's NG tube came out, the patient did become more alert and energetic.  It also seemed to be beneficial to the patient once her drains are out.  Consults: cardiology, hematology/oncology, and IR  Significant Diagnostic Studies: labs: Most recent labs prior to discharge were from the 27th and showed glucose of 207, creatinine 0.44, albumin 2.7, mildly elevated AST and ALT.  CBC from the 25th showed hematocrit of 32.2 and a white count of 6.5. CT scan performed on January 2 showed resolution of fluid collections and small left pleural effusion that was stable.  Bilateral lower extremity duplex result is pending.  Patient remains on treatment dose Lovenox so this is mainly a question of following up resolution of DVT if there is one in it in the lower extremities.  Treatments: antibiotics: vancomycin, Zosyn, ceftriaxone, and metronidazole, cardiac meds: metoprolol and furosemide, anticoagulation: LMW heparin, insulin: , TPN, therapies: PT, OT, and Bath Aide, and surgery: whipple 11/23/23, perc drain 12/13, G tube 01/05/24  Discharge Exam: Blood pressure 137/65, pulse 67, temperature 97.8 F (36.6 C), temperature source Oral, resp. rate 18, height 4\' 10"   (1.473 m), weight 53.8 kg, SpO2 96%. General appearance: alert, cooperative, and frail Resp: breathing comfortably Cardio: regular rate and rhythm GI: soft, non distended, minimal drainage from upper abdominal wound with wet to dry dressing, g tube in place left abdomen. No drainage from prior drain sites. No cellulitis Extremities: BLE edema.   Disposition:  There are no questions and answers to display.        Discharge Instructions     Call MD for:  difficulty breathing, headache or visual disturbances   Complete by: As directed    Call MD for:  hives   Complete by: As directed    Call MD for:  persistant nausea and vomiting   Complete by: As directed    Call MD for:  redness, tenderness, or signs of infection (pain, swelling, redness, odor or green/yellow discharge around incision site)   Complete by: As directed    Call MD for:  severe uncontrolled pain   Complete by: As directed    Call MD for:  temperature >100.4   Complete by: As directed    Change dressing (specify)   Complete by: As directed    G tube care with flush  water 30-50 ml BID Wet to dry midline wound daily PICC care   Discharge diet:   Complete by: As directed    Full liquids and soft solids, increase SLOWLY to soft/regular low fat diet as tolerated.   Increase activity slowly   Complete by: As directed    TPN per pharmacy consult   Complete by: As directed       Allergies as of 01/18/2024       Reactions   Codeine         Medication List     STOP taking these medications    CALCIUM + D PO   ferrous sulfate 325 (65 FE) MG tablet   metoprolol succinate 25 MG 24 hr tablet Commonly known as: TOPROL-XL   rosuvastatin 5 MG tablet Commonly known as: CRESTOR       TAKE these medications    (feeding supplement) PROSource Plus liquid Take 30 mLs by mouth 2 (two) times daily between meals.   acetaminophen 500 MG tablet Commonly known as: TYLENOL Take 1,000 mg by mouth every 6 (six)  hours as needed for mild pain, moderate pain or headache.   enoxaparin 60 MG/0.6ML injection Commonly known as: LOVENOX Inject 0.5 mLs (50 mg total) into the skin every 12 (twelve) hours.   ipratropium-albuterol 0.5-2.5 (3) MG/3ML Soln Commonly known as: DUONEB Take 3 mLs by nebulization every 6 (six) hours as needed.   megestrol 400 MG/10ML suspension Commonly known as: MEGACE Place 10 mLs (400 mg total) into feeding tube daily.   metoCLOPramide 5 MG tablet Commonly known as: REGLAN Take 1 tablet (5 mg total) by mouth 4 (four) times daily -  before meals and at bedtime.   metoprolol tartrate 100 MG tablet Commonly known as: LOPRESSOR Take 1 tablet (100 mg total) by mouth 2 (two) times daily.   multivitamin with minerals Tabs tablet Take 1 tablet by mouth daily. Start taking on: January 19, 2024   nystatin 100000 UNIT/ML suspension Commonly known as: MYCOSTATIN Take 5 mLs (500,000 Units total) by mouth 4 (four) times daily.   ondansetron 4 MG tablet Commonly known as: Zofran Take 1 tablet (4 mg total) by mouth every 8 (eight) hours as needed for nausea or vomiting.   pantoprazole 40 MG tablet Commonly known as: Protonix Take 1 tablet (40 mg total) by mouth 2 (two) times daily.   prochlorperazine 10 MG tablet Commonly known as: COMPAZINE Take 1 tablet (10 mg total) by mouth every 6 (six) hours as needed for nausea or vomiting (Use for nausea and / or vomiting unresolved with ondansetron (Zofran).).   sucralfate 1 GM/10ML suspension Commonly known as: CARAFATE Take 10 mLs (1 g total) by mouth 3 (three) times daily.               Discharge Care Instructions  (From admission, onward)           Start     Ordered   01/18/24 0000  Change dressing (specify)       Comments: G tube care with flush water 30-50 ml BID Wet to dry midline wound daily PICC care   01/18/24 1044            Follow-up Information     Almond Lint, MD Follow up in 3 week(s).    Specialty: General Surgery Contact information: [address] [place] [address] [place] [address] [phone]                 Signed: Darrick Huntsman  Christine Morse 01/18/2024, 1:08 PM

## 2024-01-18 NOTE — Progress Notes (Signed)
Daily Progress Note   Patient Name: Christine Morse       Date: 01/18/2024 DOB: [birth year]  Age: 88 y.o. MRN#: [id number] Attending Physician: Almond Lint, MD Primary Care Physician: [name], MD Admit Date: 11/23/2023  Reason for Consultation/Follow-up: Establishing goals of care  Subjective: I have reviewed medical records including EPIC notes, MAR, any available advanced directives as necessary, and labs.  Noted patient for possible discharge today.  Received report from primary RN -no acute concerns.   Received updates from [facility] -patient set for discharge to [facility] today.  Went to visit patient at bedside -daughter/Heidi present.  Patient was sitting up in chair awake, alert, oriented, and able to participate in conversation; though she is very weak and frail appearing.  Emotional support provided to patient and family.  Reviewed with patient that, unfortunately, I was not able to connect with her daughter/Beverly yesterday to schedule meeting.  Discussed this in context of her discharge today.  Outpatient palliative care reviewed and offered for continued goals of care after discharge - patient and family accepting.  Reviewed that patient is at high risk for continued decline and poor prognosis due to poor oral intake.  All questions and concerns addressed. Encouraged to call with questions and/or concerns. PMT card previously provided.  Length of Stay: 56  Current Medications: Scheduled Meds:   (feeding supplement) PROSource Plus  30 mL Oral BID BM   Chlorhexidine Gluconate Cloth  6 each Topical Q0600   enoxaparin (LOVENOX) injection  50 mg Subcutaneous Q12H   megestrol  400 mg Per Tube Daily   metoCLOPramide  5 mg Oral TID AC & HS   metoprolol tartrate  100 mg Oral BID    multivitamin with minerals  1 tablet Oral Daily   nystatin  5 mL Oral QID   pantoprazole  40 mg Oral BID   sodium chloride flush  5 mL Intracatheter Q8H   sucralfate  1 g Oral TID    Continuous Infusions:  TPN CYCLIC-ADULT (ION)      PRN Meds: acetaminophen, haloperidol lactate, ipratropium-albuterol, ondansetron (ZOFRAN) IV, mouth rinse, prochlorperazine **OR** prochlorperazine, sodium chloride flush  Physical Exam Vitals and nursing note reviewed.  Constitutional:      General: She is not in acute distress.    Appearance: She is ill-appearing.  Pulmonary:  Effort: No respiratory distress.  Skin:    General: Skin is warm and dry.  Neurological:     Mental Status: She is alert and oriented to person, place, and time.     Motor: Weakness present.  Psychiatric:        Attention and Perception: Attention normal.        Mood and Affect: Affect is flat.        Behavior: Behavior is slowed. Behavior is cooperative.        Cognition and Memory: Cognition and memory normal.             Vital Signs: BP 137/65 (BP Location: Right Arm)   Pulse 67   Temp 97.8 F (36.6 C) (Oral)   Resp 18   Ht 4\' 10"  (1.473 m)   Wt 53.8 kg   SpO2 96%   BMI 24.77 kg/m  SpO2: SpO2: 96 % O2 Device: O2 Device: Room Air O2 Flow Rate: O2 Flow Rate (L/min): 2 L/min  Intake/output summary:  Intake/Output Summary (Last 24 hours) at 01/18/2024 1227 Last data filed at 01/18/2024 1191 Gross per 24 hour  Intake 174.21 ml  Output --  Net 174.21 ml   LBM: Last BM Date : 01/17/23 Baseline Weight: Weight: 55.3 kg Most recent weight: Weight: 53.8 kg       Palliative Assessment/Data: PPS 50%      Patient Active Problem List   Diagnosis Date Noted   Delayed gastric emptying 01/18/2024   Pancreatic fluid leak 01/18/2024   Postprocedural intraabdominal abscess (HCC) 01/18/2024   Hyperglycemia 01/18/2024   Deep vein thrombosis (DVT) of right upper extremity (HCC) 01/18/2024   Muscular  deconditioning 01/18/2024   Thrush 01/18/2024   Hypertrophic obstructive cardiomyopathy (HCC) 01/18/2024   Protein-calorie malnutrition, severe 12/01/2023   Upper GI bleed 11/08/2023   Hyponatremia 11/07/2023   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   Positive fecal occult blood test 10/26/2023   Duodenal mass 10/25/2023   Anemia due to multiple mechanisms 10/24/2023   Nonrheumatic aortic valve stenosis 07/30/2022   Abnormal stress test 07/30/2022   Pyelonephritis 09/19/2017   Essential hypertension 09/19/2017   Hyperlipidemia 09/19/2017   Pulmonary vascular congestion 09/19/2017   Cameron ulcer 10/05/2013   Iron deficiency anemia due to chronic blood loss 10/04/2013   Other specified gastritis without mention of hemorrhage 10/04/2013   Chest tightness 10/03/2013   Vitamin D deficiency 04/08/2009   GERD 03/26/2009   Diverticulosis of colon 03/26/2009   DIARRHEA 03/26/2009   NEPHROLITHIASIS, HX OF 03/26/2009    Palliative Care Assessment & Plan   Patient Profile: 88 y.o. female  with past medical history of chronic blood loss anemia, aortic stenosis, HTN, HLD, GERD, diverticular disease, and recent diagnosis of duodenal mass suspected to be invasive cancer with plans for a Whipple procedure admitted on 11/23/2023 with shortness of breath with little exertion and lower extremity edema.    Patient had Whipple Procedure 11/23/2023 (POD 40). Final pathology - adenocarcinoma duodenum, positive margin, positive LN - [id number]. Since then she has had delayed gastric emptying and a pancreatic leak. She currently has a NG tube in place. She is on TPN.  Assessment: Principal Problem:   Duodenal adenocarcinoma (HCC) Active Problems:   GERD   Essential hypertension   Nonrheumatic aortic valve stenosis   Anemia due to multiple mechanisms   Positive fecal occult blood test   Protein-calorie malnutrition, severe   Delayed gastric emptying   Pancreatic fluid leak   Postprocedural  intraabdominal abscess (HCC)  Hyperglycemia   Deep vein thrombosis (DVT) of right upper extremity (HCC)   Muscular deconditioning   Thrush   Hypertrophic obstructive cardiomyopathy (HCC)   Concern about end of life  Recommendations/Plan: Continue full scope plan of care Continue DNR/DNI as previously documented Plan is for discharge today to [facility] with outpatient palliative care to follow [facility] notified and consulted for: Outpatient palliative care referral PMT will continue to follow peripherally. If there are any imminent needs please call the service directly  Goals of Care and Additional Recommendations: Limitations on Scope of Treatment: Full Scope Treatment and No Tracheostomy  Code Status:    Code Status Orders  (From admission, onward)           Start     Ordered   01/04/24 1259  Do not attempt resuscitation (DNR)- Limited -Do Not Intubate (DNI)  (Code Status)  Continuous       Question Answer Comment  If pulseless and not breathing No CPR or chest compressions.   In Pre-Arrest Conditions (Patient Is Breathing and Has A Pulse) Do not intubate. Provide all appropriate non-invasive medical interventions. Avoid ICU transfer unless indicated or required.   Consent: Discussion documented in EHR or advanced directives reviewed      01/04/24 1259           Code Status History     Date Active Date Inactive Code Status Order ID Comments User Context   11/23/2023 1654 01/04/2024 1259 Full Code [id number]  Almond Lint, MD Inpatient   11/07/2023 2346 11/08/2023 1709 Full Code [id number]  Rometta Emery, MD ED   10/24/2023 0232 10/27/2023 1959 Full Code [id number]  Darlin Drop, DO ED   09/19/2017 0332 09/20/2017 1616 Full Code [id number]  Bobette Mo, MD ED   10/04/2013 0102 10/05/2013 1249 Full Code [id number]  Hillary Bow, DO Inpatient      Advance Directive Documentation    Flowsheet Row Most Recent Value  Type of Advance Directive Healthcare Power of  Attorney, Living will  Pre-existing out of facility DNR order (yellow form or pink MOST form) --  "MOST" Form in Place? --       Prognosis:  Overall poor in the setting of advanced age, recurrent hospitalizations, decreased oral intake, and multiple comorbidities   Discharge Planning: [facility] with outpatient palliative care  Care plan was discussed with patient, daughter, primary RN, TOC  Thank you for allowing the Palliative Medicine Team to assist in the care of this patient.   Total Time 35 minutes Prolonged Time Billed  no       Haskel Khan, NP  Please contact Palliative Medicine Team phone at [phone] for questions and concerns.   *Portions of this note are a verbal dictation therefore any spelling and/or grammatical errors are due to the "[employer]agon [employer] One" system interpretation.

## 2024-01-18 NOTE — Progress Notes (Signed)
Occupational Therapy Treatment Patient Details Name: Christine Morse MRN: [id number] DOB: June 04, 1936 Today's Date: 01/18/2024   History of present illness Patient is an 88 y/o female admitted 11/23/23 with diagnosis of duodenal adenoma and underwent laparoscopic Whipple procedure. post-op subcapsular fluid collection around the anterior aspect of the left liver lobe. Drain placement in IR 12/03/23. NGT inserted 12/15, dislodged then replaced.  Positive for R subclavian and axillary DVT on 12/07/23. 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   OT comments  Goals updated this session. Pt had previously walked with PT today and so session focused on activity tolerance during meals. OT and daughter encouraging Pt to eat as much as possible. Pt also provided with new theraband (level 1) and able to perform UB and LB exercises (see below) Ot also focused on parts of occupational profile with patient really trying to figure out what motivates her. The 2 big answers were her dog "Toby" a Binnie Kand and her Jerl Mina who is about to graduate HS and go to college. She really wants to be at her high school graduation. SO OT focused on encouraging her by telling her every time that she walks, or stands at the sink for ADL, every time she does her exercises, it is all building up so that she can achieve her goal. Also educated Pt on spreading out exercises "do one rep at each joint every time a commercial comes on). Strategies to allow her to do more and not feel as fatigued. Pt and daughter verbalized understanding. OT will continue to follow acutely, and Pt continues to require post-acute rehab.       If plan is discharge home, recommend the following:  A lot of help with bathing/dressing/bathroom;A little help with walking and/or transfers;Assist for transportation;Assistance with cooking/housework;Help with stairs or ramp for entrance   Equipment Recommendations   Other (comment) (defer to next venue of care)    Recommendations for Other Services      Precautions / Restrictions Precautions Precautions: Fall Precaution Comments: drains removed Restrictions Weight Bearing Restrictions Per Provider Order: No       Mobility Bed Mobility               General bed mobility comments: up in chair    Transfers                   General transfer comment: declined transfer at this time     Balance Overall balance assessment: Needs assistance Sitting-balance support: Feet supported Sitting balance-[name]: Fair                                     ADL either performed or assessed with clinical judgement   ADL Overall ADL's : Needs assistance/impaired Eating/Feeding: Supervision/ safety;Sitting Eating/Feeding Details (indicate cue type and reason): Pt fatigues with sustained eating. On top of not caring for the food, her arms get tired. Today she was able to feed herself x7 bites of jello. Grooming: Therapist, nutritional;Set up;Sitting Grooming Details (indicate cue type and reason): in recliner, declined sink as she walked with PT earlier                                    Extremity/Trunk Assessment Upper Extremity Assessment Upper Extremity Assessment: Generalized weakness   Lower Extremity  Assessment Lower Extremity Assessment:  (BLE edema)        Vision       Perception     Praxis      Cognition Arousal: Alert Behavior During Therapy: WFL for tasks assessed/performed Overall Cognitive Status: Within Functional Limits for tasks assessed (suspect baseline) Area of Impairment: Memory                     Memory: Decreased short-term memory       Problem Solving: Slow processing          Exercises Exercises: General Upper Extremity General Exercises - Upper Extremity Shoulder Flexion: AROM, Both, 10 reps, Seated Shoulder ABduction: AROM, Both, 10 reps, Seated Shoulder  Horizontal ABduction: AROM, Both, 10 reps, Seated Elbow Flexion: Strengthening, Both, 10 reps, Seated, Theraband Theraband Level (Elbow Flexion): Level 1 (Yellow) Elbow Extension: Strengthening, Both, 10 reps, Seated, Theraband Theraband Level (Elbow Extension): Level 1 (Yellow) General Exercises - Lower Extremity Ankle Circles/Pumps: AROM, Both, Seated, 10 reps Long Arc Quad: AROM, 10 reps, Both, Seated Hip Flexion/Marching: AROM, Both, 5 reps, Seated    Shoulder Instructions       General Comments VSS on RA, daughter present and providing encouragement    Pertinent Vitals/ Pain       Pain Assessment Pain Assessment: Faces Faces Pain Scale: Hurts a little bit Pain Location: abdomen Pain Descriptors / Indicators: Discomfort, Grimacing, Guarding Pain Intervention(s): Limited activity within patient's tolerance, Monitored during session, Repositioned  Home Living                                          Prior Functioning/Environment              Frequency  Min 1X/week        Progress Toward Goals  OT Goals(current goals can now be found in the care plan section)  Progress towards OT goals: Progressing toward goals;Goals updated  Acute Rehab OT Goals Patient Stated Goal: be able to attend her great-grandaughters graduation OT Goal Formulation: With patient/family Time For Goal Achievement: 02/01/24 Potential to Achieve Goals: Good  Plan      Co-evaluation                 AM-PAC OT "6 Clicks" Daily Activity     Outcome Measure   Help from another person eating meals?: A Little Help from another person taking care of personal grooming?: A Little Help from another person toileting, which includes using toliet, bedpan, or urinal?: A Lot Help from another person bathing (including washing, rinsing, drying)?: A Lot Help from another person to put on and taking off regular upper body clothing?: A Little Help from another person to put on  and taking off regular lower body clothing?: A Lot 6 Click Score: 15    End of Session    OT Visit Diagnosis: Unsteadiness on feet (R26.81);Muscle weakness (generalized) (M62.81)   Activity Tolerance Patient limited by fatigue   Patient Left in chair;with call bell/phone within reach;with chair alarm set;with family/visitor present   Nurse Communication Mobility status        Time: 1203-1229 OT Time Calculation (min): 26 min  Charges: OT General Charges $OT Visit: 1 Visit OT Treatments $Self Care/Home Management : 8-22 mins $Therapeutic Exercise: 8-22 mins  Nyoka Cowden OTR/L Acute Rehabilitation Services Office: [phone]  Evern Bio [facility] 01/18/2024, 12:49 PM

## 2024-01-18 NOTE — Consult Note (Signed)
[facility] Liaison Consult Note   01/18/2024  Christine Morse 09-23-1936 [id number]  Primary Care Provider: [name], MD  Insurance:  Medicare ACO REACH  Follow up:  LLOS 56 days with extreme high risk scores for unplanned readmission  LOC: Progressive  PT/OT, Palliative Care, and inpatient TOC progress notes reviewed for disposition needs.  Patient's electronic medical record reviewed for potential post hospital care needs.  Plan:  Continue to follow currently disposition is not clear when reviewing notes.  Charlesetta Shanks, RN, BSN, CCM [employer], [facility] Liaison Direct Dial: [phone] or secure chat Email: [email or web address]

## 2024-01-20 DIAGNOSIS — R079 Chest pain, unspecified: Secondary | ICD-10-CM

## 2024-01-21 DIAGNOSIS — T884XXA Failed or difficult intubation, initial encounter: Secondary | ICD-10-CM

## 2024-01-21 DIAGNOSIS — J9621 Acute and chronic respiratory failure with hypoxia: Secondary | ICD-10-CM

## 2024-01-21 DIAGNOSIS — J189 Pneumonia, unspecified organism: Secondary | ICD-10-CM

## 2024-01-21 DIAGNOSIS — G931 Anoxic brain damage, not elsewhere classified: Secondary | ICD-10-CM

## 2024-01-22 DIAGNOSIS — J189 Pneumonia, unspecified organism: Secondary | ICD-10-CM

## 2024-01-22 DIAGNOSIS — T884XXA Failed or difficult intubation, initial encounter: Secondary | ICD-10-CM

## 2024-01-22 DIAGNOSIS — J9621 Acute and chronic respiratory failure with hypoxia: Secondary | ICD-10-CM

## 2024-01-22 DIAGNOSIS — G931 Anoxic brain damage, not elsewhere classified: Secondary | ICD-10-CM

## 2024-01-23 DIAGNOSIS — J9621 Acute and chronic respiratory failure with hypoxia: Secondary | ICD-10-CM

## 2024-01-23 DIAGNOSIS — R6521 Severe sepsis with septic shock: Secondary | ICD-10-CM

## 2024-01-23 DIAGNOSIS — J690 Pneumonitis due to inhalation of food and vomit: Secondary | ICD-10-CM

## 2024-01-23 DIAGNOSIS — G931 Anoxic brain damage, not elsewhere classified: Secondary | ICD-10-CM

## 2024-02-19 DEATH — deceased

## 2024-03-01 ENCOUNTER — Encounter: Payer: Self-pay | Admitting: Hematology

## 2024-07-04 ENCOUNTER — Other Ambulatory Visit (HOSPITAL_BASED_OUTPATIENT_CLINIC_OR_DEPARTMENT_OTHER): Payer: Self-pay
